# Patient Record
Sex: Female | Born: 1965 | Race: Black or African American | Hispanic: No | Marital: Single | Smoking: Former smoker
Health system: Southern US, Community
[De-identification: ages and names within clinical notes are randomized; demographics above are authoritative.]

## PROBLEM LIST (undated history)

## (undated) DIAGNOSIS — Z9889 Other specified postprocedural states: Secondary | ICD-10-CM

## (undated) DIAGNOSIS — F32A Depression, unspecified: Secondary | ICD-10-CM

## (undated) DIAGNOSIS — M21949 Unspecified acquired deformity of hand, unspecified hand: Secondary | ICD-10-CM

## (undated) DIAGNOSIS — L509 Urticaria, unspecified: Secondary | ICD-10-CM

## (undated) DIAGNOSIS — R112 Nausea with vomiting, unspecified: Secondary | ICD-10-CM

## (undated) DIAGNOSIS — R600 Localized edema: Secondary | ICD-10-CM

## (undated) DIAGNOSIS — Q6689 Other  specified congenital deformities of feet: Secondary | ICD-10-CM

## (undated) DIAGNOSIS — Z889 Allergy status to unspecified drugs, medicaments and biological substances status: Secondary | ICD-10-CM

## (undated) DIAGNOSIS — S143XXA Injury of brachial plexus, initial encounter: Secondary | ICD-10-CM

## (undated) DIAGNOSIS — E785 Hyperlipidemia, unspecified: Secondary | ICD-10-CM

## (undated) DIAGNOSIS — Z972 Presence of dental prosthetic device (complete) (partial): Secondary | ICD-10-CM

## (undated) DIAGNOSIS — L309 Dermatitis, unspecified: Secondary | ICD-10-CM

## (undated) DIAGNOSIS — I2510 Atherosclerotic heart disease of native coronary artery without angina pectoris: Secondary | ICD-10-CM

## (undated) DIAGNOSIS — F419 Anxiety disorder, unspecified: Secondary | ICD-10-CM

## (undated) DIAGNOSIS — F329 Major depressive disorder, single episode, unspecified: Secondary | ICD-10-CM

## (undated) DIAGNOSIS — M5090 Cervical disc disorder, unspecified, unspecified cervical region: Secondary | ICD-10-CM

## (undated) DIAGNOSIS — I10 Essential (primary) hypertension: Secondary | ICD-10-CM

## (undated) DIAGNOSIS — G8929 Other chronic pain: Secondary | ICD-10-CM

## (undated) HISTORY — PX: MULTIPLE TOOTH EXTRACTIONS: SHX2053

## (undated) HISTORY — PX: CARDIAC CATHETERIZATION: SHX172

## (undated) HISTORY — PX: RIB RESECTION: SHX5077

## (undated) HISTORY — PX: SHOULDER SURGERY: SHX246

## (undated) HISTORY — PX: BRACHIAL PLEXUS EXPLORATION: SHX1261

## (undated) HISTORY — PX: TOOTH EXTRACTION: SUR596

## (undated) HISTORY — DX: Urticaria, unspecified: L50.9

## (undated) HISTORY — DX: Dermatitis, unspecified: L30.9

---

## 1998-04-26 ENCOUNTER — Ambulatory Visit (HOSPITAL_COMMUNITY): Admission: RE | Admit: 1998-04-26 | Discharge: 1998-04-26 | Payer: Self-pay | Admitting: Orthopedic Surgery

## 1998-06-22 ENCOUNTER — Inpatient Hospital Stay (HOSPITAL_COMMUNITY): Admission: RE | Admit: 1998-06-22 | Discharge: 1998-06-23 | Payer: Self-pay | Admitting: Neurosurgery

## 1998-06-22 ENCOUNTER — Encounter: Payer: Self-pay | Admitting: Neurosurgery

## 1998-09-11 HISTORY — PX: OVARIAN CYST REMOVAL: SHX89

## 2000-05-17 ENCOUNTER — Emergency Department (HOSPITAL_COMMUNITY): Admission: EM | Admit: 2000-05-17 | Discharge: 2000-05-17 | Payer: Self-pay | Admitting: Emergency Medicine

## 2000-06-17 ENCOUNTER — Emergency Department (HOSPITAL_COMMUNITY): Admission: EM | Admit: 2000-06-17 | Discharge: 2000-06-17 | Payer: Self-pay | Admitting: Emergency Medicine

## 2000-06-24 ENCOUNTER — Emergency Department (HOSPITAL_COMMUNITY): Admission: EM | Admit: 2000-06-24 | Discharge: 2000-06-24 | Payer: Self-pay | Admitting: Emergency Medicine

## 2000-06-24 ENCOUNTER — Encounter: Payer: Self-pay | Admitting: Emergency Medicine

## 2000-08-14 ENCOUNTER — Emergency Department (HOSPITAL_COMMUNITY): Admission: EM | Admit: 2000-08-14 | Discharge: 2000-08-14 | Payer: Self-pay | Admitting: Emergency Medicine

## 2001-06-04 ENCOUNTER — Emergency Department (HOSPITAL_COMMUNITY): Admission: EM | Admit: 2001-06-04 | Discharge: 2001-06-04 | Payer: Self-pay | Admitting: Emergency Medicine

## 2002-01-04 ENCOUNTER — Emergency Department (HOSPITAL_COMMUNITY): Admission: EM | Admit: 2002-01-04 | Discharge: 2002-01-04 | Payer: Self-pay | Admitting: Emergency Medicine

## 2002-01-04 ENCOUNTER — Encounter: Payer: Self-pay | Admitting: Emergency Medicine

## 2002-01-23 ENCOUNTER — Emergency Department (HOSPITAL_COMMUNITY): Admission: EM | Admit: 2002-01-23 | Discharge: 2002-01-23 | Payer: Self-pay

## 2002-05-15 ENCOUNTER — Encounter: Admission: RE | Admit: 2002-05-15 | Discharge: 2002-05-15 | Payer: Self-pay | Admitting: Obstetrics and Gynecology

## 2002-05-19 ENCOUNTER — Ambulatory Visit (HOSPITAL_COMMUNITY): Admission: RE | Admit: 2002-05-19 | Discharge: 2002-05-19 | Payer: Self-pay

## 2002-06-05 ENCOUNTER — Encounter: Admission: RE | Admit: 2002-06-05 | Discharge: 2002-06-05 | Payer: Self-pay | Admitting: Obstetrics and Gynecology

## 2002-07-10 ENCOUNTER — Encounter: Admission: RE | Admit: 2002-07-10 | Discharge: 2002-07-10 | Payer: Self-pay | Admitting: Obstetrics and Gynecology

## 2002-07-23 ENCOUNTER — Ambulatory Visit (HOSPITAL_COMMUNITY): Admission: RE | Admit: 2002-07-23 | Discharge: 2002-07-23 | Payer: Self-pay | Admitting: Obstetrics and Gynecology

## 2002-10-20 ENCOUNTER — Emergency Department (HOSPITAL_COMMUNITY): Admission: EM | Admit: 2002-10-20 | Discharge: 2002-10-20 | Payer: Self-pay | Admitting: Emergency Medicine

## 2002-11-03 ENCOUNTER — Encounter: Payer: Self-pay | Admitting: Emergency Medicine

## 2002-11-03 ENCOUNTER — Emergency Department (HOSPITAL_COMMUNITY): Admission: EM | Admit: 2002-11-03 | Discharge: 2002-11-03 | Payer: Self-pay | Admitting: Emergency Medicine

## 2002-12-10 ENCOUNTER — Emergency Department (HOSPITAL_COMMUNITY): Admission: EM | Admit: 2002-12-10 | Discharge: 2002-12-10 | Payer: Self-pay | Admitting: Emergency Medicine

## 2002-12-30 ENCOUNTER — Encounter: Payer: Self-pay | Admitting: Family Medicine

## 2002-12-30 ENCOUNTER — Ambulatory Visit (HOSPITAL_COMMUNITY): Admission: RE | Admit: 2002-12-30 | Discharge: 2002-12-30 | Payer: Self-pay | Admitting: Family Medicine

## 2003-02-23 ENCOUNTER — Encounter: Payer: Self-pay | Admitting: Family Medicine

## 2003-02-23 ENCOUNTER — Ambulatory Visit (HOSPITAL_COMMUNITY): Admission: RE | Admit: 2003-02-23 | Discharge: 2003-02-23 | Payer: Self-pay | Admitting: Family Medicine

## 2003-06-27 ENCOUNTER — Emergency Department (HOSPITAL_COMMUNITY): Admission: AD | Admit: 2003-06-27 | Discharge: 2003-06-27 | Payer: Self-pay | Admitting: Emergency Medicine

## 2003-07-25 ENCOUNTER — Emergency Department (HOSPITAL_COMMUNITY): Admission: EM | Admit: 2003-07-25 | Discharge: 2003-07-25 | Payer: Self-pay | Admitting: Emergency Medicine

## 2004-04-01 ENCOUNTER — Emergency Department (HOSPITAL_COMMUNITY): Admission: EM | Admit: 2004-04-01 | Discharge: 2004-04-01 | Payer: Self-pay | Admitting: *Deleted

## 2004-04-12 ENCOUNTER — Emergency Department (HOSPITAL_COMMUNITY): Admission: EM | Admit: 2004-04-12 | Discharge: 2004-04-13 | Payer: Self-pay | Admitting: Emergency Medicine

## 2004-05-23 ENCOUNTER — Ambulatory Visit (HOSPITAL_COMMUNITY): Admission: RE | Admit: 2004-05-23 | Discharge: 2004-05-23 | Payer: Self-pay | Admitting: Neurosurgery

## 2004-05-26 ENCOUNTER — Emergency Department (HOSPITAL_COMMUNITY): Admission: EM | Admit: 2004-05-26 | Discharge: 2004-05-26 | Payer: Self-pay | Admitting: Emergency Medicine

## 2004-06-10 ENCOUNTER — Encounter: Admission: RE | Admit: 2004-06-10 | Discharge: 2004-09-08 | Payer: Self-pay | Admitting: Neurosurgery

## 2004-06-21 ENCOUNTER — Ambulatory Visit: Payer: Self-pay | Admitting: Obstetrics and Gynecology

## 2005-01-02 ENCOUNTER — Ambulatory Visit: Payer: Self-pay | Admitting: Internal Medicine

## 2005-01-09 ENCOUNTER — Ambulatory Visit: Payer: Self-pay | Admitting: Family Medicine

## 2005-01-10 ENCOUNTER — Ambulatory Visit (HOSPITAL_COMMUNITY): Admission: RE | Admit: 2005-01-10 | Discharge: 2005-01-10 | Payer: Self-pay | Admitting: Family Medicine

## 2005-01-10 ENCOUNTER — Ambulatory Visit: Payer: Self-pay | Admitting: *Deleted

## 2005-02-14 ENCOUNTER — Ambulatory Visit: Payer: Self-pay | Admitting: Family Medicine

## 2005-02-21 ENCOUNTER — Ambulatory Visit (HOSPITAL_COMMUNITY): Admission: RE | Admit: 2005-02-21 | Discharge: 2005-02-21 | Payer: Self-pay | Admitting: Family Medicine

## 2005-02-25 ENCOUNTER — Emergency Department (HOSPITAL_COMMUNITY): Admission: EM | Admit: 2005-02-25 | Discharge: 2005-02-25 | Payer: Self-pay | Admitting: Family Medicine

## 2005-03-28 ENCOUNTER — Ambulatory Visit: Payer: Self-pay | Admitting: Family Medicine

## 2005-04-26 ENCOUNTER — Emergency Department (HOSPITAL_COMMUNITY): Admission: EM | Admit: 2005-04-26 | Discharge: 2005-04-26 | Payer: Self-pay | Admitting: Emergency Medicine

## 2005-04-27 ENCOUNTER — Ambulatory Visit: Payer: Self-pay | Admitting: Family Medicine

## 2005-04-28 ENCOUNTER — Ambulatory Visit: Payer: Self-pay | Admitting: Family Medicine

## 2005-04-28 ENCOUNTER — Ambulatory Visit (HOSPITAL_COMMUNITY): Admission: RE | Admit: 2005-04-28 | Discharge: 2005-04-28 | Payer: Self-pay | Admitting: Family Medicine

## 2005-04-29 ENCOUNTER — Emergency Department (HOSPITAL_COMMUNITY): Admission: EM | Admit: 2005-04-29 | Discharge: 2005-04-29 | Payer: Self-pay | Admitting: Emergency Medicine

## 2005-05-03 ENCOUNTER — Ambulatory Visit: Payer: Self-pay | Admitting: Family Medicine

## 2005-05-05 ENCOUNTER — Ambulatory Visit: Payer: Self-pay | Admitting: Family Medicine

## 2005-05-18 ENCOUNTER — Ambulatory Visit (HOSPITAL_COMMUNITY): Admission: RE | Admit: 2005-05-18 | Discharge: 2005-05-18 | Payer: Self-pay | Admitting: Family Medicine

## 2005-05-19 ENCOUNTER — Ambulatory Visit: Payer: Self-pay | Admitting: Family Medicine

## 2005-05-24 ENCOUNTER — Ambulatory Visit: Payer: Self-pay | Admitting: Family Medicine

## 2005-09-13 ENCOUNTER — Ambulatory Visit: Payer: Self-pay | Admitting: Family Medicine

## 2005-10-22 ENCOUNTER — Ambulatory Visit (HOSPITAL_COMMUNITY): Admission: RE | Admit: 2005-10-22 | Discharge: 2005-10-22 | Payer: Self-pay | Admitting: Neurosurgery

## 2005-11-10 ENCOUNTER — Ambulatory Visit: Payer: Self-pay | Admitting: Family Medicine

## 2005-11-14 ENCOUNTER — Ambulatory Visit (HOSPITAL_COMMUNITY): Admission: RE | Admit: 2005-11-14 | Discharge: 2005-11-14 | Payer: Self-pay | Admitting: Family Medicine

## 2005-12-20 ENCOUNTER — Emergency Department (HOSPITAL_COMMUNITY): Admission: EM | Admit: 2005-12-20 | Discharge: 2005-12-21 | Payer: Self-pay | Admitting: Emergency Medicine

## 2005-12-27 ENCOUNTER — Ambulatory Visit: Payer: Self-pay | Admitting: Family Medicine

## 2006-01-09 ENCOUNTER — Encounter: Admission: RE | Admit: 2006-01-09 | Discharge: 2006-02-01 | Payer: Self-pay | Admitting: Family Medicine

## 2006-02-01 ENCOUNTER — Ambulatory Visit: Payer: Self-pay | Admitting: Family Medicine

## 2006-03-02 ENCOUNTER — Ambulatory Visit: Payer: Self-pay | Admitting: Family Medicine

## 2006-05-29 ENCOUNTER — Encounter: Admission: RE | Admit: 2006-05-29 | Discharge: 2006-05-29 | Payer: Self-pay | Admitting: Family Medicine

## 2006-06-26 ENCOUNTER — Encounter
Admission: RE | Admit: 2006-06-26 | Discharge: 2006-09-24 | Payer: Self-pay | Admitting: Physical Medicine & Rehabilitation

## 2006-06-26 ENCOUNTER — Ambulatory Visit: Payer: Self-pay | Admitting: Physical Medicine & Rehabilitation

## 2006-07-24 ENCOUNTER — Ambulatory Visit: Payer: Self-pay | Admitting: Physical Medicine & Rehabilitation

## 2006-08-21 ENCOUNTER — Ambulatory Visit: Payer: Self-pay | Admitting: Family Medicine

## 2006-09-19 ENCOUNTER — Ambulatory Visit: Payer: Self-pay | Admitting: Physical Medicine & Rehabilitation

## 2006-10-03 ENCOUNTER — Emergency Department (HOSPITAL_COMMUNITY): Admission: EM | Admit: 2006-10-03 | Discharge: 2006-10-04 | Payer: Self-pay | Admitting: Emergency Medicine

## 2006-10-09 ENCOUNTER — Ambulatory Visit: Payer: Self-pay | Admitting: Family Medicine

## 2006-11-14 ENCOUNTER — Encounter
Admission: RE | Admit: 2006-11-14 | Discharge: 2007-02-12 | Payer: Self-pay | Admitting: Physical Medicine & Rehabilitation

## 2006-11-14 ENCOUNTER — Ambulatory Visit: Payer: Self-pay | Admitting: Physical Medicine & Rehabilitation

## 2007-02-08 ENCOUNTER — Encounter
Admission: RE | Admit: 2007-02-08 | Discharge: 2007-05-09 | Payer: Self-pay | Admitting: Physical Medicine & Rehabilitation

## 2007-02-08 ENCOUNTER — Ambulatory Visit: Payer: Self-pay | Admitting: Physical Medicine & Rehabilitation

## 2007-03-26 DIAGNOSIS — D259 Leiomyoma of uterus, unspecified: Secondary | ICD-10-CM

## 2007-03-26 DIAGNOSIS — G43009 Migraine without aura, not intractable, without status migrainosus: Secondary | ICD-10-CM | POA: Insufficient documentation

## 2007-03-26 DIAGNOSIS — N83209 Unspecified ovarian cyst, unspecified side: Secondary | ICD-10-CM

## 2007-03-26 DIAGNOSIS — Q765 Cervical rib: Secondary | ICD-10-CM | POA: Insufficient documentation

## 2007-03-26 DIAGNOSIS — M5030 Other cervical disc degeneration, unspecified cervical region: Secondary | ICD-10-CM

## 2007-03-26 DIAGNOSIS — F17200 Nicotine dependence, unspecified, uncomplicated: Secondary | ICD-10-CM | POA: Insufficient documentation

## 2007-03-26 DIAGNOSIS — M5137 Other intervertebral disc degeneration, lumbosacral region: Secondary | ICD-10-CM

## 2007-03-28 ENCOUNTER — Emergency Department (HOSPITAL_COMMUNITY): Admission: EM | Admit: 2007-03-28 | Discharge: 2007-03-28 | Payer: Self-pay | Admitting: Emergency Medicine

## 2007-04-15 ENCOUNTER — Ambulatory Visit: Payer: Self-pay | Admitting: Family Medicine

## 2007-04-18 ENCOUNTER — Ambulatory Visit (HOSPITAL_COMMUNITY): Admission: RE | Admit: 2007-04-18 | Discharge: 2007-04-18 | Payer: Self-pay | Admitting: Family Medicine

## 2007-04-23 ENCOUNTER — Emergency Department (HOSPITAL_COMMUNITY): Admission: EM | Admit: 2007-04-23 | Discharge: 2007-04-23 | Payer: Self-pay | Admitting: Emergency Medicine

## 2007-05-09 ENCOUNTER — Ambulatory Visit: Payer: Self-pay | Admitting: Physical Medicine & Rehabilitation

## 2007-05-09 ENCOUNTER — Encounter
Admission: RE | Admit: 2007-05-09 | Discharge: 2007-05-10 | Payer: Self-pay | Admitting: Physical Medicine & Rehabilitation

## 2007-05-16 ENCOUNTER — Ambulatory Visit (HOSPITAL_COMMUNITY)
Admission: RE | Admit: 2007-05-16 | Discharge: 2007-05-16 | Payer: Self-pay | Admitting: Physical Medicine & Rehabilitation

## 2007-05-19 ENCOUNTER — Emergency Department (HOSPITAL_COMMUNITY): Admission: EM | Admit: 2007-05-19 | Discharge: 2007-05-19 | Payer: Self-pay | Admitting: Emergency Medicine

## 2007-05-31 ENCOUNTER — Encounter: Admission: RE | Admit: 2007-05-31 | Discharge: 2007-05-31 | Payer: Self-pay | Admitting: Family Medicine

## 2007-06-17 ENCOUNTER — Ambulatory Visit (HOSPITAL_COMMUNITY): Admission: RE | Admit: 2007-06-17 | Discharge: 2007-06-17 | Payer: Self-pay | Admitting: Obstetrics & Gynecology

## 2007-07-04 ENCOUNTER — Ambulatory Visit: Payer: Self-pay | Admitting: Physical Medicine & Rehabilitation

## 2007-07-04 ENCOUNTER — Encounter
Admission: RE | Admit: 2007-07-04 | Discharge: 2007-07-16 | Payer: Self-pay | Admitting: Physical Medicine & Rehabilitation

## 2007-07-09 ENCOUNTER — Ambulatory Visit: Payer: Self-pay | Admitting: Gastroenterology

## 2007-07-09 LAB — CONVERTED CEMR LAB
AST: 16 units/L (ref 0–37)
Albumin: 3.8 g/dL (ref 3.5–5.2)
Alkaline Phosphatase: 44 units/L (ref 39–117)
BUN: 5 mg/dL — ABNORMAL LOW (ref 6–23)
Basophils Absolute: 0.1 10*3/uL (ref 0.0–0.1)
Chloride: 110 meq/L (ref 96–112)
Creatinine, Ser: 0.7 mg/dL (ref 0.4–1.2)
Eosinophils Absolute: 0.1 10*3/uL (ref 0.0–0.6)
GFR calc non Af Amer: 98 mL/min
MCHC: 34.2 g/dL (ref 30.0–36.0)
MCV: 87.9 fL (ref 78.0–100.0)
Monocytes Relative: 7.7 % (ref 3.0–11.0)
Potassium: 3.6 meq/L (ref 3.5–5.1)
RBC: 4.27 M/uL (ref 3.87–5.11)
RDW: 11.8 % (ref 11.5–14.6)
Sed Rate: 11 mm/hr (ref 0–25)
Sodium: 142 meq/L (ref 135–145)
TSH: 0.94 microintl units/mL (ref 0.35–5.50)
Total Bilirubin: 0.5 mg/dL (ref 0.3–1.2)

## 2007-07-15 ENCOUNTER — Ambulatory Visit: Payer: Self-pay | Admitting: Cardiology

## 2007-07-16 ENCOUNTER — Encounter: Payer: Self-pay | Admitting: Obstetrics & Gynecology

## 2007-07-19 ENCOUNTER — Ambulatory Visit: Payer: Self-pay | Admitting: Gastroenterology

## 2007-08-26 ENCOUNTER — Ambulatory Visit: Payer: Self-pay | Admitting: Family Medicine

## 2007-09-13 ENCOUNTER — Ambulatory Visit: Payer: Self-pay | Admitting: Gastroenterology

## 2007-09-20 ENCOUNTER — Encounter
Admission: RE | Admit: 2007-09-20 | Discharge: 2007-09-30 | Payer: Self-pay | Admitting: Physical Medicine & Rehabilitation

## 2007-09-27 ENCOUNTER — Ambulatory Visit: Payer: Self-pay | Admitting: Physical Medicine & Rehabilitation

## 2007-10-10 ENCOUNTER — Encounter
Admission: RE | Admit: 2007-10-10 | Discharge: 2007-11-07 | Payer: Self-pay | Admitting: Physical Medicine & Rehabilitation

## 2007-10-17 ENCOUNTER — Ambulatory Visit: Payer: Self-pay | Admitting: Family Medicine

## 2007-10-17 LAB — CONVERTED CEMR LAB
HCV Ab: NEGATIVE
Hep A Total Ab: POSITIVE — AB
Hep B S Ab: POSITIVE — AB

## 2007-10-24 ENCOUNTER — Emergency Department (HOSPITAL_COMMUNITY): Admission: EM | Admit: 2007-10-24 | Discharge: 2007-10-24 | Payer: Self-pay | Admitting: Emergency Medicine

## 2007-10-25 ENCOUNTER — Ambulatory Visit: Payer: Self-pay | Admitting: Family Medicine

## 2007-11-05 DIAGNOSIS — F411 Generalized anxiety disorder: Secondary | ICD-10-CM | POA: Insufficient documentation

## 2007-11-05 DIAGNOSIS — R110 Nausea: Secondary | ICD-10-CM

## 2007-11-05 DIAGNOSIS — F329 Major depressive disorder, single episode, unspecified: Secondary | ICD-10-CM

## 2007-12-17 ENCOUNTER — Encounter
Admission: RE | Admit: 2007-12-17 | Discharge: 2007-12-17 | Payer: Self-pay | Admitting: Physical Medicine & Rehabilitation

## 2007-12-19 ENCOUNTER — Ambulatory Visit: Payer: Self-pay | Admitting: Physical Medicine & Rehabilitation

## 2007-12-25 ENCOUNTER — Ambulatory Visit: Payer: Self-pay | Admitting: Family Medicine

## 2007-12-25 LAB — CONVERTED CEMR LAB
CO2: 23 meq/L (ref 19–32)
Calcium: 8.8 mg/dL (ref 8.4–10.5)
Creatinine, Ser: 0.73 mg/dL (ref 0.40–1.20)
Glucose, Bld: 82 mg/dL (ref 70–99)
Sodium: 142 meq/L (ref 135–145)

## 2008-01-10 ENCOUNTER — Ambulatory Visit: Payer: Self-pay | Admitting: Internal Medicine

## 2008-02-28 ENCOUNTER — Emergency Department (HOSPITAL_COMMUNITY): Admission: EM | Admit: 2008-02-28 | Discharge: 2008-02-28 | Payer: Self-pay | Admitting: Family Medicine

## 2008-03-10 ENCOUNTER — Emergency Department (HOSPITAL_COMMUNITY): Admission: EM | Admit: 2008-03-10 | Discharge: 2008-03-10 | Payer: Self-pay | Admitting: Emergency Medicine

## 2008-03-26 ENCOUNTER — Ambulatory Visit: Payer: Self-pay | Admitting: Family Medicine

## 2008-03-26 LAB — CONVERTED CEMR LAB: Uric Acid, Serum: 3.4 mg/dL (ref 2.4–7.0)

## 2008-03-27 ENCOUNTER — Ambulatory Visit (HOSPITAL_COMMUNITY): Admission: RE | Admit: 2008-03-27 | Discharge: 2008-03-27 | Payer: Self-pay | Admitting: Family Medicine

## 2008-04-07 ENCOUNTER — Encounter
Admission: RE | Admit: 2008-04-07 | Discharge: 2008-04-08 | Payer: Self-pay | Admitting: Physical Medicine & Rehabilitation

## 2008-04-08 ENCOUNTER — Ambulatory Visit: Payer: Self-pay | Admitting: Physical Medicine & Rehabilitation

## 2008-04-13 ENCOUNTER — Emergency Department (HOSPITAL_COMMUNITY): Admission: EM | Admit: 2008-04-13 | Discharge: 2008-04-13 | Payer: Self-pay | Admitting: *Deleted

## 2008-04-27 ENCOUNTER — Ambulatory Visit: Payer: Self-pay | Admitting: Family Medicine

## 2008-04-27 LAB — CONVERTED CEMR LAB
ALT: 12 units/L (ref 0–35)
BUN: 9 mg/dL (ref 6–23)
CO2: 20 meq/L (ref 19–32)
Calcium: 9.2 mg/dL (ref 8.4–10.5)
Chloride: 109 meq/L (ref 96–112)
Creatinine, Ser: 0.86 mg/dL (ref 0.40–1.20)
Glucose, Bld: 91 mg/dL (ref 70–99)
Total Bilirubin: 0.5 mg/dL (ref 0.3–1.2)

## 2008-06-01 ENCOUNTER — Encounter: Admission: RE | Admit: 2008-06-01 | Discharge: 2008-06-01 | Payer: Self-pay | Admitting: Family Medicine

## 2008-06-26 ENCOUNTER — Ambulatory Visit: Payer: Self-pay | Admitting: Internal Medicine

## 2008-07-08 ENCOUNTER — Encounter
Admission: RE | Admit: 2008-07-08 | Discharge: 2008-07-10 | Payer: Self-pay | Admitting: Physical Medicine & Rehabilitation

## 2008-07-10 ENCOUNTER — Ambulatory Visit: Payer: Self-pay | Admitting: Physical Medicine & Rehabilitation

## 2008-08-10 ENCOUNTER — Encounter (INDEPENDENT_AMBULATORY_CARE_PROVIDER_SITE_OTHER): Payer: Self-pay | Admitting: Family Medicine

## 2008-08-10 ENCOUNTER — Ambulatory Visit: Payer: Self-pay | Admitting: Internal Medicine

## 2008-08-10 LAB — CONVERTED CEMR LAB
CO2: 22 meq/L (ref 19–32)
Calcium: 8.9 mg/dL (ref 8.4–10.5)
Chloride: 106 meq/L (ref 96–112)
Glucose, Bld: 82 mg/dL (ref 70–99)
Potassium: 3.9 meq/L (ref 3.5–5.3)
Sodium: 139 meq/L (ref 135–145)

## 2008-10-02 ENCOUNTER — Encounter: Payer: Self-pay | Admitting: Obstetrics & Gynecology

## 2008-10-02 ENCOUNTER — Encounter
Admission: RE | Admit: 2008-10-02 | Discharge: 2008-10-28 | Payer: Self-pay | Admitting: Physical Medicine & Rehabilitation

## 2008-10-02 ENCOUNTER — Ambulatory Visit (HOSPITAL_COMMUNITY): Admission: RE | Admit: 2008-10-02 | Discharge: 2008-10-02 | Payer: Self-pay | Admitting: Obstetrics & Gynecology

## 2008-10-02 HISTORY — PX: DIAGNOSTIC LAPAROSCOPY: SUR761

## 2008-10-05 ENCOUNTER — Ambulatory Visit: Payer: Self-pay | Admitting: Physical Medicine & Rehabilitation

## 2008-10-13 ENCOUNTER — Ambulatory Visit: Payer: Self-pay | Admitting: Internal Medicine

## 2008-10-22 ENCOUNTER — Ambulatory Visit: Payer: Self-pay | Admitting: Family Medicine

## 2008-10-28 ENCOUNTER — Encounter
Admission: RE | Admit: 2008-10-28 | Discharge: 2008-11-04 | Payer: Self-pay | Admitting: Physical Medicine & Rehabilitation

## 2008-11-04 ENCOUNTER — Ambulatory Visit: Payer: Self-pay | Admitting: Physical Medicine & Rehabilitation

## 2008-11-26 ENCOUNTER — Ambulatory Visit: Payer: Self-pay | Admitting: *Deleted

## 2008-11-26 ENCOUNTER — Inpatient Hospital Stay (HOSPITAL_COMMUNITY): Admission: RE | Admit: 2008-11-26 | Discharge: 2008-11-30 | Payer: Self-pay | Admitting: *Deleted

## 2009-01-05 ENCOUNTER — Ambulatory Visit: Payer: Self-pay | Admitting: Family Medicine

## 2009-02-24 ENCOUNTER — Emergency Department (HOSPITAL_COMMUNITY): Admission: EM | Admit: 2009-02-24 | Discharge: 2009-02-24 | Payer: Self-pay | Admitting: Family Medicine

## 2009-02-27 ENCOUNTER — Emergency Department (HOSPITAL_COMMUNITY): Admission: EM | Admit: 2009-02-27 | Discharge: 2009-02-27 | Payer: Self-pay | Admitting: Emergency Medicine

## 2009-03-07 ENCOUNTER — Emergency Department (HOSPITAL_COMMUNITY): Admission: EM | Admit: 2009-03-07 | Discharge: 2009-03-07 | Payer: Self-pay | Admitting: Emergency Medicine

## 2009-03-12 ENCOUNTER — Ambulatory Visit: Payer: Self-pay | Admitting: Family Medicine

## 2009-03-12 LAB — CONVERTED CEMR LAB
Albumin: 4.2 g/dL (ref 3.5–5.2)
Alkaline Phosphatase: 48 units/L (ref 39–117)
Calcium: 8.8 mg/dL (ref 8.4–10.5)
Chloride: 107 meq/L (ref 96–112)
Glucose, Bld: 88 mg/dL (ref 70–99)
Potassium: 4 meq/L (ref 3.5–5.3)
Sodium: 139 meq/L (ref 135–145)
Total Protein: 6.9 g/dL (ref 6.0–8.3)

## 2009-04-05 ENCOUNTER — Ambulatory Visit: Payer: Self-pay | Admitting: Internal Medicine

## 2009-04-29 ENCOUNTER — Ambulatory Visit: Payer: Self-pay | Admitting: Family Medicine

## 2009-04-29 LAB — CONVERTED CEMR LAB: Sed Rate: 2 mm/hr (ref 0–22)

## 2009-05-19 ENCOUNTER — Encounter (INDEPENDENT_AMBULATORY_CARE_PROVIDER_SITE_OTHER): Payer: Self-pay | Admitting: Family Medicine

## 2009-05-19 ENCOUNTER — Ambulatory Visit (HOSPITAL_COMMUNITY): Admission: RE | Admit: 2009-05-19 | Discharge: 2009-05-19 | Payer: Self-pay | Admitting: Family Medicine

## 2009-05-19 ENCOUNTER — Ambulatory Visit: Payer: Self-pay | Admitting: Vascular Surgery

## 2009-06-03 ENCOUNTER — Encounter: Admission: RE | Admit: 2009-06-03 | Discharge: 2009-06-03 | Payer: Self-pay | Admitting: Family Medicine

## 2009-12-11 ENCOUNTER — Emergency Department (HOSPITAL_COMMUNITY): Admission: EM | Admit: 2009-12-11 | Discharge: 2009-12-11 | Payer: Self-pay | Admitting: Family Medicine

## 2009-12-14 ENCOUNTER — Ambulatory Visit: Payer: Self-pay | Admitting: Family Medicine

## 2009-12-16 ENCOUNTER — Ambulatory Visit (HOSPITAL_COMMUNITY): Admission: RE | Admit: 2009-12-16 | Discharge: 2009-12-16 | Payer: Self-pay | Admitting: Family Medicine

## 2010-01-06 ENCOUNTER — Ambulatory Visit: Payer: Self-pay | Admitting: Family Medicine

## 2010-01-06 LAB — CONVERTED CEMR LAB
ALT: 10 units/L (ref 0–35)
AST: 14 units/L (ref 0–37)
Albumin: 4 g/dL (ref 3.5–5.2)
Alkaline Phosphatase: 50 units/L (ref 39–117)
Bilirubin, Direct: 0.1 mg/dL (ref 0.0–0.3)
Total Bilirubin: 0.4 mg/dL (ref 0.3–1.2)

## 2010-01-14 ENCOUNTER — Ambulatory Visit (HOSPITAL_COMMUNITY): Admission: RE | Admit: 2010-01-14 | Discharge: 2010-01-14 | Payer: Self-pay | Admitting: Family Medicine

## 2010-02-03 ENCOUNTER — Ambulatory Visit: Payer: Self-pay | Admitting: Family Medicine

## 2010-02-12 ENCOUNTER — Emergency Department (HOSPITAL_COMMUNITY): Admission: EM | Admit: 2010-02-12 | Discharge: 2010-02-12 | Payer: Self-pay | Admitting: Podiatry

## 2010-02-24 ENCOUNTER — Emergency Department (HOSPITAL_COMMUNITY): Admission: EM | Admit: 2010-02-24 | Discharge: 2010-02-24 | Payer: Self-pay | Admitting: Emergency Medicine

## 2010-03-15 ENCOUNTER — Ambulatory Visit: Payer: Self-pay | Admitting: Family Medicine

## 2010-03-16 ENCOUNTER — Encounter (INDEPENDENT_AMBULATORY_CARE_PROVIDER_SITE_OTHER): Payer: Self-pay | Admitting: Family Medicine

## 2010-03-30 ENCOUNTER — Encounter: Admission: RE | Admit: 2010-03-30 | Discharge: 2010-05-09 | Payer: Self-pay | Admitting: Neurosurgery

## 2010-05-09 ENCOUNTER — Emergency Department (HOSPITAL_COMMUNITY): Admission: EM | Admit: 2010-05-09 | Discharge: 2010-05-09 | Payer: Self-pay | Admitting: Emergency Medicine

## 2010-05-13 ENCOUNTER — Ambulatory Visit: Payer: Self-pay | Admitting: Family Medicine

## 2010-05-13 LAB — CONVERTED CEMR LAB
BUN: 9 mg/dL
Basophils Absolute: 0 K/uL
Basophils Relative: 0 %
CO2: 26 meq/L
Calcium: 8.9 mg/dL
Chloride: 105 meq/L
Creatinine, Ser: 0.87 mg/dL
Eosinophils Absolute: 0.1 K/uL
Eosinophils Relative: 2 %
Glucose, Bld: 83 mg/dL
HCT: 37.4 %
Hemoglobin: 12.2 g/dL
Lymphocytes Relative: 44 %
Lymphs Abs: 2.4 K/uL
MCHC: 32.6 g/dL
MCV: 86.4 fL
Monocytes Absolute: 0.5 K/uL
Monocytes Relative: 9 %
Neutro Abs: 2.4 K/uL
Neutrophils Relative %: 45 %
Platelets: 262 K/uL
Potassium: 4 meq/L
RBC: 4.33 M/uL
RDW: 12.9 %
Sodium: 139 meq/L
Uric Acid, Serum: 4.4 mg/dL
Vit D, 25-Hydroxy: 24 ng/mL — ABNORMAL LOW
WBC: 5.5 10*3/microliter

## 2010-06-06 ENCOUNTER — Encounter: Admission: RE | Admit: 2010-06-06 | Discharge: 2010-06-06 | Payer: Self-pay | Admitting: Family Medicine

## 2010-07-30 ENCOUNTER — Emergency Department (HOSPITAL_COMMUNITY): Admission: EM | Admit: 2010-07-30 | Discharge: 2010-07-30 | Payer: Self-pay | Admitting: Emergency Medicine

## 2010-09-14 ENCOUNTER — Encounter
Admission: RE | Admit: 2010-09-14 | Discharge: 2010-10-11 | Payer: Self-pay | Source: Home / Self Care | Attending: Family Medicine | Admitting: Family Medicine

## 2010-09-30 ENCOUNTER — Encounter (INDEPENDENT_AMBULATORY_CARE_PROVIDER_SITE_OTHER): Payer: Self-pay | Admitting: Family Medicine

## 2010-09-30 LAB — CONVERTED CEMR LAB
Benzodiazepines.: NEGATIVE
Creatinine,U: 200.1 mg/dL
Marijuana Metabolite: NEGATIVE
Methadone: NEGATIVE
Propoxyphene: NEGATIVE

## 2010-10-02 ENCOUNTER — Encounter: Payer: Self-pay | Admitting: Family Medicine

## 2010-10-02 ENCOUNTER — Encounter: Payer: Self-pay | Admitting: Physical Medicine & Rehabilitation

## 2010-10-18 ENCOUNTER — Ambulatory Visit: Payer: Medicaid Other | Attending: Family Medicine | Admitting: Physical Therapy

## 2010-10-18 DIAGNOSIS — M25519 Pain in unspecified shoulder: Secondary | ICD-10-CM | POA: Insufficient documentation

## 2010-10-18 DIAGNOSIS — IMO0001 Reserved for inherently not codable concepts without codable children: Secondary | ICD-10-CM | POA: Insufficient documentation

## 2010-10-18 DIAGNOSIS — M542 Cervicalgia: Secondary | ICD-10-CM | POA: Insufficient documentation

## 2010-11-19 ENCOUNTER — Inpatient Hospital Stay (INDEPENDENT_AMBULATORY_CARE_PROVIDER_SITE_OTHER)
Admission: RE | Admit: 2010-11-19 | Discharge: 2010-11-19 | Disposition: A | Discharge status: Home / Self Care | Payer: Medicaid Other | Source: Ambulatory Visit | Attending: Family Medicine | Admitting: Family Medicine

## 2010-11-19 DIAGNOSIS — M949 Disorder of cartilage, unspecified: Secondary | ICD-10-CM

## 2010-11-19 DIAGNOSIS — L03039 Cellulitis of unspecified toe: Secondary | ICD-10-CM

## 2010-11-26 ENCOUNTER — Encounter (INDEPENDENT_AMBULATORY_CARE_PROVIDER_SITE_OTHER): Payer: Self-pay | Admitting: *Deleted

## 2010-11-26 LAB — CONVERTED CEMR LAB
Alkaline Phosphatase: 74 units/L (ref 39–117)
Glucose, Bld: 84 mg/dL (ref 70–99)
Marijuana Metabolite: NEGATIVE
Opiate Screen, Urine: NEGATIVE
Phencyclidine (PCP): NEGATIVE
Propoxyphene: NEGATIVE
Sodium: 141 meq/L (ref 135–145)
Total Bilirubin: 0.4 mg/dL (ref 0.3–1.2)
Total Protein: 7.1 g/dL (ref 6.0–8.3)

## 2010-11-28 LAB — RAPID STREP SCREEN (MED CTR MEBANE ONLY): Streptococcus, Group A Screen (Direct): NEGATIVE

## 2010-12-22 LAB — COMPREHENSIVE METABOLIC PANEL
ALT: 18 U/L (ref 0–35)
AST: 17 U/L (ref 0–37)
Albumin: 3.4 g/dL — ABNORMAL LOW (ref 3.5–5.2)
CO2: 26 mEq/L (ref 19–32)
Calcium: 8.7 mg/dL (ref 8.4–10.5)
Chloride: 100 mEq/L (ref 96–112)
GFR calc Af Amer: >60 mL/min (ref >60)
GFR calc non Af Amer: >60 mL/min (ref >60)
Sodium: 132 mEq/L — ABNORMAL LOW (ref 135–145)

## 2010-12-22 LAB — URINALYSIS, ROUTINE W REFLEX MICROSCOPIC
Glucose, UA: NEGATIVE mg/dL
Hgb urine dipstick: NEGATIVE
Ketones, ur: NEGATIVE mg/dL
Protein, ur: NEGATIVE mg/dL

## 2010-12-22 LAB — DRUGS OF ABUSE SCREEN W/O ALC, ROUTINE URINE
Benzodiazepines.: NEGATIVE
Cocaine Metabolites: NEGATIVE
Methadone: NEGATIVE
Opiate Screen, Urine: NEGATIVE
Phencyclidine (PCP): NEGATIVE

## 2010-12-22 LAB — CBC
HCT: 34.8 % — ABNORMAL LOW (ref 36.0–46.0)
Hemoglobin: 11.5 g/dL — ABNORMAL LOW (ref 12.0–15.0)
MCHC: 33 g/dL (ref 30.0–36.0)
RDW: 16.7 % — ABNORMAL HIGH (ref 11.5–15.5)

## 2010-12-22 LAB — URINE MICROSCOPIC-ADD ON

## 2010-12-22 LAB — DIFFERENTIAL
Basophils Absolute: 0 10*3/uL (ref 0.0–0.1)
Eosinophils Absolute: 0.3 10*3/uL (ref 0.0–0.7)
Lymphocytes Relative: 33 % (ref 12–46)
Lymphs Abs: 3.2 10*3/uL (ref 0.7–4.0)
Monocytes Absolute: 0.6 10*3/uL (ref 0.1–1.0)
Neutro Abs: 5.5 10*3/uL (ref 1.7–7.7)

## 2010-12-26 LAB — BASIC METABOLIC PANEL
BUN: 9 mg/dL (ref 6–23)
Chloride: 102 mEq/L (ref 96–112)
Creatinine, Ser: 0.67 mg/dL (ref 0.4–1.2)
Glucose, Bld: 81 mg/dL (ref 70–99)

## 2010-12-26 LAB — CBC
HCT: 37.6 % (ref 36.0–46.0)
Hemoglobin: 12.3 g/dL (ref 12.0–15.0)
MCHC: 32.7 g/dL (ref 30.0–36.0)
MCV: 90.9 fL (ref 78.0–100.0)
RBC: 4.13 MIL/uL (ref 3.87–5.11)
WBC: 6.2 10*3/uL (ref 4.0–10.5)

## 2011-01-24 NOTE — Assessment & Plan Note (Signed)
HISTORY OF PRESENT ILLNESS:  Wendy [name] returns to the clinic today for  follow-up evaluation.  She reports that overall she is doing fairly  well.  She has been recently told that she has sinus allergies and is  staring injections very soon.  She also has been placed on Imitrex for  migraine headaches.  She reports that she gets a fair amount of relief  from the Percocet along with the Ultram, Flexeril, and Neurontin for her  left brachial plexopathy.  She continues to wear a brace on her left  wrist.   MEDICATIONS:  Percocet 5/325 one tablet q.i.d. p.r.n. (2-4 per day);  Ultram 50 mg 1 tablet t.i.d. p.r.n.; Flexeril 10 mg b.i.d. p.r.n.;  Neurontin 400 mg q.a.m. and 800 mg at bedtime; Imitrex p.r.n.Marland Kitchen   REVIEW OF SYSTEMS:  Noncontributory.   PHYSICAL EXAMINATION:  GENERAL:  A well-appearing, slightly overweight  adult female in mild acute discomfort.  VITAL SIGNS: Vitals were not obtained in the office today.  EXTREMITIES:  She has 4+/5 strength of the bilateral upper and lower  extremities.  She wears a splint on her left wrist.  She ambulates  without any assistive device.   IMPRESSION:  1. 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  2. Lumbar degenerative disk disease per plain films, March 2007.  3. Probable cervical degenerative disk disease.   PLAN:  In the office today, we did refill the patient's Ultram along  with her Percocet each as of February 11, 2007. We will plan on seeing her in  followup in approximately 3 months' time with refills prior to that  appointment as necessary.           ______________________________  Ellwood Dense, M.D.     DC/MedQ  D:  02/11/2007 10:08:31  T:  02/11/2007 10:39:13  Job #:  [id number]

## 2011-01-24 NOTE — Assessment & Plan Note (Signed)
[facility]                         GASTROENTEROLOGY OFFICE NOTE   Wendy [name], Wendy [name]                     MRN:          [id number]  DATE:07/09/2007                            DOB:          10-Nov-1965    PRIMARY CARE PHYSICIAN:  Dr. Dow Adolph.   REASON FOR REFERRAL:  Dr. Tamela Oddi asked me to evaluate Wendy [name]  in consultation regarding nausea and abdominal pain.   HISTORY OF PRESENT ILLNESS:  Wendy [name] is a very pleasant 45 year old  woman who has had chronic nausea for 7-8 months, she says she will vomit  at least once a week.  Certain smells cause this to happen.  She does  not have any frank dysphagia.  About 3 weeks ago she had acute right  lower quadrant pain that was severe enough that she dialed 911 and went  to the emergency room.  She had a CT scan done that describes a tubular  structure extending from the cecum, felt probably to represent the  appendix but it was difficult to determine whether this was related to  the right ovary.  She had a surgical consultation and she tells me that  they said that she did not have appendicitis.  She was recommended to  have gynecologic followup which she did 3 or 4 weeks ago.  They set her  up with a transvaginal ultrasound which describes cysts in her left  ovary, right ovary was fairly normal though.  She was going to follow up  with Dr. Christell Constant today and last week but she has changed those  appointments and she will be seeing her tomorrow.   REVIEW OF SYSTEMS:  Notable for essential stable weight, is otherwise  essentially normal and is available on her nursing intake sheet.   PAST MEDICAL HISTORY:  1. Obesity.  2. Anxiety.  3. Arthritis.  4. Depression.  5. Chronic migraine headaches.   CURRENT MEDICINES:  1. Ultram t.i.d.  2. Flexeril.  3. Neurontin.  4. Cymbalta.  5. Hydroxyzine.  6. Sulindac.  7. Trazodone.  8. Percocet.  9. Imitrex.   ALLERGIES:  NO KNOWN DRUG  ALLERGIES.   SOCIAL HISTORY:  Single, lives with her father, smokes cigarettes daily,  nondrinker.   FAMILY HISTORY:  Brother had liver disease.  No colon cancer or colon  polyps.   PHYSICAL EXAMINATION:  Four foot eleven inches, 176 pounds, blood  pressure 100/60, pulse 80.  CONSTITUTIONAL:  Obese, otherwise well-appearing.  NEUROLOGIC:  Alert and oriented x3.  EYES:  Extraocular movements intact.  MOUTH:  Oropharynx moist, no lesions.  NECK:  Supple, no lymphadenopathy.  CARDIOVASCULAR:  Heart regular rate and rhythm.  LUNGS:  Clear to auscultation bilaterally.  ABDOMEN:  Soft, mildly tender in the right lower quadrant.  EXTREMITIES:  No lower extremity edema.  SKIN:  No rash or lesions on visible extremities.   ASSESSMENT:  A 45 year old woman with chronic nausea, vomiting, new  right lower quadrant pain.   First, her nausea and vomiting may be related to some of her  medications.  Cymbalta has the #1 side effect of nausea, sulindac is  a  well-known cause of nausea and dyspepsia.  This may be gastroesophageal  reflux disease related symptoms and so I have given her samples of  Nexium that she will take on a daily basis 20-30 minutes prior to her  breakfast meal.  I also arranged for her to have an  esophagogastroduodenoscopy performed at her soonest convenience.  Right  lower quadrant is probably not related to this nausea and vomiting as it  definitely sounded acute and new.  The CT scan that she had done almost  2 weeks ago did describe an abnormal structure in her right lower  quadrant and she still does have mild tenderness there.  She does not  have cysts on her right ovary as of ultrasound done 3 weeks ago.  I will  therefore arrange for her to have repeat CT scan abdomen and pelvis to  re-evaluate her for possibly appendicitis.  Will also get a basic set of  labs including a complete metabolic profile, thyroid testing and a CBC  and a sed rate.  I did not mention  above, but some of her discomforts in  her lower abdomen do seem to be improved when she moves her bowels, but  she does not have diarrhea, constipation and she does not have rectal  bleeding.   PLAN:  As outlined above.     Rachael Fee, MD  Electronically Signed    DPJ/MedQ  DD: 07/09/2007  DT: 07/09/2007  Job #: [id number]   cc:   Roseanna Rainbow, M.D.  Maurice March, M.D.

## 2011-01-24 NOTE — Assessment & Plan Note (Signed)
Wendy [name] returns to clinic today for followup evaluation.  She  reports that overall she is doing fairly well.  For some reason her  primary care physician switched her from Ultram 50 mg three times a day  to Ultram 50 mg/325 Tylenol q.i.d.  That was despite knowing that the  patient is already on Percocet 5/325 used four times a day.  I am a  little bit concerned about the excessive Tylenol and therefore I would  like to switch her back to straight Ultram in addition to her Percocet.  She does need a refill on her Neurontin and Flexeril in addition to a  new prescription for straight Ultram.   The patient does request to see an occupational and physical therapist  at [facility].  She has seen the therapist there at that office in  the past.  She would like to get a splint to provide full extension of  her fingers.  She does wear a wrist splint on the left upper extremity  secondary to brachial plexopathy.   MEDICATIONS:  1. Percocet 5/325 one tablet q.i.d. p.r.n.  2. Ultram 37.5/325 one tablet t.i.d. p.r.n.  3. Flexeril 10 mg b.i.d.  4. Neurontin 400 mg 1 tablet q.a.m. and 2 tablets nightly.  5. Imitrex p.r.n.  6. Lexapro 10 mg q. day.   REVIEW OF SYSTEMS:  Noncontributory.   PHYSICAL EXAMINATION:  Well appearing, slightly overweight, middle aged  adult female in mild acute discomfort.  Blood pressure is 123/63 with a  pulse of 77.  Respiratory rate 18.  O2 saturation 100% on room air.  The  patient has 4+/5 strength throughout the bilateral upper and lower  extremities.  She does wear a splint on her left wrist.   IMPRESSION:  1. Chronic left upper extremity pain with probable brachio plexopathy      resulting in weakness and atrophy along with pain mixed lumbar      degenerative disk disease for plane films.  2. Lumbar degenerative disk disease at L4-5 and L5-S1 without      herniation.  3. Cervical degenerative disk disease C3-C6 with stable foraminal       stenosis.   In the office today, we did refill the patient's Flexeril along with her  Neurontin.  We switched her back to Ultram 50 mg to be used one tablet  q.i.d. in place of her Ultram 37.5/325.  This will avoid excessive  Tylenol intake.  She will remain on the Percocet that has had a recent  refill on that medication.  We will plan on seeing her in followup in  approximately three months' time.  She will be referred to outpatient  physical and occupational therapy, and hopefully they can evaluate her  for a splint for her left upper extremity to provide finger extension.           ______________________________  Ellwood Dense, M.D.     DC/MedQ  D:  09/30/2007 13:52:20  T:  09/30/2007 14:53:47  Job #:  [id number]

## 2011-01-24 NOTE — Op Note (Signed)
Wendy [name], Wendy [name]              ACCOUNT NO.:  [id number]   MEDICAL RECORD NO.:  [id number]          PATIENT TYPE:  REC   LOCATION:  [facility]                          FACILITY:  [facility]   PHYSICIAN:  Roseanna Rainbow, M.D.DATE OF BIRTH:  Feb 06, 1966   DATE OF PROCEDURE:  10/02/2008  DATE OF DISCHARGE:                               OPERATIVE REPORT   PREOPERATIVE DIAGNOSIS:  Chronic pelvic pain.   POSTOPERATIVE DIAGNOSES:  Chronic pelvic pain, endometriosis, adhesions.   PROCEDURES:  Diagnostic laparoscopy, peritoneal biopsies.   SURGEON:  Roseanna Rainbow, MD   ANESTHESIA:  General endotracheal.   FINDINGS:  Multiple endometriotic implants, adhesions.   PATHOLOGY:  Peritoneal biopsies.   ESTIMATED BLOOD LOSS:  Minimal.   COMPLICATIONS:  None.   DESCRIPTION OF PROCEDURE:  The patient was taken to the operating room  with an IV running.  She was given general anesthesia and placed in the  dorsal lithotomy position and prepped and draped in usual sterile  fashion.  After a time-out had been completed, the bladder was  catheterized for 200 mL of urine.  A sterile speculum was placed in the  patient's vagina.  The anterior lip of the cervix was grasped with a  single-tooth tenaculum.  The Hulka manipulator was then advanced into  the uterus and secured to the anterior lip of the cervix.  The single-  tooth tenaculum and speculum were then removed.  An infraumbilical skin  incision was then made with the scalpel.  Using the OptiView, a trocar  and sleeve were then advanced into the abdomen.  The abdomen was then  insufflated with CO2 gas.  A survey was then made of the pelvic and  abdominal anatomy.  There was a wall of omental adhesions along the  midline from the previous laparotomy extending from near the umbilicus  and down and curving medially in the pelvis draping over the uterus.  There were filmy adhesions involving the transverse colon to the  anterior abdominal  wall.  The appendix appeared normal.  However, there  were some adhesions involving the cecum to the parietal peritoneum of  the right pelvic sidewall.  The uterus appeared slightly enlarged.  There were bilateral hydrosalpinges; however, the ovaries and distal  portions of the fallopian tubes were not well visualized secondary to  adhesions.  There were adhesions involving the sigmoid colon to the  parietal peritoneum of the posterior cul-de-sac.  There were multiple  windows involving the peritoneum of the posterior cul-de-sac.  The  uterosacral ligaments appeared thickened.  There was a fairly large  scarred plaque on the right pelvic sidewall.  In this plaque were  hemorrhagic-appearing lesions as well.  Biopsies were taken of this  plaque as well as multiple biopsies taken from the posterior cul-de-sac  adhesions.  The anterior cul-de-sac was not well visualized.  The  laparoscope was then removed.  The trocar and sleeve were then removed  as well.  The fascial incision was reapproximated with figure-  of-eight suture of 0 Vicryl.  The skin was closed with Dermabond.  The  Hulka manipulator was removed,  with minimal bleeding noted from the  cervix.  At the close of the procedure, the instrument and pack counts  were said to be correct x2.  The patient was taken to the PACU awake and  in stable condition.      Roseanna Rainbow, M.D.  Electronically Signed     LAJ/MEDQ  D:  10/02/2008  T:  10/03/2008  Job:  [id number]

## 2011-01-24 NOTE — Consult Note (Signed)
NAMEMarland [name]  Wendy, [name] NO.:  [id number]   MEDICAL RECORD NO.:  [id number]          PATIENT TYPE:  EMS   LOCATION:  ED                           FACILITY:  [facility]   PHYSICIAN:  [name], MD      DATE OF BIRTH:  [birth year]   DATE OF CONSULTATION:  05/14/2007  DATE OF DISCHARGE:  05/19/2007                                 CONSULTATION   REFERRING PHYSICIAN:  Dr. Valma Cava,  [name].   REASON FOR CONSULTATION:  Abdominal pain.   HISTORY OF PRESENT ILLNESS:  The patient is a 45 year old black female  who presents to the emergency department with a 6-hour history of sharp  right lower quadrant pain.  This had sudden onset.  It is been  persistent.  It was associated with 2 episodes of emesis at home.  The  patient denies fevers or chills.  She did have a normal bowel movement  this morning.  This is the third episode of pain she has had.  Each  episode has occurred approximately 3 weeks apart.  During her stay in  the emergency department, she was treated with Zofran for nausea.  Her  pain has now largely resolved.  Laboratory studies were performed, which  showed a normal white count, normal differential.  CT scan of abdomen  and pelvis was obtained, which demonstrated a short tubular fluid-filled  structure inferomedial to the cecum measuring approximately 12 mm in  diameter.  This continues to the region of the right adnexa and is  difficult to separate from the right ovary.  There were no inflammatory  changes.  There was a small amount of fluid in the posterior cul-de-sac.  There is no other acute intra-abdominal process was identified on CT  scan.  General Surgery is now called for evaluation and recommendations  for management.   PAST MEDICAL HISTORY:  1. Status post left cervical rib resection by Dr. Danae Orleans.  [name] for      a brachial plexus entrapment.  2. History of ovarian cystectomy by Dr. [name] in 2000.  3.  History of depression.  4. History of anxiety.  5. History of degenerative disk disease involving the cervical spine      and lumbar spine.  6. History of migraine headache.   MEDICATIONS:  Cymbalta, Flexeril, Imitrex, Neurontin, Percocet,  Sulindac, Tramadol and trazodone.   ALLERGIES:  None known.  Questionable reaction in the past to MORPHINE.   SOCIAL HISTORY:  The patient is single.  She has no children.  She does  not work.  She does smoke cigarettes.  She drinks alcohol on occasion.   FAMILY HISTORY:  Noncontributory.   REVIEW OF SYSTEMS:  Fifteen system review without other significant  finding except as noted above.   EXAM:  GENERAL:  A 45 year old moderately obese black female on a  stretcher in the emergency department, in no acute distress.  The  patient is using the telephone and moving about the room easily.  VITAL SIGNS:  Temperature 97.1, pulse 77, respirations 18 and blood  pressure  of 127/80.  O2 saturation is 99%.  HEENT:  Shows her be normocephalic, atraumatic.  Sclerae clear.  Conjunctivae clear.  Dentition fair.  Mucous membranes moist.  Voice  normal.  NECK:  Palpation of the neck shows no lymphadenopathy, no mass, no  tenderness.  Surgical wounds along the left lower neck are well-healed.  LUNGS:  Clear to auscultation bilaterally without rales, rhonchi or  wheeze.  CARDIAC:  Exam shows regular rate and rhythm without murmur.  ABDOMEN:  Soft, obese.  There are bowel sounds on auscultation.  There  is minimal tenderness to palpation of the abdominal wall; this  tenderness is mainly in the lower quadrant, slightly greater on the left  than on the right.  There is no voluntary guarding.  There is no rebound  tenderness.  There is no palpable mass.  EXTREMITIES:  Nontender without edema.  NEUROLOGICAL:  The patient is alert and oriented.  There is no sign of  tremor.   LABORATORY STUDIES:  White count 6.2, hemoglobin 11.8, hematocrit 35%,  platelet count  263,000; differential shows 75% neutrophils, 18%  lymphocytes, 6% monocytes.  Urinalysis is completely benign.  Electrolytes were normal with the exception of a potassium of 3.2.  Creatinine is normal at 0.78.   CT scan results noted above in history present illness.   IMPRESSION:  Right lower quadrant abdominal pain, doubt acute  appendicitis.  Likely right ovarian cyst, recurrent.   PLAN:  The patient will be discharge from the emergency department.  She  has Percocet and tramadol at home to take as needed for pain.  The  patient will be contacted by my office at [facility] at her home  phone number, [phone], or her cell phone number, [phone], in the  morning of Monday, September 8.  If the patient's clinical condition  continues to improve, she will be followed up in our office at [facility] on Tuesday, September 9.  If her condition deteriorates  in any way, she will return to the emergency department for assessment  again and possible laparoscopy with appendectomy.      [name], MD  Electronically Signed     TMG/MEDQ  D:  05/19/2007  T:  05/20/2007  Job:  [id number]   cc:   [facility] Valma Cava MD   Maurice March, M.D.  Fax: [phone]   [name], M.D.  Fax: [phone]   Danae Orleans. [name], M.D.  Fax: [phone]

## 2011-01-24 NOTE — Assessment & Plan Note (Signed)
[facility]                         GASTROENTEROLOGY OFFICE NOTE   Wendy [name], Wendy [name]                     MRN:          [id number]  DATE:09/13/2007                            DOB:          [birth year]    REFERRING PHYSICIAN:  Maurice March, M.D.   GI PROBLEM LIST:  1. Chronic nausea.  Symptoms began around the time she started      Cymbalta and narcotic pain medicine.  CT scan shows a corpus luteal      cyst in her right ovary.  Otherwise essentially normal.  This is an      IV and oral contrast abdominal pelvic CT scan, November 2008.  Labs      in October, 2008 showed normal CBC, normal complete metabolic      profile.  EGD, November, 2008 was normal.   INTERVAL HISTORY:  I last saw Wendy [name] two months ago at the time of her  upper endoscopy.  She changed from Cymbalta to Lexapro and the Sulindac  was stopped and she began taking Tramadol for some of her pain.  She  still does take Percocets on a daily basis.  She is still is just as  nauseous as she was before.   CURRENT MEDICATIONS:  Flexeril, Neurontin, Hydroxyzine, Tramadol.  Lexapro, prednisone , Zyrtec.   PHYSICAL EXAMINATION:  Weight:  174 pounds.  She is down 2 pounds since  her last visit.  Blood pressure:  108/68.  Pulse:  76.  CONSTITUTION:  Generally  well appearing.  ABDOMEN:  Abdomen is soft, nontender and nondistended.  Normal bowel  sounds.   IMPRESSION:  45 year old woman with chronic nausea.  I suspect  her nausea is in part due to her Percocet usage and  her antidepressant  (Lexapro's number one side effect is nausea).  Unfortunately she seems  to need both of these medicines for her other issues and so I will start  her empirically on Compazine 5 mg three times a day to see if that helps  her nausea.  She will return to see me in six to eight weeks and sooner  if needed.   RECOMMENDATION:     Wendy Fee, MD  Electronically Signed    DPJ/MedQ   DD: 09/13/2007  DT: 09/13/2007  Job #: [id number]   cc:   Maurice March, M.D.  Roseanna Rainbow, M.D.

## 2011-01-24 NOTE — Assessment & Plan Note (Signed)
Ms. Kydd returns to the clinic today for followup evaluation.  We  last saw her May 10, 2007.  We requested an MRI scan of her cervical  spine.  She did not inform us that she actually just had an MRI scan of  her cervical spine, so the repeat was not done.  The findings from the  April 18, 2007 study were stable disk protrusion at C3-4, C4-5, and C5-6  with stable mass effect of the anterior thecal sac, and foraminal  stenosis.  There was normal magnetic resonance appearance of the  cervical spinal cord.  There were prominent lymphoid tissues,  specifically adenoids and tonsils.   The patient also underwent an MRI scan of the lumbar spine May 16, 2007.  This showed no neuro-compressive pathology event.  There was mild  disk degeneration at L4-5 with bulge, but no herniation or stenosis.  There was mild facet degeneration at L4-5 and L5-S1 without slippage,  marked edematous change, or any encroachment upon the neural spaces.   The patient reports that she still has pain, especially when she gets up  in the morning, but it is somewhat eased with the medications and the  activity throughout the day.  She is under a fair amount of stress right  now as her mother is hospitalized after a myocardial infarction and  apparently not doing very well.   MEDICATIONS:  1. Percocet 5/325 one tablet q.i.d. p.r.n.  2. Ultram 50 mg 1 tablet t.i.d.  3. Flexeril 10 mg b.i.d.  4. Neurontin 400 mg 1 tablet q. a.m. and 2 tablets nightly.  5. Imitrex p.r.n.  6. Cymbalta 2 tablets nightly.   REVIEW OF SYSTEMS:  Noncontributory.   PHYSICAL EXAM:  Well-appearing slightly overweight middle-aged adult  female in mild to moderate acute discomfort.  Blood pressure 126/80 with a pulse of 104, respiratory rate 18, and O2  saturation 98% on room air.  She has 4+/5 strength throughout the bilateral upper and lower  extremities.  She does wear a splint on her left wrist.   IMPRESSION:  1. Chronic  left upper extremity pain with probable brachial plexopathy      resulting in weakness and atrophy along with pain.  2. Lumbar degenerative disk disease per plain film.  3. Lumbar degenerative disk disease at L4-5 and L5-S1 without      herniation.  4. Cervical degenerative disease C3 through C6 with stable foraminal      stenosis.   In the office today, we did refill the patient's Percocet along with her  Neurontin, Ultram, and Flexeril medications.  We will plan on seeing her  in followup in approximately 3 months' time.  She has refills on all but  the Percocet medication.  She will call in for refills on all meds as  necessary.           ______________________________  Ellwood Dense, M.D.     DC/MedQ  D:  07/05/2007 12:38:24  T:  07/06/2007 12:17:17  Job #:  [id number]

## 2011-01-24 NOTE — Assessment & Plan Note (Signed)
Wendy [name] returns to clinic today for followup evaluation.  She reports  that overall she is doing fairly well.  She has undergone an MRI scan of  her cervical spine last week.  She is still awaiting results and plans  to see her primary care physician this coming Friday.  We did call up  the results and the MRI scan was compared to July 2008.  Impression was  stable findings with moderate degenerative disk disease at multiple  levels without significant change.  Those results were given to the  patient in the office today.   In terms of her pain medicines, she continues to use the Percocet 4  times a day along with the Ultram 3 times a day, Flexeril twice a day,  and Neurontin twice a day.  She has a sufficient supply of Ultram,  Flexeril, and Neurontin at this point.  She does need a refill on her  Percocet over the next several days.  She does report that all the  medications together give her some relief.  She uses a variety of  different splints on her left upper extremity either through the day or  through the night.   MEDICATIONS:  1. Percocet 5/325 one tablet q.i.d. p.r.n. (approximately 4 per day).  2. Ultram 50 mg one tablet t.i.d. p.r.n.  3. Flexeril 10 mg b.i.d. p.r.n.  4. Neurontin 400 mg one tablet q.a.m. and 2 tablets q.p.m.  5. Imitrex p.r.n.  6. Lexapro 10 mg daily.   REVIEW OF SYSTEMS:  Positive for nausea and vomiting along with limb  swelling.   PHYSICAL EXAMINATION:  GENERAL:  Well-appearing slightly overweight  adult female in mild acute discomfort.  VITAL SIGNS:  Blood pressure is 119/58 with a pulse of 75, respiratory  rate 22, and O2 saturation 99% on room air.  She has 4+/5 strength  throughout the bilateral upper extremities and lower extremities.  She  does wear a splint on her left wrist.   IMPRESSION:  1. 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  2. Lumbar degenerative disk disease  at L4-L5 and L5-S1 without      herniation.  3. Cervical degenerative disk disease at multiple levels, unchanged      from August 2008.   In the office today, we did refill the patient's Percocet.  No refill on  the Ultram, Flexeril, and Neurontin is necessary at this time.  We will  plan on seeing the patient in followup in approximately 3 months' time  with refills prior to that appointment if necessary.  She continues to  get good analgesic effect without significant side effects or signs of  diversion.           ______________________________  Ellwood Dense, M.D.     DC/MedQ  D:  04/08/2008 10:50:48  T:  04/09/2008 02:22:56  Job #:  [id number]

## 2011-01-24 NOTE — Assessment & Plan Note (Signed)
Wendy [name] returns to the clinic today for followup evaluation. She  reports that she is getting some relief from the combination of  Percocet, Ultram, Flexeril, and Neurontin. She reports that she still  has a diagnosis of psoriasis along with urticaria. She reports that she  also has headaches which she describes as migraines, and they are worse  with cold weather. She plans to see a headache specialist in the near  future through her primary care physician. The patient does need  a  refill on her pain medicines in the office today. She complains of welts  of her right upper extremity after the blood pressure cuff was applied.   MEDICATIONS:  1. Percocet 5/325 one tablet q.i.d. p.r.n.  2. Ultram 50 mg 1 tablet t.i.d. p.r.n.  3. Flexeril 10 mg b.i.d. p.r.n.  4. Neurontin 400 mg 1 tablet q.a.m. and 2 tablets every night.  5. Imitrex p.r.n.  6. Lexapro 10 mg daily.   REVIEW OF SYSTEMS:  Positive for nausea and vomiting along with  abdominal pain.   PHYSICAL EXAMINATION:  Well-appearing, slightly overweight, middle-aged  female in mild to no acute discomfort. Blood pressure 115/72 with a  pulse of 81, respiratory rate 18, and O2 saturation 100% on room air.  She has 4+/5 strength throughout the bilateral upper and lower  extremities. She does wear a splint on her left wrist.   IMPRESSION:  1. 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  2. Lumbar degenerative disk disease at L4-5 and L5-S1 without      herniation.  3. Cervical degenerative disk disease C3-C6 with stable foraminal      stenosis.   In the office today, we did refill the patient's Neurontin along with  her Flexeril, Ultram, and Percocet. Each of those were written for  today, December 19, 2007. There were refills on the Ultram, Flexeril, and  Neurontin medications. We will plan on seeing the patient in follow up  in this office in approximately 3-4 months'  time with refills prior to  that appointment as necessary. She will be contacting her primary care  physician about following up with headache specialist.           ______________________________  Ellwood Dense, M.D.     DC/MedQ  D:  12/19/2007 09:39:21  T:  12/19/2007 09:59:58  Job #:  [id number]

## 2011-01-24 NOTE — Assessment & Plan Note (Signed)
Ms. Selke returns to clinic today for followup evaluation.  She  reports that she underwent laparoscopic surgery for endometriosis with  Dr. Tamela Oddi on October 01, 2008.  She still has abdominal pain and  is using pain medicines for that pain along with her back pain.  She  does need a refill on several medicines in the office today.  She is  getting reasonable relief from her Percocet used up to 3-4 times per day  along with the Ultram.  She has sufficient supply of Ultram at this  point.   MEDICATIONS:  1. Percocet 5/325 one tablet q.i.d. p.r.n. (approximately 3-4 per      day).  2. Ultram 50 mg t.i.d. p.r.n.  3. Flexeril 10 mg b.i.d. p.r.n.  4. Neurontin 400 mg 1 tablet q.a.m. and 2 tablets q.p.m.  5. Imitrex p.r.n.  6. Lexapro 10 mg daily.   The patient reports her pain is interfering with lifting, personal care,  walking, sitting, homemaking, traveling, social life, sleeping, and  standing.   PHYSICAL EXAMINATION:  GENERAL:  Well-appearing slightly overweight  adult female, in mild acute discomfort mostly involving her abdomen.  VITAL SIGNS:  Blood pressure was 109/70 with pulse of 88, respiratory  rate 18, and O2 saturation 98% on room air.  EXTREMITIES:  She wears a splint on her left wrist and has 4+/5 strength  throughout the bilateral upper and lower extremities.   IMPRESSION:  1. 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  2. Lumbar degenerative disk disease at L4-L5 and L5-S1 without      herniation.  3. Cervical degenerative disk disease at multiple levels, unchanged      from August 2008.   In the office today, we did refill the patient's Percocet along with her  Flexeril and Neurontin.  No refill on the Ultram is necessary at this  point.  We will plan on seeing her in followup in this office in 1  months' time either with myself or with the nursing staff.  She  continues to get good  analgesic effect without signs of diversion or  significant side effects.           ______________________________  Ellwood Dense, M.D.     DC/MedQ  D:  10/05/2008 09:59:19  T:  10/05/2008 23:24:40  Job #:  [id number]

## 2011-01-24 NOTE — Assessment & Plan Note (Signed)
FOLLOW UP OFFICE NOTE   The patient returns to the clinic today for followup evaluation.  She  has been using a combination of her medicine including them Percocet,  Neurontin, Ultram and Flexeril.  She still reports significant arm and  neck pain even on the right side.  She still has the ongoing  left arm  pain but now reports that she has had right arm pain to such a degree  that she had to be taken by ambulance to the hospital for evaluation.  The apparently gave her morphine and she was ruled out for any cardiac  source of her pain.  She reports that she has had diffuse swelling of  her feet and was started on several day's course of Lasix at 40 mg.  She  reports that she has had a MRI scan of her neck, although I do not have  the report of that.  She did have plain films of her bilateral knees,  lumbar region and thoracic region while at [facility].  I have plain  film results but I do not have any reports of any MRI scans having been  done.  She feels that she has had a MRI scan of her cervical region in  the past and she feels she needs another one to evaluate the increased  pain that she has had even on the right side.   MEDICATIONS:  Include: Percocet 5/325, 1 tablet q.i.d. p.r.n.(2 to 4 per  day); Ultram 50 mg, 1 tablet t.i.d. p.r.n.; Flexeril 10 mg, 1 tablet  b.i.d. p.r.n.; Neurontin 400 mg q. a.m. and 800 mg q. p.m.; Imitrex  p.r.n.; Cymbalta 2 tablets q. at bedtime.   REVIEW OF SYSTEMS:  Positive for limb swelling along with nausea.   PHYSICAL EXAMINATION:  Reasonably well appearing slightly overweight  adult female in mild acute discomfort.  Vitals were no obtained in the  office today.  She has 4+/5 strength throughout the bilateral upper and  lower extremities.  She wears a splint on her left wrist.  She ambulates  without any assistive device.  Upper extremity range of motion was full  and pain free.  Lumbar range of motion was good in the seating position  with  slightly decreased extension and bending laterally.   IMPRESSION:  1. 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  2. Lumbar degenerative disc disease per plain films March 2007.  3. Probable cervical degenerative disc disease.   In the office today we did set the patient up for a cervical MRI scan to  evaluate her increased pain, especially that she is having on the right  side.  She feels that is new for her although she has had the left arm  pain in the past.  We have refilled her Neurontin along with her  Flexeril, Ultram and Percocet in the office today.  She was issued  prescriptions except the Percocet has refills.  Will plan on seeing her  in followup in approximately 2 months time after the MRI scan of her  cervical spine is completed without contrast.           ______________________________  Ellwood Dense, M.D.     DC/MedQ  D:  05/10/2007 12:23:20  T:  05/11/2007 12:30:17  Job #:  [id number]

## 2011-01-24 NOTE — Discharge Summary (Signed)
Wendy [name], Wendy [name] NO.:  [id number]   MEDICAL RECORD NO.:  [id number]          PATIENT TYPE:  IPS   LOCATION:  0300                          FACILITY:  [facility]   PHYSICIAN:  Jasmine Pang, M.D. DATE OF BIRTH:  24-Jul-1966   DATE OF ADMISSION:  11/26/2008  DATE OF DISCHARGE:  11/30/2008                               DISCHARGE SUMMARY   IDENTIFICATION:  This is a 45 year old single African American female  from [place].   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  She had allowed her boyfriend to  live with them for a while.  After he got his unemployment check, he  left without helping with the bills as he had promised.  The patient is  now having homicidal ideation towards the boyfriend and suicidal  ideation to shoot self or drive car off a bridge.   PAST PSYCHIATRIC HISTORY:  This is the first [facility] admission for the  patient.  She is seen at the [facility] for  therapy.  She also has a Technical sales engineer that sees her 3 to 4  times a week.   FAMILY HISTORY:  None.   ALCOHOL AND DRUG HISTORY:  Denies.   MEDICAL PROBLEMS:  Brachial plexus injury with resultant pain in right  hand.   MEDICATIONS:  1. Zyrtec.  2. Claritin.  3. Tramadol 50 mg t.i.d. p.r.n.  4. Lexapro 10 mg p.o. daily.  5. Seroquel 100 mg q.h.s.  6. Flexeril 10 mg b.i.d.  7. Vistaril 50 mg t.i.d.  8. Lasix 20 mg daily.   DRUG ALLERGIES:  ECZEMA CREAM is indicated.   PHYSICAL FINDINGS:  There were no acute physical or medical problems  noted.   ADMISSION LABORATORY:  Sodium was 132.  UDS was negative.  TSH was  1.339.  Urinalysis was negative.  Hemoglobin was 11.5, hematocrit was  34.8, RDW was 16.7.   HOSPITAL COURSE:  On admission, the patient was started on her home  medications of Zyrtec 1 pill at bedtime, Percocet 5/325 mg 1 pill p.o.  q.i.d. p.r.n., Lexapro 10 mg at bedtime,  Seroquel 50 mg at bedtime,  Claritin 10 mg every a.m., Flexeril 10 mg t.i.d., Vistaril 25 mg p.o.  t.i.d., and Ambien 10 mg p.o. q.h.s. p.r.n. insomnia.  She also was  started on Neurontin 400 mg in the morning and at bedtime and Lexapro 20  mg p.o. q.h.s. plus vitamin D 1 pill daily, which is a home medication.  In individual sessions with me, the patient discussed her anger about  her boyfriend leaving her.  She also had a pain management doctor who  recently left the practice and the new pain management doctor discharged  her because she did not have any opiates in her system.  She has severe  pain in her back and neck, and brachial plexus.  She also has  endometriosis.  She states she used to go to [facility], but does  not go anymore.  She had fair eye contact with psychomotor retardation.  Speech was somewhat pressured.  Mood was depressed and anxious.  Affect  depressed.  There is no evidence of psychosis or thought disorder.  On  November 28, 2008, the patient stated she was feeling slightly better  though she admitted to still feeling overwhelmed.  She slept well with  the Ambien.  Her appetite was still decreased.  On November 29, 2008, the  patient was still having passive suicidal ideation with no plan.  Her  Neurontin was increased to 800 mg p.o. q.h.s.  On November 30, 2008, mental  status had improved markedly from admission status.  Sleep was good  appetite was fair to good.  Mood was less depressed, less anxious.  Affect was consistent with mood.  There was no suicidal or homicidal  ideation.  No thoughts of self-injurious behavior.  No auditory or  visual hallucinations.  No paranoia or delusions.  Thoughts were logical  and goal-directed, thought content.  No predominant theme.  Cognitive  grossly intact.  Insight good, judgment good, impulse control good.  The  patient wanted to go home today and was felt to be safe for discharge.   DISCHARGE DIAGNOSES:  Axis I:  Mood  disorder, not otherwise specified.  Anxiety disorder NOS.  Axis II:  None.  Axis III:  Brachial plexus syndrome.  Axis IV:  Severe (problems with primary support group, problems related  to social environment, housing problem, economic problem, burden of  psychiatric illness, burden of medical problems).  Axis V:  Global assessment of functioning was 50 upon discharge.  GAF  was 35-40 upon admission.  GAF highest past year was 60-65.   DISCHARGE PLANS:  There were no specific activity level or dietary  restrictions.   POSTHOSPITAL CARE PLANS:  The patient will be seen at the [facility] on December 08, 2008, at 1:45 p.m.  She will also be seen at  [facility] on January 05, 2009, at 12 noon Dr. Audria Nine.   DISCHARGE MEDICATIONS:  1. Seroquel 50 mg at bedtime.  2. Lexapro 20 mg daily.  3. Neurontin 400 mg in the morning and 800 mg in the evening.  4. Flexeril 10 mg 1 twice a day as needed.  5. Vitamin D daily.  6. Zyrtec for allergy.  7. Claritin as indicated.  8. Tramadol as directed per family doctor.  9. Percocet as directed by her doctor.  10.Lasix as directed by her doctor.      Jasmine Pang, M.D.  Electronically Signed     [facility]/MEDQ  D:  11/30/2008  T:  12/01/2008  Job:  [id number]

## 2011-01-24 NOTE — Assessment & Plan Note (Signed)
Wendy [name] returns to the clinic today for followup evaluation.  She  reports that she did have a lumbar injection with Dr. [name] several days  ago.  She reports that gave her good relief for only approximately 2  days' duration.  During these 2 days, she was able to use slightly less  pain medicines, but now has returned to prior levels, which include  Percocet approximately 3-4 per day and Ultram approximately 3 per day.  She does have a sufficient supply of Flexeril and Neurontin, but does  need refills on her Percocet and Ultram at this time.   MEDICATIONS:  1. Percocet 5/325 one tablet q.i.d. p.r.n. (approximately 3-4 per      day).  2. Ultram 50 mg t.i.d. p.r.n.  3. Flexeril 10 mg b.i.d. p.r.n.  4. Neurontin 400 mg 1 tablet q.a.m. and 2 tablets q.p.m.  5. Imitrex p.r.n.  6. Lexapro 10 mg daily.   REVIEW OF SYSTEMS:  Positive for nausea and vomiting and abdominal pain  with limb swelling.   PHYSICAL EXAMINATION:  GENERAL:  Well-appearing, slightly overweight,  adult female in mild acute discomfort.  VITAL SIGNS:  Blood pressure 111/57 with pulse 70, respiratory rate 18,  and O2 saturation 100% on room air.  She has 4+/5 strength throughout  the bilateral upper extremities.  She wears a splint on her left wrist.   IMPRESSION:  1. 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  2. Lumbar degenerative disease at L4-L5 and L5-S1 without herniation.  3. Cervical degenerative disk disease at multiple levels, unchanged      from August 2008.   In the office today, we did refill the patient's Percocet and her  Ultram, each as of July 17, 2008.  She has refills in the Pharmacy  awaiting her for the Flexeril and the Neurontin.  We will plan on seeing  her in followup in approximately 3 months' time with refills prior to  that appointment as necessary.           ______________________________  Ellwood Dense,  M.D.     DC/MedQ  D:  07/10/2008 10:54:54  T:  07/11/2008 00:35:58  Job #:  [id number]

## 2011-01-27 NOTE — Assessment & Plan Note (Signed)
[name] returns to the clinic today for followup evaluation. She  reports that she gets reasonable relief from her arm and neck pain with  the Flexeril, Percocet, tramadol, and Neurontin medications as noted  below. She also uses almost daily Imitrex nasal spray for migraine  headaches per Dr. Audria Nine. The patient does need a refill on the  tramadol and Neurontin in the office today.   MEDICATIONS:  1. Percocet 5/325 one tablet q.i.d. p.r.n.  2. Ultram 50 mg 1 tablet t.i.d. p.r.n.  3. Flexeril 10 mg b.i.d.  4. Neurontin 400 mg q. a.m. and 800 mg q.h.s.   REVIEW OF SYSTEMS:  Noncontributory.   PHYSICAL EXAMINATION:  Well-appearing, slightly overweight adult female  in mild acute discomfort complaining of migraine headache in the office  today. Blood pressure is 103/64, with pulse of 88, respiratory rate 18,  and O2 saturation 98% on room air. Patient has 4-/5 strength in the left  upper extremity and 5-/5 strength in the right upper extremity. She  wears a wrist splint on her left wrist.   IMPRESSION:  1. 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  2. Lumbar degenerative disc disease per plain films March 2007.  3. Probable cervical degenerative disc disease.   In the office today no refill on the Flexeril or Percocet is necessary.  We did refill the tramadol and Neurontin medications each with 3  refills. We will plan on seeing the patient in follow up in  approximately 3 months time with refills prior to that appointment as  necessary.           ______________________________  Ellwood Dense, M.D.     DC/MedQ  D:  11/14/2006 09:31:09  T:  11/14/2006 10:20:40  Job #:  [id number]

## 2011-01-27 NOTE — Group Therapy Note (Signed)
NAME:  Wendy [name], Wendy [name] NO.:  [id number]   MEDICAL RECORD NO.:  [id number]          PATIENT TYPE:  WOC   LOCATION:  [facility]                   FACILITY:  [facility]   PHYSICIAN:  Argentina Donovan, MD        DATE OF BIRTH:  12/26/1965   DATE OF SERVICE:  06/21/2004                                    CLINIC NOTE   The patient is a 45 year old nulligravida black female who comes in with  irritation in the groin area.  On examination she has a typical intertrigo  that extends back towards the perineum.  We have ordered Aristocort cream  with Nystatin mixture to treat this and suggested that the patient use an  aluminum based antiperspirant to reduce that.      PR/MEDQ  D:  06/21/2004  T:  06/22/2004  Job:  [id number]

## 2011-01-27 NOTE — Assessment & Plan Note (Signed)
OBJECTIVE:  Ms. Yeager returns to clinic today for followup evaluation. I  first and last saw her in this office June 28, 2006, for evaluation and  treatment of chronic left arm and neck pain. The patient had a prior history  of neuropathy of the left upper extremity previously treated by Dr. [name].  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 She  was seen June 28, 2006, and was taking Neurontin along with Flexeril,  sulindac, Percocet and Ultram on a daily basis. This was giving her  reasonable relief, and we continued all those medicines at that time. No  refill on any of the medicines was necessary as she had a sufficient supply  from prior physicians.   The patient reports that the medicines gave her better relief, sometimes  more than other times. She reports that she does need a refill on her  Percocet in the office today. She apparently has a sufficient supply of  Sulindac, Ultram, Flexeril, and Neurontin. She tries to do a little work  around the home in terms of chores, and tries to get out walking on a  regular basis.   MEDICATIONS:  Percocet 5/325 mg 0-2 tablets q. day and Sulindac 200 mg  b.i.d., Ultram 50 mg 1-2 tablets q. day, Flexeril 20 mg b.i.d., Neurontin  400 mg 1 tablet q. a.m. and 2 tablets q. p.m.   REVIEW OF SYSTEMS:  Noncontributory.   PHYSICAL EXAMINATION:  Well-appearing slightly overweight adult female in  mild acute discomfort. Blood pressure is 133/64 with a pulse of 108,  respirations 16, and O2 saturation of 98% on room air. She is 4+ to 5-/5  strength throughout the bilateral upper and lower extremities. Appearance:  Bulk and tone were normal. She has a well-healed scar on her left neck  region. Cervical range of motion was decreased in all planes.   IMPRESSION:  1. 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  2. Lumbar degenerative disk  disease.  Plain films March 2007.  3. Probable cervical degenerative disk disease.   In the office today, we did refill the patient's Percocet at 5/325 mg 1-2  tablets p.o. q. day p.r.n., a total of 60. The other medicines have a  sufficient supply at the present time. We will plan on seeing the patient in  followup in this office in approximately 2-3 month's time with refills prior  to that appointment if necessary.           ______________________________  Ellwood Dense, M.D.     DC/MedQ  D:  07/25/2006 15:05:01  T:  07/25/2006 16:00:08  Job #:  [id number]

## 2011-01-27 NOTE — Assessment & Plan Note (Signed)
FOLLOW UP VISIT   SUBJECTIVE:  Wendy [name] returns to clinic today for follow up  evaluation.  I last saw her in this office July 25, 2006.  She has a  history of chronic left upper extremity pain along with neck pain.  When  we last saw her we had prescribed Percocet 5/325 1-2 tablets p.o. daily.  She reports that she has been using that medication but still has a fair  amount of pain.  She tries to use the tramadol approximately twice a day  as needed.  She also uses the Flexeril and seems to feel that is causing  her some excessive sleepiness.  She would like to have an adjustment in  her medicines.  She also continues to take the Neurontin.  Overall she  gets some relief from the medication but not complete.  She still  complaints of a fair amount of pain in that left upper extremity and  left cervical region.  She did have a cortisone injection recently for a  rash and that seemed to help the rash but did not help her pain.   MEDICATIONS:  1. Percocet 5/325 2 tablets daily p.r.n.  2. Ultram 50 mg 1-2 tablets p.o. daily.  3. Flexeril 20 mg b.i.d.  4. Neurontin 400 mg q. am. and 800 mg q.h.s.   REVIEW OF SYSTEMS:  Positive for limb swelling and skin rash and  breakdown.   PHYSICAL EXAMINATION:  GENERAL:  Well appearing, slightly overweight  adult female in mild-to-moderate acute discomfort involving her left  upper extremity.  VITAL SIGNS:  Blood pressure is 122/60, pulse 85, respiratory rate 16,  O2 saturation 99% on room air.  EXTREMITIES:  Left upper extremity strength was 4-/5.  Right upper  extremity strength was 5-/5.   IMPRESSION:  1. 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  2. Lumbar degenerative disc disease per plain films March 2007.  3. Probable cervical degenerative disc disease.   PLAN:  In the office today we did ask the patient to decrease her  Flexeril down to 10 mg  twice a day.  We increased her Percocet up to 4  tablets per day to see how that does in terms of pain relief.  We will  plan on seeing the patient in follow up in approximately 2 months' time  with refills prior to that appointment as necessary.           ______________________________  Ellwood Dense, M.D.     DC/MedQ  D:  09/19/2006 11:07:25  T:  09/19/2006 11:36:24  Job #:  [id number]

## 2011-01-27 NOTE — Group Therapy Note (Signed)
PURPOSE AND EVALUATION:  Evaluate and treat chronic left arm/neck pain.   REFERRAL:  Dr. Dow Ad[facility]).   HISTORY OF PRESENT ILLNESS:  Wendy [name] is a 45 year old adult female  referred to this office by Dr. Audria Nine at [facility] for evaluation of  chronic left upper extremity pain with other diffuse pain sporadically to  her body.  Medical records are minimal and accompany the patient prior to  this office visit.  These were reviewed prior to the office visit and then  again with the patient in the office today.   On November 14, 2005 the patient underwent lumbar spine films that showed mild  degenerative disc disease at L5-5 with progression since May 2006.  There  was also mild facet degenerative changes at L5-S1.  Thoracic spine films  done on the same day were negative.  Left and right bilateral knee films  also showed no significant abnormality done on November 14, 2005.   On March 02, 2006 the patient followed up with [facility].  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 [name].  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  She was prescribed Neurontin along  with Flexeril, Sulindac and Percocet on an as needed basis.   The patient had an evaluation at [facility] and was referred to physical  therapy with traction with minimal improvement noted.   Presently, the patient reports that she has pain mostly focused in her left  upper extremity involving her left shoulder, left neck and entire left upper  extremity.  She reports occasional tingling and shock-type pain especially  in her left hand.  She does wear wrist splint on her left hand.  She has  some atrophy that is evident in her left upper extremity especially  distally.   The patient reports that she also has some  occasional pain in her right  forearm and wrist and also pain occasionally in her left knee.  She also  complains of some occasional drawing up of her left foot.   The patient denies any bowel or bladder dysfunction.   In terms of medication, she reports that she gets reasonable relief from her  pain medicines noted below.  She tends to use Ultram as her first choice  pain medicine and generally uses 2-4 tablets a day.  She uses Flexeril  usually at night and Neurontin usually is scheduled once in the morning and  2 tablets in the evening.  She uses Percocet anywhere from 0-2 tablets per  day and seems to have reasonable control on use of narcotic medication.   PAST MEDICAL HISTORY:  1. History of depression.  2. History of hives and rashes treated with prednisone.  3. Seasonal allergies.  4. Prior cervical rib removal in 1999 by Dr. [name] with subsequent      neuropathy of the left upper extremity.  5. Ovarian cyst removed in 2002.   ALLERGIES:  NO KNOWN DRUG ALLERGIES.   FAMILY HISTORY:  The patient's family history is positive for hypertension,  diabetes mellitus, heart disease and there is a brother, who reportedly has  cirrhosis.   SOCIAL HISTORY:  The patient is single and lives with her mother and father.  She denies any street drugs.  She smokes 1 pack of cigarettes per day.  She  reports rare/occasional alcohol usage.  She has no children.  She has  applied for disability and was turned down and is waiting to go before a  judge.  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   MEDICATIONS:  1. Neurontin 400 mg 1 tablet in the morning and 2 tablets in the evening.  2. Hydroxyzine 25 mg t.i.d. p.r.n.  3. Tramadol 50 mg 1-2 tablets p.o. t.i.d. p.r.n.  4. Sulindac 200 mg b.i.d.  5. Percocet 5/325, 1 tablet q.4-6h, p.r.n. (0-2 per day).  6. Flexeril 10 mg, 2 tablets q.h.s.  7. Cymbalta 60 mg daily.  8. Allegra 180 mg daily.  9. Zyrtec 10 mg daily.   10.Prednisone 20 mg daily.   REVIEW OF SYSTEMS:  Noncontributory.   PHYSICAL EXAMINATION:  GENERAL:  Reveals a well-appearing slightly  overweight small adult middle-age female in mild acute discomfort.  VITAL SIGNS:  Blood pressure 118/62.  Pulse 98.  Respiratory rate 16.  O2  saturation 99% on room air.  Height was 4 feet 11 inches.  Weight 182  pounds.  EXTREMITIES:  The patient ambulates without any assistive device.  She can  toe walk and heel walk with minimal difficulty.  Upper extremity range of  motion was full with some complaints of pain especially with shoulder  abduction and flexion.  The patient wears a splint on her distal left upper  extremity.  Range of motion was otherwise normal throughout the bilateral  upper extremities.  Strength was 5/5 in the right upper extremity and 4-/5  in the left upper extremity.  There was definite atrophy noted in the left  thenar and hypothenar eminences of the left hand.  The fingers were also  small compared to the right side.  Sensation was intact to light touch  throughout the bilateral upper extremities but then she reported that she  had some occasional numbness in the left hand.  That does not appear to be  present today.  Lower extremity exam showed 5-/5 strength throughout the  bilateral lower extremities and hip flexion, knee extension and ankle  dorsiflexion.   In the supine position, hip range of motion was normal and bilateral  straight leg raise was negative to 30 degrees.   IMPRESSION:  1. 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  2. Lumbar degenerative disc disease per plain films March 2007.  3. Probable cervical degenerative disc disease although no films present.   At the present time, the patient is getting reasonable relief from the combination of her Percocet, Sulindac,Ultram, Flexeril and Neurontin.  She  does not need refills  on any of those in the office today.  We have asked  her to continue each of those as she is taking them at present.  She  understands that all pain medicines need to come through this office but her  other medicines will be prescribed through [facility].  Will plan on  seeing the patient in followup in this office in approximately 1 month's  time.  Unfortunately, she has had atrophy develop after her prior cervical  rib was removed in 1999 and the likelihood of having functional normal  return in the left upper extremity is poor 7 years later.   Will plan to see her for follow up as noted above with adjustment in her  pain medicines as necessary at that  time.           ______________________________  Ellwood Dense, M.D.     DC/MedQ  D:  06/28/2006 09:52:17  T:  06/29/2006 11:42:20  Job #:  [id number]   cc:   Maurice March, M.D.  Fax: [phone]

## 2011-05-05 ENCOUNTER — Other Ambulatory Visit: Payer: Self-pay | Admitting: Obstetrics & Gynecology

## 2011-05-05 DIAGNOSIS — Z79818 Long term (current) use of other agents affecting estrogen receptors and estrogen levels: Secondary | ICD-10-CM

## 2011-05-11 ENCOUNTER — Ambulatory Visit (HOSPITAL_COMMUNITY)
Admission: RE | Admit: 2011-05-11 | Discharge: 2011-05-11 | Disposition: A | Discharge status: Home / Self Care | Payer: Medicaid Other | Source: Ambulatory Visit | Attending: Obstetrics & Gynecology | Admitting: Obstetrics & Gynecology

## 2011-05-11 DIAGNOSIS — Z79818 Long term (current) use of other agents affecting estrogen receptors and estrogen levels: Secondary | ICD-10-CM

## 2011-05-11 DIAGNOSIS — Z13820 Encounter for screening for osteoporosis: Secondary | ICD-10-CM | POA: Insufficient documentation

## 2011-05-16 ENCOUNTER — Other Ambulatory Visit (HOSPITAL_COMMUNITY): Payer: Self-pay | Admitting: Family Medicine

## 2011-05-16 DIAGNOSIS — Z1231 Encounter for screening mammogram for malignant neoplasm of breast: Secondary | ICD-10-CM

## 2011-05-18 ENCOUNTER — Ambulatory Visit: Payer: Medicaid Other | Admitting: Physical Therapy

## 2011-05-31 ENCOUNTER — Ambulatory Visit: Payer: Medicaid Other | Admitting: Physical Therapy

## 2011-06-02 LAB — POCT URINALYSIS DIP (DEVICE)
Bilirubin Urine: NEGATIVE
Glucose, UA: NEGATIVE
Ketones, ur: NEGATIVE
Nitrite: NEGATIVE
pH: 7

## 2011-06-02 LAB — POCT PREGNANCY, URINE: Preg Test, Ur: NEGATIVE

## 2011-06-06 ENCOUNTER — Ambulatory Visit: Payer: Medicaid Other | Attending: Family Medicine | Admitting: Rehabilitative and Restorative Service Providers"

## 2011-06-08 ENCOUNTER — Ambulatory Visit (HOSPITAL_COMMUNITY)
Admission: RE | Admit: 2011-06-08 | Discharge: 2011-06-08 | Disposition: A | Discharge status: Home / Self Care | Payer: Medicaid Other | Source: Ambulatory Visit | Attending: Family Medicine | Admitting: Family Medicine

## 2011-06-08 DIAGNOSIS — Z1231 Encounter for screening mammogram for malignant neoplasm of breast: Secondary | ICD-10-CM | POA: Insufficient documentation

## 2011-06-09 LAB — DIFFERENTIAL
Basophils Relative: 0
Eosinophils Relative: 2
Lymphocytes Relative: 41
Monocytes Absolute: 0.5
Monocytes Relative: 9
Neutro Abs: 3

## 2011-06-09 LAB — CBC
HCT: 39.1
Hemoglobin: 13
MCHC: 33.2
RBC: 4.4
RDW: 12.7

## 2011-06-09 LAB — URINALYSIS, ROUTINE W REFLEX MICROSCOPIC
Bilirubin Urine: NEGATIVE
Hgb urine dipstick: NEGATIVE
Ketones, ur: NEGATIVE
Specific Gravity, Urine: 1.037 — ABNORMAL HIGH
pH: 5.5

## 2011-06-09 LAB — POCT I-STAT, CHEM 8
Calcium, Ion: 0.92 — ABNORMAL LOW
Chloride: 111
Creatinine, Ser: 1.1
Glucose, Bld: 103 — ABNORMAL HIGH
Potassium: 3.5

## 2011-06-23 LAB — DIFFERENTIAL
Basophils Relative: 1
Monocytes Absolute: 0.4
Monocytes Relative: 6
Neutro Abs: 4.6

## 2011-06-23 LAB — BASIC METABOLIC PANEL
CO2: 25
Calcium: 9.1
Chloride: 108
GFR calc Af Amer: >60
Potassium: 3.2 — ABNORMAL LOW
Sodium: 141

## 2011-06-23 LAB — URINALYSIS, ROUTINE W REFLEX MICROSCOPIC
Glucose, UA: NEGATIVE
Hgb urine dipstick: NEGATIVE
Specific Gravity, Urine: 1.01
pH: 7.5

## 2011-06-23 LAB — CBC
HCT: 35 — ABNORMAL LOW
Hemoglobin: 11.8 — ABNORMAL LOW
MCHC: 33.7
RBC: 4

## 2011-06-26 LAB — BASIC METABOLIC PANEL
BUN: 4 — ABNORMAL LOW
Chloride: 109
GFR calc non Af Amer: >60
Glucose, Bld: 98
Potassium: 3.7

## 2011-06-26 LAB — POCT CARDIAC MARKERS: Troponin i, poc: <0.05

## 2011-07-13 HISTORY — PX: SHOULDER ARTHROSCOPY W/ ROTATOR CUFF REPAIR: SHX2400

## 2011-08-24 ENCOUNTER — Encounter: Payer: Self-pay | Admitting: *Deleted

## 2011-08-24 ENCOUNTER — Emergency Department (INDEPENDENT_AMBULATORY_CARE_PROVIDER_SITE_OTHER)
Admission: EM | Admit: 2011-08-24 | Discharge: 2011-08-24 | Disposition: A | Discharge status: Home / Self Care | Payer: Self-pay | Source: Home / Self Care | Attending: Family Medicine | Admitting: Family Medicine

## 2011-08-24 DIAGNOSIS — M25519 Pain in unspecified shoulder: Secondary | ICD-10-CM

## 2011-08-24 HISTORY — DX: Injury of brachial plexus, initial encounter: S14.3XXA

## 2011-08-24 HISTORY — DX: Cervical disc disorder, unspecified, unspecified cervical region: M50.90
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
## 2011-12-20 ENCOUNTER — Ambulatory Visit: Payer: Medicaid Other | Attending: Orthopedic Surgery | Admitting: Physical Therapy

## 2011-12-20 DIAGNOSIS — M25519 Pain in unspecified shoulder: Secondary | ICD-10-CM | POA: Insufficient documentation

## 2011-12-20 DIAGNOSIS — M25619 Stiffness of unspecified shoulder, not elsewhere classified: Secondary | ICD-10-CM | POA: Insufficient documentation

## 2011-12-20 DIAGNOSIS — M6281 Muscle weakness (generalized): Secondary | ICD-10-CM | POA: Insufficient documentation

## 2011-12-20 DIAGNOSIS — R293 Abnormal posture: Secondary | ICD-10-CM | POA: Insufficient documentation

## 2011-12-20 DIAGNOSIS — IMO0001 Reserved for inherently not codable concepts without codable children: Secondary | ICD-10-CM | POA: Insufficient documentation

## 2011-12-27 ENCOUNTER — Encounter: Payer: Medicaid Other | Admitting: Physical Therapy

## 2012-01-03 ENCOUNTER — Ambulatory Visit: Payer: Medicaid Other | Admitting: Physical Therapy

## 2012-01-10 DIAGNOSIS — Q6689 Other  specified congenital deformities of feet: Secondary | ICD-10-CM

## 2012-01-10 HISTORY — DX: Other specified congenital deformities of feet: Q66.89

## 2012-01-29 ENCOUNTER — Encounter (HOSPITAL_BASED_OUTPATIENT_CLINIC_OR_DEPARTMENT_OTHER): Payer: Self-pay | Admitting: *Deleted

## 2012-02-01 ENCOUNTER — Encounter (HOSPITAL_BASED_OUTPATIENT_CLINIC_OR_DEPARTMENT_OTHER): Payer: Self-pay | Admitting: Anesthesiology

## 2012-02-01 ENCOUNTER — Encounter (HOSPITAL_BASED_OUTPATIENT_CLINIC_OR_DEPARTMENT_OTHER): Payer: Self-pay | Admitting: Certified Registered Nurse Anesthetist

## 2012-02-01 ENCOUNTER — Encounter (HOSPITAL_BASED_OUTPATIENT_CLINIC_OR_DEPARTMENT_OTHER): Payer: Self-pay

## 2012-02-01 ENCOUNTER — Encounter (HOSPITAL_BASED_OUTPATIENT_CLINIC_OR_DEPARTMENT_OTHER)
Admission: RE | Disposition: A | Discharge status: Home / Self Care | Payer: Self-pay | Source: Ambulatory Visit | Attending: Orthopedic Surgery

## 2012-02-01 ENCOUNTER — Ambulatory Visit (HOSPITAL_BASED_OUTPATIENT_CLINIC_OR_DEPARTMENT_OTHER)
Admission: RE | Admit: 2012-02-01 | Discharge: 2012-02-01 | Disposition: A | Discharge status: Home / Self Care | Payer: Medicaid Other | Source: Ambulatory Visit | Attending: Orthopedic Surgery | Admitting: Orthopedic Surgery

## 2012-02-01 ENCOUNTER — Ambulatory Visit (HOSPITAL_BASED_OUTPATIENT_CLINIC_OR_DEPARTMENT_OTHER): Payer: Medicaid Other | Admitting: Anesthesiology

## 2012-02-01 DIAGNOSIS — F3289 Other specified depressive episodes: Secondary | ICD-10-CM | POA: Insufficient documentation

## 2012-02-01 DIAGNOSIS — G8929 Other chronic pain: Secondary | ICD-10-CM | POA: Insufficient documentation

## 2012-02-01 DIAGNOSIS — F411 Generalized anxiety disorder: Secondary | ICD-10-CM | POA: Insufficient documentation

## 2012-02-01 DIAGNOSIS — F329 Major depressive disorder, single episode, unspecified: Secondary | ICD-10-CM | POA: Insufficient documentation

## 2012-02-01 DIAGNOSIS — IMO0002 Reserved for concepts with insufficient information to code with codable children: Secondary | ICD-10-CM | POA: Insufficient documentation

## 2012-02-01 DIAGNOSIS — M24676 Ankylosis, unspecified foot: Secondary | ICD-10-CM | POA: Insufficient documentation

## 2012-02-01 DIAGNOSIS — J45909 Unspecified asthma, uncomplicated: Secondary | ICD-10-CM | POA: Insufficient documentation

## 2012-02-01 DIAGNOSIS — Q6689 Other  specified congenital deformities of feet: Secondary | ICD-10-CM

## 2012-02-01 HISTORY — DX: Localized edema: R60.0

## 2012-02-01 HISTORY — DX: Presence of dental prosthetic device (complete) (partial): Z97.2

## 2012-02-01 HISTORY — DX: Anxiety disorder, unspecified: F41.9

## 2012-02-01 HISTORY — DX: Allergy status to unspecified drugs, medicaments and biological substances: Z88.9

## 2012-02-01 HISTORY — PX: ANKLE RECONSTRUCTION: SHX1151

## 2012-02-01 HISTORY — DX: Other chronic pain: G89.29

## 2012-02-01 HISTORY — DX: Other specified postprocedural states: R11.2

## 2012-02-01 HISTORY — DX: Other specified postprocedural states: Z98.890

## 2012-02-01 HISTORY — DX: Other specified congenital deformities of feet: Q66.89

## 2012-02-01 HISTORY — DX: Unspecified acquired deformity of hand, unspecified hand: M21.949

## 2012-02-01 HISTORY — DX: Depression, unspecified: F32.A

## 2012-02-01 HISTORY — DX: Major depressive disorder, single episode, unspecified: F32.9

## 2012-02-01 SURGERY — RECONSTRUCTION, ANKLE
Anesthesia: General | Site: Ankle | Laterality: Right | Wound class: Clean

## 2012-02-01 MED ORDER — FENTANYL CITRATE 0.05 MG/ML IJ SOLN
INTRAMUSCULAR | Status: DC | PRN
  Administered 2012-02-01: 25 ug via INTRAVENOUS

## 2012-02-01 MED ORDER — LIDOCAINE HCL 1 % IJ SOLN
INTRAMUSCULAR | Status: DC | PRN
  Administered 2012-02-01: 2 mL via INTRADERMAL

## 2012-02-01 MED ORDER — ONDANSETRON HCL 4 MG/2ML IJ SOLN
INTRAMUSCULAR | Status: DC | PRN
  Administered 2012-02-01: 4 mg via INTRAVENOUS

## 2012-02-01 MED ORDER — ROPIVACAINE HCL 5 MG/ML IJ SOLN
INTRAMUSCULAR | Status: DC | PRN
  Administered 2012-02-01: 25 mL via EPIDURAL

## 2012-02-01 MED ORDER — CHLORHEXIDINE GLUCONATE 4 % EX LIQD: 60.0000 mL | Freq: Once | CUTANEOUS | Status: DC

## 2012-02-01 MED ORDER — PROPOFOL 10 MG/ML IV EMUL
INTRAVENOUS | Status: DC | PRN
  Administered 2012-02-01: 200 mg via INTRAVENOUS

## 2012-02-01 MED ORDER — METOCLOPRAMIDE HCL 5 MG/ML IJ SOLN
INTRAMUSCULAR | Status: DC | PRN
  Administered 2012-02-01: 10 mg via INTRAVENOUS

## 2012-02-01 MED ORDER — DEXAMETHASONE SODIUM PHOSPHATE 10 MG/ML IJ SOLN
INTRAMUSCULAR | Status: DC | PRN
  Administered 2012-02-01: 4 mg via INTRAVENOUS

## 2012-02-01 MED ORDER — LIDOCAINE-EPINEPHRINE 1.5-1:200000 % IJ SOLN
INTRAMUSCULAR | Status: DC | PRN
  Administered 2012-02-01: 25 mL via INTRADERMAL

## 2012-02-01 MED ORDER — MIDAZOLAM HCL 5 MG/5ML IJ SOLN
INTRAMUSCULAR | Status: DC | PRN
  Administered 2012-02-01: 1 mg via INTRAVENOUS

## 2012-02-01 MED ORDER — BACITRACIN ZINC 500 UNIT/GM EX OINT
TOPICAL_OINTMENT | CUTANEOUS | Status: DC | PRN
  Administered 2012-02-01: 1 via TOPICAL

## 2012-02-01 MED ORDER — OXYCODONE HCL 5 MG PO TABS: 5.0000 mg | ORAL_TABLET | ORAL | Status: AC | PRN

## 2012-02-01 MED ORDER — LACTATED RINGERS IV SOLN
INTRAVENOUS | Status: DC
  Administered 2012-02-01 (×2): via INTRAVENOUS

## 2012-02-01 MED ORDER — DROPERIDOL 2.5 MG/ML IJ SOLN
INTRAMUSCULAR | Status: DC | PRN
  Administered 2012-02-01: 0.625 mg via INTRAVENOUS

## 2012-02-01 MED ORDER — OXYCODONE HCL 5 MG PO TABS
5.0000 mg | ORAL_TABLET | Freq: Once | ORAL | Status: AC
  Administered 2012-02-01: 5 mg via ORAL

## 2012-02-01 MED ORDER — MIDAZOLAM HCL 2 MG/2ML IJ SOLN
0.5000 mg | INTRAMUSCULAR | Status: DC | PRN
  Administered 2012-02-01: 2 mg via INTRAVENOUS

## 2012-02-01 MED ORDER — CEFAZOLIN SODIUM-DEXTROSE 2-3 GM-% IV SOLR
2.0000 g | INTRAVENOUS | Status: AC
  Administered 2012-02-01: 2 g via INTRAVENOUS

## 2012-02-01 MED ORDER — SODIUM CHLORIDE 0.9 % IV SOLN: INTRAVENOUS | Status: DC

## 2012-02-01 MED ORDER — LIDOCAINE HCL (CARDIAC) 20 MG/ML IV SOLN
INTRAVENOUS | Status: DC | PRN
  Administered 2012-02-01: 50 mg via INTRAVENOUS

## 2012-02-01 MED ORDER — METOCLOPRAMIDE HCL 5 MG/ML IJ SOLN: 10.0000 mg | Freq: Once | INTRAMUSCULAR | Status: DC | PRN

## 2012-02-01 MED ORDER — FENTANYL CITRATE 0.05 MG/ML IJ SOLN
50.0000 ug | INTRAMUSCULAR | Status: DC | PRN
  Administered 2012-02-01: 100 ug via INTRAVENOUS

## 2012-02-01 MED ORDER — HYDROMORPHONE HCL PF 1 MG/ML IJ SOLN: 0.2500 mg | INTRAMUSCULAR | Status: DC | PRN

## 2012-02-01 SURGICAL SUPPLY — 67 items
BAG DECANTER FOR FLEXI CONT (MISCELLANEOUS) IMPLANT
BANDAGE ESMARK 6X9 LF (GAUZE/BANDAGES/DRESSINGS) ×1 IMPLANT
BLADE AVERAGE 25X9 (BLADE) IMPLANT
BLADE SURG 15 STRL LF DISP TIS (BLADE) ×2 IMPLANT
BLADE SURG 15 STRL SS (BLADE) ×4
BNDG CMPR 9X4 STRL LF SNTH (GAUZE/BANDAGES/DRESSINGS)
BNDG CMPR 9X6 STRL LF SNTH (GAUZE/BANDAGES/DRESSINGS) ×1
BNDG COHESIVE 4X5 TAN STRL (GAUZE/BANDAGES/DRESSINGS) ×2 IMPLANT
BNDG COHESIVE 6X5 TAN STRL LF (GAUZE/BANDAGES/DRESSINGS) IMPLANT
BNDG ESMARK 4X9 LF (GAUZE/BANDAGES/DRESSINGS) IMPLANT
BNDG ESMARK 6X9 LF (GAUZE/BANDAGES/DRESSINGS) ×2
CHLORAPREP W/TINT 26ML (MISCELLANEOUS) ×2 IMPLANT
COVER TABLE BACK 60X90 (DRAPES) ×2 IMPLANT
CUFF TOURNIQUET SINGLE 18IN (TOURNIQUET CUFF) IMPLANT
CUFF TOURNIQUET SINGLE 34IN LL (TOURNIQUET CUFF) ×1 IMPLANT
DECANTER SPIKE VIAL GLASS SM (MISCELLANEOUS) IMPLANT
DRAPE EXTREMITY T 121X128X90 (DRAPE) ×2 IMPLANT
DRAPE INCISE IOBAN 66X45 STRL (DRAPES) IMPLANT
DRAPE OEC MINIVIEW 54X84 (DRAPES) ×1 IMPLANT
DRAPE U-SHAPE 47X51 STRL (DRAPES) ×1 IMPLANT
DRSG EMULSION OIL 3X3 NADH (GAUZE/BANDAGES/DRESSINGS) ×2 IMPLANT
DRSG PAD ABDOMINAL 8X10 ST (GAUZE/BANDAGES/DRESSINGS) ×4 IMPLANT
DURA STEPPER MED (CAST SUPPLIES) IMPLANT
DURA STEPPER SML (CAST SUPPLIES) ×1 IMPLANT
ELECT REM PT RETURN 9FT ADLT (ELECTROSURGICAL) ×2
ELECTRODE REM PT RTRN 9FT ADLT (ELECTROSURGICAL) ×1 IMPLANT
GLOVE BIO SURGEON STRL SZ8 (GLOVE) ×4 IMPLANT
GLOVE BIOGEL PI IND STRL 8 (GLOVE) ×1 IMPLANT
GLOVE BIOGEL PI INDICATOR 8 (GLOVE) ×1
GLOVE ECLIPSE 6.5 STRL STRAW (GLOVE) ×1 IMPLANT
GOWN PREVENTION PLUS XLARGE (GOWN DISPOSABLE) ×1 IMPLANT
GOWN PREVENTION PLUS XXLARGE (GOWN DISPOSABLE) ×1 IMPLANT
KWIRE 4.0 X .062IN (WIRE) ×1 IMPLANT
NEEDLE HYPO 22GX1.5 SAFETY (NEEDLE) IMPLANT
PACK BASIN DAY SURGERY FS (CUSTOM PROCEDURE TRAY) ×2 IMPLANT
PAD CAST 4YDX4 CTTN HI CHSV (CAST SUPPLIES) ×1 IMPLANT
PADDING CAST ABS 4INX4YD NS (CAST SUPPLIES)
PADDING CAST ABS COTTON 4X4 ST (CAST SUPPLIES) IMPLANT
PADDING CAST COTTON 4X4 STRL (CAST SUPPLIES) ×2
PADDING CAST COTTON 6X4 STRL (CAST SUPPLIES) ×2 IMPLANT
PASSER SUT SWANSON 36MM LOOP (INSTRUMENTS) IMPLANT
PENCIL BUTTON HOLSTER BLD 10FT (ELECTRODE) ×2 IMPLANT
SHEET MEDIUM DRAPE 40X70 STRL (DRAPES) ×2 IMPLANT
SPLINT FAST PLASTER 5X30 (CAST SUPPLIES)
SPLINT PLASTER CAST FAST 5X30 (CAST SUPPLIES) ×20 IMPLANT
SPONGE GAUZE 4X4 12PLY (GAUZE/BANDAGES/DRESSINGS) ×2 IMPLANT
SPONGE LAP 18X18 X RAY DECT (DISPOSABLE) ×2 IMPLANT
STOCKINETTE 6  STRL (DRAPES) ×1
STOCKINETTE 6 STRL (DRAPES) ×1 IMPLANT
SUCTION FRAZIER TIP 10 FR DISP (SUCTIONS) IMPLANT
SUT ETHIBOND 0 MO6 C/R (SUTURE) IMPLANT
SUT ETHIBOND 2 OS 4 DA (SUTURE) IMPLANT
SUT FIBERWIRE 2-0 18 17.9 3/8 (SUTURE)
SUT MERSILENE 2.0 SH NDLE (SUTURE) IMPLANT
SUT MNCRL AB 3-0 PS2 18 (SUTURE) ×2 IMPLANT
SUT MNCRL AB 4-0 PS2 18 (SUTURE) IMPLANT
SUT PROLENE 3 0 PS 2 (SUTURE) ×2 IMPLANT
SUT VIC AB 0 SH 27 (SUTURE) ×2 IMPLANT
SUT VIC AB 2-0 SH 18 (SUTURE) IMPLANT
SUT VIC AB 2-0 SH 27 (SUTURE)
SUT VIC AB 2-0 SH 27XBRD (SUTURE) IMPLANT
SUT VICRYL 4-0 PS2 18IN ABS (SUTURE) IMPLANT
SUTURE FIBERWR 2-0 18 17.9 3/8 (SUTURE) IMPLANT
SYR BULB 3OZ (MISCELLANEOUS) ×2 IMPLANT
TUBE CONNECTING 20X1/4 (TUBING) IMPLANT
UNDERPAD 30X30 INCONTINENT (UNDERPADS AND DIAPERS) ×2 IMPLANT
WATER STERILE IRR 1000ML POUR (IV SOLUTION) ×2 IMPLANT

## 2012-02-01 NOTE — Anesthesia Procedure Notes (Addendum)
Anesthesia Regional Block:  Popliteal block  Pre-Anesthetic Checklist: ,, timeout performed, Correct Patient, Correct Site, Correct Laterality, Correct Procedure, Correct Position, site marked, Risks and benefits discussed,  Surgical consent,  Pre-op evaluation,  At surgeon's request and post-op pain management  Laterality: Right  Prep: chloraprep       Needles:   Needle Type: Other   (Arrow Echogenic)   Needle Length: 9cm  Needle Gauge: 21    Additional Needles:  Procedures: ultrasound guided Popliteal block Narrative:  Start time: 02/01/2012 7:04 AM End time: 02/01/2012 7:12 AM Injection made incrementally with aspirations every 5 mL.  Performed by: Personally  Anesthesiologist: Aldona Lento, MD  Additional Notes: Ultrasound guidance used to: id relevant anatomy, confirm needle position, local anesthetic spread, avoidance of vascular puncture. Picture saved. No complications. Block performed personally by Janetta Hora. Gelene Mink, MD  .    Popliteal block Procedure Name: LMA Insertion Date/Time: 02/01/2012 7:35 AM Performed by: Laityn Bensen D Pre-anesthesia Checklist: Patient identified, Emergency Drugs available, Suction available and Patient being monitored Patient Re-evaluated:Patient Re-evaluated prior to inductionOxygen Delivery Method: Circle System Utilized Preoxygenation: Pre-oxygenation with 100% oxygen Intubation Type: IV induction Ventilation: Mask ventilation without difficulty LMA: LMA inserted LMA Size: 4.0 Number of attempts: 1 Placement Confirmation: positive ETCO2 Tube secured with: Tape Dental Injury: Teeth and Oropharynx as per pre-operative assessment

## 2012-02-01 NOTE — Anesthesia Preprocedure Evaluation (Signed)
Anesthesia Evaluation  Patient identified by MRN, date of birth, ID band Patient awake    Reviewed: Allergy & Precautions, H&P , NPO status , Patient's Chart, lab work & pertinent test results, reviewed documented beta blocker date and time   History of Anesthesia Complications (+) PONV  Airway Mallampati: II TM Distance: >3 FB Neck ROM: full    Dental   Pulmonary asthma ,          Cardiovascular negative cardio ROS      Neuro/Psych  Headaches, PSYCHIATRIC DISORDERS  Neuromuscular disease    GI/Hepatic negative GI ROS, Neg liver ROS,   Endo/Other  negative endocrine ROSMorbid obesity  Renal/GU negative Renal ROS  negative genitourinary   Musculoskeletal   Abdominal   Peds  Hematology negative hematology ROS (+)   Anesthesia Other Findings See surgeon's H&P   Reproductive/Obstetrics negative OB ROS                           Anesthesia Physical Anesthesia Plan  ASA: III  Anesthesia Plan: General   Post-op Pain Management:    Induction: Intravenous  Airway Management Planned: LMA  Additional Equipment:   Intra-op Plan:   Post-operative Plan: Extubation in OR  Informed Consent: I have reviewed the patients History and Physical, chart, labs and discussed the procedure including the risks, benefits and alternatives for the proposed anesthesia with the patient or authorized representative who has indicated his/her understanding and acceptance.   Dental Advisory Given  Plan Discussed with: CRNA and Surgeon  Anesthesia Plan Comments:         Anesthesia Quick Evaluation

## 2012-02-01 NOTE — Op Note (Signed)
NAMEJANAISA, BIRKLAND NO.:  [id number]  MEDICAL RECORD NO.:  [id number]  LOCATION:                                 FACILITY:  PHYSICIAN:  Toni Arthurs, MD        DATE OF BIRTH:  20-Mar-1966  DATE OF PROCEDURE:  02/01/2012 DATE OF DISCHARGE:                              OPERATIVE REPORT   PREOPERATIVE DIAGNOSIS:  Right foot calcaneonavicular coalition.  POSTOPERATIVE DIAGNOSIS:  Right foot calcaneonavicular coalition.  PROCEDURE: 1. Excision of right calcaneonavicular coalition. 2. Autograft fat interposition into coalition site. 3. Intraoperative interpretation of fluoroscopic imaging.  SURGEON:  Toni Arthurs, MD  ANESTHESIA:  General, regional.  ESTIMATED BLOOD LOSS:  Minimal.  TOURNIQUET TIME:  42 minutes at 225 mmHg.  COMPLICATIONS:  None apparent.  DISPOSITION:  Extubated, awake, and stable to recovery.  INDICATIONS FOR PROCEDURE:  The patient is a 46 year old female with a long history of right dorsal lateral foot pain.  She has failed treatment with orthotics, activity modification, and oral pain medicines.  She presents now for excision of this coalition.  She understands the risks and benefits, the alternative treatment options, and elects for surgical treatment.  She specifically understands risks of bleeding, infection, nerve damage, blood clots, need for additional surgery, continued pain, amputation, and death.  PROCEDURE IN DETAIL:  After preoperative consent was obtained and the correct operative site was identified, the patient was brought to the operating room and placed supine on the operating table.  General anesthesia was induced.  Preoperative antibiotics were administered. Surgical time-out was taken.  Right lower extremity was prepped and draped in standard sterile fashion with a tourniquet around the thigh. The extremity was exsanguinated, and the tourniquet was inflated to 225 mmHg.  A longitudinal incision was made from the  base of the fourth metatarsal to the sinus tarsi.  Sharp dissection was carried down through the skin.  The extensor digitorum brevis muscle was then elevated from the lateral aspect of the cuboid and the anterior process of the calcaneus.  The collision site was identified.  Two K-wires were inserted on either side of what appeared to be the coalition site and lateral oblique radiographs were obtained confirming that this was the appropriate location.  An osteotome was then used to resect the area of coalition at the anterior process of the calcaneus.  The lateral aspect of the navicular was also excised creating a generous space between the 2 bones.  A curette and rongeur were then used to remove all the bony fragments and the remaining bone at the plantar aspect of the coalition site.  A #2 curette could easily be passed through this space with no evidence of impingement.  An oblique radiograph was then obtained showing a wide-open space between the calcaneus and navicular in this previous coalition site.  Wound was irrigated copiously.  Autograft fat was harvested from the sinus tarsi.  This was packed into the collision site.  The extensor digitorum brevis was then repaired to the periosteum securing the graft in place.  The superficial fascia was repaired with simple sutures of 0 Vicryl.  The subcutaneous tissue was closed with inverted simple sutures of  3-0 Monocryl, and a running 3-0 Prolene was used to close the skin incision.  Sterile dressings were applied followed by compression wrap.  The tourniquet was released at 42 minutes after application of the dressings.  The patient was awakened from anesthesia and transported to the recovery room in stable condition.  FOLLOWUP PLAN:  The patient will be weightbearing as tolerated on the right lower extremity.  She will follow up with me in 2 weeks for suture removal.     Toni Arthurs, MD     JH/MEDQ  D:  02/01/2012  T:   02/01/2012  Job:  [id number]

## 2012-02-01 NOTE — Discharge Instructions (Addendum)
Toni Arthurs, MD [facility]  Please read the following information regarding your care after surgery.  Medications  You only need a prescription for the narcotic pain medicine (ex. oxycodone, Percocet, Norco).  All of the other medicines listed below are available over the counter. X acetominophen (Tylenol) 650 mg every 4-6 hours as you need for minor pain X oxycodone as prescribed for moderate to severe pain ?   Narcotic pain medicine (ex. oxycodone, Percocet, Vicodin) will cause constipation.  To prevent this problem, take the following medicines while you are taking any pain medicine. X docusate sodium (Colace) 100 mg twice a day X senna (Senokot) 2 tablets twice a day  ? To help prevent blood clots, take an aspirin (325 mg) once a day for a month after surgery.  You should also get up every hour while you are awake to move around.    Weight Bearing X Bear weight when you are able on your operated leg or foot in the cam boot. ? Bear weight only on the heel of your operated foot in the post-op shoe. ? Do not bear any weight on the operated leg or foot.  Cast / Splint / Dressing X Keep your splint or cast clean and dry.  Don't put anything (coat hanger, pencil, etc) down inside of it.  If it gets damp, use a hair dryer on the cool setting to dry it.  If it gets soaked, call the office to schedule an appointment for a cast change. ? Remove your dressing 3 days after surgery and cover the incisions with dry dressings.    After your dressing, cast or splint is removed; you may shower, but do not soak or scrub the wound.  Allow the water to run over it, and then gently pat it dry.  Swelling It is normal for you to have swelling where you had surgery.  To reduce swelling and pain, keep your toes above your nose for at least 3 days after surgery.  It may be necessary to keep your foot or leg elevated for several weeks.  If it hurts, it should be elevated.  Follow Up Call my  office at [phone] when you are discharged from the hospital or surgery center to schedule an appointment to be seen two weeks after surgery.  Call my office at [phone] if you develop a fever >101.5 F, nausea, vomiting, bleeding from the surgical site or severe pain.     Post Anesthesia Home Care Instructions  Activity: Get plenty of rest for the remainder of the day. A responsible adult should stay with you for 24 hours following the procedure.  For the next 24 hours, DO NOT: -Drive a car -Advertising copywriter -Drink alcoholic beverages -Take any medication unless instructed by your physician -Make any legal decisions or sign important papers.  Meals: Start with liquid foods such as gelatin or soup. Progress to regular foods as tolerated. Avoid greasy, spicy, heavy foods. If nausea and/or vomiting occur, drink only clear liquids until the nausea and/or vomiting subsides. Call your physician if vomiting continues.  Special Instructions/Symptoms: Your throat may feel dry or sore from the anesthesia or the breathing tube placed in your throat during surgery. If this causes discomfort, gargle with warm salt water. The discomfort should disappear within 24 hours.  Regional Anesthesia Blocks  1. Numbness or the inability to move the "blocked" extremity may last from 3-48 hours after placement. The length of time depends on the medication injected and your individual  response to the medication. If the numbness is not going away after 48 hours, call your surgeon.  2. The extremity that is blocked will need to be protected until the numbness is gone and the  Strength has returned. Because you cannot feel it, you will need to take extra care to avoid injury. Because it may be weak, you may have difficulty moving it or using it. You may not know what position it is in without looking at it while the block is in effect.  3. For blocks in the legs and feet, returning to weight bearing and  walking needs to be done carefully. You will need to wait until the numbness is entirely gone and the strength has returned. You should be able to move your leg and foot normally before you try and bear weight or walk. You will need someone to be with you when you first try to ensure you do not fall and possibly risk injury.  4. Bruising and tenderness at the needle site are common side effects and will resolve in a few days.  5. Persistent numbness or new problems with movement should be communicated to the surgeon or the [facility] [phone] [facility] [phone]).

## 2012-02-01 NOTE — Anesthesia Postprocedure Evaluation (Signed)
Anesthesia Post Note  Patient: Wendy [name]  Procedure(s) Performed: Procedure(s) (LRB): RECONSTRUCTION ANKLE (Right)  Anesthesia type: General  Patient location: PACU  Post pain: Pain level controlled  Post assessment: Patient's Cardiovascular Status Stable  Last Vitals:  Filed Vitals:   02/01/12 0935  BP: 121/71  Pulse: 80  Temp: 36.7 C  Resp: 16    Post vital signs: Reviewed and stable  Level of consciousness: alert  Complications: No apparent anesthesia complications

## 2012-02-01 NOTE — H&P (Signed)
Wendy [name] is an 46 y.o. female.   Chief Complaint: right foot pain HPI: 46 y/o female with pain at dorsolateral right foot.  Advanced imaging shows a calcaneonavicular coalition.  She presents now for excision having failed non-operative treatment.  Past Medical History  Diagnosis Date  . Cervical disc disease     decreased range of motion  . Endometriosis     Lupron injection Q 3 mos.  Marland Kitchen PONV (postoperative nausea and vomiting)   . Fluid retention in legs   . Asthma     daily and prn inhalers  . Injury, brachial plexus     left  . Depression   . Anxiety   . Multiple allergies     takes allergy shots  . Wears partial dentures     upper partial  . 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 Deformity, hand     left  . Chronic pain     shoulders, neck, lower back    Past Surgical History  Procedure Date  . Rib resection     left - cervical rib removal  . Shoulder arthroscopy w/ rotator cuff repair 07/2011    right  . Ovarian cyst removal 2000  . Shoulder surgery     left  . Multiple tooth extractions     upper teeth and wisdom teeth  . Tooth extraction     x 1  . Diagnostic laparoscopy 10/02/2008    peritoneal bx.    History reviewed. No pertinent family history. Social History:  reports that she has quit smoking. She has never used smokeless tobacco. She reports that she does not drink alcohol or use illicit drugs.  Allergies:  Allergies  Allergen Reactions  . Fish-Derived Products Nausea And Vomiting  . Peanut-Containing Drug Products Hives  . Soap Rash    Medications Prior to Admission  Medication Sig Dispense Refill  . albuterol (PROVENTIL HFA;VENTOLIN HFA) 108 (90 BASE) MCG/ACT inhaler Inhale 2 puffs into the lungs as needed.      . beclomethasone (QVAR) 80 MCG/ACT inhaler Inhale 1 puff into the lungs as needed.      . cetirizine (ZYRTEC) 10 MG tablet Take 10 mg by mouth daily.      . cholecalciferol (VITAMIN D) 1000 UNITS  tablet Take 1,000 Units by mouth daily.      . clonazePAM (KLONOPIN) 0.5 MG tablet Take 0.5 mg by mouth 2 (two) times daily.      . cyclobenzaprine (FLEXERIL) 10 MG tablet Take 10 mg by mouth 2 (two) times daily.       Marland Kitchen EPINEPHrine (EPIPEN JR) 0.15 MG/0.3ML injection Inject 0.15 mg into the muscle as needed.      Marland Kitchen escitalopram (LEXAPRO) 20 MG tablet Take 20 mg by mouth daily. 1 - 1.5 TAB DAILY      . furosemide (LASIX) 20 MG tablet Take 10 mg by mouth as needed.       . gabapentin (NEURONTIN) 400 MG capsule Take 400 mg by mouth 3 (three) times daily.       . hydrOXYzine (ATARAX/VISTARIL) 50 MG tablet Take 50 mg by mouth 3 (three) times daily as needed.      . methocarbamol (ROBAXIN) 500 MG tablet Take 500 mg by mouth 4 (four) times daily.      . potassium chloride (KLOR-CON) 20 MEQ packet Take 20 mEq by mouth 2 (two) times daily.      . QUEtiapine (SEROQUEL) 100 MG tablet Take  300 mg by mouth at bedtime.       . triamcinolone cream (KENALOG) 0.1 % Apply topically 2 (two) times daily.      . meloxicam (MOBIC) 15 MG tablet Take 15 mg by mouth daily.          ROS  No recent f/c/n/v/wt loss.  Blood pressure 114/78, pulse 64, temperature 98.7 F (37.1 C), temperature source Oral, resp. rate 20, height 4\' 11"  (1.499 m), weight 81.647 kg (180 lb), SpO2 100.00%. Physical Exam wn wd woman in nad.  A and O x 4.  Mood and afect normal. EOMI.  Resp unlabored.  R foot with decreased ROM in inversion and eversion of hindfoot.  TTP at anterior process of calc.  Skin healthy and intact.  Pulses palpable.  Sens to LT intact.  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  The risks and benefits of the alternative treatment options have been discussed in detail.  The patient wishes to proceed with surgery and specifically understands risks of bleeding, infection, nerve damage, blood clots, need for additional surgery, amputation and death.   Toni Arthurs February 26, 2012, 7:17 AM

## 2012-02-01 NOTE — Transfer of Care (Signed)
Immediate Anesthesia Transfer of Care Note  Patient: Wendy [name]  Procedure(s) Performed: Procedure(s) (LRB): RECONSTRUCTION ANKLE (Right)  Patient Location: PACU  Anesthesia Type: GA combined with regional for post-op pain  Level of Consciousness: awake, alert , oriented and patient cooperative  Airway & Oxygen Therapy: Patient Spontanous Breathing and Patient connected to face mask oxygen  Post-op Assessment: Report given to PACU RN and Post -op Vital signs reviewed and stable  Post vital signs: Reviewed and stable  Complications: No apparent anesthesia complications

## 2012-02-01 NOTE — Brief Op Note (Signed)
02/01/2012  8:42 AM  PATIENT:  Wendy [name]  46 y.o. female  PRE-OPERATIVE DIAGNOSIS:  Right calcaneonavicular coalition  POST-OPERATIVE DIAGNOSIS:  Right calcaneonavicular coalition  Procedure(s): Excision of right calcaneonavicular coalition 2.  Autograft fat interposition into coalition site 3.  fluoro  SURGEON:  Toni Arthurs, MD  ASSISTANT: n/a  ANESTHESIA:   General, regional  EBL:  minimal   TOURNIQUET:   Total Tourniquet Time Documented: Thigh (Right) - 42 minutes  COMPLICATIONS:  None apparent  DISPOSITION:  Extubated, awake and stable to recovery.  DICTATION ID:  [id number]

## 2012-02-01 NOTE — Progress Notes (Signed)
Assisted Dr. Frederick with right, ultrasound guided, popliteal/saphenous block. Side rails up, monitors on throughout procedure. See vital signs in flow sheet. Tolerated Procedure well. 

## 2012-02-06 ENCOUNTER — Encounter (HOSPITAL_BASED_OUTPATIENT_CLINIC_OR_DEPARTMENT_OTHER): Payer: Self-pay | Admitting: Orthopedic Surgery

## 2012-02-06 NOTE — Addendum Note (Signed)
Addendum  created 02/06/12 1032 by Jewel Baize Akin Yi, CRNA   Modules edited:Anesthesia Responsible Staff

## 2012-02-06 NOTE — Addendum Note (Signed)
Addendum  created 02/06/12 0840 by Lance Coon, CRNA   Modules edited:Anesthesia Responsible Staff

## 2012-02-06 NOTE — Addendum Note (Signed)
Addendum  created 02/06/12 0840 by Frankston Morgin Halls, CRNA   Modules edited:Anesthesia Responsible Staff    

## 2012-02-06 NOTE — Addendum Note (Signed)
Addendum  created 02/06/12 1032 by Asianae Minkler D Cullan Launer, CRNA   Modules edited:Anesthesia Responsible Staff    

## 2012-02-07 ENCOUNTER — Encounter (HOSPITAL_BASED_OUTPATIENT_CLINIC_OR_DEPARTMENT_OTHER): Payer: Self-pay

## 2012-07-10 ENCOUNTER — Other Ambulatory Visit (HOSPITAL_COMMUNITY): Payer: Self-pay | Admitting: Family Medicine

## 2012-07-10 DIAGNOSIS — Z1231 Encounter for screening mammogram for malignant neoplasm of breast: Secondary | ICD-10-CM

## 2012-07-31 ENCOUNTER — Ambulatory Visit (HOSPITAL_COMMUNITY)
Admission: RE | Admit: 2012-07-31 | Discharge: 2012-07-31 | Disposition: A | Discharge status: Home / Self Care | Payer: PRIVATE HEALTH INSURANCE | Source: Ambulatory Visit | Attending: Family Medicine | Admitting: Family Medicine

## 2012-07-31 DIAGNOSIS — Z1231 Encounter for screening mammogram for malignant neoplasm of breast: Secondary | ICD-10-CM

## 2012-08-06 ENCOUNTER — Other Ambulatory Visit (HOSPITAL_COMMUNITY): Payer: Self-pay | Admitting: Family Medicine

## 2012-08-06 DIAGNOSIS — R109 Unspecified abdominal pain: Secondary | ICD-10-CM

## 2012-08-09 ENCOUNTER — Ambulatory Visit (HOSPITAL_COMMUNITY)
Admission: RE | Admit: 2012-08-09 | Discharge: 2012-08-09 | Disposition: A | Discharge status: Home / Self Care | Payer: PRIVATE HEALTH INSURANCE | Source: Ambulatory Visit | Attending: Family Medicine | Admitting: Family Medicine

## 2012-08-09 DIAGNOSIS — K3189 Other diseases of stomach and duodenum: Secondary | ICD-10-CM | POA: Insufficient documentation

## 2012-08-09 DIAGNOSIS — R1310 Dysphagia, unspecified: Secondary | ICD-10-CM | POA: Insufficient documentation

## 2012-08-09 DIAGNOSIS — K449 Diaphragmatic hernia without obstruction or gangrene: Secondary | ICD-10-CM | POA: Insufficient documentation

## 2012-08-09 DIAGNOSIS — R109 Unspecified abdominal pain: Secondary | ICD-10-CM

## 2012-08-09 DIAGNOSIS — R1013 Epigastric pain: Secondary | ICD-10-CM | POA: Insufficient documentation

## 2012-09-26 ENCOUNTER — Other Ambulatory Visit: Payer: Self-pay | Admitting: Obstetrics

## 2012-09-26 DIAGNOSIS — Z79899 Other long term (current) drug therapy: Secondary | ICD-10-CM

## 2012-10-03 ENCOUNTER — Ambulatory Visit (HOSPITAL_COMMUNITY): Admission: RE | Admit: 2012-10-03 | Payer: PRIVATE HEALTH INSURANCE | Source: Ambulatory Visit

## 2012-10-20 ENCOUNTER — Emergency Department (HOSPITAL_COMMUNITY)
Admission: EM | Admit: 2012-10-20 | Discharge: 2012-10-21 | Disposition: A | Discharge status: Home / Self Care | Payer: Medicare Other | Attending: Emergency Medicine | Admitting: Emergency Medicine

## 2012-10-20 ENCOUNTER — Encounter (HOSPITAL_COMMUNITY): Payer: Self-pay | Admitting: *Deleted

## 2012-10-20 DIAGNOSIS — F3289 Other specified depressive episodes: Secondary | ICD-10-CM | POA: Insufficient documentation

## 2012-10-20 DIAGNOSIS — J301 Allergic rhinitis due to pollen: Secondary | ICD-10-CM | POA: Insufficient documentation

## 2012-10-20 DIAGNOSIS — Z8742 Personal history of other diseases of the female genital tract: Secondary | ICD-10-CM | POA: Insufficient documentation

## 2012-10-20 DIAGNOSIS — M545 Low back pain, unspecified: Secondary | ICD-10-CM | POA: Insufficient documentation

## 2012-10-20 DIAGNOSIS — T4995XA Adverse effect of unspecified topical agent, initial encounter: Secondary | ICD-10-CM | POA: Insufficient documentation

## 2012-10-20 DIAGNOSIS — R220 Localized swelling, mass and lump, head: Secondary | ICD-10-CM | POA: Insufficient documentation

## 2012-10-20 DIAGNOSIS — Z87798 Personal history of other (corrected) congenital malformations: Secondary | ICD-10-CM | POA: Insufficient documentation

## 2012-10-20 DIAGNOSIS — Z87828 Personal history of other (healed) physical injury and trauma: Secondary | ICD-10-CM | POA: Insufficient documentation

## 2012-10-20 DIAGNOSIS — Z791 Long term (current) use of non-steroidal anti-inflammatories (NSAID): Secondary | ICD-10-CM | POA: Insufficient documentation

## 2012-10-20 DIAGNOSIS — L299 Pruritus, unspecified: Secondary | ICD-10-CM | POA: Insufficient documentation

## 2012-10-20 DIAGNOSIS — Z8739 Personal history of other diseases of the musculoskeletal system and connective tissue: Secondary | ICD-10-CM | POA: Insufficient documentation

## 2012-10-20 DIAGNOSIS — Z87891 Personal history of nicotine dependence: Secondary | ICD-10-CM | POA: Insufficient documentation

## 2012-10-20 DIAGNOSIS — G8929 Other chronic pain: Secondary | ICD-10-CM | POA: Insufficient documentation

## 2012-10-20 DIAGNOSIS — R221 Localized swelling, mass and lump, neck: Secondary | ICD-10-CM | POA: Insufficient documentation

## 2012-10-20 DIAGNOSIS — L509 Urticaria, unspecified: Secondary | ICD-10-CM

## 2012-10-20 DIAGNOSIS — Z8639 Personal history of other endocrine, nutritional and metabolic disease: Secondary | ICD-10-CM | POA: Insufficient documentation

## 2012-10-20 DIAGNOSIS — F329 Major depressive disorder, single episode, unspecified: Secondary | ICD-10-CM | POA: Insufficient documentation

## 2012-10-20 DIAGNOSIS — M25519 Pain in unspecified shoulder: Secondary | ICD-10-CM | POA: Insufficient documentation

## 2012-10-20 DIAGNOSIS — J45909 Unspecified asthma, uncomplicated: Secondary | ICD-10-CM | POA: Insufficient documentation

## 2012-10-20 DIAGNOSIS — Z862 Personal history of diseases of the blood and blood-forming organs and certain disorders involving the immune mechanism: Secondary | ICD-10-CM | POA: Insufficient documentation

## 2012-10-20 DIAGNOSIS — Z79899 Other long term (current) drug therapy: Secondary | ICD-10-CM | POA: Insufficient documentation

## 2012-10-20 DIAGNOSIS — Z7982 Long term (current) use of aspirin: Secondary | ICD-10-CM | POA: Insufficient documentation

## 2012-10-20 DIAGNOSIS — M542 Cervicalgia: Secondary | ICD-10-CM | POA: Insufficient documentation

## 2012-10-20 DIAGNOSIS — F411 Generalized anxiety disorder: Secondary | ICD-10-CM | POA: Insufficient documentation

## 2012-10-20 DIAGNOSIS — L272 Dermatitis due to ingested food: Secondary | ICD-10-CM | POA: Insufficient documentation

## 2012-10-20 MED ORDER — PREDNISONE 20 MG PO TABS
60.0000 mg | ORAL_TABLET | Freq: Once | ORAL | Status: AC
  Administered 2012-10-21: 60 mg via ORAL
  Filled 2012-10-20: qty 3

## 2012-10-20 MED ORDER — PREDNISONE 20 MG PO TABS: ORAL_TABLET | ORAL | Status: DC

## 2012-10-20 NOTE — ED Notes (Signed)
Per Epic chart History. Pt is allergic to peanuts, and shell fish, and some soap.

## 2012-10-20 NOTE — ED Notes (Signed)
Pt states that she had an allergic reaction about a week ago.  She states that she cannot identify any triggers.  She states that when she woke up this evening she was swollen on her forehead, L hand, and R elbow.  Pt denies any difficulty with breathing.

## 2012-10-20 NOTE — ED Notes (Addendum)
Pt states that she woke up with swelling to the forehead, hands left arm and elbows. Denies Hx of allergic reactions to Food or medication. Pt states that the swelling has gone down. Denies Swelling of throat, tongue, lips, or difficulty breathing. Pt able to speak full sentences without difficulty. Recently had her medication changed to Keppra (last week).

## 2012-10-20 NOTE — ED Provider Notes (Signed)
History     CSN: [id number]  Arrival date & time 10/20/12  2153   First MD Initiated Contact with Patient 10/20/12 2335      Chief Complaint  Patient presents with  . Allergic Reaction    (Consider location/radiation/quality/duration/timing/severity/associated sxs/prior treatment) HPI Comments: Patient with Hx multiple "allergic reactions" including hives for the past several years   Is currently receiving allergy shots and takes PO medication on a regular basis.  For the past 2 weeks has been having an increase in episodes of hives for which she had taken PO  Atarax with almost total resolution but is concerned that she is having so many episodes back to back.  Today had hives on her forehead that were slightly painful and have not totally resolved.  Denies SOB, difficulty swallowing.  Has been on Prednisone in the past but not for a while  Hives started before change in medications   Patient is a 47 y.o. female presenting with allergic reaction. The history is provided by the patient.  Allergic Reaction The primary symptoms are  urticaria. The primary symptoms do not include wheezing, shortness of breath, cough, nausea, vomiting, diarrhea, dizziness, palpitations, rash or angioedema. The current episode started more than 2 days ago. The problem has been gradually improving. This is a new problem.  The urticaria began 13 to 24 hours ago. The urticaria has been gradually improving since its onset.  Significant symptoms also include itching. Significant symptoms that are not present include flushing or rhinorrhea.    Past Medical History  Diagnosis Date  . Cervical disc disease     decreased range of motion  . Endometriosis     Lupron injection Q 3 mos.  Marland Kitchen PONV (postoperative nausea and vomiting)   . Fluid retention in legs   . Asthma     daily and prn inhalers  . Injury, brachial plexus     left  . Depression   . Anxiety   . Multiple allergies     takes allergy shots  . Wears  partial dentures     upper partial  . 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 Deformity, hand     left  . Chronic pain     shoulders, neck, lower back    Past Surgical History  Procedure Laterality Date  . Rib resection      left - cervical rib removal  . Shoulder arthroscopy w/ rotator cuff repair  07/2011    right  . Ovarian cyst removal  2000  . Shoulder surgery      left  . Multiple tooth extractions      upper teeth and wisdom teeth  . Tooth extraction      x 1  . Diagnostic laparoscopy  10/02/2008    peritoneal bx.  . Ankle reconstruction  02/01/2012    Procedure: RECONSTRUCTION ANKLE;  Surgeon: Toni Arthurs, MD;  Location:  SURGERY CENTER;  Service: Orthopedics;  Laterality: Right;  Excision of right calcaneonavicular coalition with autologus fat graft interposition    No family history on file.  History  Substance Use Topics  . Smoking status: Former Games developer  . Smokeless tobacco: Never Used     Comment: quit smoking 12/2011  . Alcohol Use: No    OB History   Grav Para Term Preterm Abortions TAB SAB Ect Mult Living                  Review of Systems  Constitutional: Negative for fever and chills.  HENT: Positive for facial swelling and neck stiffness. Negative for congestion, sore throat, rhinorrhea, mouth sores, trouble swallowing and neck pain.   Eyes: Negative.   Respiratory: Negative for cough, shortness of breath and wheezing.   Cardiovascular: Negative for palpitations.  Gastrointestinal: Negative for nausea, vomiting and diarrhea.  Genitourinary: Negative for dysuria.  Musculoskeletal: Negative for myalgias.  Skin: Positive for itching. Negative for flushing and rash.  Allergic/Immunologic: Positive for environmental allergies and food allergies.  Neurological: Negative for dizziness, weakness, numbness and headaches.  All other systems reviewed and are negative.    Allergies  Fish-derived products;  Peanut-containing drug products; and Soap  Home Medications   Current Outpatient Rx  Name  Route  Sig  Dispense  Refill  . albuterol (PROVENTIL HFA;VENTOLIN HFA) 108 (90 BASE) MCG/ACT inhaler   Inhalation   Inhale 2 puffs into the lungs as needed.         . ASPIRIN PO   Oral   Take 2 tablets by mouth once.         . beclomethasone (QVAR) 80 MCG/ACT inhaler   Inhalation   Inhale 1 puff into the lungs 2 (two) times daily.          . cetirizine (ZYRTEC) 10 MG tablet   Oral   Take 10 mg by mouth daily.         . clonazePAM (KLONOPIN) 0.5 MG tablet   Oral   Take 0.5 mg by mouth 2 (two) times daily.         . cyclobenzaprine (FLEXERIL) 10 MG tablet   Oral   Take 10 mg by mouth 2 (two) times daily as needed. For pain         . ergocalciferol (VITAMIN D2) 50000 UNITS capsule   Oral   Take 50,000 Units by mouth once a week. Sundays         . escitalopram (LEXAPRO) 20 MG tablet   Oral   Take 20 mg by mouth daily.          Marland Kitchen estrogens, conjugated, (PREMARIN) 0.625 MG tablet   Oral   Take 0.625 mg by mouth daily. Take daily for 21 days then do not take for 7 days.         Marland Kitchen gabapentin (NEURONTIN) 400 MG capsule   Oral   Take 400 mg by mouth 3 (three) times daily.          Marland Kitchen HYDROcodone-acetaminophen (NORCO/VICODIN) 5-325 MG per tablet   Oral   Take 1 tablet by mouth every 6 (six) hours as needed for pain.         Marland Kitchen levETIRAcetam (KEPPRA) 250 MG tablet   Oral   Take 250 mg by mouth 2 (two) times daily.         . meloxicam (MOBIC) 15 MG tablet   Oral   Take 15 mg by mouth daily.         . QUEtiapine (SEROQUEL) 100 MG tablet   Oral   Take 100 mg by mouth at bedtime.          . rosuvastatin (CRESTOR) 10 MG tablet   Oral   Take 10 mg by mouth daily.         Marland Kitchen EPINEPHrine (EPIPEN JR) 0.15 MG/0.3ML injection   Intramuscular   Inject 0.15 mg into the muscle as needed.         . furosemide (LASIX) 20 MG tablet   Oral  Take 10 mg by  mouth as needed. For swelling         . hydrOXYzine (ATARAX/VISTARIL) 50 MG tablet   Oral   Take 50 mg by mouth 3 (three) times daily as needed. For itching         . methocarbamol (ROBAXIN) 500 MG tablet   Oral   Take 500 mg by mouth 4 (four) times daily as needed. For pain         . potassium chloride (KLOR-CON) 20 MEQ packet   Oral   Take 20 mEq by mouth 2 (two) times daily as needed. When taking furosemide         . triamcinolone cream (KENALOG) 0.1 %   Topical   Apply 1 application topically 2 (two) times daily as needed. For rash           BP 168/84  Pulse 78  Temp(Src) 98 F (36.7 C) (Oral)  Resp 18  SpO2 100%  Physical Exam  Nursing note and vitals reviewed. Constitutional: She is oriented to person, place, and time. She appears well-developed and well-nourished.  HENT:  Head: Normocephalic.    Right Ear: External ear normal.  Left Ear: External ear normal.  Eyes: Pupils are equal, round, and reactive to light.  Neck: Normal range of motion.  Cardiovascular: Normal rate.   Pulmonary/Chest: Effort normal.  Musculoskeletal: Normal range of motion. She exhibits no edema.  Lymphadenopathy:    She has no cervical adenopathy.  Neurological: She is alert and oriented to person, place, and time.  Skin: Skin is warm.    ED Course  Procedures (including critical care time)  Labs Reviewed - No data to display No results found.   No diagnosis found.    MDM   Will start Prednisone taper due to increased number of episodes and have patient follow up with her allergist         Arman Filter, NP 10/21/12 0000

## 2012-10-21 NOTE — ED Provider Notes (Signed)
Medical screening examination/treatment/procedure(s) were performed by non-physician practitioner and as supervising physician I was immediately available for consultation/collaboration.  Sunnie Nielsen, MD 10/21/12 [phone]

## 2012-10-25 ENCOUNTER — Ambulatory Visit (HOSPITAL_COMMUNITY): Payer: PRIVATE HEALTH INSURANCE

## 2012-10-29 ENCOUNTER — Ambulatory Visit (HOSPITAL_COMMUNITY)
Admission: RE | Admit: 2012-10-29 | Discharge: 2012-10-29 | Disposition: A | Discharge status: Home / Self Care | Payer: Medicare Other | Source: Ambulatory Visit | Attending: Obstetrics | Admitting: Obstetrics

## 2012-10-29 DIAGNOSIS — Z79899 Other long term (current) drug therapy: Secondary | ICD-10-CM

## 2012-12-09 ENCOUNTER — Telehealth: Payer: Self-pay | Admitting: *Deleted

## 2012-12-09 DIAGNOSIS — R102 Pelvic and perineal pain: Secondary | ICD-10-CM

## 2012-12-09 NOTE — Telephone Encounter (Signed)
Pt states she is overdue for her Lupron. Pt states she needs her prescription for the injection sent to [facility] [employer] [facility].

## 2012-12-11 MED ORDER — LEUPROLIDE ACETATE (3 MONTH) 11.25 MG IM KIT: 11.2500 mg | PACK | INTRAMUSCULAR | Status: DC

## 2012-12-17 ENCOUNTER — Ambulatory Visit: Payer: Self-pay

## 2012-12-18 ENCOUNTER — Ambulatory Visit: Payer: Self-pay

## 2012-12-19 ENCOUNTER — Ambulatory Visit (INDEPENDENT_AMBULATORY_CARE_PROVIDER_SITE_OTHER): Payer: Medicare Other | Admitting: *Deleted

## 2012-12-19 VITALS — BP 115/70 | HR 82 | Temp 98.3°F | Ht 59.0 in | Wt 186.0 lb

## 2012-12-19 DIAGNOSIS — Z79818 Long term (current) use of other agents affecting estrogen receptors and estrogen levels: Secondary | ICD-10-CM

## 2012-12-19 DIAGNOSIS — Z3202 Encounter for pregnancy test, result negative: Secondary | ICD-10-CM

## 2012-12-19 NOTE — Progress Notes (Signed)
Pt in office today for a Lupron injection. Pt had her last injection 09-02-12. Pt was due to have her next injection 11-24-12. Pregnancy test performed in office today-results negative. Pt instructed to abstain from intercourse and to return in two weeks for a second pregnancy test, upon negative results Lupron injection will be given.

## 2013-01-10 ENCOUNTER — Ambulatory Visit: Payer: Medicare Other

## 2013-01-17 ENCOUNTER — Other Ambulatory Visit (INDEPENDENT_AMBULATORY_CARE_PROVIDER_SITE_OTHER): Payer: Medicare Other | Admitting: *Deleted

## 2013-01-17 ENCOUNTER — Encounter: Payer: Medicare Other | Admitting: *Deleted

## 2013-01-17 VITALS — BP 127/82 | HR 91 | Temp 98.5°F | Ht 59.0 in | Wt 183.0 lb

## 2013-01-17 DIAGNOSIS — R102 Pelvic and perineal pain: Secondary | ICD-10-CM

## 2013-01-17 DIAGNOSIS — N809 Endometriosis, unspecified: Secondary | ICD-10-CM

## 2013-01-17 MED ORDER — LEUPROLIDE ACETATE (3 MONTH) 11.25 MG IM KIT
11.2500 mg | PACK | INTRAMUSCULAR | Status: AC
  Administered 2013-01-17: 11.25 mg via INTRAMUSCULAR

## 2013-01-17 MED ORDER — LEUPROLIDE ACETATE 3.75 MG IM KIT: 11.2500 mg | PACK | INTRAMUSCULAR | Status: DC

## 2013-01-17 MED ORDER — LEUPROLIDE ACETATE (3 MONTH) 11.25 MG IM KIT: 11.2500 mg | PACK | INTRAMUSCULAR | Status: DC

## 2013-01-17 NOTE — Progress Notes (Signed)
Patient is in office for Lupron injection. Pt is late for her shot, but has been cleared by Dr Tamela Oddi to get her injection today.

## 2013-01-23 NOTE — Progress Notes (Signed)
This encounter was created in error - please disregard.

## 2013-02-04 ENCOUNTER — Encounter: Payer: Medicare Other | Admitting: Obstetrics

## 2013-03-05 ENCOUNTER — Ambulatory Visit: Payer: Medicare Other | Admitting: Obstetrics

## 2013-03-06 ENCOUNTER — Encounter: Payer: Self-pay | Admitting: Obstetrics

## 2013-03-06 ENCOUNTER — Ambulatory Visit (INDEPENDENT_AMBULATORY_CARE_PROVIDER_SITE_OTHER): Payer: Medicare Other | Admitting: Obstetrics

## 2013-03-06 VITALS — BP 122/81 | HR 77 | Temp 98.6°F | Ht 59.0 in | Wt 186.2 lb

## 2013-03-06 DIAGNOSIS — B9689 Other specified bacterial agents as the cause of diseases classified elsewhere: Secondary | ICD-10-CM

## 2013-03-06 DIAGNOSIS — N760 Acute vaginitis: Secondary | ICD-10-CM

## 2013-03-06 DIAGNOSIS — IMO0002 Reserved for concepts with insufficient information to code with codable children: Secondary | ICD-10-CM

## 2013-03-06 DIAGNOSIS — L98499 Non-pressure chronic ulcer of skin of other sites with unspecified severity: Secondary | ICD-10-CM

## 2013-03-06 DIAGNOSIS — A499 Bacterial infection, unspecified: Secondary | ICD-10-CM

## 2013-03-06 MED ORDER — METRONIDAZOLE 500 MG PO TABS: 500.0000 mg | ORAL_TABLET | Freq: Two times a day (BID) | ORAL | Status: DC

## 2013-03-06 NOTE — Addendum Note (Signed)
Addended by: Julaine Hua on: 03/06/2013 06:10 PM   Modules accepted: Orders

## 2013-03-06 NOTE — Progress Notes (Addendum)
.   Subjective:     Wendy [name] is a 47 y.o. female here for a problem exam.  Current complaints: Vaginal odor and two painful lesions around the outside of her vagina for about 5 days..  Personal health questionnaire reviewed: yes.   Gynecologic History No LMP recorded. Patient has had an injection. Contraception: none Last Pap: 09/2012. Results were: normal Last mammogram:2013. Results were: normal  Obstetric History OB History   Grav Para Term Preterm Abortions TAB SAB Ect Mult Living                   The following portions of the patient's history were reviewed and updated as appropriate: allergies, current medications, past family history, past medical history, past social history, past surgical history and problem list.  Review of Systems Pertinent items are noted in HPI.    Objective:    General appearance: alert and no distress Abdomen: normal findings: soft, non-tender Pelvic: cervix normal in appearance, external genitalia normal, no adnexal masses or tenderness, no cervical motion tenderness, uterus normal size, shape, and consistency and vagina normal without discharge  Ulcerated area upper inner thigh near perineum-left side.  Assessment:    BV  R/O Herpes.  Herpes culture done.   Plan:    Education reviewed: safe sex/STD prevention.   Diagnosis and management of Herpes.  Flagyl Rx.  F/U prn.

## 2013-03-07 LAB — WET PREP BY MOLECULAR PROBE: Gardnerella vaginalis: POSITIVE — AB

## 2013-03-11 ENCOUNTER — Other Ambulatory Visit: Payer: Self-pay | Admitting: *Deleted

## 2013-03-11 NOTE — Progress Notes (Signed)
Flagyl 500mg po bid x 7days

## 2013-03-12 LAB — HERPES SIMPLEX VIRUS CULTURE: Organism ID, Bacteria: NOT DETECTED

## 2013-03-21 ENCOUNTER — Encounter: Payer: Self-pay | Admitting: Obstetrics

## 2013-03-31 ENCOUNTER — Emergency Department (HOSPITAL_COMMUNITY)
Admission: EM | Admit: 2013-03-31 | Discharge: 2013-03-31 | Disposition: A | Discharge status: Home / Self Care | Payer: Medicare Other | Attending: Emergency Medicine | Admitting: Emergency Medicine

## 2013-03-31 ENCOUNTER — Emergency Department (HOSPITAL_COMMUNITY): Payer: Medicare Other

## 2013-03-31 ENCOUNTER — Encounter (HOSPITAL_COMMUNITY): Payer: Self-pay | Admitting: Emergency Medicine

## 2013-03-31 DIAGNOSIS — F3289 Other specified depressive episodes: Secondary | ICD-10-CM | POA: Insufficient documentation

## 2013-03-31 DIAGNOSIS — M79671 Pain in right foot: Secondary | ICD-10-CM

## 2013-03-31 DIAGNOSIS — G8929 Other chronic pain: Secondary | ICD-10-CM | POA: Insufficient documentation

## 2013-03-31 DIAGNOSIS — J45909 Unspecified asthma, uncomplicated: Secondary | ICD-10-CM | POA: Insufficient documentation

## 2013-03-31 DIAGNOSIS — Z79899 Other long term (current) drug therapy: Secondary | ICD-10-CM | POA: Insufficient documentation

## 2013-03-31 DIAGNOSIS — M79609 Pain in unspecified limb: Secondary | ICD-10-CM | POA: Insufficient documentation

## 2013-03-31 DIAGNOSIS — Z8739 Personal history of other diseases of the musculoskeletal system and connective tissue: Secondary | ICD-10-CM | POA: Insufficient documentation

## 2013-03-31 DIAGNOSIS — N809 Endometriosis, unspecified: Secondary | ICD-10-CM | POA: Insufficient documentation

## 2013-03-31 DIAGNOSIS — F17200 Nicotine dependence, unspecified, uncomplicated: Secondary | ICD-10-CM | POA: Insufficient documentation

## 2013-03-31 DIAGNOSIS — F329 Major depressive disorder, single episode, unspecified: Secondary | ICD-10-CM | POA: Insufficient documentation

## 2013-03-31 DIAGNOSIS — F411 Generalized anxiety disorder: Secondary | ICD-10-CM | POA: Insufficient documentation

## 2013-03-31 DIAGNOSIS — M5090 Cervical disc disorder, unspecified, unspecified cervical region: Secondary | ICD-10-CM | POA: Insufficient documentation

## 2013-03-31 DIAGNOSIS — Z8679 Personal history of other diseases of the circulatory system: Secondary | ICD-10-CM | POA: Insufficient documentation

## 2013-03-31 MED ORDER — IBUPROFEN 400 MG PO TABS
800.0000 mg | ORAL_TABLET | Freq: Once | ORAL | Status: AC
  Administered 2013-03-31: 800 mg via ORAL
  Filled 2013-03-31: qty 2

## 2013-03-31 MED ORDER — HYDROCODONE-ACETAMINOPHEN 5-325 MG PO TABS: 1.0000 | ORAL_TABLET | Freq: Four times a day (QID) | ORAL | Status: DC | PRN

## 2013-03-31 NOTE — ED Notes (Signed)
Patient states she started having foot pain x 2 wks ago.   Patient describes as shooting/sharp pain that makes it difficult for her to put weight on the foot.  Patient states she has been taking hydrocodone at home for pain.

## 2013-03-31 NOTE — ED Provider Notes (Signed)
History    This chart was scribed for non-physician practitioner Arnoldo Hooker, PA-C, working with Carleene Cooper III, MD by Donne Anon, ED Scribe. This patient was seen in room TR09C/TR09C and the patient's care was started at 1511.  CSN: [id number] Arrival date & time 03/31/13  1449  First MD Initiated Contact with Patient 03/31/13 1511     Chief Complaint  Patient presents with  . Foot Pain    The history is provided by the patient. No language interpreter was used.   HPI Comments: Wendy [name] is a 47 y.o. female who presents to the Emergency Department complaining of 2 weeks of gradual onset, gradually worsening, waxing and waning right foot pain and swelling described as shooting and sharp and on the dorsal surface of her foot. She states she had right foot surgery and ankle reconstruction 1 year ago by Dr. Victorino Dike. She denies any recent injury or trauma, but states that this pain is similar to the pain she experienced before the surgery. She states the pain is worse at night and when she walks on her foot. She is prescribed hydrocodone by a neurosurgeon for her chronic shoulder and neck pain. She has not taken the hydrocodone for 2 weeks because she ran out.   Past Medical History  Diagnosis Date  . Cervical disc disease     decreased range of motion  . Endometriosis     Lupron injection Q 3 mos.  Marland Kitchen PONV (postoperative nausea and vomiting)   . Fluid retention in legs   . Asthma     daily and prn inhalers  . Injury, brachial plexus     left  . Depression   . Anxiety   . Multiple allergies     takes allergy shots  . Wears partial dentures     upper partial  . 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 Deformity, hand     left  . Chronic pain     shoulders, neck, lower back   Past Surgical History  Procedure Laterality Date  . Rib resection      left - cervical rib removal  . Shoulder arthroscopy w/ rotator cuff repair  07/2011    right   . Ovarian cyst removal  2000  . Shoulder surgery      left  . Multiple tooth extractions      upper teeth and wisdom teeth  . Tooth extraction      x 1  . Diagnostic laparoscopy  10/02/2008    peritoneal bx.  . Ankle reconstruction  02/01/2012    Procedure: RECONSTRUCTION ANKLE;  Surgeon: Toni Arthurs, MD;  Location: [facility];  Service: Orthopedics;  Laterality: Right;  Excision of right calcaneonavicular coalition with autologus fat graft interposition   No family history on file. History  Substance Use Topics  . Smoking status: Current Every Day Smoker -- 0.50 packs/day for 20 years    Types: Cigarettes  . Smokeless tobacco: Never Used  . Alcohol Use: No   OB History   Grav Para Term Preterm Abortions TAB SAB Ect Mult Living                 Review of Systems  Musculoskeletal: Positive for joint swelling and arthralgias.  All other systems reviewed and are negative.    Allergies  Fish-derived products; Peanut-containing drug products; and Soap  Home Medications   Current Outpatient Rx  Name  Route  Sig  Dispense  Refill  . albuterol (PROVENTIL HFA;VENTOLIN HFA) 108 (90 BASE) MCG/ACT inhaler   Inhalation   Inhale 2 puffs into the lungs as needed.         . beclomethasone (QVAR) 80 MCG/ACT inhaler   Inhalation   Inhale 1 puff into the lungs 2 (two) times daily.          . cetirizine (ZYRTEC) 10 MG tablet   Oral   Take 10 mg by mouth daily.         . clonazePAM (KLONOPIN) 0.5 MG tablet   Oral   Take 0.5 mg by mouth 2 (two) times daily.         . cyclobenzaprine (FLEXERIL) 10 MG tablet   Oral   Take 10 mg by mouth 2 (two) times daily as needed. For pain         . EPINEPHrine (EPIPEN JR) 0.15 MG/0.3ML injection   Intramuscular   Inject 0.15 mg into the muscle as needed.         . ergocalciferol (VITAMIN D2) 50000 UNITS capsule   Oral   Take 50,000 Units by mouth once a week. Sundays         . escitalopram (LEXAPRO) 20 MG  tablet   Oral   Take 20 mg by mouth daily.          Marland Kitchen estrogens, conjugated, (PREMARIN) 0.625 MG tablet   Oral   Take 0.625 mg by mouth daily. Take daily for 21 days then do not take for 7 days.         . furosemide (LASIX) 20 MG tablet   Oral   Take 10 mg by mouth as needed. For swelling         . gabapentin (NEURONTIN) 400 MG capsule   Oral   Take 400 mg by mouth 3 (three) times daily.          Marland Kitchen HYDROcodone-acetaminophen (NORCO/VICODIN) 5-325 MG per tablet   Oral   Take 1 tablet by mouth every 6 (six) hours as needed for pain.         . hydrOXYzine (ATARAX/VISTARIL) 50 MG tablet   Oral   Take 50 mg by mouth 3 (three) times daily as needed. For itching         . leuprolide (LUPRON DEPOT) 11.25 MG injection   Intramuscular   Inject 11.25 mg into the muscle every 3 (three) months.   1 each   2   . levETIRAcetam (KEPPRA) 250 MG tablet   Oral   Take 250 mg by mouth 2 (two) times daily.         . potassium chloride (KLOR-CON) 20 MEQ packet   Oral   Take 20 mEq by mouth 2 (two) times daily as needed. When taking furosemide         . QUEtiapine (SEROQUEL) 100 MG tablet   Oral   Take 100 mg by mouth at bedtime.          . rosuvastatin (CRESTOR) 10 MG tablet   Oral   Take 10 mg by mouth daily.         Marland Kitchen triamcinolone cream (KENALOG) 0.1 %   Topical   Apply 1 application topically 2 (two) times daily as needed. For rash          BP 127/79  Pulse 86  Temp(Src) 98 F (36.7 C) (Oral)  Resp 18  SpO2 96%  Physical Exam  Nursing note and vitals reviewed. Constitutional: She  appears well-developed and well-nourished. No distress.  HENT:  Head: Normocephalic and atraumatic.  Eyes: Conjunctivae are normal.  Neck: Neck supple. No tracheal deviation present.  Cardiovascular: Normal rate.   Pulmonary/Chest: Effort normal. No respiratory distress.  Musculoskeletal: Normal range of motion.  Neurological: She is alert.  Skin: Skin is warm and dry.   Well healed cervical scar laterally right foot dorsally. No obvious swelling. No discoloration or bony deformity. Tender dorsally in general distribution.   Psychiatric: She has a normal mood and affect. Her behavior is normal.    ED Course  Procedures (including critical care time) DIAGNOSTIC STUDIES: Oxygen Saturation is 96% on RA, adequate by my interpretation.   Dg Foot Complete Right  03/31/2013   *RADIOLOGY REPORT*  Clinical Data: Right foot pain for 2 weeks, no known injury  RIGHT FOOT COMPLETE - 3+ VIEW  Comparison: 07/30/2010  Findings: Bone mineralization normal. Joint spaces preserved. No fracture, dislocation, or bone destruction.  IMPRESSION: No acute osseous abnormalities.   Original Report Authenticated By: Ulyses Southward, M.D.   COORDINATION OF CARE: 3:14 PM Discussed treatment plan which includes right foot xray with pt at bedside and pt agreed to plan.    Labs Reviewed - No data to display No results found. No diagnosis found. 1. Right foot pian MDM  Uncomplicated pain in foot without injury. She reports being out of her Norco for 2 weeks, which may lend to unmasking pain that has been potentially chronic. She also now reports having been seen by Dr. Victorino Dike 3 weeks ago and had an injection to the foot, which may have caused onset of discomfort. Encouraged to follow up outpatient.  I personally performed the services described in this documentation, which was scribed in my presence. The recorded information has been reviewed and is accurate.      Arnoldo Hooker, PA-C 03/31/13 1605

## 2013-04-01 NOTE — ED Provider Notes (Signed)
Medical screening examination/treatment/procedure(s) were performed by non-physician practitioner and as supervising physician I was immediately available for consultation/collaboration.   Carleene Cooper III, MD 04/01/13 1259

## 2013-04-10 ENCOUNTER — Other Ambulatory Visit: Payer: Medicare Other

## 2013-04-14 ENCOUNTER — Ambulatory Visit (INDEPENDENT_AMBULATORY_CARE_PROVIDER_SITE_OTHER): Payer: Medicaid Other | Admitting: *Deleted

## 2013-04-14 VITALS — BP 129/79 | HR 70 | Temp 98.1°F | Wt 186.2 lb

## 2013-04-14 DIAGNOSIS — N809 Endometriosis, unspecified: Secondary | ICD-10-CM

## 2013-04-14 NOTE — Patient Instructions (Signed)
Please return to office as schedule and as needed. Please call pharmacy for refills and pick-up prescription before next appointment.  

## 2013-06-04 ENCOUNTER — Emergency Department (HOSPITAL_COMMUNITY)
Admission: EM | Admit: 2013-06-04 | Discharge: 2013-06-04 | Disposition: A | Discharge status: Home / Self Care | Payer: Medicare Other | Attending: Emergency Medicine | Admitting: Emergency Medicine

## 2013-06-04 ENCOUNTER — Encounter (HOSPITAL_COMMUNITY): Payer: Self-pay | Admitting: Emergency Medicine

## 2013-06-04 ENCOUNTER — Emergency Department (HOSPITAL_COMMUNITY): Payer: Medicare Other

## 2013-06-04 DIAGNOSIS — R109 Unspecified abdominal pain: Secondary | ICD-10-CM | POA: Insufficient documentation

## 2013-06-04 DIAGNOSIS — Z87440 Personal history of urinary (tract) infections: Secondary | ICD-10-CM | POA: Insufficient documentation

## 2013-06-04 DIAGNOSIS — J45909 Unspecified asthma, uncomplicated: Secondary | ICD-10-CM | POA: Insufficient documentation

## 2013-06-04 DIAGNOSIS — F329 Major depressive disorder, single episode, unspecified: Secondary | ICD-10-CM | POA: Insufficient documentation

## 2013-06-04 DIAGNOSIS — M546 Pain in thoracic spine: Secondary | ICD-10-CM | POA: Insufficient documentation

## 2013-06-04 DIAGNOSIS — G8929 Other chronic pain: Secondary | ICD-10-CM | POA: Insufficient documentation

## 2013-06-04 DIAGNOSIS — Z9109 Other allergy status, other than to drugs and biological substances: Secondary | ICD-10-CM | POA: Insufficient documentation

## 2013-06-04 DIAGNOSIS — F3289 Other specified depressive episodes: Secondary | ICD-10-CM | POA: Insufficient documentation

## 2013-06-04 DIAGNOSIS — Z79899 Other long term (current) drug therapy: Secondary | ICD-10-CM | POA: Insufficient documentation

## 2013-06-04 DIAGNOSIS — Z98811 Dental restoration status: Secondary | ICD-10-CM | POA: Insufficient documentation

## 2013-06-04 DIAGNOSIS — R319 Hematuria, unspecified: Secondary | ICD-10-CM | POA: Insufficient documentation

## 2013-06-04 DIAGNOSIS — R3915 Urgency of urination: Secondary | ICD-10-CM | POA: Insufficient documentation

## 2013-06-04 DIAGNOSIS — Q742 Other congenital malformations of lower limb(s), including pelvic girdle: Secondary | ICD-10-CM | POA: Insufficient documentation

## 2013-06-04 DIAGNOSIS — F411 Generalized anxiety disorder: Secondary | ICD-10-CM | POA: Insufficient documentation

## 2013-06-04 DIAGNOSIS — F17200 Nicotine dependence, unspecified, uncomplicated: Secondary | ICD-10-CM | POA: Insufficient documentation

## 2013-06-04 DIAGNOSIS — M5090 Cervical disc disorder, unspecified, unspecified cervical region: Secondary | ICD-10-CM | POA: Insufficient documentation

## 2013-06-04 DIAGNOSIS — R110 Nausea: Secondary | ICD-10-CM | POA: Insufficient documentation

## 2013-06-04 LAB — BASIC METABOLIC PANEL
BUN: 7 mg/dL (ref 6–23)
CO2: 24 mEq/L (ref 19–32)
Calcium: 9.2 mg/dL (ref 8.4–10.5)
Chloride: 105 mEq/L (ref 96–112)
Creatinine, Ser: 0.71 mg/dL (ref 0.50–1.10)
GFR calc Af Amer: >90 mL/min (ref >90)
GFR calc non Af Amer: >90 mL/min (ref >90)
Glucose, Bld: 99 mg/dL (ref 70–99)
Potassium: 3.5 mEq/L (ref 3.5–5.1)
Sodium: 140 mEq/L (ref 135–145)

## 2013-06-04 LAB — CBC
HCT: 39.6 % (ref 36.0–46.0)
Hemoglobin: 12.7 g/dL (ref 12.0–15.0)
MCH: 28 pg (ref 26.0–34.0)
MCHC: 32.1 g/dL (ref 30.0–36.0)
MCV: 87.2 fL (ref 78.0–100.0)
Platelets: 245 10*3/uL (ref 150–400)
RBC: 4.54 MIL/uL (ref 3.87–5.11)
RDW: 13.1 % (ref 11.5–15.5)
WBC: 6.2 10*3/uL (ref 4.0–10.5)

## 2013-06-04 LAB — URINALYSIS, ROUTINE W REFLEX MICROSCOPIC
Bilirubin Urine: NEGATIVE
Glucose, UA: NEGATIVE mg/dL
Hgb urine dipstick: NEGATIVE
Ketones, ur: NEGATIVE mg/dL
Nitrite: NEGATIVE
Protein, ur: NEGATIVE mg/dL
Specific Gravity, Urine: 1.018 (ref 1.005–1.030)
Urobilinogen, UA: 1 mg/dL (ref 0.0–1.0)
pH: 7.5 (ref 5.0–8.0)

## 2013-06-04 LAB — URINE MICROSCOPIC-ADD ON

## 2013-06-04 MED ORDER — TIZANIDINE HCL 4 MG PO TABS: 4.0000 mg | ORAL_TABLET | Freq: Four times a day (QID) | ORAL | Status: DC | PRN

## 2013-06-04 MED ORDER — CYCLOBENZAPRINE HCL 10 MG PO TABS
10.0000 mg | ORAL_TABLET | Freq: Once | ORAL | Status: AC
  Administered 2013-06-04: 10 mg via ORAL
  Filled 2013-06-04: qty 1

## 2013-06-04 MED ORDER — OXYCODONE-ACETAMINOPHEN 5-325 MG PO TABS
1.0000 | ORAL_TABLET | Freq: Once | ORAL | Status: AC
  Administered 2013-06-04: 1 via ORAL
  Filled 2013-06-04: qty 1

## 2013-06-04 MED ORDER — NYSTATIN 100000 UNIT/GM EX CREA: TOPICAL_CREAM | CUTANEOUS | Status: DC

## 2013-06-04 MED ORDER — KETOROLAC TROMETHAMINE 60 MG/2ML IM SOLN
60.0000 mg | Freq: Once | INTRAMUSCULAR | Status: AC
  Administered 2013-06-04: 60 mg via INTRAMUSCULAR
  Filled 2013-06-04: qty 2

## 2013-06-04 MED ORDER — OXYCODONE-ACETAMINOPHEN 5-325 MG PO TABS: 2.0000 | ORAL_TABLET | ORAL | Status: DC | PRN

## 2013-06-04 MED ORDER — DIAZEPAM 5 MG PO TABS
5.0000 mg | ORAL_TABLET | Freq: Once | ORAL | Status: AC
  Administered 2013-06-04: 5 mg via ORAL
  Filled 2013-06-04: qty 1

## 2013-06-04 MED ORDER — OXYCODONE-ACETAMINOPHEN 5-325 MG PO TABS
2.0000 | ORAL_TABLET | Freq: Once | ORAL | Status: AC
  Administered 2013-06-04: 2 via ORAL
  Filled 2013-06-04: qty 2

## 2013-06-04 NOTE — ED Provider Notes (Signed)
CSN: [id number]     Arrival date & time 06/04/13  1478 History   This chart was scribed for non-physician practitioner Junius Finner, PA-C, working with Loren Racer, MD by Dorothey Baseman, ED Scribe. This patient was seen in room TR06C/TR06C and the patient's care was started at 10:02 AM.    Chief Complaint  Patient presents with  . Back Pain   The history is provided by the patient. No language interpreter was used.   HPI Comments: Wendy [name] is a 47 y.o. female with a history of chronic back pain associated with degenerative disc disease who presents to the Emergency Department complaining of a constant, sharp, right-sided, lower back pain onset 3-4 days ago that has been progressively worsening. Patient reports that this pain does not feel like her chronic pain symptoms. She reports associated RLQ abdominal pain, urinary frequency that she received medication for, mild hematuria, and nausea. She states that she took laxatives at home without relief. She denies fever and emesis. She denies any potential injury to the area or recent heavy lifting. She denies a history of kidney stones or infections. She reports a history of bladder infections. She denies any allergies to medications. Patient takes Flexeril to treat her chronic back pain.   Past Medical History  Diagnosis Date  . Cervical disc disease     decreased range of motion  . Endometriosis     Lupron injection Q 3 mos.  Marland Kitchen PONV (postoperative nausea and vomiting)   . Fluid retention in legs   . Asthma     daily and prn inhalers  . Injury, brachial plexus     left  . Depression   . Anxiety   . Multiple allergies     takes allergy shots  . Wears partial dentures     upper partial  . 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 Deformity, hand     left  . Chronic pain     shoulders, neck, lower back   Past Surgical History  Procedure Laterality Date  . Rib resection      left - cervical rib removal   . Shoulder arthroscopy w/ rotator cuff repair  07/2011    right  . Ovarian cyst removal  2000  . Shoulder surgery      left  . Multiple tooth extractions      upper teeth and wisdom teeth  . Tooth extraction      x 1  . Diagnostic laparoscopy  10/02/2008    peritoneal bx.  . Ankle reconstruction  02/01/2012    Procedure: RECONSTRUCTION ANKLE;  Surgeon: Toni Arthurs, MD;  Location: [facility];  Service: Orthopedics;  Laterality: Right;  Excision of right calcaneonavicular coalition with autologus fat graft interposition   No family history on file. History  Substance Use Topics  . Smoking status: Current Every Day Smoker -- 0.50 packs/day for 20 years    Types: Cigarettes  . Smokeless tobacco: Never Used  . Alcohol Use: No   OB History   Grav Para Term Preterm Abortions TAB SAB Ect Mult Living                 Review of Systems  Constitutional: Negative for fever.  Gastrointestinal: Positive for nausea and abdominal pain. Negative for vomiting.  Genitourinary: Positive for frequency and hematuria.  All other systems reviewed and are negative.    Allergies  Fish-derived products; Peanut-containing drug products; and Soap  Home  Medications   Current Outpatient Rx  Name  Route  Sig  Dispense  Refill  . albuterol (PROVENTIL HFA;VENTOLIN HFA) 108 (90 BASE) MCG/ACT inhaler   Inhalation   Inhale 2 puffs into the lungs as needed.         . beclomethasone (QVAR) 80 MCG/ACT inhaler   Inhalation   Inhale 1 puff into the lungs 2 (two) times daily.          . cetirizine (ZYRTEC) 10 MG tablet   Oral   Take 10 mg by mouth daily.         . clonazePAM (KLONOPIN) 0.5 MG tablet   Oral   Take 0.5 mg by mouth 2 (two) times daily.         . cyclobenzaprine (FLEXERIL) 10 MG tablet   Oral   Take 10 mg by mouth 2 (two) times daily as needed. For pain         . docusate sodium (COLACE) 100 MG capsule   Oral   Take 100 mg by mouth 2 (two) times daily.          Marland Kitchen EPINEPHrine (EPIPEN JR) 0.15 MG/0.3ML injection   Intramuscular   Inject 0.15 mg into the muscle as needed.         . ergocalciferol (VITAMIN D2) 50000 UNITS capsule   Oral   Take 50,000 Units by mouth once a week. Sundays         . escitalopram (LEXAPRO) 20 MG tablet   Oral   Take 20 mg by mouth daily.          Marland Kitchen estradiol (ESTRACE) 1 MG tablet   Oral   Take 1 mg by mouth daily.         Marland Kitchen estrogens, conjugated, (PREMARIN) 0.625 MG tablet   Oral   Take 0.625 mg by mouth daily. Take daily for 21 days then do not take for 7 days.         . furosemide (LASIX) 20 MG tablet   Oral   Take 10 mg by mouth as needed. For swelling         . gabapentin (NEURONTIN) 400 MG capsule   Oral   Take 400 mg by mouth 3 (three) times daily.          Marland Kitchen HYDROcodone-acetaminophen (NORCO/VICODIN) 5-325 MG per tablet   Oral   Take 1 tablet by mouth every 6 (six) hours as needed for pain.   10 tablet   0   . leuprolide (LUPRON DEPOT) 11.25 MG injection   Intramuscular   Inject 11.25 mg into the muscle every 3 (three) months.   1 each   2   . levETIRAcetam (KEPPRA) 250 MG tablet   Oral   Take 250 mg by mouth 2 (two) times daily.         . potassium chloride SA (K-DUR,KLOR-CON) 20 MEQ tablet   Oral   Take 20 mEq by mouth 2 (two) times daily as needed.         Marland Kitchen QUEtiapine (SEROQUEL) 100 MG tablet   Oral   Take 100 mg by mouth at bedtime.          . rosuvastatin (CRESTOR) 10 MG tablet   Oral   Take 10 mg by mouth daily.         Marland Kitchen triamcinolone cream (KENALOG) 0.1 %   Topical   Apply 1 application topically 2 (two) times daily as needed. For rash         .  nystatin cream (MYCOSTATIN)      Apply to affected area 2 times daily   30 g   1   . oxyCODONE-acetaminophen (PERCOCET/ROXICET) 5-325 MG per tablet   Oral   Take 2 tablets by mouth every 4 (four) hours as needed for pain.   12 tablet   0   . tiZANidine (ZANAFLEX) 4 MG tablet   Oral   Take 1  tablet (4 mg total) by mouth every 6 (six) hours as needed.   30 tablet   0    Triage Vitals: BP 127/90  Pulse 87  Temp(Src) 97 F (36.1 C) (Oral)  Resp 24  Ht 4\' 11"  (1.499 m)  Wt 190 lb (86.183 kg)  BMI 38.35 kg/m2  SpO2 98%  Physical Exam  Nursing note and vitals reviewed. Constitutional: She appears well-developed and well-nourished. No distress.  Pacing back and forth, leaning over the counter. Appears to be in moderate discomfort.   HENT:  Head: Normocephalic and atraumatic.  Eyes: Conjunctivae are normal. No scleral icterus.  Neck: Normal range of motion.  Cardiovascular: Normal rate, regular rhythm and normal heart sounds.   Pulmonary/Chest: Effort normal and breath sounds normal. No respiratory distress. She has no wheezes. She has no rales. She exhibits no tenderness.  Abdominal: Soft. Bowel sounds are normal. She exhibits no distension and no mass. There is no tenderness. There is no rebound and no guarding.  Musculoskeletal: Normal range of motion.  Tenderness to palpation over the upper trapezius (chronic) and right flank.   Neurological: She is alert.  Skin: Skin is warm and dry. She is not diaphoretic.    ED Course  Procedures (including critical care time)  Medications  oxyCODONE-acetaminophen (PERCOCET/ROXICET) 5-325 MG per tablet 1 tablet (1 tablet Oral Given 06/04/13 1016)  diazepam (VALIUM) tablet 5 mg (5 mg Oral Given 06/04/13 1016)  ketorolac (TORADOL) injection 60 mg (60 mg Intramuscular Given 06/04/13 1249)  oxyCODONE-acetaminophen (PERCOCET/ROXICET) 5-325 MG per tablet 2 tablet (2 tablets Oral Given 06/04/13 1530)  cyclobenzaprine (FLEXERIL) tablet 10 mg (10 mg Oral Given 06/04/13 1530)    DIAGNOSTIC STUDIES: Oxygen Saturation is 98% on room air, normal by my interpretation.    COORDINATION OF CARE: 10:07AM- Will order Valium and Percocet to manage pain symptoms. Discussed treatment plan with patient at bedside and patient verbalized agreement.    10:51AM- Patient will be moved to Pod A under the care of [facility], PA-C, as the patient's pain has not improved at all after the administration of pain medication. Discussed treatment plan with patient at bedside and patient verbalized agreement.   Labs Review Labs Reviewed  URINALYSIS, ROUTINE W REFLEX MICROSCOPIC - Abnormal; Notable for the following:    Color, Urine AMBER (*)    APPearance CLOUDY (*)    Leukocytes, UA SMALL (*)    All other components within normal limits  URINE MICROSCOPIC-ADD ON - Abnormal; Notable for the following:    Bacteria, UA FEW (*)    Crystals TRIPLE PHOSPHATE CRYSTALS (*)    All other components within normal limits  CBC  BASIC METABOLIC PANEL   Imaging Review No results found.  MDM   1. Flank pain    Pt with hx of chronic back pain c/o severe right flank pain.  Pt denied any recent injuries, heavy lifting or falls. Stated it felt different than previous back pain.  Denies fever, n/v/d. No hx of renal stones. Denies urinary symptoms.  Attempted to tx pain in fast track, however pt  still in severe pain after given percocet and valium.  Pt moved to acute care area for better pain control and further workup.  Basic blood work order. Discussed pt with Emilia Beck PA-C who agreed to take over pt care and determine pt dx and dispo.   I personally performed the services described in this documentation, which was scribed in my presence. The recorded information has been reviewed and is accurate.    Junius Finner, PA-C 06/10/13 1630

## 2013-06-04 NOTE — ED Notes (Signed)
Patient states that she has chronic back pain with degenerative disc.   Patient states "this is different.  This pain is more to the R side."   Patient complains of R flank pain.    Patient states that the pain is radiating into R shoulder.

## 2013-06-04 NOTE — ED Provider Notes (Signed)
11:34 AM Patient signed out to me by Junius Finner, PA-C for R flank pain. This pain is unlike patient's chronic back pain. Urinalysis unremarkable. Patient given Percocet and Valium for pain. Labs pending. Vitals stable and patient afebrile.   12:20 PM Labs pending. Patient requesting pain medication. Patient will have IM toradol. I will order CT abdomen pelvis without contrast to rule out kidney stone despite no blood in urine.   3:13 PM CT scan shows no uretal stone. Patient likely has muscle pain due due change in position exacerbating the pain. Patient will have Percocet and Tizanadine for pain. Patient instructed to follow up with her PCP for further evaluation or return to the ED with worsening or concerning symptoms.   Wendy Beck, PA-C 06/04/13 1514

## 2013-06-10 NOTE — ED Provider Notes (Signed)
Medical screening examination/treatment/procedure(s) were performed by non-physician practitioner and as supervising physician I was immediately available for consultation/collaboration.   Yoshino Broccoli J Alazne Quant, MD 06/10/13 2258 

## 2013-06-13 NOTE — ED Provider Notes (Signed)
Medical screening examination/treatment/procedure(s) were performed by non-physician practitioner and as supervising physician I was immediately available for consultation/collaboration.   Loren Racer, MD 06/13/13 [phone]

## 2013-07-15 ENCOUNTER — Ambulatory Visit: Payer: Medicare Other | Admitting: *Deleted

## 2014-01-23 ENCOUNTER — Other Ambulatory Visit: Payer: Medicare Other | Admitting: *Deleted

## 2014-03-24 ENCOUNTER — Encounter (HOSPITAL_COMMUNITY): Payer: Self-pay | Admitting: Emergency Medicine

## 2014-03-24 ENCOUNTER — Emergency Department (HOSPITAL_COMMUNITY): Payer: Medicare Other

## 2014-03-24 ENCOUNTER — Emergency Department (HOSPITAL_COMMUNITY)
Admission: EM | Admit: 2014-03-24 | Discharge: 2014-03-25 | Disposition: A | Discharge status: Home / Self Care | Payer: Medicare Other | Attending: Emergency Medicine | Admitting: Emergency Medicine

## 2014-03-24 DIAGNOSIS — M5090 Cervical disc disorder, unspecified, unspecified cervical region: Secondary | ICD-10-CM | POA: Insufficient documentation

## 2014-03-24 DIAGNOSIS — R0600 Dyspnea, unspecified: Secondary | ICD-10-CM

## 2014-03-24 DIAGNOSIS — Z91040 Latex allergy status: Secondary | ICD-10-CM | POA: Insufficient documentation

## 2014-03-24 DIAGNOSIS — R229 Localized swelling, mass and lump, unspecified: Secondary | ICD-10-CM | POA: Insufficient documentation

## 2014-03-24 DIAGNOSIS — Z79899 Other long term (current) drug therapy: Secondary | ICD-10-CM | POA: Insufficient documentation

## 2014-03-24 DIAGNOSIS — R062 Wheezing: Secondary | ICD-10-CM | POA: Insufficient documentation

## 2014-03-24 DIAGNOSIS — L209 Atopic dermatitis, unspecified: Secondary | ICD-10-CM

## 2014-03-24 DIAGNOSIS — R21 Rash and other nonspecific skin eruption: Secondary | ICD-10-CM | POA: Insufficient documentation

## 2014-03-24 DIAGNOSIS — R059 Cough, unspecified: Secondary | ICD-10-CM | POA: Insufficient documentation

## 2014-03-24 DIAGNOSIS — F329 Major depressive disorder, single episode, unspecified: Secondary | ICD-10-CM | POA: Insufficient documentation

## 2014-03-24 DIAGNOSIS — Q742 Other congenital malformations of lower limb(s), including pelvic girdle: Secondary | ICD-10-CM | POA: Insufficient documentation

## 2014-03-24 DIAGNOSIS — M21939 Unspecified acquired deformity of unspecified forearm: Secondary | ICD-10-CM | POA: Insufficient documentation

## 2014-03-24 DIAGNOSIS — R0609 Other forms of dyspnea: Secondary | ICD-10-CM | POA: Insufficient documentation

## 2014-03-24 DIAGNOSIS — G8929 Other chronic pain: Secondary | ICD-10-CM | POA: Insufficient documentation

## 2014-03-24 DIAGNOSIS — R0989 Other specified symptoms and signs involving the circulatory and respiratory systems: Secondary | ICD-10-CM | POA: Insufficient documentation

## 2014-03-24 DIAGNOSIS — Z98811 Dental restoration status: Secondary | ICD-10-CM | POA: Insufficient documentation

## 2014-03-24 DIAGNOSIS — Z9109 Other allergy status, other than to drugs and biological substances: Secondary | ICD-10-CM | POA: Insufficient documentation

## 2014-03-24 DIAGNOSIS — L2089 Other atopic dermatitis: Secondary | ICD-10-CM | POA: Insufficient documentation

## 2014-03-24 DIAGNOSIS — F3289 Other specified depressive episodes: Secondary | ICD-10-CM | POA: Insufficient documentation

## 2014-03-24 DIAGNOSIS — N8000 Endometriosis of the uterus, unspecified: Secondary | ICD-10-CM | POA: Insufficient documentation

## 2014-03-24 DIAGNOSIS — F411 Generalized anxiety disorder: Secondary | ICD-10-CM | POA: Insufficient documentation

## 2014-03-24 DIAGNOSIS — R609 Edema, unspecified: Secondary | ICD-10-CM | POA: Insufficient documentation

## 2014-03-24 DIAGNOSIS — J45909 Unspecified asthma, uncomplicated: Secondary | ICD-10-CM | POA: Insufficient documentation

## 2014-03-24 DIAGNOSIS — R05 Cough: Secondary | ICD-10-CM | POA: Insufficient documentation

## 2014-03-24 DIAGNOSIS — F17200 Nicotine dependence, unspecified, uncomplicated: Secondary | ICD-10-CM | POA: Insufficient documentation

## 2014-03-24 LAB — I-STAT TROPONIN, ED: TROPONIN I, POC: 0 ng/mL (ref 0.00–0.08)

## 2014-03-24 LAB — CBC
HCT: 37.5 % (ref 36.0–46.0)
HEMOGLOBIN: 12.1 g/dL (ref 12.0–15.0)
MCH: 28.2 pg (ref 26.0–34.0)
MCHC: 32.3 g/dL (ref 30.0–36.0)
MCV: 87.4 fL (ref 78.0–100.0)
Platelets: 248 10*3/uL (ref 150–400)
RBC: 4.29 MIL/uL (ref 3.87–5.11)
RDW: 13.7 % (ref 11.5–15.5)
WBC: 11.3 10*3/uL — ABNORMAL HIGH (ref 4.0–10.5)

## 2014-03-24 LAB — BASIC METABOLIC PANEL
Anion gap: 14 (ref 5–15)
BUN: 11 mg/dL (ref 6–23)
CALCIUM: 8.9 mg/dL (ref 8.4–10.5)
CO2: 27 mEq/L (ref 19–32)
Chloride: 101 mEq/L (ref 96–112)
Creatinine, Ser: 0.83 mg/dL (ref 0.50–1.10)
GFR calc Af Amer: >90 mL/min (ref >90)
GFR, EST NON AFRICAN AMERICAN: 82 mL/min — AB (ref >90)
Glucose, Bld: 82 mg/dL (ref 70–99)
Potassium: 3.3 mEq/L — ABNORMAL LOW (ref 3.7–5.3)
SODIUM: 142 meq/L (ref 137–147)

## 2014-03-24 LAB — PRO B NATRIURETIC PEPTIDE: Pro B Natriuretic peptide (BNP): 71.6 pg/mL (ref 0–125)

## 2014-03-24 MED ORDER — HYDROCORTISONE 2.5 % EX LOTN: TOPICAL_LOTION | Freq: Two times a day (BID) | CUTANEOUS | Status: DC

## 2014-03-24 NOTE — ED Notes (Signed)
MD at the bedside  

## 2014-03-24 NOTE — Discharge Instructions (Signed)
Continue taking prednisone as prescribed by your primary MD. Dennis Bast may take Benadryl as needed for swelling and itching. Return to the emergency department for worsening shortness of breath, rash, fever or for any concerns.  Eczema Eczema, also called atopic dermatitis, is a skin disorder that causes inflammation of the skin. It causes a red rash and dry, scaly skin. The skin becomes very itchy. Eczema is generally worse during the cooler winter months and often improves with the warmth of summer. Eczema usually starts showing signs in infancy. Some children outgrow eczema, but it may last through adulthood.  CAUSES  The exact cause of eczema is not known, but it appears to run in families. People with eczema often have a family history of eczema, allergies, asthma, or hay fever. Eczema is not contagious. Flare-ups of the condition may be caused by:   Contact with something you are sensitive or allergic to.   Stress. SIGNS AND SYMPTOMS  Dry, scaly skin.   Red, itchy rash.   Itchiness. This may occur before the skin rash and may be very intense.  DIAGNOSIS  The diagnosis of eczema is usually made based on symptoms and medical history. TREATMENT  Eczema cannot be cured, but symptoms usually can be controlled with treatment and other strategies. A treatment plan might include:  Controlling the itching and scratching.   Use over-the-counter antihistamines as directed for itching. This is especially useful at night when the itching tends to be worse.   Use over-the-counter steroid creams as directed for itching.   Avoid scratching. Scratching makes the rash and itching worse. It may also result in a skin infection (impetigo) due to a break in the skin caused by scratching.   Keeping the skin well moisturized with creams every day. This will seal in moisture and help prevent dryness. Lotions that contain alcohol and water should be avoided because they can dry the skin.   Limiting  exposure to things that you are sensitive or allergic to (allergens).   Recognizing situations that cause stress.   Developing a plan to manage stress.  HOME CARE INSTRUCTIONS   Only take over-the-counter or prescription medicines as directed by your health care provider.   Do not use anything on the skin without checking with your health care provider.   Keep baths or showers short (5 minutes) in warm (not hot) water. Use mild cleansers for bathing. These should be unscented. You may add nonperfumed bath oil to the bath water. It is best to avoid soap and bubble bath.   Immediately after a bath or shower, when the skin is still damp, apply a moisturizing ointment to the entire body. This ointment should be a petroleum ointment. This will seal in moisture and help prevent dryness. The thicker the ointment, the better. These should be unscented.   Keep fingernails cut short. Children with eczema may need to wear soft gloves or mittens at night after applying an ointment.   Dress in clothes made of cotton or cotton blends. Dress lightly, because heat increases itching.   A child with eczema should stay away from anyone with fever blisters or cold sores. The virus that causes fever blisters (herpes simplex) can cause a serious skin infection in children with eczema. SEEK MEDICAL CARE IF:   Your itching interferes with sleep.   Your rash gets worse or is not better within 1 week after starting treatment.   You see pus or soft yellow scabs in the rash area.   You  have a fever.   You have a rash flare-up after contact with someone who has fever blisters.  Document Released: 08/25/2000 Document Revised: 06/18/2013 Document Reviewed: 03/31/2013 [facility] Patient Information 2015 [place], [place]. This information is not intended to replace advice given to you by your health care provider. Make sure you discuss any questions you have with your health care  provider. Allergies Allergies may happen from anything your body is sensitive to. This may be food, medicines, pollens, chemicals, and nearly anything around you in everyday life that produces allergens. An allergen is anything that causes an allergy producing substance. Heredity is often a factor in causing these problems. This means you may have some of the same allergies as your parents. Food allergies happen in all age groups. Food allergies are some of the most severe and life threatening. Some common food allergies are cow's milk, seafood, eggs, nuts, wheat, and soybeans. SYMPTOMS   Swelling around the mouth.  An itchy red rash or hives.  Vomiting or diarrhea.  Difficulty breathing. SEVERE ALLERGIC REACTIONS ARE LIFE-THREATENING. This reaction is called anaphylaxis. It can cause the mouth and throat to swell and cause difficulty with breathing and swallowing. In severe reactions only a trace amount of food (for example, peanut oil in a salad) may cause death within seconds. Seasonal allergies occur in all age groups. These are seasonal because they usually occur during the same season every year. They may be a reaction to molds, grass pollens, or tree pollens. Other causes of problems are house dust mite allergens, pet dander, and mold spores. The symptoms often consist of nasal congestion, a runny itchy nose associated with sneezing, and tearing itchy eyes. There is often an associated itching of the mouth and ears. The problems happen when you come in contact with pollens and other allergens. Allergens are the particles in the air that the body reacts to with an allergic reaction. This causes you to release allergic antibodies. Through a chain of events, these eventually cause you to release histamine into the blood stream. Although it is meant to be protective to the body, it is this release that causes your discomfort. This is why you were given anti-histamines to feel better. If you are  unable to pinpoint the offending allergen, it may be determined by skin or blood testing. Allergies cannot be cured but can be controlled with medicine. Hay fever is a collection of all or some of the seasonal allergy problems. It may often be treated with simple over-the-counter medicine such as diphenhydramine. Take medicine as directed. Do not drink alcohol or drive while taking this medicine. Check with your caregiver or package insert for child dosages. If these medicines are not effective, there are many new medicines your caregiver can prescribe. Stronger medicine such as nasal spray, eye drops, and corticosteroids may be used if the first things you try do not work well. Other treatments such as immunotherapy or desensitizing injections can be used if all else fails. Follow up with your caregiver if problems continue. These seasonal allergies are usually not life threatening. They are generally more of a nuisance that can often be handled using medicine. HOME CARE INSTRUCTIONS   If unsure what causes a reaction, keep a diary of foods eaten and symptoms that follow. Avoid foods that cause reactions.  If hives or rash are present:  Take medicine as directed.  You may use an over-the-counter antihistamine (diphenhydramine) for hives and itching as needed.  Apply cold compresses (cloths) to the  skin or take baths in cool water. Avoid hot baths or showers. Heat will make a rash and itching worse.  If you are severely allergic:  Following a treatment for a severe reaction, hospitalization is often required for closer follow-up.  Wear a medic-alert bracelet or necklace stating the allergy.  You and your family must learn how to give adrenaline or use an anaphylaxis kit.  If you have had a severe reaction, always carry your anaphylaxis kit or EpiPen with you. Use this medicine as directed by your caregiver if a severe reaction is occurring. Failure to do so could have a fatal outcome. SEEK  MEDICAL CARE IF:  You suspect a food allergy. Symptoms generally happen within 30 minutes of eating a food.  Your symptoms have not gone away within 2 days or are getting worse.  You develop new symptoms.  You want to retest yourself or your child with a food or drink you think causes an allergic reaction. Never do this if an anaphylactic reaction to that food or drink has happened before. Only do this under the care of a caregiver. SEEK IMMEDIATE MEDICAL CARE IF:   You have difficulty breathing, are wheezing, or have a tight feeling in your chest or throat.  You have a swollen mouth, or you have hives, swelling, or itching all over your body.  You have had a severe reaction that has responded to your anaphylaxis kit or an EpiPen. These reactions may return when the medicine has worn off. These reactions should be considered life threatening. MAKE SURE YOU:   Understand these instructions.  Will watch your condition.  Will get help right away if you are not doing well or get worse. Document Released: 11/21/2002 Document Revised: 12/23/2012 Document Reviewed: 04/27/2008 [facility] Patient Information 2015 [place], [place]. This information is not intended to replace advice given to you by your health care provider. Make sure you discuss any questions you have with your health care provider.

## 2014-03-24 NOTE — ED Provider Notes (Signed)
CSN: [id number]     Arrival date & time 03/24/14  2020 History   First MD Initiated Contact with Patient 03/24/14 2307     Chief Complaint  Patient presents with  . Shortness of Breath     (Consider location/radiation/quality/duration/timing/severity/associated sxs/prior Treatment) HPI Patient states she has a history of multiple allergies and environmental sensitivities presents with 9 days of episodic shortness of breath, swelling and rash. The rash started on her face and then now has progressed to her bilateral upper extremities. She describes the rash is itching. She currently denies any shortness of breath. She has had intermittent wheezing. He's had a dry cough especially at night. She denies any fevers or chills. She's had no chest pain. Patient states she stopped using her albuterol inhaler. This is roughly the same time the symptoms started. She continues to take Zyrtec daily. She was also started on a course of prednisone by her primary doctor which she stopped yesterday. Patient has had no fever or chills. She denies any new known allergen exposure. She does state that she has 3 dogs which "she is not supposed to have" due to her allergies. Past Medical History  Diagnosis Date  . Cervical disc disease     decreased range of motion  . Endometriosis     Lupron injection Q 3 mos.  Marland Kitchen PONV (postoperative nausea and vomiting)   . Fluid retention in legs   . Asthma     daily and prn inhalers  . Injury, brachial plexus     left  . Depression   . Anxiety   . Multiple allergies     takes allergy shots  . Wears partial dentures     upper partial  . 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 Deformity, hand     left  . Chronic pain     shoulders, neck, lower back   Past Surgical History  Procedure Laterality Date  . Rib resection      left - cervical rib removal  . Shoulder arthroscopy w/ rotator cuff repair  07/2011    right  . Ovarian cyst removal   2000  . Shoulder surgery      left  . Multiple tooth extractions      upper teeth and wisdom teeth  . Tooth extraction      x 1  . Diagnostic laparoscopy  10/02/2008    peritoneal bx.  . Ankle reconstruction  02/01/2012    Procedure: RECONSTRUCTION ANKLE;  Surgeon: Wylene Simmer, MD;  Location: [facility];  Service: Orthopedics;  Laterality: Right;  Excision of right calcaneonavicular coalition with autologus fat graft interposition   History reviewed. No pertinent family history. History  Substance Use Topics  . Smoking status: Current Every Day Smoker -- 0.50 packs/day for 20 years    Types: Cigarettes  . Smokeless tobacco: Never Used  . Alcohol Use: No   OB History   Grav Para Term Preterm Abortions TAB SAB Ect Mult Living                 Review of Systems  Constitutional: Negative for fever and chills.  HENT: Negative for congestion.   Respiratory: Positive for cough, shortness of breath and wheezing.   Cardiovascular: Positive for leg swelling. Negative for chest pain and palpitations.  Gastrointestinal: Negative for nausea, vomiting, abdominal pain and diarrhea.  Skin: Positive for rash. Negative for wound.  Neurological: Negative for dizziness, weakness, light-headedness, numbness and  headaches.  All other systems reviewed and are negative.     Allergies  Chocolate; Latex; Fish-derived products; Peanut-containing drug products; and Soap  Home Medications   Prior to Admission medications   Medication Sig Start Date End Date Taking? Authorizing Provider  albuterol (PROVENTIL HFA;VENTOLIN HFA) 108 (90 BASE) MCG/ACT inhaler Inhale 2 puffs into the lungs every 6 (six) hours as needed for wheezing or shortness of breath.    Yes Historical Provider, MD  beclomethasone (QVAR) 80 MCG/ACT inhaler Inhale 1 puff into the lungs 2 (two) times daily.    Yes Historical Provider, MD  cetirizine (ZYRTEC) 10 MG tablet Take 10 mg by mouth daily.   Yes Historical Provider,  MD  clonazePAM (KLONOPIN) 0.5 MG tablet Take 0.5 mg by mouth 2 (two) times daily.   Yes Historical Provider, MD  cyclobenzaprine (FLEXERIL) 10 MG tablet Take 10 mg by mouth 2 (two) times daily as needed. For pain   Yes Historical Provider, MD  docusate sodium (COLACE) 100 MG capsule Take 100 mg by mouth 2 (two) times daily.   Yes Historical Provider, MD  EPINEPHrine (EPIPEN JR) 0.15 MG/0.3ML injection Inject 0.15 mg into the muscle daily as needed for anaphylaxis.    Yes Historical Provider, MD  ergocalciferol (VITAMIN D2) 50000 UNITS capsule Take 50,000 Units by mouth once a week. Sundays   Yes Historical Provider, MD  escitalopram (LEXAPRO) 20 MG tablet Take 20 mg by mouth daily.    Yes Historical Provider, MD  estradiol (ESTRACE) 1 MG tablet Take 1 mg by mouth daily. 05/05/13  Yes Historical Provider, MD  estrogens, conjugated, (PREMARIN) 0.625 MG tablet Take 0.625 mg by mouth daily. Take daily for 21 days then do not take for 7 days.   Yes Historical Provider, MD  furosemide (LASIX) 20 MG tablet Take 10 mg by mouth as needed. For swelling   Yes Historical Provider, MD  gabapentin (NEURONTIN) 400 MG capsule Take 400 mg by mouth 3 (three) times daily.    Yes Historical Provider, MD  HYDROcodone-acetaminophen (NORCO/VICODIN) 5-325 MG per tablet Take 1 tablet by mouth every 6 (six) hours as needed for pain. 03/31/13  Yes Shari A Upstill, PA-C  nystatin cream (MYCOSTATIN) Apply 1 application topically 2 (two) times daily as needed for dry skin.   Yes Historical Provider, MD  potassium chloride SA (K-DUR,KLOR-CON) 20 MEQ tablet Take 20 mEq by mouth 2 (two) times daily as needed (when taking lasix).    Yes Historical Provider, MD  QUEtiapine (SEROQUEL) 100 MG tablet Take 100 mg by mouth at bedtime.    Yes Historical Provider, MD  rosuvastatin (CRESTOR) 10 MG tablet Take 10 mg by mouth daily.   Yes Historical Provider, MD  tiZANidine (ZANAFLEX) 4 MG tablet Take 4 mg by mouth every 6 (six) hours as needed  for muscle spasms.   Yes Historical Provider, MD  triamcinolone cream (KENALOG) 0.1 % Apply 1 application topically 2 (two) times daily as needed. For rash   Yes Historical Provider, MD   BP 137/54  Pulse 84  Temp(Src) 99 F (37.2 C) (Oral)  Resp 20  Ht 4\' 11"  (1.499 m)  Wt 195 lb 8 oz (88.678 kg)  BMI 39.47 kg/m2  SpO2 100% Physical Exam  Nursing note and vitals reviewed. Constitutional: She is oriented to person, place, and time. She appears well-developed and well-nourished. No distress.  HENT:  Head: Normocephalic and atraumatic.  Mouth/Throat: Oropharynx is clear and moist. No oropharyngeal exudate.  Eyes: EOM are normal. Pupils  are equal, round, and reactive to light.  Neck: Normal range of motion. Neck supple.  Cardiovascular: Normal rate and regular rhythm.  Exam reveals no gallop and no friction rub.   No murmur heard. Pulmonary/Chest: Effort normal and breath sounds normal. No respiratory distress. She has no wheezes. She has no rales. She exhibits no tenderness.  Abdominal: Soft. Bowel sounds are normal. She exhibits no distension and no mass. There is no tenderness. There is no rebound and no guarding.  Musculoskeletal: Normal range of motion. She exhibits no edema and no tenderness.  Very mild nonpitting edema bilateral lower extremities. She has no calf tenderness or swelling.  Neurological: She is alert and oriented to person, place, and time.  Moves all extremities without deficit. Sensation is grossly intact.  Skin: Skin is warm and dry. Rash (Erythematous papular ventral surface of her left forearm. No rash noted interdigitally.) noted. No erythema.  Psychiatric: She has a normal mood and affect. Her behavior is normal.    ED Course  Procedures (including critical care time) Labs Review Labs Reviewed  CBC - Abnormal; Notable for the following:    WBC 11.3 (*)    All other components within normal limits  BASIC METABOLIC PANEL - Abnormal; Notable for the  following:    Potassium 3.3 (*)    GFR calc non Af Amer 82 (*)    All other components within normal limits  PRO B NATRIURETIC PEPTIDE  I-STAT TROPOININ, ED    Imaging Review Dg Chest 2 View  03/24/2014   CLINICAL DATA:  Shortness of breath  EXAM: CHEST  2 VIEW  COMPARISON:  None.  FINDINGS: The heart size and mediastinal contours are within normal limits. Both lungs are clear. The visualized skeletal structures are unremarkable.  IMPRESSION: No active cardiopulmonary disease.   Electronically Signed   By: Inez Catalina M.D.   On: 03/24/2014 21:56     EKG Interpretation   Date/Time:  Tuesday March 24 2014 20:36:25 EDT Ventricular Rate:  87 PR Interval:  150 QRS Duration: 76 QT Interval:  390 QTC Calculation: 469 R Axis:   0 Text Interpretation:  Normal sinus rhythm Normal ECG Confirmed by  Lita Mains  MD, Chace Klippel ([id number]) on 03/24/2014 11:12:31 PM      MDM   Final diagnoses:  None    Patient symptoms likely allergic in nature. She's encouraged to finish her prednisone. Also encouraged her to use her inhaler for any shortness of breath. Will prescribe hydrocortisone cream for presumed atopic dermatitis. Do not suspect infection as cause of patient's symptoms. She has followup with her primary Dr. Return precautions given.    Julianne Rice, MD 03/24/14 [phone]

## 2014-03-24 NOTE — ED Notes (Signed)
Presents with SOB, rashes and swelling "all over my body. I feel very sick. I think there is an infection going on somewhere. I can't hold anything down. Nothing tastes right. MY face and arms and legs are broken out and breaking out, ever since she started on that prednisone" Endorses SOB with lying flat and exertion.  Reports non productive cough. . Mild edema to legs.

## 2014-03-25 NOTE — ED Notes (Signed)
Pt discharged home with all belongings, pt alert, oriented and ambulatory upon discharge, 1 new rx given, pt verbalizes understanding of all discharge instructions., pt drove self home, no narcotics given in ED. Pt refused wheel chair

## 2014-04-27 ENCOUNTER — Ambulatory Visit: Payer: Medicare Other | Admitting: Obstetrics

## 2014-05-21 ENCOUNTER — Ambulatory Visit: Payer: Medicare Other | Admitting: Obstetrics

## 2014-06-01 ENCOUNTER — Ambulatory Visit: Payer: Medicare Other | Admitting: Obstetrics & Gynecology

## 2014-06-15 ENCOUNTER — Encounter: Payer: Self-pay | Admitting: Obstetrics & Gynecology

## 2014-06-15 ENCOUNTER — Other Ambulatory Visit: Payer: Self-pay | Admitting: Obstetrics & Gynecology

## 2014-06-15 ENCOUNTER — Ambulatory Visit (INDEPENDENT_AMBULATORY_CARE_PROVIDER_SITE_OTHER): Payer: Medicare Other | Admitting: Obstetrics & Gynecology

## 2014-06-15 VITALS — BP 158/91 | HR 88 | Temp 97.3°F | Ht 59.0 in | Wt 192.2 lb

## 2014-06-15 DIAGNOSIS — N760 Acute vaginitis: Secondary | ICD-10-CM

## 2014-06-15 DIAGNOSIS — A499 Bacterial infection, unspecified: Secondary | ICD-10-CM

## 2014-06-15 DIAGNOSIS — Z01419 Encounter for gynecological examination (general) (routine) without abnormal findings: Secondary | ICD-10-CM

## 2014-06-15 DIAGNOSIS — Z23 Encounter for immunization: Secondary | ICD-10-CM

## 2014-06-15 DIAGNOSIS — Z3202 Encounter for pregnancy test, result negative: Secondary | ICD-10-CM

## 2014-06-15 LAB — POCT URINE PREGNANCY: Preg Test, Ur: NEGATIVE

## 2014-06-15 MED ORDER — METRONIDAZOLE 500 MG PO TABS: 500.0000 mg | ORAL_TABLET | Freq: Two times a day (BID) | ORAL | Status: DC

## 2014-06-15 NOTE — Progress Notes (Signed)
Subjective:     Wendy [name] is a 48 y.o. female here for a routine exam.  Current complaints: annual exam and consult on Lupron injection.     Personal health questionnaire:  Is patient Wendy [name], have a family history of breast and/or ovarian cancer: yes, Mother had breast cancer (30 years old)  and expired in 06/2007, Grandmother expired from ovarian cancer 21 years ago in October 4.   Is there a family history of uterine cancer diagnosed at age < 64, gastrointestinal cancer, urinary tract cancer, family member who is a Field seismologist syndrome-associated carrier: no Is the patient overweight and hypertensive, family history of diabetes, personal history of gestational diabetes or PCOS: no Is patient over 19, have PCOS,  family history of premature CHD under age 24, diabetes, smoke, have hypertension or peripheral artery disease:  no At any time, has a partner hit, kicked or otherwise hurt or frightened you?: no Over the past 2 weeks, have you felt down, depressed or hopeless?: yes Over the past 2 weeks, have you felt little interest or pleasure in doing things?:sometimes " Worry about a lot of things and taking care of my father.  My dad fell the other day."    Gynecologic History Patient's last menstrual period was 03/26/2014. Contraception: none Last Pap: results were: normal 09/25/2012 Last mammogram: 2013. Results were: normal  Obstetric History OB History  Gravida Para Term Preterm AB SAB TAB Ectopic Multiple Living  0 0 0 0 0 0 0 0 0 0         Past Medical History  Diagnosis Date  . Cervical disc disease     decreased range of motion  . Endometriosis     Lupron injection Q 3 mos.  Marland Kitchen PONV (postoperative nausea and vomiting)   . Fluid retention in legs   . Asthma     daily and prn inhalers  . Injury, brachial plexus     left  . Depression   . Anxiety   . Multiple allergies     takes allergy shots  . Wears partial dentures     upper partial  . 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 Deformity, hand     left  . Chronic pain     shoulders, neck, lower back    Past Surgical History  Procedure Laterality Date  . Rib resection      left - cervical rib removal  . Shoulder arthroscopy w/ rotator cuff repair  07/2011    right  . Ovarian cyst removal  2000  . Shoulder surgery      left  . Multiple tooth extractions      upper teeth and wisdom teeth  . Tooth extraction      x 1  . Diagnostic laparoscopy  10/02/2008    peritoneal bx.  . Ankle reconstruction  02/01/2012    Procedure: RECONSTRUCTION ANKLE;  Surgeon: Wylene Simmer, MD;  Location: [place];  Service: Orthopedics;  Laterality: Right;  Excision of right calcaneonavicular coalition with autologus fat graft interposition    Current outpatient prescriptions:albuterol (PROVENTIL HFA;VENTOLIN HFA) 108 (90 BASE) MCG/ACT inhaler, Inhale 2 puffs into the lungs every 6 (six) hours as needed for wheezing or shortness of breath. , Disp: , Rfl: ;  beclomethasone (QVAR) 80 MCG/ACT inhaler, Inhale 1 puff into the lungs 2 (two) times daily. , Disp: , Rfl: ;  cetirizine (ZYRTEC) 10 MG tablet, Take 10 mg by mouth daily., Disp: ,  Rfl:  clonazePAM (KLONOPIN) 0.5 MG tablet, Take 0.5 mg by mouth 2 (two) times daily., Disp: , Rfl: ;  cyclobenzaprine (FLEXERIL) 10 MG tablet, Take 10 mg by mouth 2 (two) times daily as needed. For pain, Disp: , Rfl: ;  docusate sodium (COLACE) 100 MG capsule, Take 100 mg by mouth 2 (two) times daily., Disp: , Rfl: ;  EPINEPHrine (EPIPEN JR) 0.15 MG/0.3ML injection, Inject 0.15 mg into the muscle daily as needed for anaphylaxis. , Disp: , Rfl:  ergocalciferol (VITAMIN D2) 50000 UNITS capsule, Take 50,000 Units by mouth once a week. Sundays, Disp: , Rfl: ;  escitalopram (LEXAPRO) 20 MG tablet, Take 20 mg by mouth daily. , Disp: , Rfl: ;  estradiol (ESTRACE) 1 MG tablet, Take 1 mg by mouth daily., Disp: , Rfl: ;  furosemide (LASIX) 20 MG tablet, Take 10  mg by mouth as needed. For swelling, Disp: , Rfl:  gabapentin (NEURONTIN) 400 MG capsule, Take 400 mg by mouth 3 (three) times daily. , Disp: , Rfl: ;  HYDROcodone-acetaminophen (NORCO/VICODIN) 5-325 MG per tablet, Take 1 tablet by mouth every 6 (six) hours as needed for pain., Disp: 10 tablet, Rfl: 0;  hydrocortisone 2.5 % lotion, Apply topically 2 (two) times daily., Disp: 59 mL, Rfl: 0 nystatin cream (MYCOSTATIN), Apply 1 application topically 2 (two) times daily as needed for dry skin., Disp: , Rfl: ;  potassium chloride SA (K-DUR,KLOR-CON) 20 MEQ tablet, Take 20 mEq by mouth 2 (two) times daily as needed (when taking lasix). , Disp: , Rfl: ;  QUEtiapine (SEROQUEL) 100 MG tablet, Take 100 mg by mouth at bedtime. , Disp: , Rfl: ;  rosuvastatin (CRESTOR) 10 MG tablet, Take 10 mg by mouth daily., Disp: , Rfl:  tiZANidine (ZANAFLEX) 4 MG tablet, Take 4 mg by mouth every 6 (six) hours as needed for muscle spasms., Disp: , Rfl: ;  triamcinolone cream (KENALOG) 0.1 %, Apply 1 application topically 2 (two) times daily as needed. For rash, Disp: , Rfl: ;  estrogens, conjugated, (PREMARIN) 0.625 MG tablet, Take 0.625 mg by mouth daily. Take daily for 21 days then do not take for 7 days., Disp: , Rfl:  metroNIDAZOLE (FLAGYL) 500 MG tablet, Take 1 tablet (500 mg total) by mouth 2 (two) times daily. For 7 days, Disp: 14 tablet, Rfl: 0 Allergies  Allergen Reactions  . Chocolate Hives  . Latex Hives, Itching and Swelling  . Fish-Derived Products Nausea And Vomiting  . Peanut-Containing Drug Products Hives  . Soap Rash    History  Substance Use Topics  . Smoking status: Current Every Day Smoker -- 0.50 packs/day for 20 years    Types: Cigarettes  . Smokeless tobacco: Never Used  . Alcohol Use: No    History reviewed. No pertinent family history.    Review of Systems  Constitutional: negative for fatigue and weight loss Respiratory: negative for cough and wheezing Cardiovascular: negative for chest  pain, fatigue and palpitations Gastrointestinal: negative for abdominal pain and change in bowel habits Musculoskeletal:negative for myalgias Neurological: negative for gait problems and tremors Behavioral/Psych: negative for abusive relationship, depression Endocrine: negative for temperature intolerance   Genitourinary: positive for vaginal discharge, dyspareunia Integument/breast: negative for breast lump, breast tenderness, nipple discharge and skin lesion(s)    Objective:       BP 158/91  Pulse 88  Temp(Src) 97.3 F (36.3 C)  Ht 4\' 11"  (1.499 m)  Wt 87.181 kg (192 lb 3.2 oz)  BMI 38.80 kg/m2  LMP 03/26/2014 General:  Alert, oriented x 4, well-developed, well-nourished AA female with no distress.   Skin:   no rash or abnormalities  Lungs:   clear to auscultation bilaterally  Heart:   regular rate and rhythm, S1, S2 normal, no murmur, click, rub or gallop  Breasts:   normal without suspicious masses, skin or nipple changes or axillary nodes  Abdomen:  normal findings: no organomegaly, soft, non-tender and no hernia  Pelvis:  External genitalia: normal general appearance Urinary system: urethral meatus normal and bladder without fullness, nontender Vaginal: thin white vaginal discharge Cervix: normal appearance Adnexa: normal bimanual exam Uterus: anteverted and non-tender, normal size   Lab Review Urine pregnancy test Labs reviewed no Radiologic studies reviewed no    Assessment:    Healthy female exam.   H/O endometriosis--no treatment at this time Likely BV Plan:  * Possible Mirena placement on next visit.  * Flu vaccine * STD testing, Wet prep,  * Pap smear * HIV/RPR panel  * Family h/o breast, ovarian cancer; offered Genetic counseling;To be [facility] to [facility].   * Offered smoking cessaton to patient.    Meds ordered this encounter  Medications  . metroNIDAZOLE (FLAGYL) 500 MG tablet    Sig: Take 1 tablet (500 mg total) by mouth 2  (two) times daily. For 7 days    Dispense:  14 tablet    Refill:  0   Orders Placed This Encounter  Procedures  . GC/Chlamydia Probe Amp  . Flu Vaccine QUAD 36+ mos IM (Fluarix)  . HIV antibody  . RPR  . POCT urine pregnancy  . POCT Wet Prep [facility])    Follow up in a few weeks

## 2014-06-15 NOTE — Patient Instructions (Signed)
Endometriosis Endometriosis is a condition in which the tissue that lines the uterus (endometrium) grows outside of its normal location. The tissue may grow in many locations close to the uterus, but it commonly grows on the ovaries, fallopian tubes, vagina, or bowel. Because the uterus expels, or sheds, its lining every menstrual cycle, there is bleeding wherever the endometrial tissue is located. This can cause pain because blood is irritating to tissues not normally exposed to it.  CAUSES  The cause of endometriosis is not known.  SIGNS AND SYMPTOMS  Often, there are no symptoms. When symptoms are present, they can vary with the location of the displaced tissue. Various symptoms can occur at different times. Although symptoms occur mainly during a woman's menstrual period, they can also occur midcycle and usually stop with menopause. Some people may go months with no symptoms at all. Symptoms may include:   Back or abdominal pain.   Heavier bleeding during periods.   Pain during intercourse.   Painful bowel movements.   Infertility. DIAGNOSIS  Your health care provider will do a physical exam and ask about your symptoms. Various tests may be done, such as:   Blood tests and urine tests. These are done to help rule out other problems.   Ultrasound. This test is done to look for abnormal tissue.   An X-ray of the lower bowel (barium enema).  Laparoscopy. In this procedure, a thin, lighted tube with a tiny camera on the end (laparoscope) is inserted into your abdomen. This helps your health care provider look for abnormal tissue to confirm the diagnosis. The health care provider may also remove a small piece of tissue (biopsy) from any abnormal tissue found. This tissue sample can then be sent to a lab so it can be looked at under a microscope. TREATMENT  Treatment will vary and may include:   Medicines to relieve pain. Nonsteroidal anti-inflammatory drugs (NSAIDs) are a type of  pain medicine that can help to relieve the pain caused by endometriosis.  Hormonal therapy. When using hormonal therapy, periods are eliminated. This eliminates the monthly exposure to blood by the displaced endometrial tissue.   Surgery. Surgery may sometimes be done to remove the abnormal endometrial tissue. In severe cases, surgery may be done to remove the fallopian tubes, uterus, and ovaries (hysterectomy). HOME CARE INSTRUCTIONS   Take all medicines as directed by your health care provider. Do not take aspirin because it may increase bleeding when you are not on hormonal therapy.   Avoid activities that produce pain, including sexual activity. SEEK MEDICAL CARE IF:  You have pelvic pain before, after, or during your periods.  You have pelvic pain between periods that gets worse during your period.  You have pelvic pain during or after sex.  You have pelvic pain with bowel movements or urination, especially during your period.  You have problems getting pregnant.  You have a fever. SEEK IMMEDIATE MEDICAL CARE IF:   Your pain is severe and is not responding to pain medicine.   You have severe nausea and vomiting, or you cannot keep foods down.   You have pain that is limited to the right lower part of your abdomen.   You have swelling or increasing pain in your abdomen.   You see blood in your stool.  MAKE SURE YOU:   Understand these instructions.  Will watch your condition.  Will get help right away if you are not doing well or get worse. Document Released: 08/25/2000 Document  Revised: 01/12/2014 Document Reviewed: 04/25/2013 Exit[employer] Patient Information 2015 [employer]. This information is not intended to replace advice given to you by your health care provider. Make sure you discuss any questions you have with your health care provider.  

## 2014-06-16 LAB — GC/CHLAMYDIA PROBE AMP
CT Probe RNA: NEGATIVE
GC Probe RNA: NEGATIVE

## 2014-06-16 LAB — POCT WET PREP (WET MOUNT)
Clue Cells Wet Prep Whiff POC: POSITIVE
KOH WET PREP POC: NEGATIVE

## 2014-06-16 LAB — PAP IG AND HPV HIGH-RISK: HPV DNA High Risk: NOT DETECTED

## 2014-06-16 LAB — RPR

## 2014-06-16 LAB — HIV ANTIBODY (ROUTINE TESTING W REFLEX): HIV 1&2 Ab, 4th Generation: NONREACTIVE

## 2014-06-22 ENCOUNTER — Telehealth: Payer: Self-pay | Admitting: *Deleted

## 2014-06-22 NOTE — Telephone Encounter (Signed)
Patient called regarding her test results- notified normal.

## 2014-06-30 ENCOUNTER — Telehealth: Payer: Self-pay | Admitting: *Deleted

## 2014-06-30 NOTE — Telephone Encounter (Addendum)
Attempted to schedule patient for a Mirena IUD insertion. Before scheduling the procedure, Wendy [name] would like to know why [name] has suggested the Mirena IUD.

## 2014-07-05 NOTE — Telephone Encounter (Signed)
She doesn't have to have it placed if she has chosen not to proceed

## 2014-07-10 NOTE — Telephone Encounter (Signed)
Left message on voicemail for patient to call back. 

## 2014-08-07 NOTE — Telephone Encounter (Signed)
Patient sates she is not sure she wants the IUD. Patient advised to contact office when she is ready to schedule.

## 2014-09-07 ENCOUNTER — Encounter: Payer: Self-pay | Admitting: *Deleted

## 2014-09-08 ENCOUNTER — Encounter: Payer: Self-pay | Admitting: Obstetrics & Gynecology

## 2014-09-30 ENCOUNTER — Encounter: Payer: Self-pay | Admitting: Gastroenterology

## 2014-11-12 DIAGNOSIS — M542 Cervicalgia: Secondary | ICD-10-CM | POA: Diagnosis not present

## 2014-11-12 DIAGNOSIS — M5412 Radiculopathy, cervical region: Secondary | ICD-10-CM | POA: Diagnosis not present

## 2014-11-12 DIAGNOSIS — Z6838 Body mass index (BMI) 38.0-38.9, adult: Secondary | ICD-10-CM | POA: Diagnosis not present

## 2014-11-12 DIAGNOSIS — M545 Low back pain: Secondary | ICD-10-CM | POA: Diagnosis not present

## 2014-11-17 ENCOUNTER — Ambulatory Visit: Payer: Medicaid Other | Admitting: Certified Nurse Midwife

## 2014-11-27 ENCOUNTER — Encounter: Payer: Self-pay | Admitting: Certified Nurse Midwife

## 2014-11-27 ENCOUNTER — Telehealth: Payer: Self-pay

## 2014-11-27 ENCOUNTER — Ambulatory Visit (INDEPENDENT_AMBULATORY_CARE_PROVIDER_SITE_OTHER): Payer: Medicare Other | Admitting: Certified Nurse Midwife

## 2014-11-27 VITALS — BP 134/80 | HR 73 | Wt 189.0 lb

## 2014-11-27 DIAGNOSIS — A499 Bacterial infection, unspecified: Secondary | ICD-10-CM

## 2014-11-27 DIAGNOSIS — B9689 Other specified bacterial agents as the cause of diseases classified elsewhere: Secondary | ICD-10-CM

## 2014-11-27 DIAGNOSIS — N760 Acute vaginitis: Secondary | ICD-10-CM

## 2014-11-27 MED ORDER — METRONIDAZOLE 500 MG PO TABS: 500.0000 mg | ORAL_TABLET | Freq: Two times a day (BID) | ORAL | Status: DC

## 2014-11-27 NOTE — Progress Notes (Signed)
Patient ID: Wendy [name], female   DOB: 04-25-1966, 49 y.o.   MRN: [id number]   Chief Complaint  Patient presents with  . Pelvic Pain    HPI Wendy [name] is a 49 y.o. female.  Here for a problem visit related to hx of endometriosis, states pain is getting worse with each menses.  Every month cycles that are light to moderate in flow, lasts around five days with severe dysmenorrhea.  Also, has for several months a strong fishy odor to her discharge and would like STD testing.  Has a stable partner for the last 4-5 years, pain is getting worse with sexual intercourse.     HPI  Past Medical History  Diagnosis Date  . Cervical disc disease     decreased range of motion  . Endometriosis     Lupron injection Q 3 mos.  Marland Kitchen PONV (postoperative nausea and vomiting)   . Fluid retention in legs   . Asthma     daily and prn inhalers  . Injury, brachial plexus     left  . Depression   . Anxiety   . Multiple allergies     takes allergy shots  . Wears partial dentures     upper partial  . 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 Deformity, hand     left  . Chronic pain     shoulders, neck, lower back    Past Surgical History  Procedure Laterality Date  . Rib resection      left - cervical rib removal  . Shoulder arthroscopy w/ rotator cuff repair  07/2011    right  . Ovarian cyst removal  2000  . Shoulder surgery      left  . Multiple tooth extractions      upper teeth and wisdom teeth  . Tooth extraction      x 1  . Diagnostic laparoscopy  10/02/2008    peritoneal bx.  . Ankle reconstruction  02/01/2012    Procedure: RECONSTRUCTION ANKLE;  Surgeon: Wylene Simmer, MD;  Location: [place];  Service: Orthopedics;  Laterality: Right;  Excision of right calcaneonavicular coalition with autologus fat graft interposition    No family history on file.  Social History History  Substance Use Topics  . Smoking status: Current Every Day  Smoker -- 0.50 packs/day for 20 years    Types: Cigarettes  . Smokeless tobacco: Never Used  . Alcohol Use: No    Allergies  Allergen Reactions  . Chocolate Hives  . Latex Hives, Itching and Swelling  . Fish-Derived Products Nausea And Vomiting  . Peanut-Containing Drug Products Hives  . Soap Rash    Current Outpatient Prescriptions  Medication Sig Dispense Refill  . albuterol (PROVENTIL HFA;VENTOLIN HFA) 108 (90 BASE) MCG/ACT inhaler Inhale 2 puffs into the lungs every 6 (six) hours as needed for wheezing or shortness of breath.     . beclomethasone (QVAR) 80 MCG/ACT inhaler Inhale 1 puff into the lungs 2 (two) times daily.     . cetirizine (ZYRTEC) 10 MG tablet Take 10 mg by mouth daily.    . clonazePAM (KLONOPIN) 0.5 MG tablet Take 0.5 mg by mouth 2 (two) times daily.    . cyclobenzaprine (FLEXERIL) 10 MG tablet Take 10 mg by mouth 2 (two) times daily as needed. For pain    . EPINEPHrine (EPIPEN JR) 0.15 MG/0.3ML injection Inject 0.15 mg into the muscle daily as needed for anaphylaxis.     Marland Kitchen  ergocalciferol (VITAMIN D2) 50000 UNITS capsule Take 50,000 Units by mouth once a week. Sundays    . escitalopram (LEXAPRO) 20 MG tablet Take 20 mg by mouth daily.     Marland Kitchen esomeprazole (NEXIUM) 20 MG capsule Take 20 mg by mouth daily at 12 noon.    Marland Kitchen estradiol (ESTRACE) 1 MG tablet Take 1 mg by mouth daily.    . furosemide (LASIX) 20 MG tablet Take 10 mg by mouth as needed. For swelling    . gabapentin (NEURONTIN) 400 MG capsule Take 400 mg by mouth 3 (three) times daily.     Marland Kitchen HYDROcodone-acetaminophen (NORCO/VICODIN) 5-325 MG per tablet Take 1 tablet by mouth every 6 (six) hours as needed for pain. 10 tablet 0  . hydrocortisone 2.5 % lotion Apply topically 2 (two) times daily. 59 mL 0  . nortriptyline (PAMELOR) 50 MG capsule Take 50 mg by mouth at bedtime.    Marland Kitchen QUEtiapine (SEROQUEL) 100 MG tablet Take 100 mg by mouth at bedtime.     . rosuvastatin (CRESTOR) 10 MG tablet Take 10 mg by mouth  daily.    Marland Kitchen tiZANidine (ZANAFLEX) 4 MG tablet Take 4 mg by mouth every 6 (six) hours as needed for muscle spasms.    Marland Kitchen triamcinolone cream (KENALOG) 0.1 % Apply 1 application topically 2 (two) times daily as needed. For rash    . docusate sodium (COLACE) 100 MG capsule Take 100 mg by mouth 2 (two) times daily.    Marland Kitchen estrogens, conjugated, (PREMARIN) 0.625 MG tablet Take 0.625 mg by mouth daily. Take daily for 21 days then do not take for 7 days.    . metroNIDAZOLE (FLAGYL) 500 MG tablet Take 1 tablet (500 mg total) by mouth 2 (two) times daily. For 7 days 14 tablet 2  . nystatin cream (MYCOSTATIN) Apply 1 application topically 2 (two) times daily as needed for dry skin.    . potassium chloride SA (K-DUR,KLOR-CON) 20 MEQ tablet Take 20 mEq by mouth 2 (two) times daily as needed (when taking lasix).      No current facility-administered medications for this visit.    Review of Systems Review of Systems Constitutional: negative for fatigue and weight loss Respiratory: negative for cough and wheezing Cardiovascular: negative for chest pain, fatigue and palpitations Gastrointestinal: + for epigastric abdominal pain, denies GERD symptoms and denies any change in bowel habits Genitourinary:negative Integument/breast: negative for nipple discharge Musculoskeletal: + for myalgias r/t hx of arthritis Neurological: negative for gait problems and tremors Behavioral/Psych: negative for abusive relationship, is treated for depression Endocrine: negative for temperature intolerance     Blood pressure 134/80, pulse 73, weight 85.73 kg (189 lb), last menstrual period 11/22/2014.  Physical Exam Physical Exam General:   alert  Skin:   no rash or abnormalities  Lungs:   clear to auscultation bilaterally  Heart:   regular rate and rhythm, S1, S2 normal, no murmur, click, rub or gallop  Breasts:   normal without suspicious masses, skin or nipple changes or axillary nodes  Abdomen:  normal findings: no  organomegaly, soft, non-tender and no hernia obese  Pelvis:  External genitalia: normal general appearance Urinary system: urethral meatus normal and bladder without fullness, nontender Vaginal: no induration or masses, tender to palpation Cervix: deferred Adnexa: normal bimanual exam Uterus: anteverted, normal size but difficult to assess d/t body habitus    75% of 15 min visit spent on counseling and coordination of care.   Data Reviewed Previous medical hx, labs, medications  Assessment     Endometriosis Dysmenorrhea  Dysperunia    Plan    Orders Placed This Encounter  Procedures  . SureSwab, Vaginosis/Vaginitis Plus   Meds ordered this encounter  Medications  . nortriptyline (PAMELOR) 50 MG capsule    Sig: Take 50 mg by mouth at bedtime.  Marland Kitchen esomeprazole (NEXIUM) 20 MG capsule    Sig: Take 20 mg by mouth daily at 12 noon.  . metroNIDAZOLE (FLAGYL) 500 MG tablet    Sig: Take 1 tablet (500 mg total) by mouth 2 (two) times daily. For 7 days    Dispense:  14 tablet    Refill:  2   Discussed various management options including Lupron Depo Injections & Mirena IUD insertion.  Patient agrees with trying the Mirena IUD for regulation of cycles & amenorrhea.

## 2014-11-27 NOTE — Telephone Encounter (Signed)
Sch patient with [facility] 12/15/14 at 11am

## 2014-12-01 LAB — SURESWAB, VAGINOSIS/VAGINITIS PLUS
Atopobium vaginae: 6.9 Log (cells/mL)
C. PARAPSILOSIS, DNA: NOT DETECTED
C. TRACHOMATIS RNA, TMA: NOT DETECTED
C. TROPICALIS, DNA: NOT DETECTED
C. albicans, DNA: NOT DETECTED
C. glabrata, DNA: NOT DETECTED
Gardnerella vaginalis: >8 Log (cells/mL)
LACTOBACILLUS SPECIES: NOT DETECTED Log (cells/mL)
MEGASPHAERA SPECIES: 4.7 Log (cells/mL)
N. gonorrhoeae RNA, TMA: NOT DETECTED
T. vaginalis RNA, QL TMA: NOT DETECTED

## 2014-12-08 ENCOUNTER — Telehealth: Payer: Self-pay

## 2014-12-08 ENCOUNTER — Other Ambulatory Visit: Payer: Self-pay | Admitting: Certified Nurse Midwife

## 2014-12-08 DIAGNOSIS — N809 Endometriosis, unspecified: Secondary | ICD-10-CM

## 2014-12-08 NOTE — Telephone Encounter (Signed)
patient has appt with Dr. Maryland Pink on 01/06/15 at 11am - they will send new patient packet - patient will go to [facility] for appt - she is aware of appt date and time

## 2014-12-17 DIAGNOSIS — N3281 Overactive bladder: Secondary | ICD-10-CM | POA: Diagnosis not present

## 2014-12-17 DIAGNOSIS — R7309 Other abnormal glucose: Secondary | ICD-10-CM | POA: Diagnosis not present

## 2014-12-17 DIAGNOSIS — E785 Hyperlipidemia, unspecified: Secondary | ICD-10-CM | POA: Diagnosis not present

## 2014-12-17 DIAGNOSIS — I10 Essential (primary) hypertension: Secondary | ICD-10-CM | POA: Diagnosis not present

## 2014-12-17 DIAGNOSIS — E780 Pure hypercholesterolemia: Secondary | ICD-10-CM | POA: Diagnosis not present

## 2015-01-20 DIAGNOSIS — E785 Hyperlipidemia, unspecified: Secondary | ICD-10-CM | POA: Diagnosis not present

## 2015-01-20 DIAGNOSIS — I10 Essential (primary) hypertension: Secondary | ICD-10-CM | POA: Diagnosis not present

## 2015-01-20 DIAGNOSIS — Z0000 Encounter for general adult medical examination without abnormal findings: Secondary | ICD-10-CM | POA: Diagnosis not present

## 2015-02-25 DIAGNOSIS — M545 Low back pain: Secondary | ICD-10-CM | POA: Diagnosis not present

## 2015-02-25 DIAGNOSIS — M542 Cervicalgia: Secondary | ICD-10-CM | POA: Diagnosis not present

## 2015-02-25 DIAGNOSIS — M5412 Radiculopathy, cervical region: Secondary | ICD-10-CM | POA: Diagnosis not present

## 2015-03-01 DIAGNOSIS — M5412 Radiculopathy, cervical region: Secondary | ICD-10-CM | POA: Diagnosis not present

## 2015-03-01 DIAGNOSIS — M542 Cervicalgia: Secondary | ICD-10-CM | POA: Diagnosis not present

## 2015-03-29 DIAGNOSIS — M545 Low back pain: Secondary | ICD-10-CM | POA: Diagnosis not present

## 2015-03-29 DIAGNOSIS — M5412 Radiculopathy, cervical region: Secondary | ICD-10-CM | POA: Diagnosis not present

## 2015-03-29 DIAGNOSIS — M542 Cervicalgia: Secondary | ICD-10-CM | POA: Diagnosis not present

## 2015-03-29 DIAGNOSIS — Z6838 Body mass index (BMI) 38.0-38.9, adult: Secondary | ICD-10-CM | POA: Diagnosis not present

## 2015-05-18 DIAGNOSIS — M545 Low back pain: Secondary | ICD-10-CM | POA: Diagnosis not present

## 2015-05-19 ENCOUNTER — Other Ambulatory Visit (HOSPITAL_COMMUNITY): Payer: Self-pay | Admitting: Family Medicine

## 2015-05-19 DIAGNOSIS — Z1231 Encounter for screening mammogram for malignant neoplasm of breast: Secondary | ICD-10-CM

## 2015-05-19 DIAGNOSIS — N809 Endometriosis, unspecified: Secondary | ICD-10-CM | POA: Diagnosis not present

## 2015-05-19 DIAGNOSIS — B9689 Other specified bacterial agents as the cause of diseases classified elsewhere: Secondary | ICD-10-CM | POA: Diagnosis not present

## 2015-05-19 DIAGNOSIS — N760 Acute vaginitis: Secondary | ICD-10-CM | POA: Diagnosis not present

## 2015-05-19 DIAGNOSIS — N3281 Overactive bladder: Secondary | ICD-10-CM | POA: Diagnosis not present

## 2015-05-19 DIAGNOSIS — N946 Dysmenorrhea, unspecified: Secondary | ICD-10-CM | POA: Diagnosis not present

## 2015-05-21 ENCOUNTER — Ambulatory Visit (HOSPITAL_COMMUNITY)
Admission: RE | Admit: 2015-05-21 | Discharge: 2015-05-21 | Disposition: A | Discharge status: Home / Self Care | Payer: Medicare Other | Source: Ambulatory Visit | Attending: Family Medicine | Admitting: Family Medicine

## 2015-05-21 DIAGNOSIS — Z1231 Encounter for screening mammogram for malignant neoplasm of breast: Secondary | ICD-10-CM

## 2015-05-25 DIAGNOSIS — M62830 Muscle spasm of back: Secondary | ICD-10-CM | POA: Diagnosis not present

## 2015-05-25 DIAGNOSIS — I10 Essential (primary) hypertension: Secondary | ICD-10-CM | POA: Diagnosis not present

## 2015-05-25 DIAGNOSIS — E785 Hyperlipidemia, unspecified: Secondary | ICD-10-CM | POA: Diagnosis not present

## 2015-05-25 DIAGNOSIS — R7309 Other abnormal glucose: Secondary | ICD-10-CM | POA: Diagnosis not present

## 2015-05-25 DIAGNOSIS — N3281 Overactive bladder: Secondary | ICD-10-CM | POA: Diagnosis not present

## 2015-06-15 DIAGNOSIS — M545 Low back pain: Secondary | ICD-10-CM | POA: Diagnosis not present

## 2015-06-15 DIAGNOSIS — M5412 Radiculopathy, cervical region: Secondary | ICD-10-CM | POA: Diagnosis not present

## 2015-06-15 DIAGNOSIS — M542 Cervicalgia: Secondary | ICD-10-CM | POA: Diagnosis not present

## 2015-06-30 DIAGNOSIS — N946 Dysmenorrhea, unspecified: Secondary | ICD-10-CM | POA: Diagnosis not present

## 2015-06-30 DIAGNOSIS — Z09 Encounter for follow-up examination after completed treatment for conditions other than malignant neoplasm: Secondary | ICD-10-CM | POA: Diagnosis not present

## 2015-06-30 DIAGNOSIS — N3281 Overactive bladder: Secondary | ICD-10-CM | POA: Diagnosis not present

## 2015-07-16 ENCOUNTER — Ambulatory Visit (INDEPENDENT_AMBULATORY_CARE_PROVIDER_SITE_OTHER): Payer: Medicare Other | Admitting: Obstetrics

## 2015-07-16 VITALS — BP 106/71 | HR 67 | Temp 98.2°F | Wt 191.0 lb

## 2015-07-16 DIAGNOSIS — N939 Abnormal uterine and vaginal bleeding, unspecified: Secondary | ICD-10-CM | POA: Diagnosis not present

## 2015-07-16 DIAGNOSIS — N809 Endometriosis, unspecified: Secondary | ICD-10-CM

## 2015-07-16 DIAGNOSIS — Z01818 Encounter for other preprocedural examination: Secondary | ICD-10-CM | POA: Diagnosis not present

## 2015-07-16 DIAGNOSIS — Z113 Encounter for screening for infections with a predominantly sexual mode of transmission: Secondary | ICD-10-CM | POA: Diagnosis not present

## 2015-07-16 DIAGNOSIS — N766 Ulceration of vulva: Secondary | ICD-10-CM | POA: Diagnosis not present

## 2015-07-16 DIAGNOSIS — N946 Dysmenorrhea, unspecified: Secondary | ICD-10-CM | POA: Diagnosis not present

## 2015-07-16 DIAGNOSIS — N760 Acute vaginitis: Secondary | ICD-10-CM | POA: Diagnosis not present

## 2015-07-16 NOTE — Progress Notes (Signed)
Patient ID: IZA PRESTON, female   DOB: February 22, 1966, 49 y.o.   MRN: [id number]  Chief Complaint  Patient presents with  . Contraception    Mirena Insertion-on cycle now, pt last intercourse 2-3 days ago, pt noticed labial bumps    HPI Wendy [name] is a 49 y.o. female.  Presents for Mirena IUD insertion.  She is nulligravid with history of endometriosis diagnosed by Laparoscopy. HPI  Past Medical History  Diagnosis Date  . Cervical disc disease     decreased range of motion  . Endometriosis     Lupron injection Q 3 mos.  Marland Kitchen PONV (postoperative nausea and vomiting)   . Fluid retention in legs   . Asthma     daily and prn inhalers  . Injury, brachial plexus     left  . Depression   . Anxiety   . Multiple allergies     takes allergy shots  . Wears partial dentures     upper partial  . 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 Deformity, hand     left  . Chronic pain     shoulders, neck, lower back    Past Surgical History  Procedure Laterality Date  . Rib resection      left - cervical rib removal  . Shoulder arthroscopy w/ rotator cuff repair  07/2011    right  . Ovarian cyst removal  2000  . Shoulder surgery      left  . Multiple tooth extractions      upper teeth and wisdom teeth  . Tooth extraction      x 1  . Diagnostic laparoscopy  10/02/2008    peritoneal bx.  . Ankle reconstruction  02/01/2012    Procedure: RECONSTRUCTION ANKLE;  Surgeon: Wylene Simmer, MD;  Location: [facility];  Service: Orthopedics;  Laterality: Right;  Excision of right calcaneonavicular coalition with autologus fat graft interposition    No family history on file.  Social History Social History  Substance Use Topics  . Smoking status: Current Every Day Smoker -- 0.50 packs/day for 20 years    Types: Cigarettes  . Smokeless tobacco: Never Used  . Alcohol Use: No    Allergies  Allergen Reactions  . Chocolate Hives  . Latex Hives,  Itching and Swelling  . Shellfish Allergy   . Fish-Derived Products Nausea And Vomiting  . Peanut-Containing Drug Products Hives  . Soap Rash    Current Outpatient Prescriptions  Medication Sig Dispense Refill  . albuterol (PROVENTIL HFA;VENTOLIN HFA) 108 (90 BASE) MCG/ACT inhaler Inhale 2 puffs into the lungs every 6 (six) hours as needed for wheezing or shortness of breath.     . beclomethasone (QVAR) 80 MCG/ACT inhaler Inhale 1 puff into the lungs 2 (two) times daily.     . cetirizine (ZYRTEC) 10 MG tablet Take 10 mg by mouth daily.    . clonazePAM (KLONOPIN) 0.5 MG tablet Take 0.5 mg by mouth 2 (two) times daily.    . cyclobenzaprine (FLEXERIL) 10 MG tablet Take 10 mg by mouth 2 (two) times daily as needed. For pain    . EPINEPHrine (EPIPEN JR) 0.15 MG/0.3ML injection Inject 0.15 mg into the muscle daily as needed for anaphylaxis.     Marland Kitchen ergocalciferol (VITAMIN D2) 50000 UNITS capsule Take 50,000 Units by mouth once a week. Sundays    . esomeprazole (NEXIUM) 20 MG capsule Take 20 mg by mouth daily at 12 noon.    Marland Kitchen  estradiol (ESTRACE) 1 MG tablet Take 1 mg by mouth daily.    Marland Kitchen estrogens, conjugated, (PREMARIN) 0.625 MG tablet Take 0.625 mg by mouth daily. Take daily for 21 days then do not take for 7 days.    . furosemide (LASIX) 20 MG tablet Take 10 mg by mouth as needed. For swelling    . gabapentin (NEURONTIN) 400 MG capsule Take 400 mg by mouth 3 (three) times daily.     Marland Kitchen HYDROcodone-acetaminophen (NORCO/VICODIN) 5-325 MG per tablet Take 1 tablet by mouth every 6 (six) hours as needed for pain. 10 tablet 0  . lurasidone (LATUDA) 40 MG TABS tablet Take 40 mg by mouth daily with breakfast.    . nortriptyline (PAMELOR) 50 MG capsule Take 50 mg by mouth at bedtime.    . potassium chloride SA (K-DUR,KLOR-CON) 20 MEQ tablet Take 20 mEq by mouth 2 (two) times daily as needed (when taking lasix).     . QUEtiapine (SEROQUEL) 100 MG tablet Take 100 mg by mouth at bedtime.     . rosuvastatin  (CRESTOR) 10 MG tablet Take 10 mg by mouth daily.    Marland Kitchen tiZANidine (ZANAFLEX) 4 MG tablet Take 4 mg by mouth every 6 (six) hours as needed for muscle spasms.    Marland Kitchen docusate sodium (COLACE) 100 MG capsule Take 100 mg by mouth 2 (two) times daily.    Marland Kitchen escitalopram (LEXAPRO) 20 MG tablet Take 20 mg by mouth daily.     . hydrocortisone 2.5 % lotion Apply topically 2 (two) times daily. 59 mL 0  . nystatin cream (MYCOSTATIN) Apply 1 application topically 2 (two) times daily as needed for dry skin.    Marland Kitchen triamcinolone cream (KENALOG) 0.1 % Apply 1 application topically 2 (two) times daily as needed. For rash     No current facility-administered medications for this visit.    Review of Systems Review of Systems Constitutional: negative for fatigue and weight loss Respiratory: negative for cough and wheezing Cardiovascular: negative for chest pain, fatigue and palpitations Gastrointestinal: negative for abdominal pain and change in bowel habits Genitourinary: positive for tender vulva ulcer.  Painful, heavy periods Integument/breast: negative for nipple discharge Musculoskeletal:negative for myalgias Neurological: negative for gait problems and tremors Behavioral/Psych: negative for abusive relationship, depression Endocrine: negative for temperature intolerance     Blood pressure 106/71, pulse 67, temperature 98.2 F (36.8 C), weight 191 lb (86.637 kg), last menstrual period 07/16/2015.  Physical Exam Physical Exam           General: Alert and no distress Abdomen:  normal findings: no organomegaly, soft, non-tender and no hernia  Pelvis:  External genitalia: normal general appearance Urinary system: urethral meatus normal and bladder without fullness, nontender Vaginal: normal without tenderness, induration or masses Cervix: stenosed.  Difficult to pass IUD and uterine fundus did not accommodate IUD.  Adnexa: normal bimanual exam Uterus: anteverted and non-tender, normal size.  Sounded to 6  cm.      Data Reviewed Previous notes Previous ultrasound  Assessment     Endometriosis Failed attempt to insert IUD.  R/O Genital Herpes    Plan    Patient requested Lupron for Endometriosis. Herpes culture sent from vulva ulceration. F/U in 2 weeks  Orders Placed This Encounter  Procedures  . SureSwab, Vaginosis/Vaginitis Plus  . Herpes simplex virus culture    Ulceration at left vulva-perineal junction  . POCT urine pregnancy   Meds ordered this encounter  Medications  . lurasidone (LATUDA) 40 MG TABS tablet  Sig: Take 40 mg by mouth daily with breakfast.

## 2015-07-19 LAB — HERPES SIMPLEX VIRUS CULTURE: Organism ID, Bacteria: DETECTED

## 2015-07-21 DIAGNOSIS — L509 Urticaria, unspecified: Secondary | ICD-10-CM | POA: Diagnosis not present

## 2015-07-21 DIAGNOSIS — J4530 Mild persistent asthma, uncomplicated: Secondary | ICD-10-CM | POA: Diagnosis not present

## 2015-07-21 DIAGNOSIS — J3089 Other allergic rhinitis: Secondary | ICD-10-CM | POA: Diagnosis not present

## 2015-07-21 DIAGNOSIS — J301 Allergic rhinitis due to pollen: Secondary | ICD-10-CM | POA: Diagnosis not present

## 2015-07-22 LAB — SURESWAB, VAGINOSIS/VAGINITIS PLUS
Atopobium vaginae: 7 Log (cells/mL)
C. TROPICALIS, DNA: NOT DETECTED
C. albicans, DNA: NOT DETECTED
C. glabrata, DNA: NOT DETECTED
C. parapsilosis, DNA: NOT DETECTED
C. trachomatis RNA, TMA: NOT DETECTED
LACTOBACILLUS SPECIES: NOT DETECTED Log (cells/mL)
MEGASPHAERA SPECIES: 7.4 Log (cells/mL)
N. gonorrhoeae RNA, TMA: NOT DETECTED
T. vaginalis RNA, QL TMA: NOT DETECTED

## 2015-08-26 ENCOUNTER — Ambulatory Visit (INDEPENDENT_AMBULATORY_CARE_PROVIDER_SITE_OTHER): Payer: Medicare Other | Admitting: Obstetrics

## 2015-08-26 ENCOUNTER — Encounter: Payer: Self-pay | Admitting: Obstetrics

## 2015-08-26 VITALS — BP 140/79 | HR 70 | Ht 59.0 in | Wt 188.0 lb

## 2015-08-26 DIAGNOSIS — Z01419 Encounter for gynecological examination (general) (routine) without abnormal findings: Secondary | ICD-10-CM | POA: Diagnosis not present

## 2015-08-26 DIAGNOSIS — D259 Leiomyoma of uterus, unspecified: Secondary | ICD-10-CM | POA: Diagnosis not present

## 2015-08-26 DIAGNOSIS — A6000 Herpesviral infection of urogenital system, unspecified: Secondary | ICD-10-CM

## 2015-08-26 DIAGNOSIS — N760 Acute vaginitis: Secondary | ICD-10-CM | POA: Diagnosis not present

## 2015-08-26 DIAGNOSIS — Z124 Encounter for screening for malignant neoplasm of cervix: Secondary | ICD-10-CM | POA: Diagnosis not present

## 2015-08-26 DIAGNOSIS — Z113 Encounter for screening for infections with a predominantly sexual mode of transmission: Secondary | ICD-10-CM | POA: Diagnosis not present

## 2015-08-26 DIAGNOSIS — B9689 Other specified bacterial agents as the cause of diseases classified elsewhere: Secondary | ICD-10-CM

## 2015-08-26 MED ORDER — VALACYCLOVIR HCL 1 G PO TABS: ORAL_TABLET | ORAL | Status: DC

## 2015-08-26 MED ORDER — METRONIDAZOLE 500 MG PO TABS: 500.0000 mg | ORAL_TABLET | Freq: Two times a day (BID) | ORAL | Status: DC

## 2015-08-26 NOTE — Progress Notes (Signed)
Subjective:        Wendy [name] is a 49 y.o. female here for a routine exam.  Current complaints: None.    Personal health questionnaire:  Is [name], have a family history of breast and/or ovarian cancer: no Is there a family history of uterine cancer diagnosed at age < 55, gastrointestinal cancer, urinary tract cancer, family member who is a Field seismologist syndrome-associated carrier: no Is the patient overweight and hypertensive, family history of diabetes, personal history of gestational diabetes, preeclampsia or PCOS: no Is patient over 62, have PCOS,  family history of premature CHD under age 68, diabetes, smoke, have hypertension or peripheral artery disease:  no At any time, has a partner hit, kicked or otherwise hurt or frightened you?: no Over the past 2 weeks, have you felt down, depressed or hopeless?: no Over the past 2 weeks, have you felt little interest or pleasure in doing things?:no   Gynecologic History Patient's last menstrual period was 07/26/2015. Contraception: none Last Pap: 2015. Results were: normal Last mammogram: 2016.  Results were: normal  Obstetric History OB History  Gravida Para Term Preterm AB SAB TAB Ectopic Multiple Living  0 0 0 0 0 0 0 0 0 0         Past Medical History  Diagnosis Date  . Cervical disc disease     decreased range of motion  . Endometriosis     Lupron injection Q 3 mos.  Marland Kitchen PONV (postoperative nausea and vomiting)   . Fluid retention in legs   . Asthma     daily and prn inhalers  . Injury, brachial plexus     left  . Depression   . Anxiety   . Multiple allergies     takes allergy shots  . Wears partial dentures     upper partial  . 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 Deformity, hand     left  . Chronic pain     shoulders, neck, lower back    Past Surgical History  Procedure Laterality Date  . Rib resection      left - cervical rib removal  . Shoulder arthroscopy  w/ rotator cuff repair  07/2011    right  . Ovarian cyst removal  2000  . Shoulder surgery      left  . Multiple tooth extractions      upper teeth and wisdom teeth  . Tooth extraction      x 1  . Diagnostic laparoscopy  10/02/2008    peritoneal bx.  . Ankle reconstruction  02/01/2012    Procedure: RECONSTRUCTION ANKLE;  Surgeon: Wylene Simmer, MD;  Location: [facility];  Service: Orthopedics;  Laterality: Right;  Excision of right calcaneonavicular coalition with autologus fat graft interposition     Current outpatient prescriptions:  .  albuterol (PROVENTIL HFA;VENTOLIN HFA) 108 (90 BASE) MCG/ACT inhaler, Inhale 2 puffs into the lungs every 6 (six) hours as needed for wheezing or shortness of breath. , Disp: , Rfl:  .  beclomethasone (QVAR) 80 MCG/ACT inhaler, Inhale 1 puff into the lungs 2 (two) times daily. , Disp: , Rfl:  .  cetirizine (ZYRTEC) 10 MG tablet, Take 10 mg by mouth daily., Disp: , Rfl:  .  clonazePAM (KLONOPIN) 0.5 MG tablet, Take 0.5 mg by mouth 2 (two) times daily., Disp: , Rfl:  .  cyclobenzaprine (FLEXERIL) 10 MG tablet, Take 10 mg by mouth 2 (two) times daily as  needed. For pain, Disp: , Rfl:  .  docusate sodium (COLACE) 100 MG capsule, Take 100 mg by mouth 2 (two) times daily., Disp: , Rfl:  .  EPINEPHrine (EPIPEN JR) 0.15 MG/0.3ML injection, Inject 0.15 mg into the muscle daily as needed for anaphylaxis. , Disp: , Rfl:  .  ergocalciferol (VITAMIN D2) 50000 UNITS capsule, Take 50,000 Units by mouth once a week. Sundays, Disp: , Rfl:  .  escitalopram (LEXAPRO) 20 MG tablet, Take 20 mg by mouth daily. , Disp: , Rfl:  .  esomeprazole (NEXIUM) 20 MG capsule, Take 20 mg by mouth daily at 12 noon., Disp: , Rfl:  .  estradiol (ESTRACE) 1 MG tablet, Take 1 mg by mouth daily., Disp: , Rfl:  .  estrogens, conjugated, (PREMARIN) 0.625 MG tablet, Take 0.625 mg by mouth daily. Take daily for 21 days then do not take for 7 days., Disp: , Rfl:  .  furosemide (LASIX) 20  MG tablet, Take 10 mg by mouth as needed. For swelling, Disp: , Rfl:  .  gabapentin (NEURONTIN) 400 MG capsule, Take 400 mg by mouth 3 (three) times daily. , Disp: , Rfl:  .  HYDROcodone-acetaminophen (NORCO/VICODIN) 5-325 MG per tablet, Take 1 tablet by mouth every 6 (six) hours as needed for pain., Disp: 10 tablet, Rfl: 0 .  hydrocortisone 2.5 % lotion, Apply topically 2 (two) times daily., Disp: 59 mL, Rfl: 0 .  lurasidone (LATUDA) 40 MG TABS tablet, Take 40 mg by mouth daily with breakfast., Disp: , Rfl:  .  nortriptyline (PAMELOR) 50 MG capsule, Take 50 mg by mouth at bedtime., Disp: , Rfl:  .  nystatin cream (MYCOSTATIN), Apply 1 application topically 2 (two) times daily as needed for dry skin., Disp: , Rfl:  .  potassium chloride SA (K-DUR,KLOR-CON) 20 MEQ tablet, Take 20 mEq by mouth 2 (two) times daily as needed (when taking lasix). , Disp: , Rfl:  .  QUEtiapine (SEROQUEL) 100 MG tablet, Take 100 mg by mouth at bedtime. , Disp: , Rfl:  .  rosuvastatin (CRESTOR) 10 MG tablet, Take 10 mg by mouth daily., Disp: , Rfl:  .  tiZANidine (ZANAFLEX) 4 MG tablet, Take 4 mg by mouth every 6 (six) hours as needed for muscle spasms., Disp: , Rfl:  .  triamcinolone cream (KENALOG) 0.1 %, Apply 1 application topically 2 (two) times daily as needed. For rash, Disp: , Rfl:  Allergies  Allergen Reactions  . Chocolate Hives  . Latex Hives, Itching and Swelling  . Shellfish Allergy   . Fish-Derived Products Nausea And Vomiting  . Peanut-Containing Drug Products Hives  . Soap Rash    Social History  Substance Use Topics  . Smoking status: Current Every Day Smoker -- 0.50 packs/day for 20 years    Types: Cigarettes  . Smokeless tobacco: Never Used  . Alcohol Use: No    History reviewed. No pertinent family history.    Review of Systems  Constitutional: negative for fatigue and weight loss Respiratory: negative for cough and wheezing Cardiovascular: negative for chest pain, fatigue and  palpitations Gastrointestinal: negative for abdominal pain and change in bowel habits Musculoskeletal:negative for myalgias Neurological: negative for gait problems and tremors Behavioral/Psych: negative for abusive relationship, depression Endocrine: negative for temperature intolerance   Genitourinary:negative for abnormal menstrual periods, genital lesions, hot flashes, sexual problems and vaginal discharge Integument/breast: negative for breast lump, breast tenderness, nipple discharge and skin lesion(s)    Objective:       BP 140/79  mmHg  Pulse 70  Ht 4\' 11"  (1.499 m)  Wt 188 lb (85.276 kg)  BMI 37.95 kg/m2  LMP 07/26/2015 General:   alert  Skin:   no rash or abnormalities  Lungs:   clear to auscultation bilaterally  Heart:   regular rate and rhythm, S1, S2 normal, no murmur, click, rub or gallop  Breasts:   normal without suspicious masses, skin or nipple changes or axillary nodes  Abdomen:  normal findings: no organomegaly, soft, non-tender and no hernia  Pelvis:  External genitalia: normal general appearance Urinary system: urethral meatus normal and bladder without fullness, nontender Vaginal: normal without tenderness, induration or masses Cervix: normal appearance Adnexa: normal bimanual exam Uterus: anteverted and non-tender, normal size   Lab Review Urine pregnancy test Labs reviewed yes Radiologic studies reviewed no    Assessment:    Healthy female exam.    BV  H/O Genital Herpes   Plan:   Flagyl Rx Valtrex Rx   Education reviewed: calcium supplements, low fat, low cholesterol diet, safe sex/STD prevention, self breast exams and weight bearing exercise. Contraception: none. Follow up in: 1 year.   No orders of the defined types were placed in this encounter.   Orders Placed This Encounter  Procedures  . SureSwab, Vaginosis/Vaginitis Plus

## 2015-08-30 LAB — PAP, TP IMAGING W/ HPV RNA, RFLX HPV TYPE 16,18/45: HPV mRNA, High Risk: NOT DETECTED

## 2015-08-30 LAB — SURESWAB, VAGINOSIS/VAGINITIS PLUS
ATOPOBIUM VAGINAE: 7.5 Log (cells/mL)
C. ALBICANS, DNA: NOT DETECTED
C. TRACHOMATIS RNA, TMA: NOT DETECTED
C. glabrata, DNA: NOT DETECTED
C. parapsilosis, DNA: NOT DETECTED
C. tropicalis, DNA: NOT DETECTED
Gardnerella vaginalis: >8 Log (cells/mL)
LACTOBACILLUS SPECIES: NOT DETECTED Log (cells/mL)
N. GONORRHOEAE RNA, TMA: NOT DETECTED
T. vaginalis RNA, QL TMA: NOT DETECTED

## 2015-08-31 ENCOUNTER — Other Ambulatory Visit: Payer: Self-pay | Admitting: Obstetrics

## 2015-09-08 ENCOUNTER — Telehealth: Payer: Self-pay | Admitting: *Deleted

## 2015-09-08 NOTE — Telephone Encounter (Signed)
Patient's medication needs prior authorization- [phone] Call for authorization- [id number]

## 2015-09-08 NOTE — Telephone Encounter (Signed)
LM on VM- Rx prior authorization on file.

## 2015-09-15 DIAGNOSIS — M545 Low back pain: Secondary | ICD-10-CM | POA: Diagnosis not present

## 2015-09-15 DIAGNOSIS — M5412 Radiculopathy, cervical region: Secondary | ICD-10-CM | POA: Diagnosis not present

## 2015-09-15 DIAGNOSIS — M542 Cervicalgia: Secondary | ICD-10-CM | POA: Diagnosis not present

## 2015-09-22 DIAGNOSIS — A084 Viral intestinal infection, unspecified: Secondary | ICD-10-CM | POA: Diagnosis not present

## 2015-09-22 DIAGNOSIS — R1110 Vomiting, unspecified: Secondary | ICD-10-CM | POA: Diagnosis not present

## 2015-09-22 DIAGNOSIS — R110 Nausea: Secondary | ICD-10-CM | POA: Diagnosis not present

## 2015-09-22 DIAGNOSIS — E785 Hyperlipidemia, unspecified: Secondary | ICD-10-CM | POA: Diagnosis not present

## 2015-09-27 DIAGNOSIS — A084 Viral intestinal infection, unspecified: Secondary | ICD-10-CM | POA: Diagnosis not present

## 2015-09-27 DIAGNOSIS — K759 Inflammatory liver disease, unspecified: Secondary | ICD-10-CM | POA: Diagnosis not present

## 2015-10-13 DIAGNOSIS — I10 Essential (primary) hypertension: Secondary | ICD-10-CM | POA: Diagnosis not present

## 2015-10-13 DIAGNOSIS — E785 Hyperlipidemia, unspecified: Secondary | ICD-10-CM | POA: Diagnosis not present

## 2015-10-13 DIAGNOSIS — A084 Viral intestinal infection, unspecified: Secondary | ICD-10-CM | POA: Diagnosis not present

## 2015-10-13 DIAGNOSIS — K759 Inflammatory liver disease, unspecified: Secondary | ICD-10-CM | POA: Diagnosis not present

## 2015-11-12 DIAGNOSIS — E785 Hyperlipidemia, unspecified: Secondary | ICD-10-CM | POA: Diagnosis not present

## 2015-11-12 DIAGNOSIS — G540 Brachial plexus disorders: Secondary | ICD-10-CM | POA: Diagnosis not present

## 2015-11-12 DIAGNOSIS — I10 Essential (primary) hypertension: Secondary | ICD-10-CM | POA: Diagnosis not present

## 2015-11-12 DIAGNOSIS — M75102 Unspecified rotator cuff tear or rupture of left shoulder, not specified as traumatic: Secondary | ICD-10-CM | POA: Diagnosis not present

## 2015-11-19 DIAGNOSIS — M67912 Unspecified disorder of synovium and tendon, left shoulder: Secondary | ICD-10-CM | POA: Diagnosis not present

## 2015-12-06 DIAGNOSIS — M5412 Radiculopathy, cervical region: Secondary | ICD-10-CM | POA: Diagnosis not present

## 2015-12-06 DIAGNOSIS — M545 Low back pain: Secondary | ICD-10-CM | POA: Diagnosis not present

## 2015-12-06 DIAGNOSIS — M542 Cervicalgia: Secondary | ICD-10-CM | POA: Diagnosis not present

## 2015-12-28 DIAGNOSIS — I10 Essential (primary) hypertension: Secondary | ICD-10-CM | POA: Diagnosis not present

## 2015-12-28 DIAGNOSIS — G540 Brachial plexus disorders: Secondary | ICD-10-CM | POA: Diagnosis not present

## 2016-02-28 DIAGNOSIS — E785 Hyperlipidemia, unspecified: Secondary | ICD-10-CM | POA: Diagnosis not present

## 2016-02-28 DIAGNOSIS — K759 Inflammatory liver disease, unspecified: Secondary | ICD-10-CM | POA: Diagnosis not present

## 2016-02-28 DIAGNOSIS — N3281 Overactive bladder: Secondary | ICD-10-CM | POA: Diagnosis not present

## 2016-02-28 DIAGNOSIS — I10 Essential (primary) hypertension: Secondary | ICD-10-CM | POA: Diagnosis not present

## 2016-03-02 DIAGNOSIS — M5412 Radiculopathy, cervical region: Secondary | ICD-10-CM | POA: Diagnosis not present

## 2016-03-02 DIAGNOSIS — M545 Low back pain: Secondary | ICD-10-CM | POA: Diagnosis not present

## 2016-03-02 DIAGNOSIS — M542 Cervicalgia: Secondary | ICD-10-CM | POA: Diagnosis not present

## 2016-05-24 DIAGNOSIS — M5412 Radiculopathy, cervical region: Secondary | ICD-10-CM | POA: Diagnosis not present

## 2016-05-24 DIAGNOSIS — R030 Elevated blood-pressure reading, without diagnosis of hypertension: Secondary | ICD-10-CM | POA: Diagnosis not present

## 2016-05-24 DIAGNOSIS — M545 Low back pain: Secondary | ICD-10-CM | POA: Diagnosis not present

## 2016-05-24 DIAGNOSIS — M542 Cervicalgia: Secondary | ICD-10-CM | POA: Diagnosis not present

## 2016-06-14 DIAGNOSIS — M79645 Pain in left finger(s): Secondary | ICD-10-CM | POA: Diagnosis not present

## 2016-06-14 DIAGNOSIS — M180 Bilateral primary osteoarthritis of first carpometacarpal joints: Secondary | ICD-10-CM | POA: Diagnosis not present

## 2016-06-14 DIAGNOSIS — M25532 Pain in left wrist: Secondary | ICD-10-CM | POA: Diagnosis not present

## 2016-07-03 DIAGNOSIS — R252 Cramp and spasm: Secondary | ICD-10-CM | POA: Diagnosis not present

## 2016-07-03 DIAGNOSIS — M940 Chondrocostal junction syndrome [Tietze]: Secondary | ICD-10-CM | POA: Diagnosis not present

## 2016-07-03 DIAGNOSIS — I10 Essential (primary) hypertension: Secondary | ICD-10-CM | POA: Diagnosis not present

## 2016-08-23 DIAGNOSIS — M545 Low back pain: Secondary | ICD-10-CM | POA: Diagnosis not present

## 2016-08-23 DIAGNOSIS — M542 Cervicalgia: Secondary | ICD-10-CM | POA: Diagnosis not present

## 2016-08-23 DIAGNOSIS — R030 Elevated blood-pressure reading, without diagnosis of hypertension: Secondary | ICD-10-CM | POA: Diagnosis not present

## 2016-08-23 DIAGNOSIS — M5412 Radiculopathy, cervical region: Secondary | ICD-10-CM | POA: Diagnosis not present

## 2016-09-20 DIAGNOSIS — Z1211 Encounter for screening for malignant neoplasm of colon: Secondary | ICD-10-CM | POA: Diagnosis not present

## 2016-09-20 DIAGNOSIS — M542 Cervicalgia: Secondary | ICD-10-CM | POA: Diagnosis not present

## 2016-09-20 DIAGNOSIS — R1310 Dysphagia, unspecified: Secondary | ICD-10-CM | POA: Diagnosis not present

## 2016-11-20 ENCOUNTER — Emergency Department (HOSPITAL_COMMUNITY): Payer: Medicare Other

## 2016-11-20 ENCOUNTER — Encounter (HOSPITAL_COMMUNITY): Payer: Self-pay | Admitting: Emergency Medicine

## 2016-11-20 ENCOUNTER — Emergency Department (HOSPITAL_COMMUNITY)
Admission: EM | Admit: 2016-11-20 | Discharge: 2016-11-20 | Disposition: A | Discharge status: Home / Self Care | Payer: Medicare Other | Attending: Emergency Medicine | Admitting: Emergency Medicine

## 2016-11-20 DIAGNOSIS — J45909 Unspecified asthma, uncomplicated: Secondary | ICD-10-CM | POA: Diagnosis not present

## 2016-11-20 DIAGNOSIS — R1011 Right upper quadrant pain: Secondary | ICD-10-CM | POA: Diagnosis not present

## 2016-11-20 DIAGNOSIS — Z91010 Allergy to peanuts: Secondary | ICD-10-CM | POA: Insufficient documentation

## 2016-11-20 DIAGNOSIS — R112 Nausea with vomiting, unspecified: Secondary | ICD-10-CM | POA: Insufficient documentation

## 2016-11-20 DIAGNOSIS — R059 Cough, unspecified: Secondary | ICD-10-CM

## 2016-11-20 DIAGNOSIS — J069 Acute upper respiratory infection, unspecified: Secondary | ICD-10-CM | POA: Diagnosis not present

## 2016-11-20 DIAGNOSIS — Z79899 Other long term (current) drug therapy: Secondary | ICD-10-CM | POA: Insufficient documentation

## 2016-11-20 DIAGNOSIS — E876 Hypokalemia: Secondary | ICD-10-CM

## 2016-11-20 DIAGNOSIS — F17210 Nicotine dependence, cigarettes, uncomplicated: Secondary | ICD-10-CM | POA: Insufficient documentation

## 2016-11-20 DIAGNOSIS — R1084 Generalized abdominal pain: Secondary | ICD-10-CM | POA: Insufficient documentation

## 2016-11-20 DIAGNOSIS — Z91040 Latex allergy status: Secondary | ICD-10-CM | POA: Diagnosis not present

## 2016-11-20 DIAGNOSIS — R05 Cough: Secondary | ICD-10-CM | POA: Diagnosis not present

## 2016-11-20 DIAGNOSIS — E860 Dehydration: Secondary | ICD-10-CM | POA: Diagnosis not present

## 2016-11-20 DIAGNOSIS — J180 Bronchopneumonia, unspecified organism: Secondary | ICD-10-CM | POA: Diagnosis not present

## 2016-11-20 DIAGNOSIS — Z720 Tobacco use: Secondary | ICD-10-CM

## 2016-11-20 DIAGNOSIS — R197 Diarrhea, unspecified: Secondary | ICD-10-CM

## 2016-11-20 LAB — URINALYSIS, ROUTINE W REFLEX MICROSCOPIC
Bilirubin Urine: NEGATIVE
Glucose, UA: NEGATIVE mg/dL
Ketones, ur: NEGATIVE mg/dL
LEUKOCYTES UA: NEGATIVE
Nitrite: NEGATIVE
PH: 6 (ref 5.0–8.0)
Protein, ur: 100 mg/dL — AB
SPECIFIC GRAVITY, URINE: 1.018 (ref 1.005–1.030)

## 2016-11-20 LAB — COMPREHENSIVE METABOLIC PANEL
ALT: 18 U/L (ref 14–54)
AST: 32 U/L (ref 15–41)
Albumin: 3.9 g/dL (ref 3.5–5.0)
Alkaline Phosphatase: 47 U/L (ref 38–126)
Anion gap: 13 (ref 5–15)
BILIRUBIN TOTAL: 1 mg/dL (ref 0.3–1.2)
BUN: 8 mg/dL (ref 6–20)
CHLORIDE: 102 mmol/L (ref 101–111)
CO2: 25 mmol/L (ref 22–32)
CREATININE: 0.88 mg/dL (ref 0.44–1.00)
Calcium: 9.2 mg/dL (ref 8.9–10.3)
GFR calc Af Amer: >60 mL/min (ref >60)
Glucose, Bld: 100 mg/dL — ABNORMAL HIGH (ref 65–99)
Potassium: 3.1 mmol/L — ABNORMAL LOW (ref 3.5–5.1)
Sodium: 140 mmol/L (ref 135–145)
Total Protein: 8 g/dL (ref 6.5–8.1)

## 2016-11-20 LAB — CBC
HCT: 37.9 % (ref 36.0–46.0)
Hemoglobin: 12.8 g/dL (ref 12.0–15.0)
MCH: 28.5 pg (ref 26.0–34.0)
MCHC: 33.8 g/dL (ref 30.0–36.0)
MCV: 84.4 fL (ref 78.0–100.0)
PLATELETS: 192 10*3/uL (ref 150–400)
RBC: 4.49 MIL/uL (ref 3.87–5.11)
RDW: 12.7 % (ref 11.5–15.5)
WBC: 4.6 10*3/uL (ref 4.0–10.5)

## 2016-11-20 LAB — LIPASE, BLOOD: LIPASE: 23 U/L (ref 11–51)

## 2016-11-20 MED ORDER — ONDANSETRON HCL 4 MG/2ML IJ SOLN
4.0000 mg | Freq: Once | INTRAMUSCULAR | Status: AC
Start: 2016-11-20 — End: 2016-11-20
  Administered 2016-11-20: 4 mg via INTRAVENOUS
  Filled 2016-11-20: qty 2

## 2016-11-20 MED ORDER — ALBUTEROL SULFATE (2.5 MG/3ML) 0.083% IN NEBU
5.0000 mg | INHALATION_SOLUTION | Freq: Once | RESPIRATORY_TRACT | Status: AC
  Administered 2016-11-20: 5 mg via RESPIRATORY_TRACT
  Filled 2016-11-20: qty 6

## 2016-11-20 MED ORDER — SODIUM CHLORIDE 0.9 % IV BOLUS (SEPSIS)
1000.0000 mL | Freq: Once | INTRAVENOUS | Status: AC
  Administered 2016-11-20: 1000 mL via INTRAVENOUS

## 2016-11-20 MED ORDER — FAMOTIDINE IN NACL 20-0.9 MG/50ML-% IV SOLN
20.0000 mg | Freq: Once | INTRAVENOUS | Status: AC
  Administered 2016-11-20: 20 mg via INTRAVENOUS
  Filled 2016-11-20: qty 50

## 2016-11-20 MED ORDER — POTASSIUM CHLORIDE CRYS ER 20 MEQ PO TBCR
60.0000 meq | EXTENDED_RELEASE_TABLET | Freq: Once | ORAL | Status: AC
  Administered 2016-11-20: 60 meq via ORAL
  Filled 2016-11-20: qty 3

## 2016-11-20 MED ORDER — ONDANSETRON 4 MG PO TBDP
4.0000 mg | ORAL_TABLET | Freq: Once | ORAL | Status: DC | PRN
Start: 2016-11-20 — End: 2016-11-20

## 2016-11-20 MED ORDER — ALBUTEROL SULFATE HFA 108 (90 BASE) MCG/ACT IN AERS: 2.0000 | INHALATION_SPRAY | RESPIRATORY_TRACT | 0 refills | Status: AC | PRN

## 2016-11-20 MED ORDER — RANITIDINE HCL 150 MG PO TABS: 150.0000 mg | ORAL_TABLET | Freq: Two times a day (BID) | ORAL | 0 refills | Status: DC

## 2016-11-20 MED ORDER — IPRATROPIUM BROMIDE 0.02 % IN SOLN
0.5000 mg | Freq: Once | RESPIRATORY_TRACT | Status: AC
  Administered 2016-11-20: 0.5 mg via RESPIRATORY_TRACT
  Filled 2016-11-20: qty 2.5

## 2016-11-20 MED ORDER — KETOROLAC TROMETHAMINE 30 MG/ML IJ SOLN
15.0000 mg | Freq: Once | INTRAMUSCULAR | Status: AC
  Administered 2016-11-20: 15 mg via INTRAVENOUS
  Filled 2016-11-20: qty 1

## 2016-11-20 MED ORDER — ONDANSETRON 4 MG PO TBDP: 4.0000 mg | ORAL_TABLET | Freq: Three times a day (TID) | ORAL | 0 refills | Status: DC | PRN

## 2016-11-20 NOTE — Discharge Instructions (Signed)
Use zofran as prescribed, as needed for nausea. Stay well hydrated with small sips of fluids throughout the day. For your diarrhea, follow a BRAT (banana-rice-applesauce-toast) diet as described below for the next 24-48 hours. The 'BRAT' diet is suggested, then progress to diet as tolerated as symptoms abate. Call your regular doctor if bloody stools, persistent diarrhea, vomiting, fever or abdominal pain.  Your abdominal pain is likely from gastritis or an ulcer, or a viral illness, or due to intolerance of the medications you were started on. You will need to take zantac as directed, and avoid spicy/fatty/acidic foods, avoid soda/coffee/tea/alcohol. Avoid laying down flat within 30 minutes of eating. Avoid NSAIDs like ibuprofen/aleve/motrin/etc on an empty stomach. May consider using over the counter tums/maalox/pepto bismol as needed for additional relief. Use tylenol as needed for pain. Your ultrasound showed that your gallbladder was slightly abnormal however based on your lab work and exam being reassuring, it's unlikely the cause of your symptoms; however, if you abdominal pain becomes more severe and localized to the right upper abdomen, or there is vomiting with greenish-yellowish appearance, or any other changes/worsening symptoms, then return to the ER for further evaluation of possible gallbladder issues.   For your cough/cold symptoms: stop taking the medications you were prescribed last week, since this seems to have caused an upset stomach and your other symptoms. Use Mucinex for cough suppression/expectoration of mucus. Use netipot and flonase to help with nasal congestion. May consider over-the-counter Benadryl or other antihistamine to decrease secretions and for help with your symptoms. Use inhaler as directed, as needed for cough/chest congestion/wheezing/shortness of breath. STOP SMOKING!  Follow up with your regular doctor in one week for recheck of symptoms. Return to the ER for changes or  worsening symptoms.

## 2016-11-20 NOTE — ED Provider Notes (Signed)
[facility] DEPT Provider Note   CSN: [id number] Arrival date & time: 11/20/16  1320     History   Chief Complaint Chief Complaint  Patient presents with  . Emesis    HPI Wendy [name] is a 51 y.o. female with a PMHx of asthma, chronic pain, uterine fibroids, ovarian cysts s/p resection, endometriosis, peripheral edema, and other medical conditions listed below, who presents to the ED with complaints of generalized abdominal pain, nausea, vomiting, diarrhea, decreased appetite, decreased urine output, and darker urine that began 5 days ago after starting medications for a cold. Patient states that she was seen by her PCP Dr. Criss Rosales on 11/15/16 due to having a fever and URI symptoms including cough with white sputum production, rhinorrhea, wheezing. She was given an antibiotic that she believes is azithromycin as well as another medication that she thinks may be Tamiflu although she's not sure, states that the rx was for 10-12 pills and she takes it twice a day but can't remember the name or what it was for aside from being for her cold. She also started Allegra and Delsym. Shortly after starting the prescription medications, she developed generalized abdominal pain that she describes as 7/10 constant nonradiating generalized abdominal achy and churning-like pain that she describes as feeling "like an upset stomach like something's moving around in there", worse with eating/drinking, and with no treatments tried prior to arrival. She also reports that she's had no appetite, and has noticed that she has decreased UOP, and that her urine is darker than normal. She also reports she's had 4-5 episodes daily of nonbloody nonbilious emesis, and 1 episode daily of nonbloody softer than normal stools but denies any watery diarrhea. She continues to report having a cough with white sputum production and mild wheezing and rhinorrhea, however she states that her fevers have resolved. She acknowledges that she  is been taking NSAIDs recently for her URI symptoms however she does not take them very frequently. She admits to being a cigarette smoker, although she states that she quit last week due to her URI illness. She is postmenopausal and no longer gets menstrual cycles, denies any possibility of pregnancy. She denies any recent travel, sick contacts, suspicious food intake, or alcohol use. She also denies any ear pain or drainage, sore throat, hemoptysis, CP, SOB, hematemesis, melena, hematochezia, constipation, obstipation, dysuria, hematuria, malodorous urine, urinary frequency or urgency, vaginal bleeding or discharge, myalgias, arthralgias, numbness, tingling, focal weakness, or any other complaints at this time.   The history is provided by the patient and medical records. No language interpreter was used.  Emesis   Associated symptoms include abdominal pain, cough and diarrhea. Pertinent negatives include no arthralgias, no chills, no fever (none ongoing) and no myalgias.  Abdominal Pain   This is a new problem. The current episode started more than 2 days ago. The problem occurs constantly. The problem has not changed since onset.Associated with: starting medication. The pain is located in the generalized abdominal region. The quality of the pain is aching (and churning). The pain is at a severity of 7/10. The pain is moderate. Associated symptoms include diarrhea, nausea and vomiting. Pertinent negatives include fever (none ongoing), flatus, hematochezia, melena, constipation, dysuria, frequency, hematuria, arthralgias and myalgias. The symptoms are aggravated by eating. Nothing relieves the symptoms.    Past Medical History:  Diagnosis Date  . Anxiety   . Asthma    daily and prn inhalers  . Cervical disc disease    decreased range  of motion  . Chronic pain    shoulders, neck, lower back  . Deformity, hand    left  . Depression   . Endometriosis    Lupron injection Q 3 mos.  . Fluid  retention in legs   . Injury, brachial plexus    left  . Multiple allergies    takes allergy shots  . PONV (postoperative nausea and vomiting)   . 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 Wears partial dentures    upper partial    Patient Active Problem List   Diagnosis Date Noted  . BV (bacterial vaginosis) 03/06/2013  . ANXIETY 11/05/2007  . DEPRESSION 11/05/2007  . NAUSEA, CHRONIC 11/05/2007  . FIBROIDS, UTERUS 03/26/2007  . TOBACCO ABUSE 03/26/2007  . MIGRAINE, COMMON 03/26/2007  . OVARIAN CYST 03/26/2007  . 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 [place] DISEASE, LUMBOSACRAL SPINE 03/26/2007  . 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    Past Surgical History:  Procedure Laterality Date  . ANKLE RECONSTRUCTION  02/01/2012   Procedure: RECONSTRUCTION ANKLE;  Surgeon: Wylene Simmer, MD;  Location: [facility];  Service: Orthopedics;  Laterality: Right;  Excision of right calcaneonavicular coalition with autologus fat graft interposition  . DIAGNOSTIC LAPAROSCOPY  10/02/2008   peritoneal bx.  . MULTIPLE TOOTH EXTRACTIONS     upper teeth and wisdom teeth  . OVARIAN CYST REMOVAL  2000  . RIB RESECTION     left - cervical rib removal  . SHOULDER ARTHROSCOPY W/ ROTATOR CUFF REPAIR  07/2011   right  . SHOULDER SURGERY     left  . TOOTH EXTRACTION     x 1    OB History    Gravida Para Term Preterm AB Living   0 0 0 0 0 0   SAB TAB Ectopic Multiple Live Births   0 0 0 0         Home Medications    Prior to Admission medications   Medication Sig Start Date End Date Taking? Authorizing Provider  albuterol (PROVENTIL HFA;VENTOLIN HFA) 108 (90 BASE) MCG/ACT inhaler Inhale 2 puffs into the lungs every 6 (six) hours as needed for wheezing or shortness of breath.     Historical Provider, MD  beclomethasone (QVAR) 80 MCG/ACT inhaler Inhale 1 puff into the lungs 2 (two) times daily.     Historical Provider, MD  cetirizine  (ZYRTEC) 10 MG tablet Take 10 mg by mouth daily.    Historical Provider, MD  clonazePAM (KLONOPIN) 0.5 MG tablet Take 0.5 mg by mouth 2 (two) times daily.    Historical Provider, MD  cyclobenzaprine (FLEXERIL) 10 MG tablet Take 10 mg by mouth 2 (two) times daily as needed. For pain    Historical Provider, MD  docusate sodium (COLACE) 100 MG capsule Take 100 mg by mouth 2 (two) times daily.    Historical Provider, MD  EPINEPHrine (EPIPEN JR) 0.15 MG/0.3ML injection Inject 0.15 mg into the muscle daily as needed for anaphylaxis.     Historical Provider, MD  ergocalciferol (VITAMIN D2) 50000 UNITS capsule Take 50,000 Units by mouth once a week. Sundays    Historical Provider, MD  escitalopram (LEXAPRO) 20 MG tablet Take 20 mg by mouth daily.     Historical Provider, MD  esomeprazole (NEXIUM) 20 MG capsule Take 20 mg by mouth daily at 12 noon.    Historical Provider, MD  estradiol (ESTRACE) 1 MG tablet Take 1 mg by mouth daily. 05/05/13   Historical  Provider, MD  estrogens, conjugated, (PREMARIN) 0.625 MG tablet Take 0.625 mg by mouth daily. Take daily for 21 days then do not take for 7 days.    Historical Provider, MD  furosemide (LASIX) 20 MG tablet Take 10 mg by mouth as needed. For swelling    Historical Provider, MD  gabapentin (NEURONTIN) 400 MG capsule Take 400 mg by mouth 3 (three) times daily.     Historical Provider, MD  HYDROcodone-acetaminophen (NORCO/VICODIN) 5-325 MG per tablet Take 1 tablet by mouth every 6 (six) hours as needed for pain. 03/31/13   Charlann Lange, PA-C  hydrocortisone 2.5 % lotion Apply topically 2 (two) times daily. 03/24/14   Julianne Rice, MD  lurasidone (LATUDA) 40 MG TABS tablet Take 40 mg by mouth daily with breakfast.    Historical Provider, MD  metroNIDAZOLE (FLAGYL) 500 MG tablet Take 1 tablet (500 mg total) by mouth 2 (two) times daily. 08/26/15   Shelly Bombard, MD  nortriptyline (PAMELOR) 50 MG capsule Take 50 mg by mouth at bedtime.    Historical Provider, MD   nystatin cream (MYCOSTATIN) Apply 1 application topically 2 (two) times daily as needed for dry skin.    Historical Provider, MD  potassium chloride SA (K-DUR,KLOR-CON) 20 MEQ tablet Take 20 mEq by mouth 2 (two) times daily as needed (when taking lasix).     Historical Provider, MD  QUEtiapine (SEROQUEL) 100 MG tablet Take 100 mg by mouth at bedtime.     Historical Provider, MD  rosuvastatin (CRESTOR) 10 MG tablet Take 10 mg by mouth daily.    Historical Provider, MD  tiZANidine (ZANAFLEX) 4 MG tablet Take 4 mg by mouth every 6 (six) hours as needed for muscle spasms.    Historical Provider, MD  triamcinolone cream (KENALOG) 0.1 % Apply 1 application topically 2 (two) times daily as needed. For rash    Historical Provider, MD  valACYclovir (VALTREX) 1000 MG tablet Take 1 tablet po daily x 3 days prn for each outbreak. 08/26/15   Shelly Bombard, MD    Family History No family history on file.  Social History Social History  Substance Use Topics  . Smoking status: Current Every Day Smoker    Packs/day: 0.50    Years: 20.00    Types: Cigarettes  . Smokeless tobacco: Never Used  . Alcohol use No     Allergies   Chocolate; Latex; Shellfish allergy; Fish-derived products; Peanut-containing drug products; and Soap   Review of Systems Review of Systems  Constitutional: Positive for appetite change. Negative for chills and fever (none ongoing).  HENT: Positive for rhinorrhea. Negative for ear discharge, ear pain and sore throat.   Respiratory: Positive for cough and wheezing. Negative for shortness of breath.   Cardiovascular: Negative for chest pain.  Gastrointestinal: Positive for abdominal pain, diarrhea, nausea and vomiting. Negative for blood in stool, constipation, flatus, hematochezia and melena.  Genitourinary: Positive for decreased urine volume (and darker than normal but not malodorous). Negative for dysuria, frequency, hematuria, vaginal bleeding and vaginal discharge.    Musculoskeletal: Negative for arthralgias and myalgias.  Skin: Negative for color change.  Allergic/Immunologic: Negative for immunocompromised state.  Neurological: Negative for weakness and numbness.  Psychiatric/Behavioral: Negative for confusion.   10 Systems reviewed and are negative for acute change except as noted in the HPI.   Physical Exam Updated Vital Signs BP 118/58   Pulse 86   Temp 98 F (36.7 C) (Oral)   Resp 18   LMP 07/26/2015  SpO2 95%   Physical Exam  Constitutional: She is oriented to person, place, and time. Vital signs are normal. She appears well-developed and well-nourished.  Non-toxic appearance. No distress.  Afebrile, nontoxic, NAD  HENT:  Head: Normocephalic and atraumatic.  Mouth/Throat: Oropharynx is clear and moist. Mucous membranes are dry.  Mildly dry lips  Eyes: Conjunctivae and EOM are normal. Right eye exhibits no discharge. Left eye exhibits no discharge.  Neck: Normal range of motion. Neck supple.  Cardiovascular: Normal rate, regular rhythm, normal heart sounds and intact distal pulses.  Exam reveals no gallop and no friction rub.   No murmur heard. Pulmonary/Chest: Effort normal. No respiratory distress. She has no decreased breath sounds. She has wheezes. She has rhonchi. She has no rales.  Scattered rhonchi diffusely in all lung fields, mild faint expiratory wheezing in all lung fields, no rales, no hypoxia or increased WOB, speaking in full sentences, SpO2 95% on RA   Abdominal: Soft. Normal appearance and bowel sounds are normal. She exhibits no distension. There is generalized tenderness and tenderness in the right upper quadrant. There is positive Murphy's sign. There is no rigidity, no rebound, no guarding, no CVA tenderness and no tenderness at McBurney's point.    Soft, obese but not obviously distended, +BS throughout, with mild diffuse generalized abd TTP throughout but mostly focalized to the upper abdomen and particularly in  the epigastrium/RUQ area, no r/g/r, +murphy's, neg mcburney's, no CVA TTP   Musculoskeletal: Normal range of motion.  Neurological: She is alert and oriented to person, place, and time. She has normal strength. No sensory deficit.  Skin: Skin is warm, dry and intact. No rash noted.  Psychiatric: She has a normal mood and affect.  Nursing note and vitals reviewed.    ED Treatments / Results  Labs (all labs ordered are listed, but only abnormal results are displayed) Labs Reviewed  COMPREHENSIVE METABOLIC PANEL - Abnormal; Notable for the following:       Result Value   Potassium 3.1 (*)    Glucose, Bld 100 (*)    All other components within normal limits  URINALYSIS, ROUTINE W REFLEX MICROSCOPIC - Abnormal; Notable for the following:    Color, Urine AMBER (*)    APPearance HAZY (*)    Hgb urine dipstick SMALL (*)    Protein, ur 100 (*)    Bacteria, UA RARE (*)    Squamous Epithelial / LPF 6-30 (*)    All other components within normal limits  LIPASE, BLOOD  CBC    EKG  EKG Interpretation None       Radiology Dg Chest 2 View  Result Date: 11/20/2016 CLINICAL DATA:  Cough and fever EXAM: CHEST  2 VIEW COMPARISON:  Chest radiograph 03/24/2014 FINDINGS: Cardiomediastinal contours are normal. No pleural effusion or pneumothorax. No focal airspace consolidation or pulmonary edema. IMPRESSION: No active cardiopulmonary disease. Electronically Signed   By: Ulyses Jarred M.D.   On: 11/20/2016 16:28   US Abdomen Complete  Result Date: 11/20/2016 CLINICAL DATA:  Right upper quadrant pain with Percell Miller sign. EXAM: ABDOMEN ULTRASOUND COMPLETE COMPARISON:  06/04/2013 CT scan FINDINGS: Gallbladder: Borderline gallbladder wall thickening at 3 mm. Sonographic Murphy's sign was absent. No gallstones seen. The gallbladder is somewhat contracted during imaging. Common bile duct: Diameter: 3 mm Liver: No focal lesion identified. Within normal limits in parenchymal echogenicity. IVC: No  abnormality visualized. Pancreas: Visualized portion unremarkable. Spleen: Size and appearance within normal limits. Right Kidney: Length: 10.9 cm. Echogenicity within normal  limits. No mass or hydronephrosis visualized. Left Kidney: Length: 10.4 cm. Echogenicity within normal limits. No mass or hydronephrosis visualized. Abdominal aorta: Portions are obscured by gas in the bowel. No aneurysm visualized. Other findings: None. IMPRESSION: 1. Borderline gallbladder wall thickening at 3 mm, although the gallbladder does appear somewhat small in caliber suggesting contraction, and sonographic Murphy's sign is absent. No gallstones seen. No pericholecystic fluid. Correlate clinically in assessing for cholecystitis. Electronically Signed   By: Van Clines M.D.   On: 11/20/2016 18:32    Procedures Procedures (including critical care time)  Medications Ordered in ED Medications  sodium chloride 0.9 % bolus 1,000 mL (0 mLs Intravenous Stopped 11/20/16 1625)  potassium chloride SA (K-DUR,KLOR-CON) CR tablet 60 mEq (60 mEq Oral Given 11/20/16 1528)  ondansetron (ZOFRAN) injection 4 mg (4 mg Intravenous Given 11/20/16 1528)  ketorolac (TORADOL) 30 MG/ML injection 15 mg (15 mg Intravenous Given 11/20/16 1528)  famotidine (PEPCID) IVPB 20 mg premix (0 mg Intravenous Stopped 11/20/16 1626)  albuterol (PROVENTIL) (2.5 MG/3ML) 0.083% nebulizer solution 5 mg (5 mg Nebulization Given 11/20/16 1528)  ipratropium (ATROVENT) nebulizer solution 0.5 mg (0.5 mg Nebulization Given 11/20/16 1528)     Initial Impression / Assessment and Plan / ED Course  I have reviewed the triage vital signs and the nursing notes.  Pertinent labs & imaging results that were available during my care of the patient were reviewed by me and considered in my medical decision making (see chart for details).     51 y.o. female here with abd pain/n/v/d and diminished appetite and UOP and darker urine since starting 2 meds for a cold that  started last week and she was seen by her PCP and was prescribed an antibiotic and some other medication; she thinks the meds were azithromycin and tamiflu. Continues to have cough with white sputum production, mild rhinorrhea, and wheezing, however fever resolved last week. On exam, mild diffuse abd TTP mostly in the upper abdomen especially RUQ, with +murphy's, nonperitoneal; lung sounds with scattered rhonchi and faint expiratory wheezing throughout all lung fields; overall nontoxic and well appearing, lips mildly dry. Labs: lipase WNL, CBC WNL, CMP with mildly low K 3.1 will replete here. Awaiting U/A sample. Will obtain Abd U/S, CXR, and give duoneb, pepcid, toradol, zofran, Kdur, and fluids, then reassess shortly.  6:41 PM U/A with 6-30 squamous cells so fairly contaminated, no evidence of UTI. Abd U/S with very small amount of gallbladder wall thickening to 73mm however neg sonographic murphy's and no gallstones seen, no pericholecystic fluid, and gallbladder small caliber anyway; no CBD dilation or other concerning findings; given normal LFTs, and reassuring U/S findings otherwise, doubt that her current symptoms are definitely due to cholecystitis, however strict return precautions advised regarding biliary etiologies. CXR clear. Lung sounds greatly improved after duoneb, no ongoing rhonchi or wheezing. Pt feeling much better after meds, tolerating PO well, feels well. Overall symptoms likely related to gastritis/viral gastroenteritis or possibly just medication intolerance; also likely viral URI. Will send home with rx for zofran and zantac, advised diet modifications for GERD/gastritis/gastroenteritis, tylenol/tums/maalox/pepto for pain/symptom relief; also will send home with inhaler rx, discussed OTC meds for URI/cough symptom control, advised discontinuation of the meds that she was rx'd by her PCP as this is likely a viral illness and she is intolerant of the meds anyway; smoking cessation advised.  F/up with PCP in 1wk for recheck of symptoms. I explained the diagnosis and have given explicit precautions to return to the ER  including for any other new or worsening symptoms. The patient understands and accepts the medical plan as it's been dictated and I have answered their questions. Discharge instructions concerning home care and prescriptions have been given. The patient is STABLE and is discharged to home in good condition.   Final Clinical Impressions(s) / ED Diagnoses   Final diagnoses:  Generalized abdominal pain  Nausea vomiting and diarrhea  Cough  Upper respiratory tract infection, unspecified type  Tobacco user  Hypokalemia    New Prescriptions New Prescriptions   ALBUTEROL (PROVENTIL HFA;VENTOLIN HFA) 108 (90 BASE) MCG/ACT INHALER    Inhale 2 puffs into the lungs every 4 (four) hours as needed for wheezing or shortness of breath (cough).   ONDANSETRON (ZOFRAN ODT) 4 MG DISINTEGRATING TABLET    Take 1 tablet (4 mg total) by mouth every 8 (eight) hours as needed for nausea or vomiting.   RANITIDINE (ZANTAC) 150 MG TABLET    Take 1 tablet (150 mg total) by mouth 2 (two) times daily.     [address] Nichalos Brenton, PA-C 11/20/16 1844    Margette Fast, MD 11/20/16 2022

## 2016-11-20 NOTE — ED Notes (Signed)
Attempted to call us to inform them that the pt is ready but no answer

## 2016-11-20 NOTE — ED Notes (Signed)
Patient transported to X-ray 

## 2016-11-20 NOTE — ED Notes (Signed)
ED Provider at bedside. 

## 2016-11-20 NOTE — ED Notes (Signed)
Pt returned from xray and ambulatory to the restroom for a urine sample.

## 2016-11-20 NOTE — ED Notes (Signed)
Patient transported to US 

## 2016-11-20 NOTE — ED Notes (Signed)
Pt returned from US

## 2016-11-20 NOTE — ED Triage Notes (Signed)
Pt reports vomiting that began on 3/8 after she was taking meds her doctor prescribed her for a cold. Pt reports being able to keep down minimal fluids. Vomiting in triage.

## 2016-11-21 DIAGNOSIS — M542 Cervicalgia: Secondary | ICD-10-CM | POA: Diagnosis not present

## 2016-11-21 DIAGNOSIS — M545 Low back pain: Secondary | ICD-10-CM | POA: Diagnosis not present

## 2017-02-19 DIAGNOSIS — M545 Low back pain: Secondary | ICD-10-CM | POA: Diagnosis not present

## 2017-02-19 DIAGNOSIS — I10 Essential (primary) hypertension: Secondary | ICD-10-CM | POA: Diagnosis not present

## 2017-02-19 DIAGNOSIS — M542 Cervicalgia: Secondary | ICD-10-CM | POA: Diagnosis not present

## 2017-04-13 DIAGNOSIS — M940 Chondrocostal junction syndrome [Tietze]: Secondary | ICD-10-CM | POA: Diagnosis not present

## 2017-04-27 DIAGNOSIS — I10 Essential (primary) hypertension: Secondary | ICD-10-CM | POA: Diagnosis not present

## 2017-04-27 DIAGNOSIS — T7840XA Allergy, unspecified, initial encounter: Secondary | ICD-10-CM | POA: Diagnosis not present

## 2017-05-03 ENCOUNTER — Ambulatory Visit (INDEPENDENT_AMBULATORY_CARE_PROVIDER_SITE_OTHER): Payer: Medicare Other | Admitting: Obstetrics

## 2017-05-03 ENCOUNTER — Other Ambulatory Visit (HOSPITAL_COMMUNITY)
Admission: RE | Admit: 2017-05-03 | Discharge: 2017-05-03 | Disposition: A | Discharge status: Home / Self Care | Payer: Medicare Other | Source: Ambulatory Visit | Attending: Obstetrics | Admitting: Obstetrics

## 2017-05-03 ENCOUNTER — Encounter: Payer: Self-pay | Admitting: Obstetrics

## 2017-05-03 VITALS — BP 145/86 | HR 76 | Wt 185.2 lb

## 2017-05-03 DIAGNOSIS — R102 Pelvic and perineal pain: Secondary | ICD-10-CM | POA: Diagnosis not present

## 2017-05-03 DIAGNOSIS — Z780 Asymptomatic menopausal state: Secondary | ICD-10-CM | POA: Diagnosis not present

## 2017-05-03 DIAGNOSIS — N898 Other specified noninflammatory disorders of vagina: Secondary | ICD-10-CM

## 2017-05-03 DIAGNOSIS — I10 Essential (primary) hypertension: Secondary | ICD-10-CM | POA: Insufficient documentation

## 2017-05-03 DIAGNOSIS — F17210 Nicotine dependence, cigarettes, uncomplicated: Secondary | ICD-10-CM | POA: Insufficient documentation

## 2017-05-03 DIAGNOSIS — E663 Overweight: Secondary | ICD-10-CM | POA: Diagnosis not present

## 2017-05-03 DIAGNOSIS — F329 Major depressive disorder, single episode, unspecified: Secondary | ICD-10-CM | POA: Diagnosis not present

## 2017-05-03 DIAGNOSIS — Z113 Encounter for screening for infections with a predominantly sexual mode of transmission: Secondary | ICD-10-CM

## 2017-05-03 DIAGNOSIS — F419 Anxiety disorder, unspecified: Secondary | ICD-10-CM | POA: Diagnosis not present

## 2017-05-03 DIAGNOSIS — Z01419 Encounter for gynecological examination (general) (routine) without abnormal findings: Secondary | ICD-10-CM | POA: Insufficient documentation

## 2017-05-03 DIAGNOSIS — N951 Menopausal and female climacteric states: Secondary | ICD-10-CM

## 2017-05-03 NOTE — Progress Notes (Signed)
Patient is in the office for her annual exam- patient is having pain with intercourse. (abdominal pain- she thinks she is having resurgence of endometriosis pain) Patient is having 3 cycles a tear- she is trying to stop.

## 2017-05-04 ENCOUNTER — Other Ambulatory Visit: Payer: Medicare Other

## 2017-05-04 ENCOUNTER — Encounter: Payer: Self-pay | Admitting: Obstetrics

## 2017-05-04 DIAGNOSIS — N898 Other specified noninflammatory disorders of vagina: Secondary | ICD-10-CM

## 2017-05-04 DIAGNOSIS — Z113 Encounter for screening for infections with a predominantly sexual mode of transmission: Secondary | ICD-10-CM

## 2017-05-04 LAB — CERVICOVAGINAL ANCILLARY ONLY
Bacterial vaginitis: POSITIVE — AB
CANDIDA VAGINITIS: NEGATIVE
Chlamydia: NEGATIVE
NEISSERIA GONORRHEA: NEGATIVE
Trichomonas: NEGATIVE

## 2017-05-04 NOTE — Progress Notes (Signed)
Subjective:        Wendy [name] is a 51 y.o. female here for a routine exam.  Current complaints: Painful intercourse..    Personal health questionnaire:  Is [name], have a family history of breast and/or ovarian cancer: no Is there a family history of uterine cancer diagnosed at age < 97, gastrointestinal cancer, urinary tract cancer, family member who is a Field seismologist syndrome-associated carrier: no Is the patient overweight and hypertensive, family history of diabetes, personal history of gestational diabetes, preeclampsia or PCOS: no Is patient over 54, have PCOS,  family history of premature CHD under age 40, diabetes, smoke, have hypertension or peripheral artery disease:  no At any time, has a partner hit, kicked or otherwise hurt or frightened you?: no Over the past 2 weeks, have you felt down, depressed or hopeless?: no Over the past 2 weeks, have you felt little interest or pleasure in doing things?:no   Gynecologic History Patient's last menstrual period was 07/12/2016 (approximate). Contraception: none Last Pap: 2016. Results were: normal Last mammogram: 2016. Results were: normal  Obstetric History OB History  Gravida Para Term Preterm AB Living  0 0 0 0 0 0  SAB TAB Ectopic Multiple Live Births  0 0 0 0          Past Medical History:  Diagnosis Date  . Anxiety   . Asthma    daily and prn inhalers  . Cervical disc disease    decreased range of motion  . Chronic pain    shoulders, neck, lower back  . Deformity, hand    left  . Depression   . Endometriosis    Lupron injection Q 3 mos.  . Fluid retention in legs   . Injury, brachial plexus    left  . Multiple allergies    takes allergy shots  . PONV (postoperative nausea and vomiting)   . 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 Wears partial dentures    upper partial    Past Surgical History:  Procedure Laterality Date  . ANKLE RECONSTRUCTION  02/01/2012    Procedure: RECONSTRUCTION ANKLE;  Surgeon: Wylene Simmer, MD;  Location: [facility];  Service: Orthopedics;  Laterality: Right;  Excision of right calcaneonavicular coalition with autologus fat graft interposition  . DIAGNOSTIC LAPAROSCOPY  10/02/2008   peritoneal bx.  . MULTIPLE TOOTH EXTRACTIONS     upper teeth and wisdom teeth  . OVARIAN CYST REMOVAL  2000  . RIB RESECTION     left - cervical rib removal  . SHOULDER ARTHROSCOPY W/ ROTATOR CUFF REPAIR  07/2011   right  . SHOULDER SURGERY     left  . TOOTH EXTRACTION     x 1     Current Outpatient Prescriptions:  .  albuterol (PROVENTIL HFA;VENTOLIN HFA) 108 (90 Base) MCG/ACT inhaler, Inhale 2 puffs into the lungs every 4 (four) hours as needed for wheezing or shortness of breath (cough)., Disp: 1 Inhaler, Rfl: 0 .  beclomethasone (QVAR) 80 MCG/ACT inhaler, Inhale 1 puff into the lungs 2 (two) times daily. , Disp: , Rfl:  .  cyclobenzaprine (FLEXERIL) 10 MG tablet, Take 10 mg by mouth 2 (two) times daily as needed. For pain, Disp: , Rfl:  .  docusate sodium (COLACE) 100 MG capsule, Take 100 mg by mouth 2 (two) times daily., Disp: , Rfl:  .  EPINEPHrine (EPIPEN JR) 0.15 MG/0.3ML injection, Inject 0.15 mg into the muscle daily as needed  for anaphylaxis. , Disp: , Rfl:  .  ergocalciferol (VITAMIN D2) 50000 UNITS capsule, Take 50,000 Units by mouth once a week. Sundays, Disp: , Rfl:  .  escitalopram (LEXAPRO) 20 MG tablet, Take 20 mg by mouth daily. , Disp: , Rfl:  .  esomeprazole (NEXIUM) 20 MG capsule, Take 20 mg by mouth daily at 12 noon., Disp: , Rfl:  .  furosemide (LASIX) 20 MG tablet, Take 10 mg by mouth as needed. For swelling, Disp: , Rfl:  .  gabapentin (NEURONTIN) 400 MG capsule, Take 400 mg by mouth 3 (three) times daily. , Disp: , Rfl:  .  HYDROcodone-acetaminophen (NORCO/VICODIN) 5-325 MG per tablet, Take 1 tablet by mouth every 6 (six) hours as needed for pain., Disp: 10 tablet, Rfl: 0 .  hydrocortisone 2.5 %  lotion, Apply topically 2 (two) times daily., Disp: 59 mL, Rfl: 0 .  lurasidone (LATUDA) 40 MG TABS tablet, Take 40 mg by mouth daily with breakfast., Disp: , Rfl:  .  nortriptyline (PAMELOR) 50 MG capsule, Take 50 mg by mouth at bedtime., Disp: , Rfl:  .  ondansetron (ZOFRAN ODT) 4 MG disintegrating tablet, Take 1 tablet (4 mg total) by mouth every 8 (eight) hours as needed for nausea or vomiting., Disp: 15 tablet, Rfl: 0 .  potassium chloride SA (K-DUR,KLOR-CON) 20 MEQ tablet, Take 20 mEq by mouth 2 (two) times daily as needed (when taking lasix). , Disp: , Rfl:  .  QUEtiapine (SEROQUEL) 100 MG tablet, Take 100 mg by mouth at bedtime. , Disp: , Rfl:  .  ranitidine (ZANTAC) 150 MG tablet, Take 1 tablet (150 mg total) by mouth 2 (two) times daily., Disp: 30 tablet, Rfl: 0 .  rosuvastatin (CRESTOR) 10 MG tablet, Take 10 mg by mouth daily., Disp: , Rfl:  .  valACYclovir (VALTREX) 1000 MG tablet, Take 1 tablet po daily x 3 days prn for each outbreak., Disp: 30 tablet, Rfl: prn Allergies  Allergen Reactions  . Chocolate Hives  . Latex Hives, Itching and Swelling  . Shellfish Allergy   . Fish-Derived Products Nausea And Vomiting  . Peanut-Containing Drug Products Hives  . Soap Rash    Social History  Substance Use Topics  . Smoking status: Current Every Day Smoker    Packs/day: 1.00    Years: 20.00    Types: Cigarettes  . Smokeless tobacco: Never Used  . Alcohol use No     Comment: occasional    Family History  Problem Relation Age of Onset  . Cancer Mother   . Dementia Father   . Stroke Father       Review of Systems  Constitutional: negative for fatigue and weight loss Respiratory: negative for cough and wheezing Cardiovascular: negative for chest pain, fatigue and palpitations Gastrointestinal: negative for abdominal pain and change in bowel habits Musculoskeletal:negative for myalgias Neurological: negative for gait problems and tremors Behavioral/Psych: negative for  abusive relationship, depression Endocrine: negative for temperature intolerance    Genitourinary:negative for abnormal menstrual periods, genital lesions, hot flashes, sexual problems and vaginal discharge Integument/breast: negative for breast lump, breast tenderness, nipple discharge and skin lesion(s)    Objective:       BP (!) 145/86   Pulse 76   Wt 185 lb 3.2 oz (84 kg)   LMP 07/12/2016 (Approximate) Comment: 2-3 day cycle  BMI 37.41 kg/m  General:   alert  Skin:   no rash or abnormalities  Lungs:   clear to auscultation bilaterally  Heart:  regular rate and rhythm, S1, S2 normal, no murmur, click, rub or gallop  Breasts:   normal without suspicious masses, skin or nipple changes or axillary nodes  Abdomen:  normal findings: no organomegaly, soft, non-tender and no hernia  Pelvis:  External genitalia: normal general appearance Urinary system: urethral meatus normal and bladder without fullness, nontender Vaginal: normal without tenderness, induration or masses Cervix: normal appearance Adnexa: normal bimanual exam Uterus: anteverted and non-tender, normal size   Lab Review Urine pregnancy test Labs reviewed yes Radiologic studies reviewed yes  50% of 20 min visit spent on counseling and coordination of care.    Assessment and Plan:     1. Encounter for routine gynecological examination with Papanicolaou smear of cervix Rx: - Cervicovaginal ancillary only  2. Vaginal discharge Rx: - Cervicovaginal ancillary only  3. Pelvic pain in female Rx: - US Transvaginal Non-OB; Future - US Pelvis Complete; Future  4. Perimenopause  5. STD Screen     Plan:    Education reviewed: calcium supplements, depression evaluation, low fat, low cholesterol diet, safe sex/STD prevention, self breast exams and weight bearing exercise. Follow up in: 2 weeks.   No orders of the defined types were placed in this encounter.  Orders Placed This Encounter  Procedures  . US  Transvaginal Non-OB    Standing Status:   Future    Standing Expiration Date:   07/04/2018    Order Specific Question:   Reason for Exam (SYMPTOM  OR DIAGNOSIS REQUIRED)    Answer:   Pelvic pain    Order Specific Question:   Preferred imaging location?    Answer:   [facility]  . US Pelvis Complete    Standing Status:   Future    Standing Expiration Date:   07/04/2018    Order Specific Question:   Reason for Exam (SYMPTOM  OR DIAGNOSIS REQUIRED)    Answer:   Pelvic pain    Order Specific Question:   Preferred imaging location?    Answer:   [facility]

## 2017-05-05 ENCOUNTER — Other Ambulatory Visit: Payer: Self-pay | Admitting: Obstetrics

## 2017-05-05 DIAGNOSIS — B9689 Other specified bacterial agents as the cause of diseases classified elsewhere: Secondary | ICD-10-CM

## 2017-05-05 DIAGNOSIS — N760 Acute vaginitis: Principal | ICD-10-CM

## 2017-05-05 LAB — HIV ANTIBODY (ROUTINE TESTING W REFLEX): HIV SCREEN 4TH GENERATION: NONREACTIVE

## 2017-05-05 LAB — HEPATITIS C ANTIBODY: Hep C Virus Ab: <0.1 s/co ratio (ref 0.0–0.9)

## 2017-05-05 LAB — HEPATITIS B SURFACE ANTIGEN: Hepatitis B Surface Ag: NEGATIVE

## 2017-05-05 LAB — RPR: RPR Ser Ql: NONREACTIVE

## 2017-05-05 MED ORDER — METRONIDAZOLE 500 MG PO TABS: 500.0000 mg | ORAL_TABLET | Freq: Two times a day (BID) | ORAL | 2 refills | Status: DC

## 2017-05-08 ENCOUNTER — Telehealth: Payer: Self-pay

## 2017-05-08 NOTE — Telephone Encounter (Signed)
Advised of results and rx sent by provider, pt stated that she already picked up rx.

## 2017-05-09 ENCOUNTER — Ambulatory Visit
Admission: RE | Admit: 2017-05-09 | Discharge: 2017-05-09 | Disposition: A | Discharge status: Home / Self Care | Payer: Medicare Other | Source: Ambulatory Visit | Attending: Obstetrics | Admitting: Obstetrics

## 2017-05-09 DIAGNOSIS — R102 Pelvic and perineal pain: Secondary | ICD-10-CM

## 2017-05-17 DIAGNOSIS — M542 Cervicalgia: Secondary | ICD-10-CM | POA: Diagnosis not present

## 2017-05-17 DIAGNOSIS — R030 Elevated blood-pressure reading, without diagnosis of hypertension: Secondary | ICD-10-CM | POA: Diagnosis not present

## 2017-05-17 DIAGNOSIS — M545 Low back pain: Secondary | ICD-10-CM | POA: Diagnosis not present

## 2017-07-09 DIAGNOSIS — R070 Pain in throat: Secondary | ICD-10-CM | POA: Diagnosis not present

## 2017-07-09 DIAGNOSIS — J399 Disease of upper respiratory tract, unspecified: Secondary | ICD-10-CM | POA: Diagnosis not present

## 2017-07-09 DIAGNOSIS — I10 Essential (primary) hypertension: Secondary | ICD-10-CM | POA: Diagnosis not present

## 2017-07-20 ENCOUNTER — Other Ambulatory Visit (HOSPITAL_COMMUNITY)
Admission: RE | Admit: 2017-07-20 | Discharge: 2017-07-20 | Disposition: A | Discharge status: Home / Self Care | Payer: Medicare Other | Source: Ambulatory Visit | Attending: Family Medicine | Admitting: Family Medicine

## 2017-07-20 ENCOUNTER — Other Ambulatory Visit: Payer: Self-pay | Admitting: Family Medicine

## 2017-07-20 DIAGNOSIS — Z113 Encounter for screening for infections with a predominantly sexual mode of transmission: Secondary | ICD-10-CM | POA: Insufficient documentation

## 2017-07-20 DIAGNOSIS — N760 Acute vaginitis: Secondary | ICD-10-CM | POA: Diagnosis not present

## 2017-07-25 LAB — URINE CYTOLOGY ANCILLARY ONLY
BACTERIAL VAGINITIS: NEGATIVE
CANDIDA VAGINITIS: NEGATIVE
CHLAMYDIA, DNA PROBE: NEGATIVE
NEISSERIA GONORRHEA: NEGATIVE
TRICH (WINDOWPATH): NEGATIVE

## 2017-08-14 DIAGNOSIS — M545 Low back pain: Secondary | ICD-10-CM | POA: Diagnosis not present

## 2017-08-14 DIAGNOSIS — I10 Essential (primary) hypertension: Secondary | ICD-10-CM | POA: Diagnosis not present

## 2017-08-14 DIAGNOSIS — M542 Cervicalgia: Secondary | ICD-10-CM | POA: Diagnosis not present

## 2017-09-13 ENCOUNTER — Ambulatory Visit: Payer: Medicare Other | Admitting: Allergy

## 2017-09-14 ENCOUNTER — Ambulatory Visit: Payer: Medicare Other | Admitting: Allergy

## 2017-11-01 ENCOUNTER — Ambulatory Visit: Payer: Medicare Other | Admitting: Allergy

## 2017-11-05 DIAGNOSIS — I10 Essential (primary) hypertension: Secondary | ICD-10-CM | POA: Diagnosis not present

## 2017-11-05 DIAGNOSIS — M542 Cervicalgia: Secondary | ICD-10-CM | POA: Diagnosis not present

## 2017-11-05 DIAGNOSIS — M545 Low back pain: Secondary | ICD-10-CM | POA: Diagnosis not present

## 2017-12-04 ENCOUNTER — Encounter (HOSPITAL_COMMUNITY): Payer: Self-pay

## 2017-12-04 ENCOUNTER — Emergency Department (HOSPITAL_COMMUNITY): Payer: Medicare Other

## 2017-12-04 ENCOUNTER — Other Ambulatory Visit: Payer: Self-pay

## 2017-12-04 DIAGNOSIS — Z5321 Procedure and treatment not carried out due to patient leaving prior to being seen by health care provider: Secondary | ICD-10-CM | POA: Diagnosis not present

## 2017-12-04 DIAGNOSIS — M79601 Pain in right arm: Secondary | ICD-10-CM | POA: Insufficient documentation

## 2017-12-04 DIAGNOSIS — S51811A Laceration without foreign body of right forearm, initial encounter: Secondary | ICD-10-CM | POA: Diagnosis not present

## 2017-12-04 NOTE — ED Triage Notes (Signed)
Patient had hand drill fall onto the right forearm. Bleeding controlled with band-aid, stated tetanus in 2017.  A&Ox4 no other injuries.  Having muscle cramping in the right arm as well.

## 2017-12-05 ENCOUNTER — Emergency Department (HOSPITAL_COMMUNITY)
Admission: EM | Admit: 2017-12-05 | Discharge: 2017-12-05 | Disposition: A | Discharge status: Home / Self Care | Payer: Medicare Other | Attending: Emergency Medicine | Admitting: Emergency Medicine

## 2017-12-05 NOTE — ED Notes (Signed)
Pt has decided to leave 

## 2017-12-06 ENCOUNTER — Other Ambulatory Visit: Payer: Self-pay

## 2017-12-06 DIAGNOSIS — L0390 Cellulitis, unspecified: Secondary | ICD-10-CM | POA: Diagnosis not present

## 2017-12-06 DIAGNOSIS — M62830 Muscle spasm of back: Secondary | ICD-10-CM | POA: Diagnosis not present

## 2017-12-06 DIAGNOSIS — E118 Type 2 diabetes mellitus with unspecified complications: Secondary | ICD-10-CM | POA: Diagnosis not present

## 2017-12-06 DIAGNOSIS — E785 Hyperlipidemia, unspecified: Secondary | ICD-10-CM | POA: Diagnosis not present

## 2017-12-06 DIAGNOSIS — I10 Essential (primary) hypertension: Secondary | ICD-10-CM | POA: Diagnosis not present

## 2017-12-06 DIAGNOSIS — E039 Hypothyroidism, unspecified: Secondary | ICD-10-CM | POA: Diagnosis not present

## 2017-12-06 DIAGNOSIS — E782 Mixed hyperlipidemia: Secondary | ICD-10-CM | POA: Diagnosis not present

## 2017-12-14 DIAGNOSIS — M542 Cervicalgia: Secondary | ICD-10-CM | POA: Diagnosis not present

## 2017-12-14 DIAGNOSIS — I10 Essential (primary) hypertension: Secondary | ICD-10-CM | POA: Diagnosis not present

## 2017-12-14 DIAGNOSIS — M545 Low back pain: Secondary | ICD-10-CM | POA: Diagnosis not present

## 2017-12-17 DIAGNOSIS — Z1231 Encounter for screening mammogram for malignant neoplasm of breast: Secondary | ICD-10-CM | POA: Diagnosis not present

## 2017-12-17 DIAGNOSIS — Z803 Family history of malignant neoplasm of breast: Secondary | ICD-10-CM | POA: Diagnosis not present

## 2017-12-26 DIAGNOSIS — I10 Essential (primary) hypertension: Secondary | ICD-10-CM | POA: Diagnosis not present

## 2017-12-26 DIAGNOSIS — Z1211 Encounter for screening for malignant neoplasm of colon: Secondary | ICD-10-CM | POA: Diagnosis not present

## 2017-12-26 DIAGNOSIS — R1310 Dysphagia, unspecified: Secondary | ICD-10-CM | POA: Diagnosis not present

## 2018-01-08 DIAGNOSIS — K635 Polyp of colon: Secondary | ICD-10-CM | POA: Diagnosis not present

## 2018-01-08 DIAGNOSIS — Z1211 Encounter for screening for malignant neoplasm of colon: Secondary | ICD-10-CM | POA: Diagnosis not present

## 2018-01-08 DIAGNOSIS — D123 Benign neoplasm of transverse colon: Secondary | ICD-10-CM | POA: Diagnosis not present

## 2018-01-08 DIAGNOSIS — D179 Benign lipomatous neoplasm, unspecified: Secondary | ICD-10-CM | POA: Diagnosis not present

## 2018-01-24 DIAGNOSIS — R030 Elevated blood-pressure reading, without diagnosis of hypertension: Secondary | ICD-10-CM | POA: Diagnosis not present

## 2018-01-24 DIAGNOSIS — M25521 Pain in right elbow: Secondary | ICD-10-CM | POA: Diagnosis not present

## 2018-02-12 DIAGNOSIS — I10 Essential (primary) hypertension: Secondary | ICD-10-CM | POA: Diagnosis not present

## 2018-02-21 DIAGNOSIS — I10 Essential (primary) hypertension: Secondary | ICD-10-CM | POA: Diagnosis not present

## 2018-02-21 DIAGNOSIS — M79601 Pain in right arm: Secondary | ICD-10-CM | POA: Diagnosis not present

## 2018-02-21 DIAGNOSIS — M542 Cervicalgia: Secondary | ICD-10-CM | POA: Diagnosis not present

## 2018-02-21 DIAGNOSIS — R200 Anesthesia of skin: Secondary | ICD-10-CM | POA: Diagnosis not present

## 2018-03-12 DIAGNOSIS — M25561 Pain in right knee: Secondary | ICD-10-CM | POA: Diagnosis not present

## 2018-03-12 DIAGNOSIS — M542 Cervicalgia: Secondary | ICD-10-CM | POA: Diagnosis not present

## 2018-03-12 DIAGNOSIS — I10 Essential (primary) hypertension: Secondary | ICD-10-CM | POA: Diagnosis not present

## 2018-03-12 DIAGNOSIS — M79601 Pain in right arm: Secondary | ICD-10-CM | POA: Diagnosis not present

## 2018-03-12 DIAGNOSIS — M545 Low back pain: Secondary | ICD-10-CM | POA: Diagnosis not present

## 2018-03-22 DIAGNOSIS — M7711 Lateral epicondylitis, right elbow: Secondary | ICD-10-CM | POA: Diagnosis not present

## 2018-03-22 DIAGNOSIS — M7662 Achilles tendinitis, left leg: Secondary | ICD-10-CM | POA: Diagnosis not present

## 2018-03-22 DIAGNOSIS — M79601 Pain in right arm: Secondary | ICD-10-CM | POA: Diagnosis not present

## 2018-03-22 DIAGNOSIS — M25561 Pain in right knee: Secondary | ICD-10-CM | POA: Diagnosis not present

## 2018-03-28 DIAGNOSIS — J399 Disease of upper respiratory tract, unspecified: Secondary | ICD-10-CM | POA: Diagnosis not present

## 2018-03-28 DIAGNOSIS — J9801 Acute bronchospasm: Secondary | ICD-10-CM | POA: Diagnosis not present

## 2018-04-03 DIAGNOSIS — S86012D Strain of left Achilles tendon, subsequent encounter: Secondary | ICD-10-CM | POA: Diagnosis not present

## 2018-04-03 DIAGNOSIS — M1711 Unilateral primary osteoarthritis, right knee: Secondary | ICD-10-CM | POA: Diagnosis not present

## 2018-04-11 DIAGNOSIS — Z0000 Encounter for general adult medical examination without abnormal findings: Secondary | ICD-10-CM | POA: Diagnosis not present

## 2018-04-11 DIAGNOSIS — I10 Essential (primary) hypertension: Secondary | ICD-10-CM | POA: Diagnosis not present

## 2018-04-11 DIAGNOSIS — J9801 Acute bronchospasm: Secondary | ICD-10-CM | POA: Diagnosis not present

## 2018-04-11 DIAGNOSIS — J399 Disease of upper respiratory tract, unspecified: Secondary | ICD-10-CM | POA: Diagnosis not present

## 2018-04-12 DIAGNOSIS — M79601 Pain in right arm: Secondary | ICD-10-CM | POA: Diagnosis not present

## 2018-04-12 DIAGNOSIS — M7711 Lateral epicondylitis, right elbow: Secondary | ICD-10-CM | POA: Diagnosis not present

## 2018-04-12 DIAGNOSIS — M25511 Pain in right shoulder: Secondary | ICD-10-CM | POA: Diagnosis not present

## 2018-04-15 DIAGNOSIS — M1711 Unilateral primary osteoarthritis, right knee: Secondary | ICD-10-CM | POA: Diagnosis not present

## 2018-04-15 DIAGNOSIS — S86012D Strain of left Achilles tendon, subsequent encounter: Secondary | ICD-10-CM | POA: Diagnosis not present

## 2018-06-11 DIAGNOSIS — I10 Essential (primary) hypertension: Secondary | ICD-10-CM | POA: Diagnosis not present

## 2018-06-11 DIAGNOSIS — M545 Low back pain: Secondary | ICD-10-CM | POA: Diagnosis not present

## 2018-06-11 DIAGNOSIS — M542 Cervicalgia: Secondary | ICD-10-CM | POA: Diagnosis not present

## 2018-07-11 DIAGNOSIS — J399 Disease of upper respiratory tract, unspecified: Secondary | ICD-10-CM | POA: Diagnosis not present

## 2018-07-11 DIAGNOSIS — H6502 Acute serous otitis media, left ear: Secondary | ICD-10-CM | POA: Diagnosis not present

## 2018-07-11 DIAGNOSIS — I10 Essential (primary) hypertension: Secondary | ICD-10-CM | POA: Diagnosis not present

## 2018-07-25 ENCOUNTER — Other Ambulatory Visit (HOSPITAL_COMMUNITY)
Admission: RE | Admit: 2018-07-25 | Discharge: 2018-07-25 | Disposition: A | Discharge status: Home / Self Care | Payer: Medicare Other | Source: Ambulatory Visit | Attending: Family Medicine | Admitting: Family Medicine

## 2018-07-25 ENCOUNTER — Other Ambulatory Visit: Payer: Self-pay | Admitting: Family Medicine

## 2018-07-25 DIAGNOSIS — H6502 Acute serous otitis media, left ear: Secondary | ICD-10-CM | POA: Diagnosis not present

## 2018-07-25 DIAGNOSIS — I10 Essential (primary) hypertension: Secondary | ICD-10-CM | POA: Diagnosis not present

## 2018-07-25 DIAGNOSIS — N898 Other specified noninflammatory disorders of vagina: Secondary | ICD-10-CM | POA: Insufficient documentation

## 2018-07-25 DIAGNOSIS — J399 Disease of upper respiratory tract, unspecified: Secondary | ICD-10-CM | POA: Diagnosis not present

## 2018-07-30 LAB — URINE CYTOLOGY ANCILLARY ONLY
BACTERIAL VAGINITIS: NEGATIVE
CANDIDA VAGINITIS: POSITIVE — AB
Chlamydia: NEGATIVE
NEISSERIA GONORRHEA: NEGATIVE
TRICH (WINDOWPATH): NEGATIVE

## 2018-08-15 DIAGNOSIS — H6501 Acute serous otitis media, right ear: Secondary | ICD-10-CM | POA: Diagnosis not present

## 2018-09-19 DIAGNOSIS — M545 Low back pain: Secondary | ICD-10-CM | POA: Diagnosis not present

## 2018-09-19 DIAGNOSIS — M542 Cervicalgia: Secondary | ICD-10-CM | POA: Diagnosis not present

## 2018-09-24 ENCOUNTER — Emergency Department: Payer: Medicare Other

## 2018-09-24 ENCOUNTER — Emergency Department
Admission: EM | Admit: 2018-09-24 | Discharge: 2018-09-24 | Disposition: A | Discharge status: Home / Self Care | Payer: Medicare Other | Attending: Student in an Organized Health Care Education/Training Program | Admitting: Student in an Organized Health Care Education/Training Program

## 2018-09-24 ENCOUNTER — Encounter: Payer: Self-pay | Admitting: Emergency Medicine

## 2018-09-24 ENCOUNTER — Other Ambulatory Visit: Payer: Self-pay

## 2018-09-24 DIAGNOSIS — Z91010 Allergy to peanuts: Secondary | ICD-10-CM | POA: Diagnosis not present

## 2018-09-24 DIAGNOSIS — F17210 Nicotine dependence, cigarettes, uncomplicated: Secondary | ICD-10-CM | POA: Insufficient documentation

## 2018-09-24 DIAGNOSIS — J45909 Unspecified asthma, uncomplicated: Secondary | ICD-10-CM | POA: Insufficient documentation

## 2018-09-24 DIAGNOSIS — Z79899 Other long term (current) drug therapy: Secondary | ICD-10-CM | POA: Insufficient documentation

## 2018-09-24 DIAGNOSIS — R079 Chest pain, unspecified: Secondary | ICD-10-CM

## 2018-09-24 DIAGNOSIS — Z91040 Latex allergy status: Secondary | ICD-10-CM | POA: Diagnosis not present

## 2018-09-24 DIAGNOSIS — F419 Anxiety disorder, unspecified: Secondary | ICD-10-CM | POA: Diagnosis not present

## 2018-09-24 DIAGNOSIS — R0602 Shortness of breath: Secondary | ICD-10-CM | POA: Insufficient documentation

## 2018-09-24 LAB — CBC
HCT: 39.8 % (ref 36.0–46.0)
Hemoglobin: 12.7 g/dL (ref 12.0–15.0)
MCH: 27.8 pg (ref 26.0–34.0)
MCHC: 31.9 g/dL (ref 30.0–36.0)
MCV: 87.1 fL (ref 80.0–100.0)
Platelets: 246 10*3/uL (ref 150–400)
RBC: 4.57 MIL/uL (ref 3.87–5.11)
RDW: 12.9 % (ref 11.5–15.5)
WBC: 6.2 10*3/uL (ref 4.0–10.5)
nRBC: 0 % (ref 0.0–0.2)

## 2018-09-24 LAB — BASIC METABOLIC PANEL
Anion gap: 9 (ref 5–15)
BUN: 9 mg/dL (ref 6–20)
CO2: 22 mmol/L (ref 22–32)
Calcium: 9.2 mg/dL (ref 8.9–10.3)
Chloride: 108 mmol/L (ref 98–111)
Creatinine, Ser: 0.69 mg/dL (ref 0.44–1.00)
GFR calc Af Amer: >60 mL/min (ref >60)
GFR calc non Af Amer: >60 mL/min (ref >60)
GLUCOSE: 92 mg/dL (ref 70–99)
Potassium: 3.5 mmol/L (ref 3.5–5.1)
Sodium: 139 mmol/L (ref 135–145)

## 2018-09-24 LAB — TROPONIN I
Troponin I: <0.03 ng/mL (ref <0.03)
Troponin I: <0.03 ng/mL (ref <0.03)

## 2018-09-24 NOTE — ED Provider Notes (Signed)
[facility] Emergency Department Provider Note    First MD Initiated Contact with Patient 09/24/18 1812     (approximate)  I have reviewed the triage vital signs and the nursing notes.   HISTORY  Chief Complaint Chest Pain    HPI AMBERT VIRRUETA is a 53 y.o. female with the below listed past medical history presents to the ER for evaluation of right-sided chest wall pain associated with shortness of breath and panic feeling.  Occurred around 3:00.  Uncertain of how long it lasted.  Denied any pleuritic chest pain.  No pain radiating to her back or jaw.  Is a history of panic attacks but states this feels slightly different.  Is that she did feel diaphoretic while she was driving but is currently pain-free.  No recent fevers cough or congestion.  No nausea or vomiting.  She does smoke.    Past Medical History:  Diagnosis Date  . Anxiety   . Asthma    daily and prn inhalers  . Cervical disc disease    decreased range of motion  . Chronic pain    shoulders, neck, lower back  . Deformity, hand    left  . Depression   . Endometriosis    Lupron injection Q 3 mos.  . Fluid retention in legs   . Injury, brachial plexus    left  . Multiple allergies    takes allergy shots  . PONV (postoperative nausea and vomiting)   . 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 Wears partial dentures    upper partial   Family History  Problem Relation Age of Onset  . Cancer Mother   . Dementia Father   . Stroke Father    Past Surgical History:  Procedure Laterality Date  . ANKLE RECONSTRUCTION  02/01/2012   Procedure: RECONSTRUCTION ANKLE;  Surgeon: Wylene Simmer, MD;  Location: [facility];  Service: Orthopedics;  Laterality: Right;  Excision of right calcaneonavicular coalition with autologus fat graft interposition  . DIAGNOSTIC LAPAROSCOPY  10/02/2008   peritoneal bx.  . MULTIPLE TOOTH EXTRACTIONS     upper teeth and wisdom  teeth  . OVARIAN CYST REMOVAL  2000  . RIB RESECTION     left - cervical rib removal  . SHOULDER ARTHROSCOPY W/ ROTATOR CUFF REPAIR  07/2011   right  . SHOULDER SURGERY     left  . TOOTH EXTRACTION     x 1   Patient Active Problem List   Diagnosis Date Noted  . BV (bacterial vaginosis) 03/06/2013  . ANXIETY 11/05/2007  . DEPRESSION 11/05/2007  . NAUSEA, CHRONIC 11/05/2007  . FIBROIDS, UTERUS 03/26/2007  . TOBACCO ABUSE 03/26/2007  . MIGRAINE, COMMON 03/26/2007  . OVARIAN CYST 03/26/2007  . 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 [place] DISEASE, LUMBOSACRAL SPINE 03/26/2007  . 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      Prior to Admission medications   Medication Sig Start Date End Date Taking? Authorizing Provider  albuterol (PROVENTIL HFA;VENTOLIN HFA) 108 (90 Base) MCG/ACT inhaler Inhale 2 puffs into the lungs every 4 (four) hours as needed for wheezing or shortness of breath (cough). 11/20/16   Street, Mercedes, PA-C  beclomethasone (QVAR) 80 MCG/ACT inhaler Inhale 1 puff into the lungs 2 (two) times daily.     [provider]  cyclobenzaprine (FLEXERIL) 10 MG tablet Take 10 mg by mouth 2 (two) times daily as needed. For pain    [provider]  docusate sodium (COLACE) 100 MG capsule Take 100 mg by mouth 2 (two) times daily.    [provider]  EPINEPHrine (EPIPEN JR) 0.15 MG/0.3ML injection Inject 0.15 mg into the muscle daily as needed for anaphylaxis.     [provider]  ergocalciferol (VITAMIN D2) 50000 UNITS capsule Take 50,000 Units by mouth once a week. Sundays    [provider]  escitalopram (LEXAPRO) 20 MG tablet Take 20 mg by mouth daily.     [provider]  esomeprazole (NEXIUM) 20 MG capsule Take 20 mg by mouth daily at 12 noon.    [provider]  furosemide (LASIX) 20 MG tablet Take 10 mg by mouth as needed. For swelling    [provider]  gabapentin (NEURONTIN) 400 MG  capsule Take 400 mg by mouth 3 (three) times daily.     [provider]  HYDROcodone-acetaminophen (NORCO/VICODIN) 5-325 MG per tablet Take 1 tablet by mouth every 6 (six) hours as needed for pain. 03/31/13   Charlann Lange, PA-C  hydrocortisone 2.5 % lotion Apply topically 2 (two) times daily. 03/24/14   Julianne Rice, MD  lurasidone (LATUDA) 40 MG TABS tablet Take 40 mg by mouth daily with breakfast.    [provider]  metroNIDAZOLE (FLAGYL) 500 MG tablet Take 1 tablet (500 mg total) by mouth 2 (two) times daily. 05/05/17   Shelly Bombard, MD  nortriptyline (PAMELOR) 50 MG capsule Take 50 mg by mouth at bedtime.    [provider]  ondansetron (ZOFRAN ODT) 4 MG disintegrating tablet Take 1 tablet (4 mg total) by mouth every 8 (eight) hours as needed for nausea or vomiting. 11/20/16   Street, [place], PA-C  potassium chloride SA (K-DUR,KLOR-CON) 20 MEQ tablet Take 20 mEq by mouth 2 (two) times daily as needed (when taking lasix).     [provider]  QUEtiapine (SEROQUEL) 100 MG tablet Take 100 mg by mouth at bedtime.     [provider]  ranitidine (ZANTAC) 150 MG tablet Take 1 tablet (150 mg total) by mouth 2 (two) times daily. 11/20/16   Street, [place], PA-C  rosuvastatin (CRESTOR) 10 MG tablet Take 10 mg by mouth daily.    [provider]  valACYclovir (VALTREX) 1000 MG tablet Take 1 tablet po daily x 3 days prn for each outbreak. 08/26/15   Shelly Bombard, MD    Allergies Chocolate; Latex; Shellfish allergy; Fish-derived products; Peanut-containing drug products; and Soap    Social History Social History   Tobacco Use  . Smoking status: Current Every Day Smoker    Packs/day: 1.00    Years: 20.00    Pack years: 20.00    Types: Cigarettes  . Smokeless tobacco: Never Used  Substance Use Topics  . Alcohol use: No    Alcohol/week: 0.0 standard drinks    Comment: occasional  . Drug use: No    Review of Systems Patient  denies headaches, rhinorrhea, blurry vision, numbness, shortness of breath, chest pain, edema, cough, abdominal pain, nausea, vomiting, diarrhea, dysuria, fevers, rashes or hallucinations unless otherwise stated above in HPI. ____________________________________________   PHYSICAL EXAM:  VITAL SIGNS: Vitals:   09/24/18 1837  BP: (!) 158/76  Pulse: 73  Resp: 18  SpO2: 99%    Constitutional: Alert and oriented.  Eyes: Conjunctivae are normal.  Head: Atraumatic. Nose: No congestion/rhinnorhea. Mouth/Throat: Mucous membranes are moist.   Neck: No stridor. Painless ROM.  Cardiovascular: Normal rate, regular rhythm. Grossly normal heart sounds.  Good  peripheral circulation. Respiratory: Normal respiratory effort.  No retractions. Lungs CTAB. Gastrointestinal: Soft and nontender. No distention. No abdominal bruits. No CVA tenderness. Genitourinary:  Musculoskeletal: No lower extremity tenderness nor edema.  No joint effusions. Neurologic:  Normal speech and language. No gross focal neurologic deficits are appreciated. No facial droop Skin:  Skin is warm, dry and intact. No rash noted. Psychiatric: Mood and affect are normal. Speech and behavior are normal.  ____________________________________________   LABS (all labs ordered are listed, but only abnormal results are displayed)  Results for orders placed or performed during the hospital encounter of 09/24/18 (from the past 24 hour(s))  Basic metabolic panel     Status: None   Collection Time: 09/24/18  4:10 PM  Result Value Ref Range   Sodium 139 135 - 145 mmol/L   Potassium 3.5 3.5 - 5.1 mmol/L   Chloride 108 98 - 111 mmol/L   CO2 22 22 - 32 mmol/L   Glucose, Bld 92 70 - 99 mg/dL   BUN 9 6 - 20 mg/dL   Creatinine, Ser 0.69 0.44 - 1.00 mg/dL   Calcium 9.2 8.9 - 10.3 mg/dL   GFR calc non Af Amer >60 >60 mL/min   GFR calc Af Amer >60 >60 mL/min   Anion gap 9 5 - 15  CBC     Status: None   Collection Time: 09/24/18  4:10 PM    Result Value Ref Range   WBC 6.2 4.0 - 10.5 K/uL   RBC 4.57 3.87 - 5.11 MIL/uL   Hemoglobin 12.7 12.0 - 15.0 g/dL   HCT 39.8 36.0 - 46.0 %   MCV 87.1 80.0 - 100.0 fL   MCH 27.8 26.0 - 34.0 pg   MCHC 31.9 30.0 - 36.0 g/dL   RDW 12.9 11.5 - 15.5 %   Platelets 246 150 - 400 K/uL   nRBC 0.0 0.0 - 0.2 %  Troponin I - ONCE - STAT     Status: None   Collection Time: 09/24/18  4:10 PM  Result Value Ref Range   Troponin I <0.03 <0.03 ng/mL  Troponin I - Once-Timed     Status: None   Collection Time: 09/24/18  7:08 PM  Result Value Ref Range   Troponin I <0.03 <0.03 ng/mL   ____________________________________________  EKG My review and personal interpretation at Time: 16:05   Indication: chest pain  Rate: 90  Rhythm: sinus Axis: normal Other: normal intervals, non stemi ____________________________________________  RADIOLOGY  I personally reviewed all radiographic images ordered to evaluate for the above acute complaints and reviewed radiology reports and findings.  These findings were personally discussed with the patient.  Please see medical record for radiology report.  ____________________________________________   PROCEDURES  Procedure(s) performed:  Procedures    Critical Care performed: no ____________________________________________   INITIAL IMPRESSION / ASSESSMENT AND PLAN / ED COURSE  Pertinent labs & imaging results that were available during my care of the patient were reviewed by me and considered in my medical decision making (see chart for details).   DDX: msk strain, pleurisy, dysrhythmia, acs, angina, dissection, pe  LATRISHA COIRO is a 53 y.o. who presents to the ED with symptoms as described above.  Patient currently pain-free.  She is afebrile hemodynamically stable.  Blood work is reassuring.  EKG shows no evidence of acute ischemia initial troponin is negative.  Patient with low risk heart score.  Will order serial enzymes to further re-stratify.   Chest x-ray reassuring.  Not  clinically consistent with dissection or PE.  Clinical Course as of Sep 24 2013  Tue Sep 24, 2018  1944 Repeat troponin is negative.  Does not seem clinically consistent with cardiac etiology.  At this point I do believe she stable and appropriate for outpatient follow-up.   [PR]    Clinical Course User Index [PR] Merlyn Lot, MD     As part of my medical decision making, I reviewed the following data within the [facility] notes reviewed and incorporated, Labs reviewed, notes from prior ED visits and [place] Controlled Substance Database   ____________________________________________   FINAL CLINICAL IMPRESSION(S) / ED DIAGNOSES  Final diagnoses:  Chest pain, unspecified type      NEW MEDICATIONS STARTED DURING THIS VISIT:  New Prescriptions   No medications on file     Note:  This document was prepared using Dragon voice recognition software and may include unintentional dictation errors.    Merlyn Lot, MD 09/24/18 2015

## 2018-09-24 NOTE — ED Triage Notes (Signed)
Patient reports a tightness in the center of her chest that started this afternoon. Also complaining of sweating and shortness of breath. Denies history of similar episodes.

## 2018-10-17 ENCOUNTER — Other Ambulatory Visit (HOSPITAL_COMMUNITY)
Admission: RE | Admit: 2018-10-17 | Discharge: 2018-10-17 | Disposition: A | Discharge status: Home / Self Care | Payer: Medicare Other | Source: Ambulatory Visit | Attending: Family Medicine | Admitting: Family Medicine

## 2018-10-17 ENCOUNTER — Other Ambulatory Visit: Payer: Self-pay | Admitting: Family Medicine

## 2018-10-17 DIAGNOSIS — Z202 Contact with and (suspected) exposure to infections with a predominantly sexual mode of transmission: Secondary | ICD-10-CM | POA: Insufficient documentation

## 2018-10-17 DIAGNOSIS — M25579 Pain in unspecified ankle and joints of unspecified foot: Secondary | ICD-10-CM | POA: Diagnosis not present

## 2018-10-21 LAB — URINE CYTOLOGY ANCILLARY ONLY
Bacterial vaginitis: NEGATIVE
Candida vaginitis: NEGATIVE
Chlamydia: NEGATIVE
Neisseria Gonorrhea: NEGATIVE
Trichomonas: NEGATIVE

## 2018-11-12 ENCOUNTER — Ambulatory Visit (HOSPITAL_COMMUNITY)
Admission: EM | Admit: 2018-11-12 | Discharge: 2018-11-12 | Disposition: A | Discharge status: Home / Self Care | Payer: Medicare Other | Attending: Emergency Medicine | Admitting: Emergency Medicine

## 2018-11-12 ENCOUNTER — Encounter (HOSPITAL_COMMUNITY): Payer: Self-pay

## 2018-11-12 DIAGNOSIS — M654 Radial styloid tenosynovitis [de Quervain]: Secondary | ICD-10-CM | POA: Diagnosis not present

## 2018-11-12 DIAGNOSIS — M65831 Other synovitis and tenosynovitis, right forearm: Secondary | ICD-10-CM

## 2018-11-12 MED ORDER — METHYLPREDNISOLONE 4 MG PO TBPK: ORAL_TABLET | Freq: Every day | ORAL | 0 refills | Status: DC

## 2018-11-12 NOTE — ED Provider Notes (Signed)
HPI  SUBJECTIVE:  Wendy [name] is a right handed 53 y.o. female who presents with diffuse dull, sharp, constant right wrist pain starting today.  States it is worse along the radial side, along her thumb.  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  No trauma to the wrist, no numbness or tingling in the fingers.  No erythema, edema of the wrist.  No body aches, fevers, grip weakness.  She states that she could not move her fingers secondary to the pain.  She tried Norco which is prescribed to her for chronic pain without improvement in her symptoms.  Symptoms are worse with all movement.  She has been able to find relief in the past for this pain with cortisone shots and heating pads.  She has a past medical history of chronic pain on Neurontin and Celebrex, she is a smoker, has a history of osteoarthritis, hypertension.  No history of carpal tunnel syndrome, GI bleed, diabetes, kidney disease.  PMD: Lucianne Lei, MD  Orthopedics: Dr. Alyson Ingles.    Past Medical History:  Diagnosis Date  . Anxiety   . Asthma    daily and prn inhalers  . Cervical disc disease    decreased range of motion  . Chronic pain    shoulders, neck, lower back  . Deformity, hand    left  . Depression   . Endometriosis    Lupron injection Q 3 mos.  . Fluid retention in legs   . Injury, brachial plexus    left  . Multiple allergies    takes allergy shots  . PONV (postoperative nausea and vomiting)   . 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 Wears partial dentures    upper partial    Past Surgical History:  Procedure Laterality Date  . ANKLE RECONSTRUCTION  02/01/2012   Procedure: RECONSTRUCTION ANKLE;  Surgeon: Wylene Simmer, MD;  Location: [facility];  Service: Orthopedics;  Laterality: Right;  Excision of right calcaneonavicular coalition with autologus fat graft  interposition  . BRACHIAL PLEXUS EXPLORATION    . DIAGNOSTIC LAPAROSCOPY  10/02/2008   peritoneal bx.  . MULTIPLE TOOTH EXTRACTIONS     upper teeth and wisdom teeth  . OVARIAN CYST REMOVAL  2000  . RIB RESECTION     left - cervical rib removal  . SHOULDER ARTHROSCOPY W/ ROTATOR CUFF REPAIR  07/2011   right  . SHOULDER SURGERY     left  . TOOTH EXTRACTION     x 1    Family History  Problem Relation Age of Onset  . Cancer Mother   . Dementia Father   . Stroke Father     Social History   Tobacco Use  . Smoking status: Current Every Day Smoker    Packs/day: 1.00    Years: 20.00    Pack years: 20.00    Types: Cigarettes  . Smokeless tobacco: Never Used  Substance Use Topics  . Alcohol use: No    Alcohol/week: 0.0 standard drinks    Comment: occasional  . Drug use: No    No current facility-administered medications for this encounter.   Current Outpatient Medications:  .  albuterol (PROVENTIL HFA;VENTOLIN HFA) 108 (90 Base) MCG/ACT inhaler, Inhale 2 puffs into the lungs every 4 (four) hours as needed for wheezing or shortness of breath (cough)., Disp: 1 Inhaler, Rfl: 0 .  beclomethasone (QVAR)  80 MCG/ACT inhaler, Inhale 1 puff into the lungs 2 (two) times daily. , Disp: , Rfl:  .  cyclobenzaprine (FLEXERIL) 10 MG tablet, Take 10 mg by mouth 2 (two) times daily as needed. For pain, Disp: , Rfl:  .  docusate sodium (COLACE) 100 MG capsule, Take 100 mg by mouth 2 (two) times daily., Disp: , Rfl:  .  EPINEPHrine (EPIPEN JR) 0.15 MG/0.3ML injection, Inject 0.15 mg into the muscle daily as needed for anaphylaxis. , Disp: , Rfl:  .  ergocalciferol (VITAMIN D2) 50000 UNITS capsule, Take 50,000 Units by mouth once a week. Sundays, Disp: , Rfl:  .  escitalopram (LEXAPRO) 20 MG tablet, Take 20 mg by mouth daily. , Disp: , Rfl:  .  esomeprazole (NEXIUM) 20 MG capsule, Take 20 mg by mouth daily at 12 noon., Disp: , Rfl:  .  furosemide (LASIX) 20 MG tablet, Take 10 mg by mouth as  needed. For swelling, Disp: , Rfl:  .  gabapentin (NEURONTIN) 400 MG capsule, Take 400 mg by mouth 3 (three) times daily. , Disp: , Rfl:  .  HYDROcodone-acetaminophen (NORCO/VICODIN) 5-325 MG per tablet, Take 1 tablet by mouth every 6 (six) hours as needed for pain., Disp: 10 tablet, Rfl: 0 .  hydrocortisone 2.5 % lotion, Apply topically 2 (two) times daily., Disp: 59 mL, Rfl: 0 .  lurasidone (LATUDA) 40 MG TABS tablet, Take 40 mg by mouth daily with breakfast., Disp: , Rfl:  .  methylPREDNISolone (MEDROL DOSEPAK) 4 MG TBPK tablet, Take by mouth daily. Follow package instructions, Disp: 21 tablet, Rfl: 0 .  metroNIDAZOLE (FLAGYL) 500 MG tablet, Take 1 tablet (500 mg total) by mouth 2 (two) times daily., Disp: 14 tablet, Rfl: 2 .  nortriptyline (PAMELOR) 50 MG capsule, Take 50 mg by mouth at bedtime., Disp: , Rfl:  .  ondansetron (ZOFRAN ODT) 4 MG disintegrating tablet, Take 1 tablet (4 mg total) by mouth every 8 (eight) hours as needed for nausea or vomiting., Disp: 15 tablet, Rfl: 0 .  potassium chloride SA (K-DUR,KLOR-CON) 20 MEQ tablet, Take 20 mEq by mouth 2 (two) times daily as needed (when taking lasix). , Disp: , Rfl:  .  QUEtiapine (SEROQUEL) 100 MG tablet, Take 100 mg by mouth at bedtime. , Disp: , Rfl:  .  ranitidine (ZANTAC) 150 MG tablet, Take 1 tablet (150 mg total) by mouth 2 (two) times daily., Disp: 30 tablet, Rfl: 0 .  rosuvastatin (CRESTOR) 10 MG tablet, Take 10 mg by mouth daily., Disp: , Rfl:  .  valACYclovir (VALTREX) 1000 MG tablet, Take 1 tablet po daily x 3 days prn for each outbreak., Disp: 30 tablet, Rfl: prn  Allergies  Allergen Reactions  . Chocolate Hives  . Latex Hives, Itching and Swelling  . Shellfish Allergy   . Fish-Derived Products Nausea And Vomiting  . Peanut-Containing Drug Products Hives  . Soap Rash     ROS  As noted in HPI.   Physical Exam  BP 117/69 (BP Location: Right Arm)   Pulse 81   Temp 98.5 F (36.9 C) (Oral)   Resp 17   LMP  07/12/2016 (Approximate) Comment: 2-3 day cycle  SpO2 100%   Constitutional: Well developed, well nourished, no acute distress Eyes:  EOMI, conjunctiva normal bilaterally HENT: Normocephalic, atraumatic,mucus membranes moist Respiratory: Normal inspiratory effort Cardiovascular: Normal rate GI: nondistended skin: No rash, skin intact Musculoskeletal: R wrist appears normal.  No erythema, bruising, swelling.  RP 2+.  Positive tenderness over the EPB  and along the tendon sheath.  Distal radius NT, distal ulnar styloid NT, snuffbox NT, carpals NT , metacarpals NT, digits NT, TFCC NT.  Mild Pain with supination, mild pain  with pronation, pain  ulnar deviation. no pain with radial deviation.  Motor intact ability to flex / extend digits of affected hand, Sensation LT to hand normal. Tinel  neg. Phalen neg.  [place] Pos . Elbow and proximal forearm NT Neurologic: Alert & oriented x 3, no focal neuro deficits Psychiatric: Speech and behavior appropriate   ED Course   Medications - No data to display  Orders Placed This Encounter  Procedures  . Thumb spica    Standing Status:   Standing    Number of Occurrences:   1    Order Specific Question:   Laterality    Answer:   Right    No results found for this or any previous visit (from the past 24 hour(s)). No results found.  ED Clinical Impression  Tenosynovitis, Alfonse Ras   ED Assessment/Plan  [place] Narcotic database reviewed for this patient.  Patient has had narcotic prescriptions from a single provider for the past 2 years.  Last filled [facility] on 2/28.  Presentation consistent with de Quervain's tenosynovitis of the right wrist.  No Evidence of septic joint.  Will place in a thumb spica splint, she is to continue Celebrex, will add Tylenol 1 g 4 times a day or she can take Norco.  Discussed with patient to not take both Tylenol and Norco, not to exceed 4 g of Tylenol in 1 day.  We will send her home with a Medrol Dosepak.   Ice, heat, whichever feels better.  Follow-up with her orthopedic surgeon if not better in a week.  Patient has no history of trauma to the wrist, deferring imaging today.  Discussed  MDM, treatment plan, and plan for follow-up with patient. patient agrees with plan.   Meds ordered this encounter  Medications  . methylPREDNISolone (MEDROL DOSEPAK) 4 MG TBPK tablet    Sig: Take by mouth daily. Follow package instructions    Dispense:  21 tablet    Refill:  0    *This clinic note was created using Dragon dictation software. Therefore, there may be occasional mistakes despite careful proofreading.   ?   Melynda Ripple, MD 11/13/18 [phone]

## 2018-11-12 NOTE — Discharge Instructions (Addendum)
continue taking the Celebrex.  You can take 1 g of Tylenol up to 4 times a day for mild to moderate pain, or you can take the Norco.  Do not take the Tylenol and Norco as we discussed.  Do not take a Tylenol containing product more than 4 times a day-more than 4 g in 1 day because too much Tylenol can hurt your liver.  Wear the brace at all times.  Ice or heat, whatever feels better.  Finish the steroids unless a healthcare provider tells you to stop.  Follow-up with Dr. Alyson Ingles if not better in a week.

## 2018-11-12 NOTE — ED Triage Notes (Signed)
Pt presents with chronic right wrist pain that she states is unrelieved with prescribed pain medication.

## 2018-11-18 DIAGNOSIS — I10 Essential (primary) hypertension: Secondary | ICD-10-CM | POA: Diagnosis not present

## 2018-11-18 DIAGNOSIS — E785 Hyperlipidemia, unspecified: Secondary | ICD-10-CM | POA: Diagnosis not present

## 2018-12-09 DIAGNOSIS — I10 Essential (primary) hypertension: Secondary | ICD-10-CM | POA: Diagnosis not present

## 2018-12-09 DIAGNOSIS — E785 Hyperlipidemia, unspecified: Secondary | ICD-10-CM | POA: Diagnosis not present

## 2018-12-09 DIAGNOSIS — Z0000 Encounter for general adult medical examination without abnormal findings: Secondary | ICD-10-CM | POA: Diagnosis not present

## 2018-12-13 DIAGNOSIS — M25572 Pain in left ankle and joints of left foot: Secondary | ICD-10-CM | POA: Diagnosis not present

## 2018-12-13 DIAGNOSIS — M65871 Other synovitis and tenosynovitis, right ankle and foot: Secondary | ICD-10-CM | POA: Diagnosis not present

## 2018-12-13 DIAGNOSIS — M79671 Pain in right foot: Secondary | ICD-10-CM | POA: Diagnosis not present

## 2018-12-13 DIAGNOSIS — M25571 Pain in right ankle and joints of right foot: Secondary | ICD-10-CM | POA: Diagnosis not present

## 2018-12-13 DIAGNOSIS — M65872 Other synovitis and tenosynovitis, left ankle and foot: Secondary | ICD-10-CM | POA: Diagnosis not present

## 2018-12-19 DIAGNOSIS — M545 Low back pain: Secondary | ICD-10-CM | POA: Diagnosis not present

## 2018-12-19 DIAGNOSIS — M542 Cervicalgia: Secondary | ICD-10-CM | POA: Diagnosis not present

## 2018-12-26 DIAGNOSIS — M65872 Other synovitis and tenosynovitis, left ankle and foot: Secondary | ICD-10-CM | POA: Diagnosis not present

## 2018-12-26 DIAGNOSIS — M25571 Pain in right ankle and joints of right foot: Secondary | ICD-10-CM | POA: Diagnosis not present

## 2018-12-26 DIAGNOSIS — M65871 Other synovitis and tenosynovitis, right ankle and foot: Secondary | ICD-10-CM | POA: Diagnosis not present

## 2019-01-07 ENCOUNTER — Other Ambulatory Visit (HOSPITAL_COMMUNITY)
Admission: RE | Admit: 2019-01-07 | Discharge: 2019-01-07 | Disposition: A | Discharge status: Home / Self Care | Payer: Medicare Other | Source: Ambulatory Visit | Attending: Family Medicine | Admitting: Family Medicine

## 2019-01-07 ENCOUNTER — Other Ambulatory Visit: Payer: Self-pay | Admitting: Family Medicine

## 2019-01-07 DIAGNOSIS — N898 Other specified noninflammatory disorders of vagina: Secondary | ICD-10-CM | POA: Insufficient documentation

## 2019-01-07 DIAGNOSIS — I10 Essential (primary) hypertension: Secondary | ICD-10-CM | POA: Diagnosis not present

## 2019-01-10 LAB — URINE CYTOLOGY ANCILLARY ONLY
Bacterial vaginitis: NEGATIVE
Candida vaginitis: NEGATIVE
Chlamydia: NEGATIVE
Neisseria Gonorrhea: NEGATIVE
Trichomonas: NEGATIVE

## 2019-01-30 DIAGNOSIS — Z803 Family history of malignant neoplasm of breast: Secondary | ICD-10-CM | POA: Diagnosis not present

## 2019-01-30 DIAGNOSIS — Z1231 Encounter for screening mammogram for malignant neoplasm of breast: Secondary | ICD-10-CM | POA: Diagnosis not present

## 2019-02-18 ENCOUNTER — Emergency Department (HOSPITAL_COMMUNITY)
Admission: EM | Admit: 2019-02-18 | Discharge: 2019-02-18 | Disposition: A | Discharge status: Home / Self Care | Payer: Medicare Other | Attending: Emergency Medicine | Admitting: Emergency Medicine

## 2019-02-18 ENCOUNTER — Emergency Department (HOSPITAL_COMMUNITY): Payer: Medicare Other

## 2019-02-18 ENCOUNTER — Other Ambulatory Visit: Payer: Self-pay

## 2019-02-18 ENCOUNTER — Ambulatory Visit (INDEPENDENT_AMBULATORY_CARE_PROVIDER_SITE_OTHER)
Admission: EM | Admit: 2019-02-18 | Discharge: 2019-02-18 | Disposition: A | Discharge status: Other Acute Inpatient Hospital | Payer: Medicare Other | Source: Home / Self Care | Attending: Internal Medicine | Admitting: Internal Medicine

## 2019-02-18 ENCOUNTER — Encounter (HOSPITAL_COMMUNITY): Payer: Self-pay

## 2019-02-18 ENCOUNTER — Encounter (HOSPITAL_COMMUNITY): Payer: Self-pay | Admitting: Emergency Medicine

## 2019-02-18 DIAGNOSIS — J45909 Unspecified asthma, uncomplicated: Secondary | ICD-10-CM | POA: Diagnosis not present

## 2019-02-18 DIAGNOSIS — R1083 Colic: Secondary | ICD-10-CM | POA: Diagnosis not present

## 2019-02-18 DIAGNOSIS — R1011 Right upper quadrant pain: Secondary | ICD-10-CM | POA: Diagnosis not present

## 2019-02-18 DIAGNOSIS — Z79899 Other long term (current) drug therapy: Secondary | ICD-10-CM | POA: Insufficient documentation

## 2019-02-18 DIAGNOSIS — Z91040 Latex allergy status: Secondary | ICD-10-CM | POA: Insufficient documentation

## 2019-02-18 DIAGNOSIS — I700 Atherosclerosis of aorta: Secondary | ICD-10-CM | POA: Diagnosis not present

## 2019-02-18 DIAGNOSIS — M549 Dorsalgia, unspecified: Secondary | ICD-10-CM | POA: Diagnosis not present

## 2019-02-18 DIAGNOSIS — F17210 Nicotine dependence, cigarettes, uncomplicated: Secondary | ICD-10-CM | POA: Diagnosis not present

## 2019-02-18 DIAGNOSIS — N200 Calculus of kidney: Secondary | ICD-10-CM | POA: Diagnosis not present

## 2019-02-18 DIAGNOSIS — R110 Nausea: Secondary | ICD-10-CM | POA: Insufficient documentation

## 2019-02-18 DIAGNOSIS — Z91010 Allergy to peanuts: Secondary | ICD-10-CM | POA: Insufficient documentation

## 2019-02-18 DIAGNOSIS — K8020 Calculus of gallbladder without cholecystitis without obstruction: Secondary | ICD-10-CM | POA: Diagnosis not present

## 2019-02-18 LAB — URINALYSIS, ROUTINE W REFLEX MICROSCOPIC
Bilirubin Urine: NEGATIVE
Glucose, UA: NEGATIVE mg/dL
Ketones, ur: NEGATIVE mg/dL
Leukocytes,Ua: NEGATIVE
Nitrite: NEGATIVE
Protein, ur: 30 mg/dL — AB
Specific Gravity, Urine: 1.028 (ref 1.005–1.030)
pH: 5 (ref 5.0–8.0)

## 2019-02-18 LAB — CBC
HCT: 42.8 % (ref 36.0–46.0)
Hemoglobin: 13.4 g/dL (ref 12.0–15.0)
MCH: 27.8 pg (ref 26.0–34.0)
MCHC: 31.3 g/dL (ref 30.0–36.0)
MCV: 88.8 fL (ref 80.0–100.0)
Platelets: 265 10*3/uL (ref 150–400)
RBC: 4.82 MIL/uL (ref 3.87–5.11)
RDW: 12.8 % (ref 11.5–15.5)
WBC: 7.3 10*3/uL (ref 4.0–10.5)
nRBC: 0 % (ref 0.0–0.2)

## 2019-02-18 LAB — COMPREHENSIVE METABOLIC PANEL
ALT: 11 U/L (ref 0–44)
AST: 15 U/L (ref 15–41)
Albumin: 4.1 g/dL (ref 3.5–5.0)
Alkaline Phosphatase: 63 U/L (ref 38–126)
Anion gap: 10 (ref 5–15)
BUN: 8 mg/dL (ref 6–20)
CO2: 24 mmol/L (ref 22–32)
Calcium: 9.5 mg/dL (ref 8.9–10.3)
Chloride: 106 mmol/L (ref 98–111)
Creatinine, Ser: 0.74 mg/dL (ref 0.44–1.00)
GFR calc Af Amer: >60 mL/min (ref >60)
GFR calc non Af Amer: >60 mL/min (ref >60)
Glucose, Bld: 97 mg/dL (ref 70–99)
Potassium: 3.3 mmol/L — ABNORMAL LOW (ref 3.5–5.1)
Sodium: 140 mmol/L (ref 135–145)
Total Bilirubin: 0.9 mg/dL (ref 0.3–1.2)
Total Protein: 7.4 g/dL (ref 6.5–8.1)

## 2019-02-18 LAB — LIPASE, BLOOD: Lipase: 37 U/L (ref 11–51)

## 2019-02-18 MED ORDER — SODIUM CHLORIDE 0.9% FLUSH: 3.0000 mL | Freq: Once | INTRAVENOUS | Status: DC

## 2019-02-18 MED ORDER — SUCRALFATE 1 GM/10ML PO SUSP: 1.0000 g | Freq: Three times a day (TID) | ORAL | 0 refills | Status: DC

## 2019-02-18 MED ORDER — HYDROCODONE-ACETAMINOPHEN 5-325 MG PO TABS
1.0000 | ORAL_TABLET | Freq: Once | ORAL | Status: AC
  Administered 2019-02-18: 1 via ORAL
  Filled 2019-02-18: qty 1

## 2019-02-18 MED ORDER — ONDANSETRON 4 MG PO TBDP
4.0000 mg | ORAL_TABLET | Freq: Once | ORAL | Status: AC
  Administered 2019-02-18: 4 mg via ORAL
  Filled 2019-02-18: qty 1

## 2019-02-18 NOTE — ED Provider Notes (Signed)
[facility]    CSN: [id number] Arrival date & time: 02/18/19  1709     History   Chief Complaint Chief Complaint  Patient presents with  . Abdominal Pain    HPI Wendy [name] is a 53 y.o. female comes to urgent care with 1 week history of right upper quadrant abdominal pain that radiates to the right flank area.  Symptoms started about a week ago and is gotten progressively worse.  Pain is currently 7 out of 10.  Is aggravated by eating.  No known relieving factors.  Pain is currently constant.  Patient tried activity and gas relief with no improvement in his symptoms.  Patient had some nausea but no vomiting.  No yellowing of the eyes.  Patient says that the pain is dull and constant.  No fever or chills.  Bowel movements are normal.   Trauma to the abdomen.    Past Medical History:  Diagnosis Date  . Anxiety   . Asthma    daily and prn inhalers  . Cervical disc disease    decreased range of motion  . Chronic pain    shoulders, neck, lower back  . Deformity, hand    left  . Depression   . Endometriosis    Lupron injection Q 3 mos.  . Fluid retention in legs   . Injury, brachial plexus    left  . Multiple allergies    takes allergy shots  . PONV (postoperative nausea and vomiting)   . 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 Wears partial dentures    upper partial    Patient Active Problem List   Diagnosis Date Noted  . BV (bacterial vaginosis) 03/06/2013  . ANXIETY 11/05/2007  . DEPRESSION 11/05/2007  . NAUSEA, CHRONIC 11/05/2007  . FIBROIDS, UTERUS 03/26/2007  . TOBACCO ABUSE 03/26/2007  . MIGRAINE, COMMON 03/26/2007  . OVARIAN CYST 03/26/2007  . 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 [place] DISEASE, LUMBOSACRAL SPINE 03/26/2007  . 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    Past Surgical History:  Procedure Laterality Date  . ANKLE RECONSTRUCTION  02/01/2012   Procedure: RECONSTRUCTION ANKLE;  Surgeon:  Wylene Simmer, MD;  Location: [facility];  Service: Orthopedics;  Laterality: Right;  Excision of right calcaneonavicular coalition with autologus fat graft interposition  . BRACHIAL PLEXUS EXPLORATION    . DIAGNOSTIC LAPAROSCOPY  10/02/2008   peritoneal bx.  . MULTIPLE TOOTH EXTRACTIONS     upper teeth and wisdom teeth  . OVARIAN CYST REMOVAL  2000  . RIB RESECTION     left - cervical rib removal  . SHOULDER ARTHROSCOPY W/ ROTATOR CUFF REPAIR  07/2011   right  . SHOULDER SURGERY     left  . TOOTH EXTRACTION     x 1    OB History    Gravida  0   Para  0   Term  0   Preterm  0   AB  0   Living  0     SAB  0   TAB  0   Ectopic  0   Multiple  0   Live Births               Home Medications    Prior to Admission medications   Medication Sig Start Date End Date Taking? Authorizing Provider  albuterol (PROVENTIL HFA;VENTOLIN HFA) 108 (90 Base) MCG/ACT inhaler Inhale 2 puffs into the lungs every 4 (four) hours as  needed for wheezing or shortness of breath (cough). 11/20/16   Street, Mercedes, PA-C  beclomethasone (QVAR) 80 MCG/ACT inhaler Inhale 1 puff into the lungs 2 (two) times daily.     [provider]  cyclobenzaprine (FLEXERIL) 10 MG tablet Take 10 mg by mouth 2 (two) times daily as needed. For pain    [provider]  docusate sodium (COLACE) 100 MG capsule Take 100 mg by mouth 2 (two) times daily.    [provider]  EPINEPHrine (EPIPEN JR) 0.15 MG/0.3ML injection Inject 0.15 mg into the muscle daily as needed for anaphylaxis.     [provider]  ergocalciferol (VITAMIN D2) 50000 UNITS capsule Take 50,000 Units by mouth once a week. Sundays    [provider]  escitalopram (LEXAPRO) 20 MG tablet Take 20 mg by mouth daily.     [provider]  esomeprazole (NEXIUM) 20 MG capsule Take 20 mg by mouth daily at 12 noon.    [provider]  furosemide (LASIX) 20 MG tablet Take 10 mg by  mouth as needed. For swelling    [provider]  gabapentin (NEURONTIN) 400 MG capsule Take 400 mg by mouth 3 (three) times daily.     [provider]  HYDROcodone-acetaminophen (NORCO/VICODIN) 5-325 MG per tablet Take 1 tablet by mouth every 6 (six) hours as needed for pain. 03/31/13   Charlann Lange, PA-C  hydrocortisone 2.5 % lotion Apply topically 2 (two) times daily. 03/24/14   Julianne Rice, MD  lurasidone (LATUDA) 40 MG TABS tablet Take 40 mg by mouth daily with breakfast.    [provider]  methylPREDNISolone (MEDROL DOSEPAK) 4 MG TBPK tablet Take by mouth daily. Follow package instructions 11/12/18   Melynda Ripple, MD  metroNIDAZOLE (FLAGYL) 500 MG tablet Take 1 tablet (500 mg total) by mouth 2 (two) times daily. 05/05/17   Shelly Bombard, MD  nortriptyline (PAMELOR) 50 MG capsule Take 50 mg by mouth at bedtime.    [provider]  ondansetron (ZOFRAN ODT) 4 MG disintegrating tablet Take 1 tablet (4 mg total) by mouth every 8 (eight) hours as needed for nausea or vomiting. 11/20/16   Street, [place], PA-C  potassium chloride SA (K-DUR,KLOR-CON) 20 MEQ tablet Take 20 mEq by mouth 2 (two) times daily as needed (when taking lasix).     [provider]  QUEtiapine (SEROQUEL) 100 MG tablet Take 100 mg by mouth at bedtime.     [provider]  ranitidine (ZANTAC) 150 MG tablet Take 1 tablet (150 mg total) by mouth 2 (two) times daily. 11/20/16   Street, [place], PA-C  rosuvastatin (CRESTOR) 10 MG tablet Take 10 mg by mouth daily.    [provider]  valACYclovir (VALTREX) 1000 MG tablet Take 1 tablet po daily x 3 days prn for each outbreak. 08/26/15   Shelly Bombard, MD    Family History Family History  Problem Relation Age of Onset  . Cancer Mother   . Dementia Father   . Stroke Father     Social History Social History   Tobacco Use  . Smoking status: Current Every Day Smoker    Packs/day: 1.00    Years: 20.00     Pack years: 20.00    Types: Cigarettes  . Smokeless tobacco: Never Used  Substance Use Topics  . Alcohol use: No    Alcohol/week: 0.0 standard drinks    Comment: occasional  . Drug use: No     Allergies  Chocolate; Latex; Shellfish allergy; Fish-derived products; Peanut-containing drug products; and Soap   Review of Systems Review of Systems  Constitutional: Negative for activity change, appetite change, chills, fatigue and fever.  HENT: Negative.   Eyes: Negative.   Gastrointestinal: Positive for abdominal pain and nausea. Negative for diarrhea and vomiting.  Endocrine: Negative.   Genitourinary: Positive for flank pain. Negative for decreased urine volume, dysuria, frequency, hematuria and urgency.  Skin: Negative for pallor, rash and wound.  Neurological: Negative for dizziness, tremors and headaches.  All other systems reviewed and are negative.    Physical Exam Triage Vital Signs ED Triage Vitals  Enc Vitals Group     BP 02/18/19 1751 139/84     Pulse --      Resp 02/18/19 1751 17     Temp 02/18/19 1751 98.3 F (36.8 C)     Temp Source 02/18/19 1751 Oral     SpO2 02/18/19 1751 100 %     Weight --      Height --      Head Circumference --      Peak Flow --      Pain Score 02/18/19 1747 10     Pain Loc --      Pain Edu? --      Excl. in [place]? --    No data found.  Updated Vital Signs BP 139/84 (BP Location: Right Arm)   Temp 98.3 F (36.8 C) (Oral)   Resp 17   LMP 07/12/2016 (Approximate) Comment: 2-3 day cycle  SpO2 100%   Visual Acuity Right Eye Distance:   Left Eye Distance:   Bilateral Distance:    Right Eye Near:   Left Eye Near:    Bilateral Near:     Physical Exam Constitutional:      General: She is in acute distress.     Appearance: She is ill-appearing. She is not toxic-appearing.  Cardiovascular:     Rate and Rhythm: Normal rate and regular rhythm.     Heart sounds: Normal heart sounds.  Pulmonary:     Effort: Pulmonary  effort is normal.     Breath sounds: Normal breath sounds.  Abdominal:     General: Abdomen is protuberant. There is no distension. There are no signs of injury.     Palpations: Abdomen is soft.     Tenderness: There is abdominal tenderness in the right upper quadrant. There is no right CVA tenderness, guarding or rebound. Positive signs include Murphy's sign. Negative signs include Rovsing's sign, McBurney's sign, psoas sign and obturator sign.     Hernia: No hernia is present.  Skin:    General: Skin is warm.     Capillary Refill: Capillary refill takes less than 2 seconds.     Findings: No erythema or rash.  Neurological:     General: No focal deficit present.     Mental Status: She is alert.      UC Treatments / Results  Labs (all labs ordered are listed, but only abnormal results are displayed) Labs Reviewed  COMPREHENSIVE METABOLIC PANEL  CBC    EKG None  Radiology No results found.  Procedures Procedures (including critical care time)  Medications Ordered in UC Medications - No data to display  Initial Impression / Assessment and Plan / UC Course  I have reviewed the triage vital signs and the nursing notes.  Pertinent labs & imaging results that were available during my care of the patient were reviewed by me and  considered in my medical decision making (see chart for details).     1.  Right upper quadrant abdominal pain suspected gall bladder disease: Patient will need abdominal imaging to evaluate gallbladder I suspect patient has gallstone disease  Patient is advised to go to emergency department for imaging and further management. She continues to have pain at this time with no improvement in her pain. Final Clinical Impressions(s) / UC Diagnoses   Final diagnoses:  Abdominal pain, right upper quadrant   Discharge Instructions   None    ED Prescriptions    None     Controlled Substance Prescriptions [place] Controlled Substance Registry  consulted? No   Chase Picket, MD 02/18/19 [phone]

## 2019-02-18 NOTE — ED Notes (Signed)
Patient transported to Ultrasound 

## 2019-02-18 NOTE — ED Triage Notes (Signed)
Pt states she has been having right sided abd pain for a week. THe pain goes into her right back. Pt states she has been nauseated and vomited a little. Denies urinary symptoms.

## 2019-02-18 NOTE — ED Provider Notes (Signed)
[facility]ERGENCY DEPARTMENT Provider Note   CSN: [id number] Arrival date & time: 02/18/19  1831    History   Chief Complaint Chief Complaint  Patient presents with   Abdominal Pain    HPI Wendy [name] is a 53 y.o. female presenting for evaluation abdominal pain.  Patient states over the past week, she has been having intermittent but steadily worsening right upper quadrant abdominal pain.  Pain radiates to her back.  It is worse after eating and worse when she lays flat.  She describes a fullness after eating, which he felt was gas but improved with pain medication, [facility] patient was not gas.  She reports mild nausea, but no vomiting.  She denies fevers, chills, sore throat, cough, chest pain, shortness breath, urinary symptoms, abnormal bowel movements.  She denies dark, tarry, or red stools.  She reports a history of endometriosis for which she has had an ex lap, but no other abdominal surgeries. No h/o kidney stones. No change in pain with BM or urination.  Additional history obtained from chart review, patient has a history of fibroids, anxiety, ovarian cyst, degenerative disc disease, chronic pain.     HPI  Past Medical History:  Diagnosis Date   Anxiety    Asthma    daily and prn inhalers   Cervical disc disease    decreased range of motion   Chronic pain    shoulders, neck, lower back   Deformity, hand    left   Depression    Endometriosis    Lupron injection Q 3 mos.   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   Wears partial dentures    upper partial    Patient Active Problem List   Diagnosis Date Noted   BV (bacterial vaginosis) 03/06/2013   ANXIETY 11/05/2007   DEPRESSION 11/05/2007   NAUSEA, CHRONIC 11/05/2007   FIBROIDS, UTERUS  03/26/2007   TOBACCO ABUSE 03/26/2007   MIGRAINE, COMMON 03/26/2007   OVARIAN CYST 03/26/2007   DEGENERATIVE DISC DISEASE, CERVICAL SPINE 03/26/2007   DEGENERATIVE DISC DISEASE, LUMBOSACRAL SPINE 03/26/2007   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   MULTIPLE TOOTH EXTRACTIONS     upper teeth and wisdom teeth   OVARIAN CYST REMOVAL  2000   RIB RESECTION     left - cervical rib removal   SHOULDER ARTHROSCOPY W/ ROTATOR CUFF REPAIR  07/2011   right   SHOULDER SURGERY     left   TOOTH EXTRACTION     x 1     OB History    Gravida  0   Para  0   Term  0   Preterm  0   AB  0   Living  0     SAB  0   TAB  0   Ectopic  0   Multiple  0   Live Births  Home Medications    Prior to Admission medications   Medication Sig Start Date End Date Taking? Authorizing Provider  albuterol (PROVENTIL HFA;VENTOLIN HFA) 108 (90 Base) MCG/ACT inhaler Inhale 2 puffs into the lungs every 4 (four) hours as needed for wheezing or shortness of breath (cough). 11/20/16   Street, Mercedes, PA-C  beclomethasone (QVAR) 80 MCG/ACT inhaler Inhale 1 puff into the lungs 2 (two) times daily.     [provider]  cyclobenzaprine (FLEXERIL) 10 MG tablet Take 10 mg by mouth 2 (two) times daily as needed. For pain    [provider]  docusate sodium (COLACE) 100 MG capsule Take 100 mg by mouth 2 (two) times daily.    [provider]  EPINEPHrine (EPIPEN JR) 0.15 MG/0.3ML injection Inject 0.15 mg into the muscle daily as needed for anaphylaxis.     [provider]  ergocalciferol (VITAMIN D2) 50000 UNITS capsule Take 50,000 Units by mouth once a week. Sundays    [provider]  escitalopram (LEXAPRO) 20 MG tablet Take 20 mg by mouth daily.     [provider]  esomeprazole (NEXIUM) 20 MG capsule Take 20 mg by mouth daily at 12 noon.    [provider]  furosemide (LASIX) 20 MG tablet Take 10 mg by mouth as needed. For swelling    [provider]  gabapentin (NEURONTIN) 400 MG capsule Take 400 mg by mouth 3 (three) times daily.     [provider]  HYDROcodone-acetaminophen (NORCO/VICODIN) 5-325 MG per tablet Take 1 tablet by mouth every 6 (six) hours as needed for pain. 03/31/13   Charlann Lange, PA-C  hydrocortisone 2.5 % lotion Apply topically 2 (two) times daily. 03/24/14   Julianne Rice, MD  lurasidone (LATUDA) 40 MG TABS tablet Take 40 mg by mouth daily with breakfast.    [provider]  methylPREDNISolone (MEDROL DOSEPAK) 4 MG TBPK tablet Take by mouth daily. Follow package instructions 11/12/18   Melynda Ripple, MD  metroNIDAZOLE (FLAGYL) 500 MG tablet Take 1 tablet (500 mg total) by mouth 2 (two) times daily. 05/05/17   Shelly Bombard, MD  nortriptyline (PAMELOR) 50 MG capsule Take 50 mg by mouth at bedtime.    [provider]  ondansetron (ZOFRAN ODT) 4 MG disintegrating tablet Take 1 tablet (4 mg total) by mouth every 8 (eight) hours as needed for nausea or vomiting. 11/20/16   Street, [place], PA-C  potassium chloride SA (K-DUR,KLOR-CON) 20 MEQ tablet Take 20 mEq by mouth 2 (two) times daily as needed (when taking lasix).     [provider]  QUEtiapine (SEROQUEL) 100 MG tablet Take 100 mg by mouth at bedtime.     [provider]  ranitidine (ZANTAC) 150 MG tablet Take 1 tablet (150 mg total) by mouth 2 (two) times daily. 11/20/16   Street, Jansen, PA-C  rosuvastatin (CRESTOR) 10 MG tablet Take 10 mg by mouth daily.    [provider]   sucralfate (CARAFATE) 1 GM/10ML suspension Take 10 mLs (1 g total) by mouth 4 (four) times daily -  with meals and at bedtime. 02/18/19   Maurice Ramseur, PA-C  valACYclovir (VALTREX) 1000 MG tablet Take 1 tablet po daily x 3 days prn for each outbreak. 08/26/15   Shelly Bombard, MD    Family History Family History  Problem Relation Age of Onset   Cancer Mother    Dementia Father    Stroke Father     Social History Social History  Tobacco Use   Smoking status: Current Every Day Smoker    Packs/day: 1.00    Years: 20.00    Pack years: 20.00    Types: Cigarettes   Smokeless tobacco: Never Used  Substance Use Topics   Alcohol use: No    Alcohol/week: 0.0 standard drinks    Comment: occasional   Drug use: No     Allergies   Chocolate; Latex; Shellfish allergy; Fish-derived products; Peanut-containing drug products; and Soap   Review of Systems Review of Systems  Gastrointestinal: Positive for abdominal pain and nausea.  All other systems reviewed and are negative.    Physical Exam Updated Vital Signs BP (!) 134/58    Pulse 71    Temp 98.2 F (36.8 C) (Oral)    Resp 18    Ht 4\' 11"  (1.499 m)    Wt 86.2 kg    LMP 07/12/2016 (Approximate) Comment: 2-3 day cycle   SpO2 99%    BMI 38.38 kg/m   Physical Exam Vitals signs and nursing note reviewed.  Constitutional:      General: She is not in acute distress.    Appearance: She is well-developed.     Comments: obses F who appears in nad  HENT:     Head: Normocephalic and atraumatic.  Eyes:     Conjunctiva/sclera: Conjunctivae normal.     Pupils: Pupils are equal, round, and reactive to light.  Neck:     Musculoskeletal: Normal range of motion and neck supple.  Cardiovascular:     Rate and Rhythm: Normal rate and regular rhythm.     Pulses: Normal pulses.  Pulmonary:     Effort: Pulmonary effort is normal. No respiratory distress.     Breath sounds: Normal breath sounds. No wheezing.  Abdominal:      General: There is no distension.     Palpations: Abdomen is soft.     Tenderness: There is abdominal tenderness in the right upper quadrant and epigastric area. There is right CVA tenderness. Positive signs include Murphy's sign.     Comments: ttp of ruq, epigastric and and R flank. + muphys sign. No ttp elsewhere in the abd, soft without rigidity, guarding, or distention, negative rebound.   Musculoskeletal: Normal range of motion.  Skin:    General: Skin is warm and dry.     Capillary Refill: Capillary refill takes less than 2 seconds.  Neurological:     Mental Status: She is alert and oriented to person, place, and time.      ED Treatments / Results  Labs (all labs ordered are listed, but only abnormal results are displayed) Labs Reviewed  COMPREHENSIVE METABOLIC PANEL - Abnormal; Notable for the following components:      Result Value   Potassium 3.3 (*)    All other components within normal limits  URINALYSIS, ROUTINE W REFLEX MICROSCOPIC - Abnormal; Notable for the following components:   Color, Urine AMBER (*)    APPearance HAZY (*)    Hgb urine dipstick SMALL (*)    Protein, ur 30 (*)    Bacteria, UA RARE (*)    All other components within normal limits  LIPASE, BLOOD  CBC    EKG None  Radiology Ct Renal Stone Study  Result Date: 02/18/2019 CLINICAL DATA:  Right lower quadrant abdominal pain. EXAM: CT ABDOMEN AND PELVIS WITHOUT CONTRAST TECHNIQUE: Multidetector CT imaging of the abdomen and pelvis was performed following the standard protocol without IV contrast. COMPARISON:  CT scan of June 04, 2013. FINDINGS: Lower chest: No acute abnormality. Hepatobiliary: No focal liver abnormality is seen. No gallstones, gallbladder wall thickening, or biliary dilatation. Pancreas: Unremarkable. No pancreatic ductal dilatation or surrounding inflammatory changes. Spleen: Normal in size without focal abnormality. Adrenals/Urinary Tract: Adrenal glands appear normal. Small  nonobstructive left renal calculus is noted. No hydronephrosis or renal obstruction is noted. Urinary bladder is unremarkable. Stomach/Bowel: Stomach is within normal limits. Appendix appears normal. No evidence of bowel wall thickening, distention, or inflammatory changes. Vascular/Lymphatic: Aortic atherosclerosis. No enlarged abdominal or pelvic lymph nodes. Reproductive: Uterus and bilateral adnexa are unremarkable. Other: No abdominal wall hernia or abnormality. No abdominopelvic ascites. Musculoskeletal: No acute or significant osseous findings. IMPRESSION: Small nonobstructive left renal calculus. No hydronephrosis or renal obstruction is noted. No other acute abnormality seen in the abdomen or pelvis. Aortic Atherosclerosis (ICD10-I70.0). Electronically Signed   By: Marijo Conception M.D.   On: 02/18/2019 21:57   US Abdomen Limited Ruq  Result Date: 02/18/2019 CLINICAL DATA:  Right upper quadrant abdominal pain EXAM: ULTRASOUND ABDOMEN LIMITED RIGHT UPPER QUADRANT COMPARISON:  None. FINDINGS: Gallbladder: No gallstones or wall thickening visualized. No sonographic Murphy sign noted by sonographer. Common bile duct: Diameter: 0.2 cm Liver: No focal lesion identified. Within normal limits in parenchymal echogenicity. Portal vein is patent on color Doppler imaging with normal direction of blood flow towards the liver. IMPRESSION: Normal study. Electronically Signed   By: Constance Holster M.D.   On: 02/18/2019 20:22    Procedures Procedures (including critical care time)  Medications Ordered in ED Medications  sodium chloride flush (NS) 0.9 % injection 3 mL (has no administration in time range)  HYDROcodone-acetaminophen (NORCO/VICODIN) 5-325 MG per tablet 1 tablet (1 tablet Oral Given 02/18/19 2044)  ondansetron (ZOFRAN-ODT) disintegrating tablet 4 mg (4 mg Oral Given 02/18/19 2044)     Initial Impression / Assessment and Plan / ED Course  I have reviewed the triage vital signs and the nursing  notes.  Pertinent labs & imaging results that were available during my care of the patient were reviewed by me and considered in my medical decision making (see chart for details).        Pt presenting for evaluation of abdominal pain.  Physical examination, she appears nontoxic.  She is afebrile but tachycardic.  Patient does have tenderness palpation of the right upper quadrant, positive Murphy sign.  However, she also has tenderness palpation of the right CVA/flank.  Likely gallstones, however also consider kidney stone.  Lower suspicion for appendicitis, colitis, diverticulitis, or perf. Will order labs and [place].   Labs reassuring, no leukocytosis. Liver, pancreatic, and kidney fnx reassuring. [place] pending.   [place] negative for stones, sludge or cholecystitis. UA with small blood, will order ct renal to r/o kidney stone and for further eval of the abd.   CT shows stone in L kidney, but no ureteral stones. No explanation for pt's ruq pain. On reassessment, pt states pain and nausea improved with medication. Consider gastritis vs pud vs gerd, as sxs worse at night and after eating. Will trial carafate and have pt f/u with pcp for further eval. At this time, pt appears safe for d/c. Return precautions given. Pt states she understands and agrees to plan.   Final Clinical Impressions(s) / ED Diagnoses   Final diagnoses:  RUQ pain  Colicky RUQ abdominal pain    ED Discharge Orders         Ordered    sucralfate (CARAFATE) 1 GM/10ML suspension  3 times daily with meals & bedtime     02/18/19 2236           Franchot Heidelberg, PA-C 02/18/19 2310    Dorie Rank, MD 02/20/19 [id number]

## 2019-02-18 NOTE — Discharge Instructions (Addendum)
Continue taking your home medication as prescribed. Try the Carafate before eating and before bed to decrease your symptoms. Follow-up with your primary care doctor for further evaluation of your symptoms persist. Return to the emergency room if you develop high fevers, persistent vomiting despite medication, any new, worsening, concerning symptoms.

## 2019-02-18 NOTE — ED Triage Notes (Signed)
Patient presents to Urgent Care with complaints of right sided abdominal pain that radiates to her back since a week ago. Patient reports she thought it was gas and/or BM issues, but has been eating activia and gas relief and has been having normal BMs but the pain has not gone away.

## 2019-02-18 NOTE — ED Notes (Signed)
Patient transported to CT 

## 2019-03-03 DIAGNOSIS — R1033 Periumbilical pain: Secondary | ICD-10-CM | POA: Diagnosis not present

## 2019-03-03 DIAGNOSIS — R1310 Dysphagia, unspecified: Secondary | ICD-10-CM | POA: Diagnosis not present

## 2019-03-03 DIAGNOSIS — R110 Nausea: Secondary | ICD-10-CM | POA: Diagnosis not present

## 2019-03-03 DIAGNOSIS — I10 Essential (primary) hypertension: Secondary | ICD-10-CM | POA: Diagnosis not present

## 2019-03-04 ENCOUNTER — Encounter: Payer: Self-pay | Admitting: Obstetrics

## 2019-03-06 DIAGNOSIS — B9681 Helicobacter pylori [H. pylori] as the cause of diseases classified elsewhere: Secondary | ICD-10-CM | POA: Diagnosis not present

## 2019-03-06 DIAGNOSIS — R1033 Periumbilical pain: Secondary | ICD-10-CM | POA: Diagnosis not present

## 2019-03-06 DIAGNOSIS — K2950 Unspecified chronic gastritis without bleeding: Secondary | ICD-10-CM | POA: Diagnosis not present

## 2019-03-06 DIAGNOSIS — R110 Nausea: Secondary | ICD-10-CM | POA: Diagnosis not present

## 2019-03-10 DIAGNOSIS — I10 Essential (primary) hypertension: Secondary | ICD-10-CM | POA: Diagnosis not present

## 2019-03-20 ENCOUNTER — Ambulatory Visit: Payer: Medicare Other | Admitting: Obstetrics

## 2019-03-31 ENCOUNTER — Ambulatory Visit: Payer: Medicare Other | Admitting: Obstetrics

## 2019-04-11 ENCOUNTER — Ambulatory Visit (INDEPENDENT_AMBULATORY_CARE_PROVIDER_SITE_OTHER): Payer: Medicare Other | Admitting: Obstetrics

## 2019-04-11 ENCOUNTER — Other Ambulatory Visit (HOSPITAL_COMMUNITY)
Admission: RE | Admit: 2019-04-11 | Discharge: 2019-04-11 | Disposition: A | Discharge status: Home / Self Care | Payer: Medicare Other | Source: Ambulatory Visit | Attending: Obstetrics | Admitting: Obstetrics

## 2019-04-11 ENCOUNTER — Encounter: Payer: Self-pay | Admitting: Obstetrics

## 2019-04-11 ENCOUNTER — Other Ambulatory Visit: Payer: Self-pay

## 2019-04-11 VITALS — BP 145/88 | HR 68 | Wt 189.5 lb

## 2019-04-11 DIAGNOSIS — N898 Other specified noninflammatory disorders of vagina: Secondary | ICD-10-CM | POA: Insufficient documentation

## 2019-04-11 DIAGNOSIS — Z01419 Encounter for gynecological examination (general) (routine) without abnormal findings: Secondary | ICD-10-CM

## 2019-04-11 DIAGNOSIS — M542 Cervicalgia: Secondary | ICD-10-CM | POA: Diagnosis not present

## 2019-04-11 DIAGNOSIS — M25521 Pain in right elbow: Secondary | ICD-10-CM | POA: Diagnosis not present

## 2019-04-11 DIAGNOSIS — I10 Essential (primary) hypertension: Secondary | ICD-10-CM | POA: Diagnosis not present

## 2019-04-11 DIAGNOSIS — M5136 Other intervertebral disc degeneration, lumbar region: Secondary | ICD-10-CM | POA: Diagnosis not present

## 2019-04-11 DIAGNOSIS — Z1239 Encounter for other screening for malignant neoplasm of breast: Secondary | ICD-10-CM

## 2019-04-11 DIAGNOSIS — Z113 Encounter for screening for infections with a predominantly sexual mode of transmission: Secondary | ICD-10-CM

## 2019-04-11 MED ORDER — CLOTRIMAZOLE 1 % EX OINT: 1.0000 "application " | TOPICAL_OINTMENT | Freq: Two times a day (BID) | CUTANEOUS | 2 refills | Status: DC | PRN

## 2019-04-11 NOTE — Progress Notes (Signed)
Pt is here for annual gyn exam. Last pap 08/2015 normal. Last MMG this year, normal per patient. Pt had colonoscopy and endoscopy done last year, normal per patient.

## 2019-04-11 NOTE — Progress Notes (Signed)
Subjective:        Wendy [name] is a 53 y.o. female here for a routine exam.  Current complaints: Itching under breasts and in groin area.    Personal health questionnaire:  Is [name], have a family history of breast and/or ovarian cancer: no Is there a family history of uterine cancer diagnosed at age < 33, gastrointestinal cancer, urinary tract cancer, family member who is a Field seismologist syndrome-associated carrier: no Is the patient overweight and hypertensive, family history of diabetes, personal history of gestational diabetes, preeclampsia or PCOS: no Is patient over 53, have PCOS,  family history of premature CHD under age 20, diabetes, smoke, have hypertension or peripheral artery disease:  no At any time, has a partner hit, kicked or otherwise hurt or frightened you?: no Over the past 2 weeks, have you felt down, depressed or hopeless?: no Over the past 2 weeks, have you felt little interest or pleasure in doing things?:no   Gynecologic History Patient's last menstrual period was 07/12/2016 (approximate). Contraception: post menopausal status Last Pap: 06-15-2014. Results were: normal Last mammogram: 05-21-2015. Results were: normal  Obstetric History OB History  Gravida Para Term Preterm AB Living  0 0 0 0 0 0  SAB TAB Ectopic Multiple Live Births  0 0 0 0      Past Medical History:  Diagnosis Date  . Anxiety   . Asthma    daily and prn inhalers  . Cervical disc disease    decreased range of motion  . Chronic pain    shoulders, neck, lower back  . Deformity, hand    left  . Depression   . Endometriosis    Lupron injection Q 3 mos.  . Fluid retention in legs   . Injury, brachial plexus    left  . Multiple allergies    takes allergy shots  . PONV (postoperative nausea and vomiting)   . 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 Wears partial dentures    upper partial    Past Surgical History:  Procedure Laterality  Date  . ANKLE RECONSTRUCTION  02/01/2012   Procedure: RECONSTRUCTION ANKLE;  Surgeon: Wylene Simmer, MD;  Location: [place];  Service: Orthopedics;  Laterality: Right;  Excision of right calcaneonavicular coalition with autologus fat graft interposition  . BRACHIAL PLEXUS EXPLORATION    . DIAGNOSTIC LAPAROSCOPY  10/02/2008   peritoneal bx.  . MULTIPLE TOOTH EXTRACTIONS     upper teeth and wisdom teeth  . OVARIAN CYST REMOVAL  2000  . RIB RESECTION     left - cervical rib removal  . SHOULDER ARTHROSCOPY W/ ROTATOR CUFF REPAIR  07/2011   right  . SHOULDER SURGERY     left  . TOOTH EXTRACTION     x 1     Current Outpatient Medications:  .  albuterol (PROVENTIL HFA;VENTOLIN HFA) 108 (90 Base) MCG/ACT inhaler, Inhale 2 puffs into the lungs every 4 (four) hours as needed for wheezing or shortness of breath (cough)., Disp: 1 Inhaler, Rfl: 0 .  beclomethasone (QVAR) 80 MCG/ACT inhaler, Inhale 1 puff into the lungs 2 (two) times daily. , Disp: , Rfl:  .  cyclobenzaprine (FLEXERIL) 10 MG tablet, Take 10 mg by mouth 2 (two) times daily as needed. For pain, Disp: , Rfl:  .  docusate sodium (COLACE) 100 MG capsule, Take 100 mg by mouth 2 (two) times daily., Disp: , Rfl:  .  EPINEPHrine (EPIPEN JR) 0.15 MG/0.3ML  injection, Inject 0.15 mg into the muscle daily as needed for anaphylaxis. , Disp: , Rfl:  .  ergocalciferol (VITAMIN D2) 50000 UNITS capsule, Take 50,000 Units by mouth once a week. Sundays, Disp: , Rfl:  .  escitalopram (LEXAPRO) 20 MG tablet, Take 20 mg by mouth daily. , Disp: , Rfl:  .  esomeprazole (NEXIUM) 20 MG capsule, Take 20 mg by mouth daily at 12 noon., Disp: , Rfl:  .  furosemide (LASIX) 20 MG tablet, Take 10 mg by mouth as needed. For swelling, Disp: , Rfl:  .  gabapentin (NEURONTIN) 400 MG capsule, Take 400 mg by mouth 3 (three) times daily. , Disp: , Rfl:  .  HYDROcodone-acetaminophen (NORCO/VICODIN) 5-325 MG per tablet, Take 1 tablet by mouth every 6 (six) hours  as needed for pain., Disp: 10 tablet, Rfl: 0 .  hydrocortisone 2.5 % lotion, Apply topically 2 (two) times daily., Disp: 59 mL, Rfl: 0 .  nortriptyline (PAMELOR) 50 MG capsule, Take 50 mg by mouth at bedtime., Disp: , Rfl:  .  ondansetron (ZOFRAN ODT) 4 MG disintegrating tablet, Take 1 tablet (4 mg total) by mouth every 8 (eight) hours as needed for nausea or vomiting., Disp: 15 tablet, Rfl: 0 .  potassium chloride SA (K-DUR,KLOR-CON) 20 MEQ tablet, Take 20 mEq by mouth 2 (two) times daily as needed (when taking lasix). , Disp: , Rfl:  .  valACYclovir (VALTREX) 1000 MG tablet, Take 1 tablet po daily x 3 days prn for each outbreak., Disp: 30 tablet, Rfl: prn .  Clotrimazole 1 % OINT, Apply 1 application topically 2 (two) times daily as needed., Disp: 120 g, Rfl: 2 .  lurasidone (LATUDA) 40 MG TABS tablet, Take 40 mg by mouth daily with breakfast., Disp: , Rfl:  .  methylPREDNISolone (MEDROL DOSEPAK) 4 MG TBPK tablet, Take by mouth daily. Follow package instructions (Patient not taking: Reported on 04/11/2019), Disp: 21 tablet, Rfl: 0 .  metroNIDAZOLE (FLAGYL) 500 MG tablet, Take 1 tablet (500 mg total) by mouth 2 (two) times daily. (Patient not taking: Reported on 04/11/2019), Disp: 14 tablet, Rfl: 2 .  QUEtiapine (SEROQUEL) 100 MG tablet, Take 100 mg by mouth at bedtime. , Disp: , Rfl:  .  ranitidine (ZANTAC) 150 MG tablet, Take 1 tablet (150 mg total) by mouth 2 (two) times daily. (Patient not taking: Reported on 04/11/2019), Disp: 30 tablet, Rfl: 0 .  rosuvastatin (CRESTOR) 10 MG tablet, Take 10 mg by mouth daily., Disp: , Rfl:  .  sucralfate (CARAFATE) 1 GM/10ML suspension, Take 10 mLs (1 g total) by mouth 4 (four) times daily -  with meals and at bedtime. (Patient not taking: Reported on 04/11/2019), Disp: 420 mL, Rfl: 0 Allergies  Allergen Reactions  . Chocolate Hives  . Latex Hives, Itching and Swelling  . Shellfish Allergy   . Fish-Derived Products Nausea And Vomiting  . Peanut-Containing Drug  Products Hives  . Soap Rash    Social History   Tobacco Use  . Smoking status: Current Every Day Smoker    Packs/day: 1.00    Years: 20.00    Pack years: 20.00    Types: Cigarettes  . Smokeless tobacco: Never Used  Substance Use Topics  . Alcohol use: No    Alcohol/week: 0.0 standard drinks    Comment: occasional    Family History  Problem Relation Age of Onset  . Cancer Mother   . Dementia Father   . Stroke Father       Review of  Systems  Constitutional: negative for fatigue and weight loss Respiratory: negative for cough and wheezing Cardiovascular: negative for chest pain, fatigue and palpitations Gastrointestinal: negative for abdominal pain and change in bowel habits Musculoskeletal:negative for myalgias Neurological: negative for gait problems and tremors Behavioral/Psych: negative for abusive relationship, depression Endocrine: negative for temperature intolerance    Genitourinary:negative for abnormal menstrual periods, genital lesions, hot flashes, sexual problems and vaginal discharge Integument/breast: negative for breast lump, breast tenderness, nipple discharge and skin lesion(s)    Objective:       BP (!) 145/88   Pulse 68   Wt 189 lb 8 oz (86 kg)   LMP 07/12/2016 (Approximate) Comment: 2-3 day cycle  BMI 38.27 kg/m  General:   alert  Skin:   no rash or abnormalities  Lungs:   clear to auscultation bilaterally  Heart:   regular rate and rhythm, S1, S2 normal, no murmur, click, rub or gallop  Breasts:   normal without suspicious masses, skin or nipple changes or axillary nodes  Abdomen:  normal findings: no organomegaly, soft, non-tender and no hernia  Pelvis:  External genitalia: normal general appearance Urinary system: urethral meatus normal and bladder without fullness, nontender Vaginal: normal without tenderness, induration or masses Cervix: normal appearance Adnexa: normal bimanual exam Uterus: anteverted and non-tender, normal size    Lab Review Urine pregnancy test Labs reviewed yes Radiologic studies reviewed yes  50% of 25 min visit spent on counseling and coordination of care.   Assessment:     1. Encounter for routine gynecological examination with Papanicolaou smear of cervix Rx: - Cytology - PAP( [facility])  2. Vaginal discharge Rx: - Cervicovaginal ancillary only( [place] [facility])  3. Screen for STD (sexually transmitted disease) Rx: - Hepatitis B surface antigen - Hepatitis C antibody - HIV Antibody (routine testing w rflx) - RPR  4. Screening breast examination Rx: - MM Digital Screening; Future   Plan:    Education reviewed: calcium supplements, depression evaluation, low fat, low cholesterol diet, self breast exams, smoking cessation and weight bearing exercise. Follow up in: 2 years.   Meds ordered this encounter  Medications  . Clotrimazole 1 % OINT    Sig: Apply 1 application topically 2 (two) times daily as needed.    Dispense:  120 g    Refill:  2   Orders Placed This Encounter  Procedures  . Hepatitis B surface antigen  . Hepatitis C antibody  . HIV Antibody (routine testing w rflx)  . RPR    Shelly Bombard MD 04-11-2019

## 2019-04-12 LAB — HIV ANTIBODY (ROUTINE TESTING W REFLEX): HIV Screen 4th Generation wRfx: NONREACTIVE

## 2019-04-12 LAB — RPR: RPR Ser Ql: NONREACTIVE

## 2019-04-12 LAB — HEPATITIS B SURFACE ANTIGEN: Hepatitis B Surface Ag: NEGATIVE

## 2019-04-12 LAB — HEPATITIS C ANTIBODY: Hep C Virus Ab: <0.1 s/co ratio (ref 0.0–0.9)

## 2019-04-14 LAB — CYTOLOGY - PAP
Diagnosis: NEGATIVE
HPV: NOT DETECTED

## 2019-04-21 LAB — CERVICOVAGINAL ANCILLARY ONLY
Bacterial vaginitis: NEGATIVE
Candida vaginitis: POSITIVE — AB
Chlamydia: NEGATIVE
Neisseria Gonorrhea: NEGATIVE
Trichomonas: NEGATIVE

## 2019-04-22 ENCOUNTER — Other Ambulatory Visit: Payer: Self-pay | Admitting: Obstetrics

## 2019-04-22 DIAGNOSIS — B373 Candidiasis of vulva and vagina: Secondary | ICD-10-CM

## 2019-04-22 DIAGNOSIS — B3731 Acute candidiasis of vulva and vagina: Secondary | ICD-10-CM

## 2019-04-22 MED ORDER — FLUCONAZOLE 150 MG PO TABS: 150.0000 mg | ORAL_TABLET | Freq: Once | ORAL | 2 refills | Status: AC

## 2019-05-21 ENCOUNTER — Encounter: Payer: Self-pay | Admitting: Obstetrics

## 2019-05-21 DIAGNOSIS — A048 Other specified bacterial intestinal infections: Secondary | ICD-10-CM | POA: Diagnosis not present

## 2019-05-22 DIAGNOSIS — A048 Other specified bacterial intestinal infections: Secondary | ICD-10-CM | POA: Diagnosis not present

## 2019-05-26 DIAGNOSIS — M545 Low back pain: Secondary | ICD-10-CM | POA: Diagnosis not present

## 2019-06-09 DIAGNOSIS — I10 Essential (primary) hypertension: Secondary | ICD-10-CM | POA: Diagnosis not present

## 2019-06-09 DIAGNOSIS — Z23 Encounter for immunization: Secondary | ICD-10-CM | POA: Diagnosis not present

## 2019-06-17 DIAGNOSIS — M542 Cervicalgia: Secondary | ICD-10-CM | POA: Diagnosis not present

## 2019-06-17 DIAGNOSIS — M545 Low back pain: Secondary | ICD-10-CM | POA: Diagnosis not present

## 2019-07-18 DIAGNOSIS — R42 Dizziness and giddiness: Secondary | ICD-10-CM | POA: Diagnosis not present

## 2019-07-18 DIAGNOSIS — I10 Essential (primary) hypertension: Secondary | ICD-10-CM | POA: Diagnosis not present

## 2019-11-03 ENCOUNTER — Other Ambulatory Visit: Payer: Self-pay

## 2019-11-03 ENCOUNTER — Ambulatory Visit: Payer: Medicare Other | Admitting: Obstetrics

## 2019-11-03 ENCOUNTER — Other Ambulatory Visit (HOSPITAL_COMMUNITY)
Admission: RE | Admit: 2019-11-03 | Discharge: 2019-11-03 | Disposition: A | Discharge status: Home / Self Care | Payer: Medicare Other | Source: Ambulatory Visit | Attending: Obstetrics | Admitting: Obstetrics

## 2019-11-03 ENCOUNTER — Encounter: Payer: Self-pay | Admitting: Obstetrics

## 2019-11-03 VITALS — BP 147/73 | HR 76 | Wt 183.6 lb

## 2019-11-03 DIAGNOSIS — Z113 Encounter for screening for infections with a predominantly sexual mode of transmission: Secondary | ICD-10-CM

## 2019-11-03 DIAGNOSIS — N898 Other specified noninflammatory disorders of vagina: Secondary | ICD-10-CM | POA: Diagnosis present

## 2019-11-03 DIAGNOSIS — A6000 Herpesviral infection of urogenital system, unspecified: Secondary | ICD-10-CM | POA: Diagnosis not present

## 2019-11-03 MED ORDER — VALACYCLOVIR HCL 1 G PO TABS: ORAL_TABLET | ORAL | 99 refills | Status: DC

## 2019-11-03 MED ORDER — TINIDAZOLE 500 MG PO TABS: 1000.0000 mg | ORAL_TABLET | Freq: Every day | ORAL | 2 refills | Status: DC

## 2019-11-03 NOTE — Progress Notes (Signed)
Pt complains of vaginal discharge and odor, pt reports being sexually active.

## 2019-11-03 NOTE — Progress Notes (Addendum)
Patient ID: Wendy [name], female   DOB: 16-Apr-1966, 54 y.o.   MRN: [id number]  Chief Complaint  Patient presents with  . STD screening    HPI Wendy [name] is a 54 y.o. female.  Complains of malodorous vaginal discharge. HPI  Past Medical History:  Diagnosis Date  . Anxiety   . Asthma    daily and prn inhalers  . Cervical disc disease    decreased range of motion  . Chronic pain    shoulders, neck, lower back  . Deformity, hand    left  . Depression   . Endometriosis    Lupron injection Q 3 mos.  . Fluid retention in legs   . Injury, brachial plexus    left  . Multiple allergies    takes allergy shots  . PONV (postoperative nausea and vomiting)   . 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 Wears partial dentures    upper partial    Past Surgical History:  Procedure Laterality Date  . ANKLE RECONSTRUCTION  02/01/2012   Procedure: RECONSTRUCTION ANKLE;  Surgeon: Wylene Simmer, MD;  Location: [place];  Service: Orthopedics;  Laterality: Right;  Excision of right calcaneonavicular coalition with autologus fat graft interposition  . BRACHIAL PLEXUS EXPLORATION    . DIAGNOSTIC LAPAROSCOPY  10/02/2008   peritoneal bx.  . MULTIPLE TOOTH EXTRACTIONS     upper teeth and wisdom teeth  . OVARIAN CYST REMOVAL  2000  . RIB RESECTION     left - cervical rib removal  . SHOULDER ARTHROSCOPY W/ ROTATOR CUFF REPAIR  07/2011   right  . SHOULDER SURGERY     left  . TOOTH EXTRACTION     x 1    Family History  Problem Relation Age of Onset  . Cancer Mother   . Dementia Father   . Stroke Father     Social History Social History   Tobacco Use  . Smoking status: Current Every Day Smoker    Packs/day: 1.00    Years: 20.00    Pack years: 20.00    Types: Cigarettes  . Smokeless tobacco: Never Used  Substance Use Topics  . Alcohol use: No    Alcohol/week: 0.0 standard drinks    Comment: occasional  . Drug use: No    Allergies   Allergen Reactions  . Chocolate Hives  . Latex Hives, Itching and Swelling  . Shellfish Allergy   . Fish-Derived Products Nausea And Vomiting  . Peanut-Containing Drug Products Hives  . Soap Rash    Current Outpatient Medications  Medication Sig Dispense Refill  . albuterol (PROVENTIL HFA;VENTOLIN HFA) 108 (90 Base) MCG/ACT inhaler Inhale 2 puffs into the lungs every 4 (four) hours as needed for wheezing or shortness of breath (cough). 1 Inhaler 0  . beclomethasone (QVAR) 80 MCG/ACT inhaler Inhale 1 puff into the lungs 2 (two) times daily.     . Clotrimazole 1 % OINT Apply 1 application topically 2 (two) times daily as needed. 120 g 2  . cyclobenzaprine (FLEXERIL) 10 MG tablet Take 10 mg by mouth 2 (two) times daily as needed. For pain    . docusate sodium (COLACE) 100 MG capsule Take 100 mg by mouth 2 (two) times daily.    Marland Kitchen EPINEPHrine (EPIPEN JR) 0.15 MG/0.3ML injection Inject 0.15 mg into the muscle daily as needed for anaphylaxis.     Marland Kitchen ergocalciferol (VITAMIN D2) 50000 UNITS capsule Take 50,000 Units by mouth once a week.  Sundays    . escitalopram (LEXAPRO) 20 MG tablet Take 20 mg by mouth daily.     Marland Kitchen esomeprazole (NEXIUM) 20 MG capsule Take 20 mg by mouth daily at 12 noon.    . furosemide (LASIX) 20 MG tablet Take 10 mg by mouth as needed. For swelling    . gabapentin (NEURONTIN) 400 MG capsule Take 400 mg by mouth 3 (three) times daily.     Marland Kitchen HYDROcodone-acetaminophen (NORCO/VICODIN) 5-325 MG per tablet Take 1 tablet by mouth every 6 (six) hours as needed for pain. 10 tablet 0  . hydrocortisone 2.5 % lotion Apply topically 2 (two) times daily. 59 mL 0  . lurasidone (LATUDA) 40 MG TABS tablet Take 40 mg by mouth daily with breakfast.    . nortriptyline (PAMELOR) 50 MG capsule Take 50 mg by mouth at bedtime.    . ondansetron (ZOFRAN ODT) 4 MG disintegrating tablet Take 1 tablet (4 mg total) by mouth every 8 (eight) hours as needed for nausea or vomiting. 15 tablet 0  . potassium  chloride SA (K-DUR,KLOR-CON) 20 MEQ tablet Take 20 mEq by mouth 2 (two) times daily as needed (when taking lasix).     . QUEtiapine (SEROQUEL) 100 MG tablet Take 100 mg by mouth at bedtime.     . ranitidine (ZANTAC) 150 MG tablet Take 1 tablet (150 mg total) by mouth 2 (two) times daily. 30 tablet 0  . rosuvastatin (CRESTOR) 10 MG tablet Take 10 mg by mouth daily.    . methylPREDNISolone (MEDROL DOSEPAK) 4 MG TBPK tablet Take by mouth daily. Follow package instructions (Patient not taking: Reported on 04/11/2019) 21 tablet 0  . metroNIDAZOLE (FLAGYL) 500 MG tablet Take 1 tablet (500 mg total) by mouth 2 (two) times daily. (Patient not taking: Reported on 04/11/2019) 14 tablet 2  . sucralfate (CARAFATE) 1 GM/10ML suspension Take 10 mLs (1 g total) by mouth 4 (four) times daily -  with meals and at bedtime. (Patient not taking: Reported on 04/11/2019) 420 mL 0  . tinidazole (TINDAMAX) 500 MG tablet Take 2 tablets (1,000 mg total) by mouth daily with breakfast. 10 tablet 2  . valACYclovir (VALTREX) 1000 MG tablet Take 1 tablet po daily x 3 days prn for each outbreak. 30 tablet prn   No current facility-administered medications for this visit.    Review of Systems Review of Systems Constitutional: negative for fatigue and weight loss Respiratory: negative for cough and wheezing Cardiovascular: negative for chest pain, fatigue and palpitations Gastrointestinal: negative for abdominal pain and change in bowel habits Genitourinary:positive for malodorous vaginal discharge Integument/breast: negative for nipple discharge Musculoskeletal:negative for myalgias Neurological: negative for gait problems and tremors Behavioral/Psych: negative for abusive relationship, depression Endocrine: negative for temperature intolerance      Blood pressure (!) 147/73, pulse 76, weight 183 lb 9.6 oz (83.3 kg), last menstrual period 07/12/2016.  Physical Exam Physical Exam General:   alert  Skin:   no rash or  abnormalities  Lungs:   clear to auscultation bilaterally  Heart:   regular rate and rhythm, S1, S2 normal, no murmur, click, rub or gallop  Breasts:   normal without suspicious masses, skin or nipple changes or axillary nodes  Abdomen:  normal findings: no organomegaly, soft, non-tender and no hernia  Pelvis:  External genitalia: normal general appearance Urinary system: urethral meatus normal and bladder without fullness, nontender Vaginal: normal without tenderness, induration or masses Cervix: normal appearance Adnexa: normal bimanual exam Uterus: anteverted and non-tender, normal size  50% of 25 min visit spent on counseling and coordination of care.   Data Reviewed Labs  Assessment     1. Vaginal discharge Rx: - Cervicovaginal ancillary only( [place]) - tinidazole (TINDAMAX) 500 MG tablet; Take 2 tablets (1,000 mg total) by mouth daily with breakfast.  Dispense: 10 tablet; Refill: 2  2. Genital herpes Rx: - valACYclovir (VALTREX) 1000 MG tablet; Take 1 tablet po daily x 3 days prn for each outbreak.  Dispense: 30 tablet; Refill: prn  3. Screen for STD (sexually transmitted disease) Rx: - HIV antibody (with reflex) - RPR - Hepatitis B Surface AntiGEN - Hepatitis C Antibody    Plan    Orders Placed This Encounter  Procedures  . HIV antibody (with reflex)  . RPR  . Hepatitis B Surface AntiGEN  . Hepatitis C Antibody   Meds ordered this encounter  Medications  . valACYclovir (VALTREX) 1000 MG tablet    Sig: Take 1 tablet po daily x 3 days prn for each outbreak.    Dispense:  30 tablet    Refill:  prn  . tinidazole (TINDAMAX) 500 MG tablet    Sig: Take 2 tablets (1,000 mg total) by mouth daily with breakfast.    Dispense:  10 tablet    Refill:  2    Shelly Bombard, MD 11/03/2019 11:30 AM

## 2019-11-04 LAB — HEPATITIS C ANTIBODY: Hep C Virus Ab: <0.1 s/co ratio (ref 0.0–0.9)

## 2019-11-04 LAB — HEPATITIS B SURFACE ANTIGEN: Hepatitis B Surface Ag: NEGATIVE

## 2019-11-04 LAB — HIV ANTIBODY (ROUTINE TESTING W REFLEX): HIV Screen 4th Generation wRfx: NONREACTIVE

## 2019-11-04 LAB — RPR: RPR Ser Ql: NONREACTIVE

## 2019-11-06 ENCOUNTER — Other Ambulatory Visit: Payer: Self-pay | Admitting: Obstetrics

## 2019-11-06 LAB — CERVICOVAGINAL ANCILLARY ONLY
Bacterial Vaginitis (gardnerella): POSITIVE — AB
Candida Glabrata: NEGATIVE
Candida Vaginitis: NEGATIVE
Chlamydia: NEGATIVE
Comment: NEGATIVE
Comment: NEGATIVE
Comment: NEGATIVE
Comment: NEGATIVE
Comment: NEGATIVE
Comment: NORMAL
Neisseria Gonorrhea: NEGATIVE
Trichomonas: NEGATIVE

## 2019-11-10 ENCOUNTER — Other Ambulatory Visit: Payer: Self-pay

## 2019-11-10 NOTE — Patient Outreach (Signed)
[facility]) Care Management  11/10/2019  DENYS PARRADO 19-Dec-1965 [id number]   Medication Adherence call to Mrs. Whiteside Compliant Voice message left with a call back number. Mrs. Woodward is showing past due on Valsartan /Hctz 160/12.5 mg under [facility].   [facility] Management Direct Dial [phone]  Fax [phone] [email or web address]

## 2019-11-18 DIAGNOSIS — K219 Gastro-esophageal reflux disease without esophagitis: Secondary | ICD-10-CM | POA: Diagnosis not present

## 2019-11-18 DIAGNOSIS — J301 Allergic rhinitis due to pollen: Secondary | ICD-10-CM | POA: Diagnosis not present

## 2019-11-18 DIAGNOSIS — E785 Hyperlipidemia, unspecified: Secondary | ICD-10-CM | POA: Diagnosis not present

## 2019-12-07 ENCOUNTER — Ambulatory Visit (HOSPITAL_COMMUNITY)
Admission: EM | Admit: 2019-12-07 | Discharge: 2019-12-07 | Disposition: A | Discharge status: Home / Self Care | Payer: Medicare Other | Attending: Family Medicine | Admitting: Family Medicine

## 2019-12-07 ENCOUNTER — Other Ambulatory Visit: Payer: Self-pay

## 2019-12-07 ENCOUNTER — Encounter (HOSPITAL_COMMUNITY): Payer: Self-pay | Admitting: Emergency Medicine

## 2019-12-07 DIAGNOSIS — M5030 Other cervical disc degeneration, unspecified cervical region: Secondary | ICD-10-CM | POA: Diagnosis not present

## 2019-12-07 DIAGNOSIS — F419 Anxiety disorder, unspecified: Secondary | ICD-10-CM | POA: Insufficient documentation

## 2019-12-07 DIAGNOSIS — Z20822 Contact with and (suspected) exposure to covid-19: Secondary | ICD-10-CM | POA: Insufficient documentation

## 2019-12-07 DIAGNOSIS — N83209 Unspecified ovarian cyst, unspecified side: Secondary | ICD-10-CM | POA: Insufficient documentation

## 2019-12-07 DIAGNOSIS — Z79899 Other long term (current) drug therapy: Secondary | ICD-10-CM | POA: Insufficient documentation

## 2019-12-07 DIAGNOSIS — F17200 Nicotine dependence, unspecified, uncomplicated: Secondary | ICD-10-CM

## 2019-12-07 DIAGNOSIS — F17210 Nicotine dependence, cigarettes, uncomplicated: Secondary | ICD-10-CM | POA: Insufficient documentation

## 2019-12-07 DIAGNOSIS — R05 Cough: Secondary | ICD-10-CM | POA: Diagnosis not present

## 2019-12-07 DIAGNOSIS — J22 Unspecified acute lower respiratory infection: Secondary | ICD-10-CM

## 2019-12-07 DIAGNOSIS — F329 Major depressive disorder, single episode, unspecified: Secondary | ICD-10-CM | POA: Insufficient documentation

## 2019-12-07 DIAGNOSIS — K219 Gastro-esophageal reflux disease without esophagitis: Secondary | ICD-10-CM | POA: Insufficient documentation

## 2019-12-07 DIAGNOSIS — J449 Chronic obstructive pulmonary disease, unspecified: Secondary | ICD-10-CM | POA: Diagnosis not present

## 2019-12-07 DIAGNOSIS — E785 Hyperlipidemia, unspecified: Secondary | ICD-10-CM | POA: Diagnosis not present

## 2019-12-07 LAB — SARS CORONAVIRUS 2 (TAT 6-24 HRS): SARS Coronavirus 2: NEGATIVE

## 2019-12-07 MED ORDER — AMOXICILLIN 875 MG PO TABS: 875.0000 mg | ORAL_TABLET | Freq: Two times a day (BID) | ORAL | 0 refills | Status: DC

## 2019-12-07 MED ORDER — PREDNISONE 20 MG PO TABS: 20.0000 mg | ORAL_TABLET | Freq: Two times a day (BID) | ORAL | 0 refills | Status: DC

## 2019-12-07 MED ORDER — BENZONATATE 200 MG PO CAPS: 200.0000 mg | ORAL_CAPSULE | Freq: Two times a day (BID) | ORAL | 0 refills | Status: DC | PRN

## 2019-12-07 NOTE — Discharge Instructions (Signed)
Get plenty of rest Increase your fluids Take amoxicillin 2 times a day until gone Take Tessalon 2 times a day as needed for coughing Take prednisone as directed Continue your inhalers Call your primary care doctor if not improving in a couple days

## 2019-12-07 NOTE — ED Triage Notes (Signed)
Pt here with URI sx and cough x 10 days since having second covid vaccine; pt sts congestion denies fever

## 2019-12-07 NOTE — ED Provider Notes (Signed)
[place]    CSN: [id number] Arrival date & time: 12/07/19  1453      History   Chief Complaint Chief Complaint  Patient presents with  . URI  . Cough    HPI Wendy [name] is a 54 y.o. female.   HPI  Patient is a smoker.  She is on inhalers for asthma/COPD.  She has had a harsh cough with sputum production for 10 days.  No fever or chills.  She is somewhat tired.  Chest hurts from the coughing.  Appetite is normal.  She has had 2 Covid shots.  No known exposure to illness.  Has been trying over-the-counter medicines.  Is not getting better.  Denies increase in shortness of breath. Patient has a history of GERD.  Is compliant with medication.  She is on Crestor for hyperlipidemia, on mental health medications.  States she otherwise feels well. She works as a [employer] manager.  She has been trying to work in spite of not feeling well.   Past Medical History:  Diagnosis Date  . Anxiety   . Asthma    daily and prn inhalers  . Cervical disc disease    decreased range of motion  . Chronic pain    shoulders, neck, lower back  . Deformity, hand    left  . Depression   . Endometriosis    Lupron injection Q 3 mos.  . Fluid retention in legs   . Injury, brachial plexus    left  . Multiple allergies    takes allergy shots  . PONV (postoperative nausea and vomiting)   . 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 Wears partial dentures    upper partial    Patient Active Problem List   Diagnosis Date Noted  . BV (bacterial vaginosis) 03/06/2013  . ANXIETY 11/05/2007  . DEPRESSION 11/05/2007  . NAUSEA, CHRONIC 11/05/2007  . FIBROIDS, UTERUS 03/26/2007  . TOBACCO ABUSE 03/26/2007  . MIGRAINE, COMMON 03/26/2007  . OVARIAN CYST 03/26/2007  . 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 [place] DISEASE, LUMBOSACRAL SPINE 03/26/2007  . 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    Past Surgical History:  Procedure Laterality Date  . ANKLE  RECONSTRUCTION  02/01/2012   Procedure: RECONSTRUCTION ANKLE;  Surgeon: Wylene Simmer, MD;  Location: [place];  Service: Orthopedics;  Laterality: Right;  Excision of right calcaneonavicular coalition with autologus fat graft interposition  . BRACHIAL PLEXUS EXPLORATION    . DIAGNOSTIC LAPAROSCOPY  10/02/2008   peritoneal bx.  . MULTIPLE TOOTH EXTRACTIONS     upper teeth and wisdom teeth  . OVARIAN CYST REMOVAL  2000  . RIB RESECTION     left - cervical rib removal  . SHOULDER ARTHROSCOPY W/ ROTATOR CUFF REPAIR  07/2011   right  . SHOULDER SURGERY     left  . TOOTH EXTRACTION     x 1    OB History    Gravida  0   Para  0   Term  0   Preterm  0   AB  0   Living  0     SAB  0   TAB  0   Ectopic  0   Multiple  0   Live Births               Home Medications    Prior to Admission medications   Medication Sig Start Date End Date Taking? Authorizing Provider  albuterol (PROVENTIL  HFA;VENTOLIN HFA) 108 (90 Base) MCG/ACT inhaler Inhale 2 puffs into the lungs every 4 (four) hours as needed for wheezing or shortness of breath (cough). 11/20/16   Street, [place], PA-C  amoxicillin (AMOXIL) 875 MG tablet Take 1 tablet (875 mg total) by mouth 2 (two) times daily. 12/07/19   Raylene Everts, MD  beclomethasone (QVAR) 80 MCG/ACT inhaler Inhale 1 puff into the lungs 2 (two) times daily.     [provider]  benzonatate (TESSALON) 200 MG capsule Take 1 capsule (200 mg total) by mouth 2 (two) times daily as needed for cough. 12/07/19   Raylene Everts, MD  EPINEPHrine (EPIPEN JR) 0.15 MG/0.3ML injection Inject 0.15 mg into the muscle daily as needed for anaphylaxis.     [provider]  ergocalciferol (VITAMIN D2) 50000 UNITS capsule Take 50,000 Units by mouth once a week. Sundays    [provider]  escitalopram (LEXAPRO) 20 MG tablet Take 20 mg by mouth daily.     [provider]  esomeprazole (NEXIUM) 20 MG capsule Take  20 mg by mouth daily at 12 noon.    [provider]  furosemide (LASIX) 20 MG tablet Take 10 mg by mouth as needed. For swelling    [provider]  gabapentin (NEURONTIN) 400 MG capsule Take 400 mg by mouth 3 (three) times daily.     [provider]  lurasidone (LATUDA) 40 MG TABS tablet Take 40 mg by mouth daily with breakfast.    [provider]  nortriptyline (PAMELOR) 50 MG capsule Take 50 mg by mouth at bedtime.    [provider]  potassium chloride SA (K-DUR,KLOR-CON) 20 MEQ tablet Take 20 mEq by mouth 2 (two) times daily as needed (when taking lasix).     [provider]  predniSONE (DELTASONE) 20 MG tablet Take 1 tablet (20 mg total) by mouth 2 (two) times daily with a meal. 12/07/19   Raylene Everts, MD  ranitidine (ZANTAC) 150 MG tablet Take 1 tablet (150 mg total) by mouth 2 (two) times daily. 11/20/16   Street, [place], PA-C  rosuvastatin (CRESTOR) 10 MG tablet Take 10 mg by mouth daily.    [provider]  tinidazole (TINDAMAX) 500 MG tablet Take 2 tablets (1,000 mg total) by mouth daily with breakfast. 11/03/19   Shelly Bombard, MD  valACYclovir (VALTREX) 1000 MG tablet Take 1 tablet po daily x 3 days prn for each outbreak. 11/03/19   Shelly Bombard, MD  QUEtiapine (SEROQUEL) 100 MG tablet Take 100 mg by mouth at bedtime.   12/07/19  [provider]  sucralfate (CARAFATE) 1 GM/10ML suspension Take 10 mLs (1 g total) by mouth 4 (four) times daily -  with meals and at bedtime. Patient not taking: Reported on 04/11/2019 02/18/19 12/07/19  Franchot Heidelberg, PA-C    Family History Family History  Problem Relation Age of Onset  . Cancer Mother   . Dementia Father   . Stroke Father     Social History Social History   Tobacco Use  . Smoking status: Current Every Day Smoker    Packs/day: 1.00    Years: 20.00    Pack years: 20.00    Types: Cigarettes  . Smokeless tobacco: Never Used  Substance Use  Topics  . Alcohol use: No    Alcohol/week: 0.0 standard drinks    Comment: occasional  . Drug use: No     Allergies   Chocolate, Latex, Shellfish allergy, Fish-derived products, Peanut-containing  drug products, and Soap   Review of Systems Review of Systems  Constitutional: Positive for fatigue. Negative for appetite change, chills and fever.  HENT: Negative for congestion and rhinorrhea.   Respiratory: Positive for cough and shortness of breath.   Musculoskeletal: Negative for myalgias.  Neurological: Negative for headaches.     Physical Exam Triage Vital Signs ED Triage Vitals [12/07/19 1514]  Enc Vitals Group     BP (!) 173/75     Pulse Rate 82     Resp 18     Temp 98.2 F (36.8 C)     Temp Source Oral     SpO2 99 %     Weight      Height      Head Circumference      Peak Flow      Pain Score 5     Pain Loc      Pain Edu?      Excl. in [place]?    No data found.  Updated Vital Signs BP (!) 173/75 (BP Location: Right Arm)   Pulse 82   Temp 98.2 F (36.8 C) (Oral)   Resp 18   LMP 07/12/2016 (Approximate) Comment: 2-3 day cycle  SpO2 99%  Physical Exam Constitutional:      General: She is not in acute distress.    Appearance: She is well-developed.     Comments: Mild overweight.  Harsh cough  HENT:     Head: Normocephalic and atraumatic.     Mouth/Throat:     Comments: Mask in place.  Mucous membranes moist. Eyes:     Conjunctiva/sclera: Conjunctivae normal.     Pupils: Pupils are equal, round, and reactive to light.  Cardiovascular:     Rate and Rhythm: Normal rate and regular rhythm.     Heart sounds: Normal heart sounds.  Pulmonary:     Effort: Pulmonary effort is normal. No respiratory distress.     Breath sounds: Rhonchi present.     Comments: Few anterior rhonchi Musculoskeletal:        General: Normal range of motion.     Cervical back: Normal range of motion.  Skin:    General: Skin is warm and dry.  Neurological:     Mental Status: She  is alert.  Psychiatric:        Mood and Affect: Mood normal.        Behavior: Behavior normal.      UC Treatments / Results  Labs (all labs ordered are listed, but only abnormal results are displayed) Labs Reviewed  SARS CORONAVIRUS 2 (TAT 6-24 HRS)    EKG   Radiology No results found.  Procedures Procedures (including critical care time)  Medications Ordered in UC Medications - No data to display  Initial Impression / Assessment and Plan / UC Course  I have reviewed the triage vital signs and the nursing notes.  Pertinent labs & imaging results that were available during my care of the patient were reviewed by me and considered in my medical decision making (see chart for details).     Patient likely had a viral upper respiratory infection.  Doubt Covid.  Because of her COPD/asthma and her persistent symptoms, worsening sputum and fatigue I think an antibiotic is indicated.  We will also cover her with prednisone.  She is to continue her inhalers.  Follow-up with her PCP. Final Clinical Impressions(s) / UC Diagnoses   Final diagnoses:  LRTI (lower respiratory tract infection)  TOBACCO ABUSE  Discharge Instructions     Get plenty of rest Increase your fluids Take amoxicillin 2 times a day until gone Take Tessalon 2 times a day as needed for coughing Take prednisone as directed Continue your inhalers Call your primary care doctor if not improving in a couple days   ED Prescriptions    Medication Sig Dispense Auth. Provider   amoxicillin (AMOXIL) 875 MG tablet Take 1 tablet (875 mg total) by mouth 2 (two) times daily. 14 tablet Raylene Everts, MD   predniSONE (DELTASONE) 20 MG tablet Take 1 tablet (20 mg total) by mouth 2 (two) times daily with a meal. 10 tablet Raylene Everts, MD   benzonatate (TESSALON) 200 MG capsule Take 1 capsule (200 mg total) by mouth 2 (two) times daily as needed for cough. 20 capsule Raylene Everts, MD     PDMP not  reviewed this encounter.   Raylene Everts, MD 12/07/19 [phone]

## 2019-12-12 ENCOUNTER — Other Ambulatory Visit: Payer: Self-pay

## 2019-12-12 ENCOUNTER — Emergency Department (HOSPITAL_COMMUNITY)
Admission: EM | Admit: 2019-12-12 | Discharge: 2019-12-12 | Disposition: A | Discharge status: Home / Self Care | Payer: Medicare Other | Attending: Emergency Medicine | Admitting: Emergency Medicine

## 2019-12-12 ENCOUNTER — Encounter (HOSPITAL_COMMUNITY): Payer: Self-pay | Admitting: Emergency Medicine

## 2019-12-12 ENCOUNTER — Emergency Department (HOSPITAL_COMMUNITY): Payer: Medicare Other

## 2019-12-12 DIAGNOSIS — F17210 Nicotine dependence, cigarettes, uncomplicated: Secondary | ICD-10-CM | POA: Diagnosis not present

## 2019-12-12 DIAGNOSIS — Z79899 Other long term (current) drug therapy: Secondary | ICD-10-CM | POA: Insufficient documentation

## 2019-12-12 DIAGNOSIS — R05 Cough: Secondary | ICD-10-CM | POA: Insufficient documentation

## 2019-12-12 DIAGNOSIS — R0602 Shortness of breath: Secondary | ICD-10-CM | POA: Diagnosis not present

## 2019-12-12 DIAGNOSIS — J45909 Unspecified asthma, uncomplicated: Secondary | ICD-10-CM | POA: Insufficient documentation

## 2019-12-12 DIAGNOSIS — H9202 Otalgia, left ear: Secondary | ICD-10-CM | POA: Diagnosis present

## 2019-12-12 DIAGNOSIS — Z91010 Allergy to peanuts: Secondary | ICD-10-CM | POA: Insufficient documentation

## 2019-12-12 DIAGNOSIS — R059 Cough, unspecified: Secondary | ICD-10-CM

## 2019-12-12 DIAGNOSIS — Z91040 Latex allergy status: Secondary | ICD-10-CM | POA: Diagnosis not present

## 2019-12-12 DIAGNOSIS — R001 Bradycardia, unspecified: Secondary | ICD-10-CM | POA: Diagnosis not present

## 2019-12-12 LAB — CBC WITH DIFFERENTIAL/PLATELET
Abs Immature Granulocytes: 0.03 10*3/uL (ref 0.00–0.07)
Basophils Absolute: 0 10*3/uL (ref 0.0–0.1)
Basophils Relative: 0 %
Eosinophils Absolute: 0.1 10*3/uL (ref 0.0–0.5)
Eosinophils Relative: 1 %
HCT: 41.3 % (ref 36.0–46.0)
Hemoglobin: 12.9 g/dL (ref 12.0–15.0)
Immature Granulocytes: 0 %
Lymphocytes Relative: 38 %
Lymphs Abs: 4.1 10*3/uL — ABNORMAL HIGH (ref 0.7–4.0)
MCH: 28.1 pg (ref 26.0–34.0)
MCHC: 31.2 g/dL (ref 30.0–36.0)
MCV: 90 fL (ref 80.0–100.0)
Monocytes Absolute: 0.8 10*3/uL (ref 0.1–1.0)
Monocytes Relative: 8 %
Neutro Abs: 5.9 10*3/uL (ref 1.7–7.7)
Neutrophils Relative %: 53 %
Platelets: 267 10*3/uL (ref 150–400)
RBC: 4.59 MIL/uL (ref 3.87–5.11)
RDW: 13.2 % (ref 11.5–15.5)
WBC: 10.9 10*3/uL — ABNORMAL HIGH (ref 4.0–10.5)
nRBC: 0 % (ref 0.0–0.2)

## 2019-12-12 LAB — COMPREHENSIVE METABOLIC PANEL
ALT: 11 U/L (ref 0–44)
AST: 12 U/L — ABNORMAL LOW (ref 15–41)
Albumin: 3.8 g/dL (ref 3.5–5.0)
Alkaline Phosphatase: 50 U/L (ref 38–126)
Anion gap: 11 (ref 5–15)
BUN: 9 mg/dL (ref 6–20)
CO2: 26 mmol/L (ref 22–32)
Calcium: 8.9 mg/dL (ref 8.9–10.3)
Chloride: 104 mmol/L (ref 98–111)
Creatinine, Ser: 0.78 mg/dL (ref 0.44–1.00)
GFR calc Af Amer: >60 mL/min (ref >60)
GFR calc non Af Amer: >60 mL/min (ref >60)
Glucose, Bld: 95 mg/dL (ref 70–99)
Potassium: 3.2 mmol/L — ABNORMAL LOW (ref 3.5–5.1)
Sodium: 141 mmol/L (ref 135–145)
Total Bilirubin: 0.6 mg/dL (ref 0.3–1.2)
Total Protein: 6.8 g/dL (ref 6.5–8.1)

## 2019-12-12 LAB — GROUP A STREP BY PCR: Group A Strep by PCR: NOT DETECTED

## 2019-12-12 MED ORDER — FLUTICASONE PROPIONATE 50 MCG/ACT NA SUSP
1.0000 | Freq: Every day | NASAL | Status: DC
  Administered 2019-12-12: 22:00:00 1 via NASAL
  Filled 2019-12-12: qty 16

## 2019-12-12 MED ORDER — ALBUTEROL SULFATE HFA 108 (90 BASE) MCG/ACT IN AERS
2.0000 | INHALATION_SPRAY | Freq: Once | RESPIRATORY_TRACT | Status: AC
  Administered 2019-12-12: 21:00:00 2 via RESPIRATORY_TRACT
  Filled 2019-12-12: qty 6.7

## 2019-12-12 MED ORDER — LIDOCAINE VISCOUS HCL 2 % MT SOLN
15.0000 mL | Freq: Once | OROMUCOSAL | Status: AC
  Administered 2019-12-12: 21:00:00 15 mL via OROMUCOSAL
  Filled 2019-12-12: qty 15

## 2019-12-12 NOTE — ED Provider Notes (Signed)
[facility] Provider Note   CSN: [id number] Arrival date & time: 12/12/19  1834     History Chief Complaint  Patient presents with  . Sore Throat  . Shortness of Breath  . Otalgia    Wendy [name] is a 54 y.o. female.  The history is provided by the patient and medical records. No language interpreter was used.   Wendy [name] is a 54 y.o. female who presents to the Emergency Department complaining of multiple complaints.  She has been feeling poorly since she received her first COVID 19 vaccine on 2/25.  Sxs worsened after her second dose on 3/18.  She has left sided ear pain, sore throat, cough, sob.  Cough is productive of phlegm.  She has associated intermittent dizziness and weakness.  Denies fever, hemoptysis, chest pain.  She has some gagging when she coughs, otherwise no N/V.  She has chronic lower extremity edema (unchanged from baseline).    She saw her PCP (2 weeks ago) and was treated with allegra with no change in sxs.  She went to Urgent Care this past Sunday and she was given prednisone,amoxicillin, cough medicine with slight improvement in her sxs only to return today.      Past Medical History:  Diagnosis Date  . Anxiety   . Asthma    daily and prn inhalers  . Cervical disc disease    decreased range of motion  . Chronic pain    shoulders, neck, lower back  . Deformity, hand    left  . Depression   . Endometriosis    Lupron injection Q 3 mos.  . Fluid retention in legs   . Injury, brachial plexus    left  . Multiple allergies    takes allergy shots  . PONV (postoperative nausea and vomiting)   . 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 Wears partial dentures    upper partial    Patient Active Problem List   Diagnosis Date Noted  . BV (bacterial vaginosis) 03/06/2013  . ANXIETY 11/05/2007  . DEPRESSION 11/05/2007  . NAUSEA, CHRONIC 11/05/2007  . FIBROIDS, UTERUS 03/26/2007  .  TOBACCO ABUSE 03/26/2007  . MIGRAINE, COMMON 03/26/2007  . OVARIAN CYST 03/26/2007  . 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 [place] DISEASE, LUMBOSACRAL SPINE 03/26/2007  . 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    Past Surgical History:  Procedure Laterality Date  . ANKLE RECONSTRUCTION  02/01/2012   Procedure: RECONSTRUCTION ANKLE;  Surgeon: Wylene Simmer, MD;  Location: [facility];  Service: Orthopedics;  Laterality: Right;  Excision of right calcaneonavicular coalition with autologus fat graft interposition  . BRACHIAL PLEXUS EXPLORATION    . DIAGNOSTIC LAPAROSCOPY  10/02/2008   peritoneal bx.  . MULTIPLE TOOTH EXTRACTIONS     upper teeth and wisdom teeth  . OVARIAN CYST REMOVAL  2000  . RIB RESECTION     left - cervical rib removal  . SHOULDER ARTHROSCOPY W/ ROTATOR CUFF REPAIR  07/2011   right  . SHOULDER SURGERY     left  . TOOTH EXTRACTION     x 1     OB History    Gravida  0   Para  0   Term  0   Preterm  0   AB  0   Living  0     SAB  0   TAB  0   Ectopic  0   Multiple  0   Live Births              Family History  Problem Relation Age of Onset  . Cancer Mother   . Dementia Father   . Stroke Father     Social History   Tobacco Use  . Smoking status: Current Every Day Smoker    Packs/day: 1.00    Years: 20.00    Pack years: 20.00    Types: Cigarettes  . Smokeless tobacco: Never Used  Substance Use Topics  . Alcohol use: No    Alcohol/week: 0.0 standard drinks    Comment: occasional  . Drug use: No    Home Medications Prior to Admission medications   Medication Sig Start Date End Date Taking? Authorizing Provider  albuterol (PROVENTIL HFA;VENTOLIN HFA) 108 (90 Base) MCG/ACT inhaler Inhale 2 puffs into the lungs every 4 (four) hours as needed for wheezing or shortness of breath (cough). 11/20/16  Yes Street, Mercedes, PA-C  amLODipine (NORVASC) 5 MG tablet Take 5 mg by mouth at bedtime. 12/09/19  Yes  [provider]  amoxicillin (AMOXIL) 875 MG tablet Take 1 tablet (875 mg total) by mouth 2 (two) times daily. 12/07/19  Yes Raylene Everts, MD  beclomethasone (QVAR) 80 MCG/ACT inhaler Inhale 1 puff into the lungs 2 (two) times daily as needed (sob/wheezing).    Yes [provider]  benzonatate (TESSALON) 200 MG capsule Take 1 capsule (200 mg total) by mouth 2 (two) times daily as needed for cough. 12/07/19  Yes Raylene Everts, MD  celecoxib (CELEBREX) 200 MG capsule Take 200 mg by mouth daily as needed for mild pain or moderate pain.  09/10/19  Yes [provider]  divalproex (DEPAKOTE) 250 MG DR tablet Take 250 mg by mouth 2 (two) times daily. 10/10/19  Yes [provider]  EPINEPHrine (EPIPEN JR) 0.15 MG/0.3ML injection Inject 0.15 mg into the muscle daily as needed for anaphylaxis.    Yes [provider]  ergocalciferol (VITAMIN D2) 50000 UNITS capsule Take 50,000 Units by mouth once a week. Sundays   Yes [provider]  escitalopram (LEXAPRO) 20 MG tablet Take 20 mg by mouth daily.    Yes [provider]  furosemide (LASIX) 20 MG tablet Take 10 mg by mouth daily as needed for fluid. For swelling   Yes [provider]  gabapentin (NEURONTIN) 400 MG capsule Take 400 mg by mouth 3 (three) times daily.    Yes [provider]  HYDROcodone-acetaminophen (NORCO/VICODIN) 5-325 MG tablet Take 1 tablet by mouth every 6 (six) hours as needed for moderate pain or severe pain.   Yes [provider]  hydrOXYzine (ATARAX/VISTARIL) 25 MG tablet Take 25 mg by mouth 3 (three) times daily as needed for anxiety or itching.  10/09/19  Yes [provider]  lurasidone (LATUDA) 40 MG TABS tablet Take 40 mg by mouth daily with breakfast.   Yes [provider]  nortriptyline (PAMELOR) 50 MG capsule Take 50 mg by mouth at bedtime.   Yes [provider]  pantoprazole (PROTONIX) 40 MG tablet Take 40 mg  by mouth daily as needed (heartburn).  11/19/19  Yes [provider]  potassium chloride SA (K-DUR,KLOR-CON) 20 MEQ tablet Take 20 mEq by mouth 2 (two) times daily as needed (when taking lasix).    Yes [provider]  ranitidine (ZANTAC) 150 MG tablet Take 1 tablet (150 mg total) by mouth 2 (two) times daily. Patient taking differently: Take 150 mg  by mouth 2 (two) times daily as needed for heartburn.  11/20/16  Yes Street, [place], PA-C  rosuvastatin (CRESTOR) 10 MG tablet Take 10 mg by mouth daily.   Yes [provider]  valACYclovir (VALTREX) 1000 MG tablet Take 1 tablet po daily x 3 days prn for each outbreak. Patient taking differently: Take 1,000 mg by mouth 3 (three) times daily as needed (outbreak). Take 1 tablet po daily x 3 days prn for each outbreak. 11/03/19  Yes Shelly Bombard, MD  valsartan-hydrochlorothiazide (DIOVAN-HCT) 160-12.5 MG tablet Take 1 tablet by mouth every morning. 12/08/19  Yes [provider]  predniSONE (DELTASONE) 20 MG tablet Take 1 tablet (20 mg total) by mouth 2 (two) times daily with a meal. Patient not taking: Reported on 12/12/2019 12/07/19   Raylene Everts, MD  tinidazole [facility]) 500 MG tablet Take 2 tablets (1,000 mg total) by mouth daily with breakfast. Patient not taking: Reported on 12/12/2019 11/03/19   Shelly Bombard, MD  QUEtiapine (SEROQUEL) 100 MG tablet Take 100 mg by mouth at bedtime.   12/07/19  [provider]  sucralfate (CARAFATE) 1 GM/10ML suspension Take 10 mLs (1 g total) by mouth 4 (four) times daily -  with meals and at bedtime. Patient not taking: Reported on 04/11/2019 02/18/19 12/07/19  Caccavale, Jillyn Ledger, PA-C    Allergies    Pfizer-biontech covid-19 vacc [covid-19 mrna vacc (moderna)], Chocolate, Latex, Shellfish allergy, Fish-derived products, Peanut-containing drug products, and Soap  Review of Systems   Review of Systems  All other systems reviewed and are negative.   Physical  Exam Updated Vital Signs BP (!) 154/71 (BP Location: Right Arm)   Pulse 62   Temp 98 F (36.7 C) (Oral)   Resp 16   Ht 4\' 11"  (1.499 m)   Wt 84.4 kg   LMP 07/12/2016 (Approximate) Comment: 2-3 day cycle  SpO2 99%   BMI 37.57 kg/m   Physical Exam Vitals and nursing note reviewed.  Constitutional:      Appearance: She is well-developed.  HENT:     Head: Normocephalic and atraumatic.     Right Ear: Tympanic membrane normal.     Left Ear: Tympanic membrane normal.     Mouth/Throat:     Mouth: Mucous membranes are moist.     Pharynx: No oropharyngeal exudate or posterior oropharyngeal erythema.  Cardiovascular:     Rate and Rhythm: Normal rate and regular rhythm.     Heart sounds: No murmur.  Pulmonary:     Effort: Pulmonary effort is normal. No respiratory distress.     Breath sounds: Normal breath sounds.  Abdominal:     Palpations: Abdomen is soft.     Tenderness: There is no abdominal tenderness. There is no guarding or rebound.  Musculoskeletal:        General: No swelling or tenderness.     Cervical back: Neck supple.  Skin:    General: Skin is warm and dry.  Neurological:     Mental Status: She is alert and oriented to person, place, and time.  Psychiatric:        Behavior: Behavior normal.     ED Results / Procedures / Treatments   Labs (all labs ordered are listed, but only abnormal results are displayed) Labs Reviewed  CBC WITH DIFFERENTIAL/PLATELET - Abnormal; Notable for the following components:      Result Value   WBC 10.9 (*)    Lymphs Abs 4.1 (*)    All other components within  normal limits  COMPREHENSIVE METABOLIC PANEL - Abnormal; Notable for the following components:   Potassium 3.2 (*)    AST 12 (*)    All other components within normal limits  GROUP A STREP BY PCR    EKG EKG Interpretation  Date/Time:  Friday December 12 2019 19:28:58 EDT Ventricular Rate:  57 PR Interval:  148 QRS Duration: 80 QT Interval:  388 QTC  Calculation: 377 R Axis:   -53 Text Interpretation: Sinus bradycardia Left anterior fascicular block Nonspecific T wave abnormality Abnormal ECG Confirmed by Quintella Reichert [phone]) on 12/12/2019 7:45:53 PM   Radiology DG Chest 2 View  Result Date: 12/12/2019 CLINICAL DATA:  Short of breath and dizziness EXAM: CHEST - 2 VIEW COMPARISON:  09/24/2018 FINDINGS: The heart size and mediastinal contours are within normal limits. Both lungs are clear. The visualized skeletal structures are unremarkable. IMPRESSION: No active cardiopulmonary disease. Electronically Signed   By: Randa Ngo M.D.   On: 12/12/2019 19:47    Procedures Procedures (including critical care time)  Medications Ordered in ED Medications  fluticasone (FLONASE) 50 MCG/ACT nasal spray 1 spray (has no administration in time range)  lidocaine (XYLOCAINE) 2 % viscous mouth solution 15 mL (15 mLs Mouth/Throat Given 12/12/19 2046)  albuterol (VENTOLIN HFA) 108 (90 Base) MCG/ACT inhaler 2 puff (2 puffs Inhalation Given 12/12/19 2047)    ED Course  I have reviewed the triage vital signs and the nursing notes.  Pertinent labs & imaging results that were available during my care of the patient were reviewed by me and considered in my medical decision making (see chart for details).    MDM Rules/Calculators/A&P                     Patient here for evaluation of progressive cough, sore throat for the last month after receiving her COVID 19 vaccine. She did have outpatient testing performed that was negative for the virus. She is non-toxic appearing on evaluation with no respiratory distress. There is no clinical evidence of acute bacterial infection. Presentation is not consistent with PE, CHF. Discussed with patient home care for cough, congestion and sore throat. Discussed symptomatic treatment at home. Question if there is some allergic component, will start Flonase. Discussed PCP follow-up and return precautions.  Wendy [name]  was evaluated in Emergency Department on 12/12/2019 for the symptoms described in the history of present illness. She was evaluated in the context of the global COVID-19 pandemic, which necessitated consideration that the patient might be at risk for infection with the SARS-CoV-2 virus that causes COVID-19. Institutional protocols and algorithms that pertain to the evaluation of patients at risk for COVID-19 are in a state of rapid change based on information released by regulatory bodies including the CDC and federal and state organizations. These policies and algorithms were followed during the patient's care in the ED.   Final Clinical Impression(s) / ED Diagnoses Final diagnoses:  Cough    Rx / DC Orders ED Discharge Orders    None       Quintella Reichert, MD 12/12/19 2051

## 2019-12-12 NOTE — Discharge Instructions (Addendum)
Continue to use the Flonase, one spray in each nostril once daily as well as the albuterol, to puffs every four hours as needed for cough. Please follow-up with your family doctor for further evaluation.

## 2019-12-12 NOTE — ED Notes (Signed)
Pt verbalized understanding of d/c instructions, follow up care and s/s requiring return to ED. Pt had no further questions  

## 2019-12-12 NOTE — ED Triage Notes (Signed)
Patient here with ear pain, shortness of breath and sore throat.  Patient states she is congested, she has been diagnosed with URI.  She states she was seen at Urgent Care, given prednisone and antibiotics but she is still short of breath and feeling poorly.  She is having right ear pain.  This all started after getting her Covid vaccines.  She states she has had 3 covid tests, all negative.

## 2020-02-04 DIAGNOSIS — M545 Low back pain: Secondary | ICD-10-CM | POA: Diagnosis not present

## 2020-02-04 DIAGNOSIS — M542 Cervicalgia: Secondary | ICD-10-CM | POA: Diagnosis not present

## 2020-02-05 DIAGNOSIS — Z1231 Encounter for screening mammogram for malignant neoplasm of breast: Secondary | ICD-10-CM | POA: Diagnosis not present

## 2020-03-10 DIAGNOSIS — E785 Hyperlipidemia, unspecified: Secondary | ICD-10-CM | POA: Diagnosis not present

## 2020-03-10 DIAGNOSIS — I10 Essential (primary) hypertension: Secondary | ICD-10-CM | POA: Diagnosis not present

## 2020-03-10 DIAGNOSIS — K219 Gastro-esophageal reflux disease without esophagitis: Secondary | ICD-10-CM | POA: Diagnosis not present

## 2020-03-12 ENCOUNTER — Other Ambulatory Visit: Payer: Self-pay

## 2020-03-12 DIAGNOSIS — B9689 Other specified bacterial agents as the cause of diseases classified elsewhere: Secondary | ICD-10-CM

## 2020-03-12 MED ORDER — METRONIDAZOLE 500 MG PO TABS: 500.0000 mg | ORAL_TABLET | Freq: Two times a day (BID) | ORAL | 0 refills | Status: AC

## 2020-03-12 NOTE — Progress Notes (Signed)
Rx sent per protocol for vaginal odor.

## 2020-03-19 DIAGNOSIS — T7840XD Allergy, unspecified, subsequent encounter: Secondary | ICD-10-CM | POA: Diagnosis not present

## 2020-03-19 DIAGNOSIS — R1310 Dysphagia, unspecified: Secondary | ICD-10-CM | POA: Diagnosis not present

## 2020-03-22 ENCOUNTER — Other Ambulatory Visit: Payer: Self-pay | Admitting: Family Medicine

## 2020-03-22 ENCOUNTER — Other Ambulatory Visit (HOSPITAL_COMMUNITY): Payer: Self-pay | Admitting: Family Medicine

## 2020-03-22 DIAGNOSIS — R1310 Dysphagia, unspecified: Secondary | ICD-10-CM

## 2020-03-29 ENCOUNTER — Ambulatory Visit (HOSPITAL_COMMUNITY): Payer: Medicare Other

## 2020-03-31 ENCOUNTER — Other Ambulatory Visit: Payer: Self-pay

## 2020-03-31 ENCOUNTER — Ambulatory Visit (HOSPITAL_COMMUNITY)
Admission: RE | Admit: 2020-03-31 | Discharge: 2020-03-31 | Disposition: A | Discharge status: Home / Self Care | Payer: Medicare Other | Source: Ambulatory Visit | Attending: Family Medicine | Admitting: Family Medicine

## 2020-03-31 DIAGNOSIS — R1310 Dysphagia, unspecified: Secondary | ICD-10-CM | POA: Diagnosis not present

## 2020-03-31 DIAGNOSIS — K224 Dyskinesia of esophagus: Secondary | ICD-10-CM | POA: Diagnosis not present

## 2020-05-13 DIAGNOSIS — M545 Low back pain: Secondary | ICD-10-CM | POA: Diagnosis not present

## 2020-05-13 DIAGNOSIS — M542 Cervicalgia: Secondary | ICD-10-CM | POA: Diagnosis not present

## 2020-06-17 DIAGNOSIS — M5412 Radiculopathy, cervical region: Secondary | ICD-10-CM | POA: Diagnosis not present

## 2020-06-25 DIAGNOSIS — M940 Chondrocostal junction syndrome [Tietze]: Secondary | ICD-10-CM | POA: Diagnosis not present

## 2020-06-25 DIAGNOSIS — K2100 Gastro-esophageal reflux disease with esophagitis, without bleeding: Secondary | ICD-10-CM | POA: Diagnosis not present

## 2020-07-05 DIAGNOSIS — M940 Chondrocostal junction syndrome [Tietze]: Secondary | ICD-10-CM | POA: Diagnosis not present

## 2020-07-05 DIAGNOSIS — K2101 Gastro-esophageal reflux disease with esophagitis, with bleeding: Secondary | ICD-10-CM | POA: Diagnosis not present

## 2020-07-05 DIAGNOSIS — Z0000 Encounter for general adult medical examination without abnormal findings: Secondary | ICD-10-CM | POA: Diagnosis not present

## 2020-07-05 DIAGNOSIS — I10 Essential (primary) hypertension: Secondary | ICD-10-CM | POA: Diagnosis not present

## 2020-07-14 ENCOUNTER — Emergency Department (HOSPITAL_COMMUNITY): Payer: Medicare Other

## 2020-07-14 ENCOUNTER — Inpatient Hospital Stay (HOSPITAL_COMMUNITY)
Admission: EM | Admit: 2020-07-14 | Discharge: 2020-07-16 | DRG: 247 | Disposition: A | Discharge status: Home / Self Care | Payer: Medicare Other | Attending: Cardiology | Admitting: Cardiology

## 2020-07-14 ENCOUNTER — Other Ambulatory Visit: Payer: Self-pay

## 2020-07-14 ENCOUNTER — Encounter (HOSPITAL_COMMUNITY): Payer: Self-pay | Admitting: Emergency Medicine

## 2020-07-14 DIAGNOSIS — I214 Non-ST elevation (NSTEMI) myocardial infarction: Secondary | ICD-10-CM | POA: Diagnosis not present

## 2020-07-14 DIAGNOSIS — E785 Hyperlipidemia, unspecified: Secondary | ICD-10-CM | POA: Diagnosis present

## 2020-07-14 DIAGNOSIS — I361 Nonrheumatic tricuspid (valve) insufficiency: Secondary | ICD-10-CM | POA: Diagnosis not present

## 2020-07-14 DIAGNOSIS — Z887 Allergy status to serum and vaccine status: Secondary | ICD-10-CM | POA: Diagnosis not present

## 2020-07-14 DIAGNOSIS — Z8249 Family history of ischemic heart disease and other diseases of the circulatory system: Secondary | ICD-10-CM | POA: Diagnosis not present

## 2020-07-14 DIAGNOSIS — F17200 Nicotine dependence, unspecified, uncomplicated: Secondary | ICD-10-CM | POA: Diagnosis present

## 2020-07-14 DIAGNOSIS — Z823 Family history of stroke: Secondary | ICD-10-CM

## 2020-07-14 DIAGNOSIS — Z91040 Latex allergy status: Secondary | ICD-10-CM | POA: Diagnosis not present

## 2020-07-14 DIAGNOSIS — I340 Nonrheumatic mitral (valve) insufficiency: Secondary | ICD-10-CM | POA: Diagnosis not present

## 2020-07-14 DIAGNOSIS — Z955 Presence of coronary angioplasty implant and graft: Secondary | ICD-10-CM

## 2020-07-14 DIAGNOSIS — I351 Nonrheumatic aortic (valve) insufficiency: Secondary | ICD-10-CM | POA: Diagnosis not present

## 2020-07-14 DIAGNOSIS — Z6841 Body Mass Index (BMI) 40.0 and over, adult: Secondary | ICD-10-CM

## 2020-07-14 DIAGNOSIS — I2510 Atherosclerotic heart disease of native coronary artery without angina pectoris: Secondary | ICD-10-CM | POA: Diagnosis not present

## 2020-07-14 DIAGNOSIS — R079 Chest pain, unspecified: Secondary | ICD-10-CM | POA: Diagnosis not present

## 2020-07-14 DIAGNOSIS — R0602 Shortness of breath: Secondary | ICD-10-CM | POA: Diagnosis not present

## 2020-07-14 DIAGNOSIS — F32A Depression, unspecified: Secondary | ICD-10-CM | POA: Diagnosis not present

## 2020-07-14 DIAGNOSIS — Z20822 Contact with and (suspected) exposure to covid-19: Secondary | ICD-10-CM | POA: Diagnosis present

## 2020-07-14 DIAGNOSIS — I10 Essential (primary) hypertension: Secondary | ICD-10-CM | POA: Diagnosis not present

## 2020-07-14 DIAGNOSIS — J45909 Unspecified asthma, uncomplicated: Secondary | ICD-10-CM | POA: Diagnosis not present

## 2020-07-14 DIAGNOSIS — Z79899 Other long term (current) drug therapy: Secondary | ICD-10-CM

## 2020-07-14 DIAGNOSIS — F419 Anxiety disorder, unspecified: Secondary | ICD-10-CM | POA: Diagnosis present

## 2020-07-14 DIAGNOSIS — E876 Hypokalemia: Secondary | ICD-10-CM | POA: Diagnosis not present

## 2020-07-14 LAB — CBC
HCT: 39.3 % (ref 36.0–46.0)
Hemoglobin: 12.4 g/dL (ref 12.0–15.0)
MCH: 29 pg (ref 26.0–34.0)
MCHC: 31.6 g/dL (ref 30.0–36.0)
MCV: 92 fL (ref 80.0–100.0)
Platelets: 236 10*3/uL (ref 150–400)
RBC: 4.27 MIL/uL (ref 3.87–5.11)
RDW: 12.6 % (ref 11.5–15.5)
WBC: 7.7 10*3/uL (ref 4.0–10.5)
nRBC: 0 % (ref 0.0–0.2)

## 2020-07-14 LAB — BASIC METABOLIC PANEL
Anion gap: 11 (ref 5–15)
BUN: 9 mg/dL (ref 6–20)
CO2: 23 mmol/L (ref 22–32)
Calcium: 9.1 mg/dL (ref 8.9–10.3)
Chloride: 106 mmol/L (ref 98–111)
Creatinine, Ser: 1.04 mg/dL — ABNORMAL HIGH (ref 0.44–1.00)
GFR, Estimated: >60 mL/min (ref >60)
Glucose, Bld: 101 mg/dL — ABNORMAL HIGH (ref 70–99)
Potassium: 3.2 mmol/L — ABNORMAL LOW (ref 3.5–5.1)
Sodium: 140 mmol/L (ref 135–145)

## 2020-07-14 LAB — I-STAT BETA HCG BLOOD, ED (MC, WL, AP ONLY): I-stat hCG, quantitative: <5 m[IU]/mL (ref <5)

## 2020-07-14 LAB — TROPONIN I (HIGH SENSITIVITY): Troponin I (High Sensitivity): 932 ng/L (ref <18)

## 2020-07-14 LAB — PROTIME-INR
INR: 1 (ref 0.8–1.2)
Prothrombin Time: 12.4 seconds (ref 11.4–15.2)

## 2020-07-14 MED ORDER — HEPARIN SODIUM (PORCINE) 5000 UNIT/ML IJ SOLN: 4000.0000 [IU] | Freq: Once | INTRAMUSCULAR | Status: DC

## 2020-07-14 MED ORDER — NITROGLYCERIN IN D5W 200-5 MCG/ML-% IV SOLN
0.0000 ug/min | INTRAVENOUS | Status: DC
  Administered 2020-07-15: 5 ug/min via INTRAVENOUS
  Filled 2020-07-14: qty 250

## 2020-07-14 MED ORDER — HEPARIN (PORCINE) 25000 UT/250ML-% IV SOLN: 12.0000 [IU]/kg/h | INTRAVENOUS | Status: DC

## 2020-07-14 MED ORDER — HEPARIN (PORCINE) 25000 UT/250ML-% IV SOLN
900.0000 [IU]/h | INTRAVENOUS | Status: DC
  Administered 2020-07-15: 900 [IU]/h via INTRAVENOUS
  Filled 2020-07-14: qty 250

## 2020-07-14 MED ORDER — HEPARIN BOLUS VIA INFUSION
4000.0000 [IU] | Freq: Once | INTRAVENOUS | Status: DC
  Filled 2020-07-14: qty 4000

## 2020-07-14 MED ORDER — ASPIRIN 81 MG PO CHEW
324.0000 mg | CHEWABLE_TABLET | Freq: Once | ORAL | Status: AC
  Administered 2020-07-15: 324 mg via ORAL
  Filled 2020-07-14: qty 4

## 2020-07-14 NOTE — ED Provider Notes (Signed)
[facility] EMERGENCY DEPARTMENT Provider Note   CSN: [id number] Arrival date & time: 07/14/20  2202     History Chief Complaint  Patient presents with  . Chest Pain    Wendy [name] is a 54 y.o. female with a history of tobacco abuse, hypertension, hyperlipidemia, asthma, & migraines who presents to the ED with complaints of chest pain that began yesterday.  Patient states the pain is a aching/pressure to the left chest that at times radiates into the jaw and the left upper extremity with some shortness of breath and tingling in the left hand (w/ bad episodes).  Yesterday when pain was really bad she did have some diaphoresis and nausea, none at present.  Her current discomfort is mild at a 5 out of 10 in severity.  She states discomfort has been waxing/waning since onset.  No specific alleviating or aggravating factors.  Denies emesis, dizziness, syncope, unilateral leg pain/swelling, cough, fever, or hemoptysis.  Patient has a family history of early CAD- father had MI in his 24s. She has never seen a cardiologist.   HPI     Past Medical History:  Diagnosis Date  . Anxiety   . Asthma    daily and prn inhalers  . Cervical disc disease    decreased range of motion  . Chronic pain    shoulders, neck, lower back  . Deformity, hand    left  . Depression   . Endometriosis    Lupron injection Q 3 mos.  . Fluid retention in legs   . Injury, brachial plexus    left  . Multiple allergies    takes allergy shots  . PONV (postoperative nausea and vomiting)   . 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 Wears partial dentures    upper partial    Patient Active Problem List   Diagnosis Date Noted  . BV (bacterial vaginosis) 03/06/2013  . ANXIETY 11/05/2007  . DEPRESSION 11/05/2007  . NAUSEA, CHRONIC 11/05/2007  . FIBROIDS, UTERUS 03/26/2007  . TOBACCO ABUSE 03/26/2007  . MIGRAINE, COMMON 03/26/2007  . OVARIAN CYST 03/26/2007  .  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 [place] DISEASE, LUMBOSACRAL SPINE 03/26/2007  . 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    Past Surgical History:  Procedure Laterality Date  . ANKLE RECONSTRUCTION  02/01/2012   Procedure: RECONSTRUCTION ANKLE;  Surgeon: Wylene Simmer, MD;  Location: [place];  Service: Orthopedics;  Laterality: Right;  Excision of right calcaneonavicular coalition with autologus fat graft interposition  . BRACHIAL PLEXUS EXPLORATION    . DIAGNOSTIC LAPAROSCOPY  10/02/2008   peritoneal bx.  . MULTIPLE TOOTH EXTRACTIONS     upper teeth and wisdom teeth  . OVARIAN CYST REMOVAL  2000  . RIB RESECTION     left - cervical rib removal  . SHOULDER ARTHROSCOPY W/ ROTATOR CUFF REPAIR  07/2011   right  . SHOULDER SURGERY     left  . TOOTH EXTRACTION     x 1     OB History    Gravida  0   Para  0   Term  0   Preterm  0   AB  0   Living  0     SAB  0   TAB  0   Ectopic  0   Multiple  0   Live Births              Family History  Problem Relation  Age of Onset  . Cancer Mother   . Dementia Father   . Stroke Father     Social History   Tobacco Use  . Smoking status: Current Every Day Smoker    Packs/day: 1.00    Years: 20.00    Pack years: 20.00    Types: Cigarettes  . Smokeless tobacco: Never Used  Vaping Use  . Vaping Use: Never used  Substance Use Topics  . Alcohol use: No    Alcohol/week: 0.0 standard drinks    Comment: occasional  . Drug use: No    Home Medications Prior to Admission medications   Medication Sig Start Date End Date Taking? Authorizing Provider  albuterol (PROVENTIL HFA;VENTOLIN HFA) 108 (90 Base) MCG/ACT inhaler Inhale 2 puffs into the lungs every 4 (four) hours as needed for wheezing or shortness of breath (cough). 11/20/16   Street, Mercedes, PA-C  amLODipine (NORVASC) 5 MG tablet Take 5 mg by mouth at bedtime. 12/09/19   [provider]  amoxicillin (AMOXIL) 875 MG  tablet Take 1 tablet (875 mg total) by mouth 2 (two) times daily. 12/07/19   Raylene Everts, MD  beclomethasone (QVAR) 80 MCG/ACT inhaler Inhale 1 puff into the lungs 2 (two) times daily as needed (sob/wheezing).     [provider]  benzonatate (TESSALON) 200 MG capsule Take 1 capsule (200 mg total) by mouth 2 (two) times daily as needed for cough. 12/07/19   Raylene Everts, MD  celecoxib (CELEBREX) 200 MG capsule Take 200 mg by mouth daily as needed for mild pain or moderate pain.  09/10/19   [provider]  divalproex (DEPAKOTE) 250 MG DR tablet Take 250 mg by mouth 2 (two) times daily. 10/10/19   [provider]  EPINEPHrine (EPIPEN JR) 0.15 MG/0.3ML injection Inject 0.15 mg into the muscle daily as needed for anaphylaxis.     [provider]  ergocalciferol (VITAMIN D2) 50000 UNITS capsule Take 50,000 Units by mouth once a week. Sundays    [provider]  escitalopram (LEXAPRO) 20 MG tablet Take 20 mg by mouth daily.     [provider]  furosemide (LASIX) 20 MG tablet Take 10 mg by mouth daily as needed for fluid. For swelling    [provider]  gabapentin (NEURONTIN) 400 MG capsule Take 400 mg by mouth 3 (three) times daily.     [provider]  HYDROcodone-acetaminophen (NORCO/VICODIN) 5-325 MG tablet Take 1 tablet by mouth every 6 (six) hours as needed for moderate pain or severe pain.    [provider]  hydrOXYzine (ATARAX/VISTARIL) 25 MG tablet Take 25 mg by mouth 3 (three) times daily as needed for anxiety or itching.  10/09/19   [provider]  lurasidone (LATUDA) 40 MG TABS tablet Take 40 mg by mouth daily with breakfast.    [provider]  nortriptyline (PAMELOR) 50 MG capsule Take 50 mg by mouth at bedtime.    [provider]  pantoprazole (PROTONIX) 40 MG tablet Take 40 mg by mouth daily as needed (heartburn).  11/19/19   [provider]  potassium chloride SA  (K-DUR,KLOR-CON) 20 MEQ tablet Take 20 mEq by mouth 2 (two) times daily as needed (when taking lasix).     [provider]  predniSONE (DELTASONE) 20 MG tablet Take 1 tablet (20 mg total) by mouth 2 (two) times daily with a meal. Patient not taking: Reported on 12/12/2019 12/07/19   Raylene Everts, MD  ranitidine (ZANTAC) 150  MG tablet Take 1 tablet (150 mg total) by mouth 2 (two) times daily. Patient taking differently: Take 150 mg by mouth 2 (two) times daily as needed for heartburn.  11/20/16   Street, Devola, PA-C  rosuvastatin (CRESTOR) 10 MG tablet Take 10 mg by mouth daily.    [provider]  tinidazole (TINDAMAX) 500 MG tablet Take 2 tablets (1,000 mg total) by mouth daily with breakfast. Patient not taking: Reported on 12/12/2019 11/03/19   Shelly Bombard, MD  valACYclovir (VALTREX) 1000 MG tablet Take 1 tablet po daily x 3 days prn for each outbreak. Patient taking differently: Take 1,000 mg by mouth 3 (three) times daily as needed (outbreak). Take 1 tablet po daily x 3 days prn for each outbreak. 11/03/19   Shelly Bombard, MD  valsartan-hydrochlorothiazide (DIOVAN-HCT) 160-12.5 MG tablet Take 1 tablet by mouth every morning. 12/08/19   [provider]  QUEtiapine (SEROQUEL) 100 MG tablet Take 100 mg by mouth at bedtime.   12/07/19  [provider]  sucralfate (CARAFATE) 1 GM/10ML suspension Take 10 mLs (1 g total) by mouth 4 (four) times daily -  with meals and at bedtime. Patient not taking: Reported on 04/11/2019 02/18/19 12/07/19  Caccavale, Jillyn Ledger, PA-C    Allergies    Pfizer-biontech covid-19 vacc [covid-19 mrna vacc (moderna)], Chocolate, Latex, Shellfish allergy, Fish-derived products, Peanut-containing drug products, and Soap  Review of Systems   Review of Systems  Constitutional: Positive for diaphoresis. Negative for chills and fever.  Respiratory: Positive for shortness of breath.   Cardiovascular: Positive for chest pain. Negative for  leg swelling.  Gastrointestinal: Positive for nausea. Negative for abdominal pain and vomiting.  Genitourinary: Negative for dysuria.  Neurological: Negative for syncope and weakness.       Positive for intermittent L hand paresthesias.   All other systems reviewed and are negative.   Physical Exam Updated Vital Signs BP 140/71 (BP Location: Left Arm)   Pulse 95   Temp 97.7 F (36.5 C) (Oral)   Resp 18   Ht 4\' 11"  (1.499 m)   Wt 92 kg   LMP 07/12/2016 (Approximate) Comment: 2-3 day cycle  SpO2 100%   BMI 40.97 kg/m   Physical Exam Vitals and nursing note reviewed.  Constitutional:      General: She is not in acute distress.    Appearance: She is well-developed. She is not toxic-appearing.  HENT:     Head: Normocephalic and atraumatic.  Eyes:     General:        Right eye: No discharge.        Left eye: No discharge.     Conjunctiva/sclera: Conjunctivae normal.  Cardiovascular:     Rate and Rhythm: Normal rate and regular rhythm.     Pulses:          Radial pulses are 2+ on the right side and 2+ on the left side.       Posterior tibial pulses are 2+ on the right side and 2+ on the left side.  Pulmonary:     Effort: Pulmonary effort is normal. No respiratory distress.     Breath sounds: Normal breath sounds. No wheezing, rhonchi or rales.  Abdominal:     General: There is no distension.     Palpations: Abdomen is soft.     Tenderness: There is no abdominal tenderness.  Musculoskeletal:     Cervical back: Neck supple.     Right lower leg: No tenderness. No edema.  Left lower leg: No tenderness. No edema.  Skin:    General: Skin is warm and dry.     Findings: No rash.  Neurological:     Mental Status: She is alert.     Comments: Clear speech.   Psychiatric:        Behavior: Behavior normal.     ED Results / Procedures / Treatments   Labs (all labs ordered are listed, but only abnormal results are displayed) Labs Reviewed  BASIC METABOLIC PANEL -  Abnormal; Notable for the following components:      Result Value   Potassium 3.2 (*)    Glucose, Bld 101 (*)    Creatinine, Ser 1.04 (*)    All other components within normal limits  TROPONIN I (HIGH SENSITIVITY) - Abnormal; Notable for the following components:   Troponin I (High Sensitivity) 932 (*)    All other components within normal limits  CBC  PROTIME-INR  I-STAT BETA HCG BLOOD, ED ([facility], [facility], [facility] ONLY)    EKG EKG Interpretation  Date/Time:  Wednesday July 14 2020 22:18:38 EDT Ventricular Rate:  90 PR Interval:  172 QRS Duration: 78 QT Interval:  344 QTC Calculation: 420 R Axis:   -10 Text Interpretation: Normal sinus rhythm non specific ST T changes Confirmed by Randal Buba, April ([id number]) on 07/14/2020 11:24:04 PM   Radiology DG Chest 2 View  Result Date: 07/14/2020 CLINICAL DATA:  Chest pain and shortness of breath for 2 days EXAM: CHEST - 2 VIEW COMPARISON:  12/12/2019 FINDINGS: The heart size and mediastinal contours are within normal limits. Both lungs are clear. The visualized skeletal structures are unremarkable. IMPRESSION: No active cardiopulmonary disease. Electronically Signed   By: Inez Catalina M.D.   On: 07/14/2020 22:44    Procedures .Critical Care Performed by: Amaryllis Dyke, PA-C Authorized by: Amaryllis Dyke, PA-C     CRITICAL CARE Performed by: Kennith Maes   Total critical care time: 45 minutes  Critical care time was exclusive of separately billable procedures and treating other patients.  Critical care was necessary to treat or prevent imminent or life-threatening deterioration.  Critical care was time spent personally by me on the following activities: development of treatment plan with patient and/or surrogate as well as nursing, discussions with consultants, evaluation of patient's response to treatment, examination of patient, obtaining history from patient or surrogate, ordering and performing treatments and  interventions, ordering and review of laboratory studies, ordering and review of radiographic studies, pulse oximetry and re-evaluation of patient's condition. (including critical care time)  Medications Ordered in ED Medications  nitroGLYCERIN 50 mg in dextrose 5 % 250 mL (0.2 mg/mL) infusion (5 mcg/min Intravenous New Bag/Given 07/15/20 0104)  heparin bolus via infusion 4,000 Units (0 Units Intravenous Hold 07/15/20 0145)  heparin ADULT infusion 100 units/mL (25000 units/275mL sodium chloride 0.45%) (900 Units/hr Intravenous New Bag/Given 07/15/20 0106)  aspirin chewable tablet 324 mg (324 mg Oral Given 07/15/20 0008)  nitroGLYCERIN (NITROSTAT) 0.4 MG SL tablet (0.4 mg  Given 07/15/20 0008)    ED Course  I have reviewed the triage vital signs and the nursing notes.  Pertinent labs & imaging results that were available during my care of the patient were reviewed by me and considered in my medical decision making (see chart for details).    MDM Rules/Calculators/A&P                          Patient presents to the ED  with complaints of chest pain.  Nontoxic, vitals without significant abnormality.  Fairly benign physical exam.  DDx: ACS, pulmonary embolism, dissection, pneumothorax, pneumonia, MSK, anxiety, arrhythmia, Boerhaave syndrome, GERD.  Additional history obtained:  Additional history obtained from chart review nursing note reviewed.  EKG: No STEMI, nonspecific ST/T wave changes. Lab Tests:  I reviewed and interpreted labs, which included:  CBC: Unremarkable BMP: Mild creatinine elevation.  Hypokalemia similar to prior. PT/INR within normal limits Pregnancy test: Negative Troponin: Grossly elevated at 932.  Imaging Studies ordered:  Chest x-ray ordered per triage protocol, I independently visualized and interpreted imaging which showed no acute process.   EKG with nonspecific ST/T changes, troponin grossly elevated @ 932- concern for NSTEMI. Current pain is 5/10 in  severity-start nitroglycerin drip, heparin, and aspirin.  Will discuss with cardiology.  23:45: CONSULT: Discussed with cardiology fellow Dr. Blossom Hoops- will see patient shortly.   Patient admitted to cardiology service.   Findings and plan of care discussed with supervising physician Dr. Randal Buba who is in agreement.   Portions of this note were generated with Lobbyist. Dictation errors may occur despite best attempts at proofreading.  Final Clinical Impression(s) / ED Diagnoses Final diagnoses:  NSTEMI (non-ST elevated myocardial infarction) [facility])    Rx / DC Orders ED Discharge Orders    None       Leafy Kindle 07/15/20 1308    Palumbo, April, MD 07/15/20 0250

## 2020-07-14 NOTE — ED Triage Notes (Signed)
Patient reports mid/left chest pain with SOB and nausea onset yesterday , no cough or fever .

## 2020-07-14 NOTE — Progress Notes (Signed)
ANTICOAGULATION CONSULT NOTE - Initial Consult  Pharmacy Consult for Heparin Indication: chest pain/ACS  Allergies  Allergen Reactions  . Pfizer-Biontech Covid-19 Vacc [Covid-19 Mrna Vacc (Moderna)] Shortness Of Breath and Swelling    Tightness in Chest  . Chocolate Hives  . Latex Hives, Itching and Swelling  . Shellfish Allergy   . Fish-Derived Products Nausea And Vomiting  . Peanut-Containing Drug Products Hives  . Soap Rash    Patient Measurements: Height: 4\' 11"  (149.9 cm) Weight: 92 kg (202 lb 13.2 oz) IBW/kg (Calculated) : 43.2  Vital Signs: Temp: 97.7 F (36.5 C) (11/03 2213) Temp Source: Oral (11/03 2213) BP: 131/76 (11/03 2330) Pulse Rate: 95 (11/03 2213)  Labs: Recent Labs    07/14/20 2225  HGB 12.4  HCT 39.3  PLT 236  LABPROT 12.4  INR 1.0  CREATININE 1.04*  TROPONINIHS 932*    Estimated Creatinine Clearance: 61.2 mL/min (A) (by C-G formula based on SCr of 1.04 mg/dL (H)).   Medical History: Past Medical History:  Diagnosis Date  . Anxiety   . Asthma    daily and prn inhalers  . Cervical disc disease    decreased range of motion  . Chronic pain    shoulders, neck, lower back  . Deformity, hand    left  . Depression   . Endometriosis    Lupron injection Q 3 mos.  . Fluid retention in legs   . Injury, brachial plexus    left  . Multiple allergies    takes allergy shots  . PONV (postoperative nausea and vomiting)   . 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 Wears partial dentures    upper partial     Assessment: 54 y/o F with chest pain. Mildly elevated troponin. Starting heparin. PTA meds reviewed. CBC/renal function ok.   Goal of Therapy:  Heparin level 0.3-0.7 units/ml Monitor platelets by anticoagulation protocol: Yes   Plan:  Heparin 4000 units BOLUS Start heparin drip at 900 units/hr 0900 heparin level Daily CBC/heparin level Monitor for bleeding  Narda Bonds, PharmD, BCPS Clinical  Pharmacist Phone: [phone]

## 2020-07-15 ENCOUNTER — Encounter (HOSPITAL_COMMUNITY)
Admission: EM | Disposition: A | Discharge status: Home / Self Care | Payer: Self-pay | Source: Home / Self Care | Attending: Cardiology

## 2020-07-15 ENCOUNTER — Other Ambulatory Visit (HOSPITAL_COMMUNITY): Payer: Medicare Other

## 2020-07-15 DIAGNOSIS — F32A Depression, unspecified: Secondary | ICD-10-CM | POA: Diagnosis not present

## 2020-07-15 DIAGNOSIS — Z823 Family history of stroke: Secondary | ICD-10-CM | POA: Diagnosis not present

## 2020-07-15 DIAGNOSIS — J45909 Unspecified asthma, uncomplicated: Secondary | ICD-10-CM | POA: Diagnosis not present

## 2020-07-15 DIAGNOSIS — I214 Non-ST elevation (NSTEMI) myocardial infarction: Secondary | ICD-10-CM

## 2020-07-15 DIAGNOSIS — I2510 Atherosclerotic heart disease of native coronary artery without angina pectoris: Secondary | ICD-10-CM

## 2020-07-15 DIAGNOSIS — I351 Nonrheumatic aortic (valve) insufficiency: Secondary | ICD-10-CM | POA: Diagnosis not present

## 2020-07-15 DIAGNOSIS — E876 Hypokalemia: Secondary | ICD-10-CM

## 2020-07-15 DIAGNOSIS — I361 Nonrheumatic tricuspid (valve) insufficiency: Secondary | ICD-10-CM | POA: Diagnosis not present

## 2020-07-15 DIAGNOSIS — I340 Nonrheumatic mitral (valve) insufficiency: Secondary | ICD-10-CM | POA: Diagnosis not present

## 2020-07-15 DIAGNOSIS — Z720 Tobacco use: Secondary | ICD-10-CM

## 2020-07-15 DIAGNOSIS — I10 Essential (primary) hypertension: Secondary | ICD-10-CM

## 2020-07-15 DIAGNOSIS — Z6841 Body Mass Index (BMI) 40.0 and over, adult: Secondary | ICD-10-CM | POA: Diagnosis not present

## 2020-07-15 DIAGNOSIS — Z8249 Family history of ischemic heart disease and other diseases of the circulatory system: Secondary | ICD-10-CM | POA: Diagnosis not present

## 2020-07-15 DIAGNOSIS — Z79899 Other long term (current) drug therapy: Secondary | ICD-10-CM | POA: Diagnosis not present

## 2020-07-15 DIAGNOSIS — Z887 Allergy status to serum and vaccine status: Secondary | ICD-10-CM | POA: Diagnosis not present

## 2020-07-15 DIAGNOSIS — Z20822 Contact with and (suspected) exposure to covid-19: Secondary | ICD-10-CM | POA: Diagnosis present

## 2020-07-15 DIAGNOSIS — F419 Anxiety disorder, unspecified: Secondary | ICD-10-CM | POA: Diagnosis present

## 2020-07-15 DIAGNOSIS — E785 Hyperlipidemia, unspecified: Secondary | ICD-10-CM | POA: Diagnosis not present

## 2020-07-15 DIAGNOSIS — Z91040 Latex allergy status: Secondary | ICD-10-CM | POA: Diagnosis not present

## 2020-07-15 DIAGNOSIS — F17200 Nicotine dependence, unspecified, uncomplicated: Secondary | ICD-10-CM | POA: Diagnosis not present

## 2020-07-15 HISTORY — PX: CORONARY STENT INTERVENTION: CATH118234

## 2020-07-15 HISTORY — PX: LEFT HEART CATH AND CORONARY ANGIOGRAPHY: CATH118249

## 2020-07-15 LAB — RESPIRATORY PANEL BY RT PCR (FLU A&B, COVID)
Influenza A by PCR: NEGATIVE
Influenza B by PCR: NEGATIVE
SARS Coronavirus 2 by RT PCR: NEGATIVE

## 2020-07-15 LAB — CBC
HCT: 39.9 % (ref 36.0–46.0)
Hemoglobin: 12.9 g/dL (ref 12.0–15.0)
MCH: 29 pg (ref 26.0–34.0)
MCHC: 32.3 g/dL (ref 30.0–36.0)
MCV: 89.7 fL (ref 80.0–100.0)
Platelets: 232 10*3/uL (ref 150–400)
RBC: 4.45 MIL/uL (ref 3.87–5.11)
RDW: 12.7 % (ref 11.5–15.5)
WBC: 6.6 10*3/uL (ref 4.0–10.5)
nRBC: 0 % (ref 0.0–0.2)

## 2020-07-15 LAB — PROTIME-INR
INR: 1.1 (ref 0.8–1.2)
Prothrombin Time: 13.5 seconds (ref 11.4–15.2)

## 2020-07-15 LAB — TSH: TSH: 0.618 u[IU]/mL (ref 0.350–4.500)

## 2020-07-15 LAB — HEPARIN LEVEL (UNFRACTIONATED): Heparin Unfractionated: 0.31 IU/mL (ref 0.30–0.70)

## 2020-07-15 LAB — LIPID PANEL
Cholesterol: 248 mg/dL — ABNORMAL HIGH (ref 0–200)
HDL: 80 mg/dL (ref >40)
LDL Cholesterol: 159 mg/dL — ABNORMAL HIGH (ref 0–99)
Total CHOL/HDL Ratio: 3.1 RATIO
Triglycerides: 46 mg/dL (ref <150)
VLDL: 9 mg/dL (ref 0–40)

## 2020-07-15 LAB — POCT ACTIVATED CLOTTING TIME
Activated Clotting Time: 274 seconds
Activated Clotting Time: 461 seconds
Activated Clotting Time: 191 seconds
Activated Clotting Time: 147 seconds

## 2020-07-15 LAB — HEMOGLOBIN A1C
Hgb A1c MFr Bld: 5 % (ref 4.8–5.6)
Mean Plasma Glucose: 96.8 mg/dL

## 2020-07-15 LAB — BRAIN NATRIURETIC PEPTIDE
B Natriuretic Peptide: 62.2 pg/mL (ref 0.0–100.0)
B Natriuretic Peptide: 135.9 pg/mL — ABNORMAL HIGH (ref 0.0–100.0)

## 2020-07-15 LAB — TROPONIN I (HIGH SENSITIVITY): Troponin I (High Sensitivity): 1251 ng/L (ref <18)

## 2020-07-15 SURGERY — LEFT HEART CATH AND CORONARY ANGIOGRAPHY
Anesthesia: LOCAL

## 2020-07-15 MED ORDER — SODIUM CHLORIDE 0.9 % WEIGHT BASED INFUSION: 3.0000 mL/kg/h | INTRAVENOUS | Status: DC

## 2020-07-15 MED ORDER — SODIUM CHLORIDE 0.9% FLUSH: 3.0000 mL | INTRAVENOUS | Status: DC | PRN

## 2020-07-15 MED ORDER — DIPHENHYDRAMINE HCL 25 MG PO CAPS: 50.0000 mg | ORAL_CAPSULE | Freq: Once | ORAL | Status: AC

## 2020-07-15 MED ORDER — VERAPAMIL HCL 2.5 MG/ML IV SOLN
INTRAVENOUS | Status: DC | PRN
  Administered 2020-07-15: 10 mL via INTRA_ARTERIAL

## 2020-07-15 MED ORDER — HEPARIN (PORCINE) IN NACL 1000-0.9 UT/500ML-% IV SOLN
INTRAVENOUS | Status: DC | PRN
  Administered 2020-07-15 (×2): 500 mL

## 2020-07-15 MED ORDER — ONDANSETRON HCL 4 MG/2ML IJ SOLN: 4.0000 mg | Freq: Four times a day (QID) | INTRAMUSCULAR | Status: DC | PRN

## 2020-07-15 MED ORDER — PANTOPRAZOLE SODIUM 40 MG PO TBEC: 40.0000 mg | DELAYED_RELEASE_TABLET | Freq: Every day | ORAL | Status: DC | PRN

## 2020-07-15 MED ORDER — FENTANYL CITRATE (PF) 100 MCG/2ML IJ SOLN
INTRAMUSCULAR | Status: DC | PRN
  Administered 2020-07-15 (×2): 25 ug via INTRAVENOUS

## 2020-07-15 MED ORDER — ASPIRIN 81 MG PO CHEW: 81.0000 mg | CHEWABLE_TABLET | Freq: Every day | ORAL | Status: DC

## 2020-07-15 MED ORDER — NITROGLYCERIN 0.4 MG SL SUBL: 0.4000 mg | SUBLINGUAL_TABLET | SUBLINGUAL | Status: DC | PRN

## 2020-07-15 MED ORDER — METHYLPREDNISOLONE SODIUM SUCC 125 MG IJ SOLR
125.0000 mg | Freq: Once | INTRAMUSCULAR | Status: AC
  Administered 2020-07-15: 125 mg via INTRAVENOUS
  Filled 2020-07-15: qty 2

## 2020-07-15 MED ORDER — LABETALOL HCL 5 MG/ML IV SOLN: 10.0000 mg | INTRAVENOUS | Status: AC | PRN

## 2020-07-15 MED ORDER — MIDAZOLAM HCL 2 MG/2ML IJ SOLN
INTRAMUSCULAR | Status: AC
  Filled 2020-07-15: qty 2

## 2020-07-15 MED ORDER — SODIUM CHLORIDE 0.9 % IV SOLN: 250.0000 mL | INTRAVENOUS | Status: DC | PRN

## 2020-07-15 MED ORDER — ASPIRIN EC 81 MG PO TBEC: 81.0000 mg | DELAYED_RELEASE_TABLET | Freq: Every day | ORAL | Status: DC

## 2020-07-15 MED ORDER — POTASSIUM CHLORIDE CRYS ER 20 MEQ PO TBCR
20.0000 meq | EXTENDED_RELEASE_TABLET | Freq: Two times a day (BID) | ORAL | Status: DC
  Administered 2020-07-15: 20 meq via ORAL
  Filled 2020-07-15: qty 1

## 2020-07-15 MED ORDER — HEPARIN SODIUM (PORCINE) 1000 UNIT/ML IJ SOLN
INTRAMUSCULAR | Status: DC | PRN
  Administered 2020-07-15: 10000 [IU] via INTRAVENOUS
  Administered 2020-07-15: 2000 [IU] via INTRAVENOUS

## 2020-07-15 MED ORDER — ACETAMINOPHEN 325 MG PO TABS
650.0000 mg | ORAL_TABLET | ORAL | Status: DC | PRN
  Administered 2020-07-15 – 2020-07-16 (×2): 650 mg via ORAL
  Filled 2020-07-15 (×2): qty 2

## 2020-07-15 MED ORDER — HYDROMORPHONE HCL 1 MG/ML IJ SOLN
INTRAMUSCULAR | Status: AC
Start: 2020-07-15 — End: ?
  Filled 2020-07-15: qty 0.5

## 2020-07-15 MED ORDER — ROSUVASTATIN CALCIUM 20 MG PO TABS: 40.0000 mg | ORAL_TABLET | Freq: Every day | ORAL | Status: DC

## 2020-07-15 MED ORDER — POTASSIUM CHLORIDE CRYS ER 20 MEQ PO TBCR: 40.0000 meq | EXTENDED_RELEASE_TABLET | Freq: Once | ORAL | Status: DC

## 2020-07-15 MED ORDER — HYDROXYZINE HCL 25 MG PO TABS: 25.0000 mg | ORAL_TABLET | Freq: Three times a day (TID) | ORAL | Status: DC | PRN

## 2020-07-15 MED ORDER — MIDAZOLAM HCL 2 MG/2ML IJ SOLN
INTRAMUSCULAR | Status: DC | PRN
  Administered 2020-07-15: 1 mg via INTRAVENOUS
  Administered 2020-07-15: 2 mg via INTRAVENOUS

## 2020-07-15 MED ORDER — SODIUM CHLORIDE 0.9% FLUSH: 3.0000 mL | Freq: Two times a day (BID) | INTRAVENOUS | Status: DC

## 2020-07-15 MED ORDER — SODIUM CHLORIDE 0.9 % IV SOLN: INTRAVENOUS | Status: AC

## 2020-07-15 MED ORDER — HEPARIN SODIUM (PORCINE) 5000 UNIT/ML IJ SOLN
INTRAMUSCULAR | Status: AC
  Administered 2020-07-15: 4000 [IU] via INTRAVENOUS
  Filled 2020-07-15: qty 1

## 2020-07-15 MED ORDER — NITROGLYCERIN 0.4 MG SL SUBL
SUBLINGUAL_TABLET | SUBLINGUAL | Status: AC
  Administered 2020-07-15: 0.4 mg
  Filled 2020-07-15: qty 3

## 2020-07-15 MED ORDER — TICAGRELOR 90 MG PO TABS
ORAL_TABLET | ORAL | Status: AC
  Filled 2020-07-15: qty 2

## 2020-07-15 MED ORDER — ACETAMINOPHEN 325 MG PO TABS: 650.0000 mg | ORAL_TABLET | ORAL | Status: DC | PRN

## 2020-07-15 MED ORDER — TICAGRELOR 90 MG PO TABS
90.0000 mg | ORAL_TABLET | Freq: Two times a day (BID) | ORAL | Status: DC
  Administered 2020-07-16 (×2): 90 mg via ORAL
  Filled 2020-07-15 (×2): qty 1

## 2020-07-15 MED ORDER — NITROGLYCERIN 1 MG/10 ML FOR IR/CATH LAB
INTRA_ARTERIAL | Status: AC
  Filled 2020-07-15: qty 10

## 2020-07-15 MED ORDER — DIPHENHYDRAMINE HCL 50 MG/ML IJ SOLN
50.0000 mg | Freq: Once | INTRAMUSCULAR | Status: AC
  Administered 2020-07-15: 50 mg via INTRAVENOUS
  Filled 2020-07-15: qty 1

## 2020-07-15 MED ORDER — HYDROMORPHONE HCL 1 MG/ML IJ SOLN
INTRAMUSCULAR | Status: DC | PRN
  Administered 2020-07-15: 0.5 mg via INTRAVENOUS

## 2020-07-15 MED ORDER — HEPARIN SODIUM (PORCINE) 1000 UNIT/ML IJ SOLN
INTRAMUSCULAR | Status: AC
  Filled 2020-07-15: qty 1

## 2020-07-15 MED ORDER — TICAGRELOR 90 MG PO TABS
ORAL_TABLET | ORAL | Status: DC | PRN
  Administered 2020-07-15: 180 mg via ORAL

## 2020-07-15 MED ORDER — ATROPINE SULFATE 1 MG/10ML IJ SOSY
PREFILLED_SYRINGE | INTRAMUSCULAR | Status: AC
  Filled 2020-07-15: qty 10

## 2020-07-15 MED ORDER — VERAPAMIL HCL 2.5 MG/ML IV SOLN
INTRAVENOUS | Status: AC
  Filled 2020-07-15: qty 2

## 2020-07-15 MED ORDER — NITROGLYCERIN 1 MG/10 ML FOR IR/CATH LAB
INTRA_ARTERIAL | Status: DC | PRN
  Administered 2020-07-15 (×3): 200 ug via INTRACORONARY

## 2020-07-15 MED ORDER — ROSUVASTATIN CALCIUM 20 MG PO TABS
40.0000 mg | ORAL_TABLET | Freq: Every day | ORAL | Status: DC
  Administered 2020-07-15 – 2020-07-16 (×2): 40 mg via ORAL
  Filled 2020-07-15 (×2): qty 2

## 2020-07-15 MED ORDER — FENTANYL CITRATE (PF) 100 MCG/2ML IJ SOLN
INTRAMUSCULAR | Status: AC
  Filled 2020-07-15: qty 2

## 2020-07-15 MED ORDER — LIDOCAINE HCL (PF) 1 % IJ SOLN
INTRAMUSCULAR | Status: DC | PRN
  Administered 2020-07-15: 2 mL

## 2020-07-15 MED ORDER — LIDOCAINE HCL (PF) 1 % IJ SOLN
INTRAMUSCULAR | Status: AC
  Filled 2020-07-15: qty 30

## 2020-07-15 MED ORDER — METOPROLOL TARTRATE 12.5 MG HALF TABLET
12.5000 mg | ORAL_TABLET | Freq: Two times a day (BID) | ORAL | Status: DC
  Administered 2020-07-15 – 2020-07-16 (×2): 12.5 mg via ORAL
  Filled 2020-07-15 (×2): qty 1

## 2020-07-15 MED ORDER — HEPARIN (PORCINE) IN NACL 1000-0.9 UT/500ML-% IV SOLN
INTRAVENOUS | Status: AC
  Filled 2020-07-15: qty 1000

## 2020-07-15 MED ORDER — SODIUM CHLORIDE 0.9% FLUSH
3.0000 mL | Freq: Two times a day (BID) | INTRAVENOUS | Status: DC
  Administered 2020-07-15 – 2020-07-16 (×2): 3 mL via INTRAVENOUS

## 2020-07-15 MED ORDER — SODIUM CHLORIDE 0.9 % WEIGHT BASED INFUSION: 1.0000 mL/kg/h | INTRAVENOUS | Status: DC

## 2020-07-15 MED ORDER — ASPIRIN EC 81 MG PO TBEC
81.0000 mg | DELAYED_RELEASE_TABLET | Freq: Every day | ORAL | Status: DC
  Administered 2020-07-15 – 2020-07-16 (×2): 81 mg via ORAL
  Filled 2020-07-15 (×2): qty 1

## 2020-07-15 MED ORDER — HYDRALAZINE HCL 20 MG/ML IJ SOLN: 10.0000 mg | INTRAMUSCULAR | Status: AC | PRN

## 2020-07-15 SURGICAL SUPPLY — 25 items
BALLN SAPPHIRE 2.5X15 (BALLOONS) ×2
BALLN SAPPHIRE ~~LOC~~ 3.0X15 (BALLOONS) ×1 IMPLANT
BALLN SAPPHIRE ~~LOC~~ 3.25X12 (BALLOONS) ×1 IMPLANT
BALLOON SAPPHIRE 2.5X15 (BALLOONS) IMPLANT
CATH 5FR JL3.5 JR4 ANG PIG MP (CATHETERS) ×1 IMPLANT
CATH INFINITI 4FR JL3.5 (CATHETERS) ×1 IMPLANT
CATH INFINITI MULTIPACK ANG 4F (CATHETERS) ×1 IMPLANT
CATH LAUNCHER 6FR EBU 3.75 (CATHETERS) ×1 IMPLANT
CATH LAUNCHER 6FR JR4 (CATHETERS) ×1 IMPLANT
DEVICE RAD COMP TR BAND LRG (VASCULAR PRODUCTS) ×1 IMPLANT
GLIDESHEATH SLEND SS 6F .021 (SHEATH) ×1 IMPLANT
GUIDEWIRE INQWIRE 1.5J.035X260 (WIRE) IMPLANT
INQWIRE 1.5J .035X260CM (WIRE) ×2
KIT ENCORE 26 ADVANTAGE (KITS) ×1 IMPLANT
KIT HEART LEFT (KITS) ×2 IMPLANT
KIT HEMO VALVE WATCHDOG (MISCELLANEOUS) ×1 IMPLANT
PACK CARDIAC CATHETERIZATION (CUSTOM PROCEDURE TRAY) ×2 IMPLANT
SHEATH PINNACLE 6F 10CM (SHEATH) ×1 IMPLANT
SHEATH PROBE COVER 6X72 (BAG) ×1 IMPLANT
STENT RESOLUTE ONYX 2.5X18 (Permanent Stent) ×1 IMPLANT
STENT RESOLUTE ONYX 3.0X15 (Permanent Stent) ×1 IMPLANT
TRANSDUCER W/STOPCOCK (MISCELLANEOUS) ×2 IMPLANT
TUBING CIL FLEX 10 FLL-RA (TUBING) ×2 IMPLANT
WIRE ASAHI PROWATER 180CM (WIRE) ×1 IMPLANT
WIRE EMERALD 3MM-J .035X150CM (WIRE) ×1 IMPLANT

## 2020-07-15 NOTE — ED Notes (Signed)
Pt ambulated to bathroom -- states pain is 1/10. Brother is at bedside at present

## 2020-07-15 NOTE — ED Notes (Signed)
Niece Luetta Nutting Erick Blinks) would like an update [phone]

## 2020-07-15 NOTE — Progress Notes (Signed)
Site area: right groin  Site Prior to Removal:  Level 0  Pressure Applied For 20 MINUTES    Minutes Beginning at 2157  Manual:   Yes.    Patient Status During Pull:  Pt.is alert ,V/S stable  Post Pull Groin Site:  Level 0  Post Pull Instructions Given:  Yes.    Post Pull Pulses Present:  Yes.    Dressing Applied:  Yes.    Comments:  Pt.tolerated procedure well

## 2020-07-15 NOTE — ED Notes (Signed)
Consent signed for cardiac catheterization and angiography.

## 2020-07-15 NOTE — Interval H&P Note (Signed)
Cath Lab Visit (complete for each Cath Lab visit)  Clinical Evaluation Leading to the Procedure:   ACS: Yes.    Non-ACS:    Anginal Classification: CCS IV  Anti-ischemic medical therapy: Minimal Therapy (1 class of medications)  Non-Invasive Test Results: No non-invasive testing performed  Prior CABG: No previous CABG      History and Physical Interval Note:  07/15/2020 1:58 PM  [name]  has presented today for surgery, with the diagnosis of nonstemi.  The various methods of treatment have been discussed with the patient and family. After consideration of risks, benefits and other options for treatment, the patient has consented to  Procedure(s): LEFT HEART CATH AND CORONARY ANGIOGRAPHY (N/A) as a surgical intervention.  The patient's history has been reviewed, patient examined, no change in status, stable for surgery.  I have reviewed the patient's chart and labs.  Questions were answered to the patient's satisfaction.     Larae Grooms

## 2020-07-15 NOTE — H&P (Signed)
Cardiology History & Physical    Patient ID: Wendy [name] MRN: [id number], DOB/AGE: October 06, 1965   Admit date: 07/14/2020  Primary Physician: Lucianne Lei, MD Primary Cardiologist: No primary care provider on file.  Patient Profile    54 year old female with a history of hypertension, hyperlipidemia, depression, tobacco use who presents with chest pain elevated troponin consistent with NSTEMI.  History of Present Illness    This is a 54 year old female smoker with hypertension and hyperlipidemia who presents with chest pain.  Other risk factors include a family history of premature coronary artery disease.  She reports that for the last month she has been having chest pain that was substernal and going up into her neck.  She had gone to her primary care provider who thought her pain was likely noncardiac but she found that over the last week or so, and particularly today she was having worsening chest pain and pressure with associated dyspnea that was worse with exertion relieved by rest.  Given the crescendo nature of her pain she decided to present to the ED today.  Denies palpitations or orthopnea or PND.  No cough, fevers, chills.  No recent swollen red hot leg.  She is not dyspneic at rest.  Pain is not pleuritic.  After my evaluation she received a sublingual nitroglycerin with resolution of her chest pain from around 5/10.  ECG reviewed with normal sinus rhythm with borderline left axis deviation and poor R wave progression nonspecific ST changes.  Prior ECG reviewed and this is unchanged from last in April.  Initial troponin 932.  Past Medical History   Past Medical History:  Diagnosis Date  . Anxiety   . Asthma    daily and prn inhalers  . Cervical disc disease    decreased range of motion  . Chronic pain    shoulders, neck, lower back  . Deformity, hand    left  . Depression   . Endometriosis    Lupron injection Q 3 mos.  . Fluid retention in legs   . Injury,  brachial plexus    left  . Multiple allergies    takes allergy shots  . PONV (postoperative nausea and vomiting)   . 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 Wears partial dentures    upper partial    Past Surgical History:  Procedure Laterality Date  . ANKLE RECONSTRUCTION  02/01/2012   Procedure: RECONSTRUCTION ANKLE;  Surgeon: Wylene Simmer, MD;  Location: [facility];  Service: Orthopedics;  Laterality: Right;  Excision of right calcaneonavicular coalition with autologus fat graft interposition  . BRACHIAL PLEXUS EXPLORATION    . DIAGNOSTIC LAPAROSCOPY  10/02/2008   peritoneal bx.  . MULTIPLE TOOTH EXTRACTIONS     upper teeth and wisdom teeth  . OVARIAN CYST REMOVAL  2000  . RIB RESECTION     left - cervical rib removal  . SHOULDER ARTHROSCOPY W/ ROTATOR CUFF REPAIR  07/2011   right  . SHOULDER SURGERY     left  . TOOTH EXTRACTION     x 1     Allergies Allergies  Allergen Reactions  . Pfizer-Biontech Covid-19 Vacc [Covid-19 Mrna Vacc (Moderna)] Shortness Of Breath and Swelling    Tightness in Chest  . Chocolate Hives  . Latex Hives, Itching and Swelling  . Shellfish Allergy   . Fish-Derived Products Nausea And Vomiting  . Peanut-Containing Drug Products Hives  . Soap Rash    Home Medications  Prior to Admission medications   Medication Sig Start Date End Date Taking? Authorizing Provider  acetaminophen (TYLENOL) 500 MG tablet Take 500 mg by mouth every 6 (six) hours as needed for moderate pain.   Yes [provider]  albuterol (PROVENTIL HFA;VENTOLIN HFA) 108 (90 Base) MCG/ACT inhaler Inhale 2 puffs into the lungs every 4 (four) hours as needed for wheezing or shortness of breath (cough). 11/20/16  Yes Street, [place], PA-C  beclomethasone (QVAR) 80 MCG/ACT inhaler Inhale 1 puff into the lungs 2 (two) times daily as needed (sob/wheezing).    Yes [provider]  bismuth subsalicylate (PEPTO BISMOL) 262 MG/15ML  suspension Take 30 mLs by mouth every 6 (six) hours as needed for indigestion.   Yes [provider]  calcium carbonate (TUMS - DOSED IN MG ELEMENTAL CALCIUM) 500 MG chewable tablet Chew 1 tablet by mouth daily as needed for indigestion or heartburn.   Yes [provider]  EPINEPHrine (EPIPEN JR) 0.15 MG/0.3ML injection Inject 0.15 mg into the muscle daily as needed for anaphylaxis.    Yes [provider]  ergocalciferol (VITAMIN D2) 50000 UNITS capsule Take 50,000 Units by mouth once a week. Sundays   Yes [provider]  furosemide (LASIX) 20 MG tablet Take 10 mg by mouth daily as needed for fluid. For swelling   Yes [provider]  hydrOXYzine (ATARAX/VISTARIL) 25 MG tablet Take 25 mg by mouth 3 (three) times daily as needed for anxiety or itching.  10/09/19  Yes [provider]  meloxicam (MOBIC) 15 MG tablet Take 15 mg by mouth daily as needed for pain.   Yes [provider]  pantoprazole (PROTONIX) 40 MG tablet Take 40 mg by mouth daily as needed (heartburn).  11/19/19  Yes [provider]  potassium chloride SA (K-DUR,KLOR-CON) 20 MEQ tablet Take 20 mEq by mouth 2 (two) times daily.    Yes [provider]  amLODipine (NORVASC) 5 MG tablet Take 5 mg by mouth at bedtime. 12/09/19   [provider]  amoxicillin (AMOXIL) 875 MG tablet Take 1 tablet (875 mg total) by mouth 2 (two) times daily. Patient not taking: Reported on 07/14/2020 12/07/19   Raylene Everts, MD  benzonatate (TESSALON) 200 MG capsule Take 1 capsule (200 mg total) by mouth 2 (two) times daily as needed for cough. Patient not taking: Reported on 07/14/2020 12/07/19   Raylene Everts, MD  celecoxib (CELEBREX) 200 MG capsule Take 200 mg by mouth daily as needed for mild pain or moderate pain.  09/10/19   [provider]  divalproex (DEPAKOTE) 250 MG DR tablet Take 250 mg by mouth 2 (two) times daily. 10/10/19   [provider]   escitalopram (LEXAPRO) 20 MG tablet Take 20 mg by mouth daily.     [provider]  gabapentin (NEURONTIN) 600 MG tablet Take 600 mg by mouth at bedtime.     [provider]  lurasidone (LATUDA) 40 MG TABS tablet Take 40 mg by mouth daily with breakfast.    [provider]  nortriptyline (PAMELOR) 50 MG capsule Take 50 mg by mouth at bedtime.    [provider]  predniSONE (DELTASONE) 20 MG tablet Take 1 tablet (20 mg total) by mouth 2 (two) times daily with a meal. Patient not taking: Reported on 12/12/2019 12/07/19   Raylene Everts, MD  ranitidine (ZANTAC) 150 MG tablet Take 1 tablet (150 mg total) by mouth 2 (two) times daily. Patient not taking: Reported on  07/14/2020 11/20/16   Street, [place], PA-C  rosuvastatin (CRESTOR) 10 MG tablet Take 10 mg by mouth daily.    [provider]  tinidazole (TINDAMAX) 500 MG tablet Take 2 tablets (1,000 mg total) by mouth daily with breakfast. Patient not taking: Reported on 12/12/2019 11/03/19   Shelly Bombard, MD  valACYclovir (VALTREX) 1000 MG tablet Take 1 tablet po daily x 3 days prn for each outbreak. Patient not taking: Reported on 07/14/2020 11/03/19   Shelly Bombard, MD  valsartan-hydrochlorothiazide (DIOVAN-HCT) 160-12.5 MG tablet Take 1 tablet by mouth every morning. 12/08/19   [provider]  QUEtiapine (SEROQUEL) 100 MG tablet Take 100 mg by mouth at bedtime.   12/07/19  [provider]  sucralfate (CARAFATE) 1 GM/10ML suspension Take 10 mLs (1 g total) by mouth 4 (four) times daily -  with meals and at bedtime. Patient not taking: Reported on 04/11/2019 02/18/19 12/07/19  Franchot Heidelberg, PA-C    Family History    Family History  Problem Relation Age of Onset  . Cancer Mother   . Dementia Father   . Stroke Father    She indicated that her mother is deceased. She indicated that her father is alive.   Social History    Social History   Socioeconomic History  . Marital  status: Single    Spouse name: Not on file  . Number of children: Not on file  . Years of education: Not on file  . Highest education level: Not on file  Occupational History  . Not on file  Tobacco Use  . Smoking status: Current Every Day Smoker    Packs/day: 1.00    Years: 20.00    Pack years: 20.00    Types: Cigarettes  . Smokeless tobacco: Never Used  Vaping Use  . Vaping Use: Never used  Substance and Sexual Activity  . Alcohol use: No    Alcohol/week: 0.0 standard drinks    Comment: occasional  . Drug use: No  . Sexual activity: Yes    Partners: Male    Birth control/protection: None  Other Topics Concern  . Not on file  Social History Narrative  . Not on file   Social Determinants of Health   Financial Resource Strain:   . Difficulty of Paying Living Expenses: Not on file  Food Insecurity:   . Worried About Charity fundraiser in the Last Year: Not on file  . Ran Out of Food in the Last Year: Not on file  Transportation Needs:   . Lack of Transportation (Medical): Not on file  . Lack of Transportation (Non-Medical): Not on file  Physical Activity:   . Days of Exercise per Week: Not on file  . Minutes of Exercise per Session: Not on file  Stress:   . Feeling of Stress : Not on file  Social Connections:   . Frequency of Communication with Friends and Family: Not on file  . Frequency of Social Gatherings with Friends and Family: Not on file  . Attends Religious Services: Not on file  . Active Member of Clubs or Organizations: Not on file  . Attends Archivist Meetings: Not on file  . Marital Status: Not on file  Intimate Partner Violence:   . Fear of Current or Ex-Partner: Not on file  . Emotionally Abused: Not on file  . Physically Abused: Not on file  . Sexually Abused: Not on file     Review of Systems    General:  No chills, fever, night sweats or weight changes.  Cardiovascular:  No chest pain, dyspnea on exertion, edema, orthopnea,  palpitations, paroxysmal nocturnal dyspnea. Dermatological: No rash, lesions/masses Respiratory: No cough, dyspnea Urologic: No hematuria, dysuria Abdominal:   No nausea, vomiting, diarrhea, bright red blood per rectum, melena, or hematemesis Neurologic:  No visual changes, wkns, changes in mental status. All other systems reviewed and are otherwise negative except as noted above.  Physical Exam    BP 124/61 (BP Location: Left Arm)   Pulse 95   Temp 97.7 F (36.5 C) (Oral)   Resp 18   Ht 4\' 11"  (1.499 m)   Wt 92 kg   LMP 07/12/2016 (Approximate) Comment: 2-3 day cycle  SpO2 100%   BMI 40.97 kg/m  General: Alert, NAD HEENT: Normal  Neck: No bruits or JVD. Lungs:  Resp regular and unlabored, CTA bilaterally. Heart: Regular rhythm, no s3, s4, or murmurs. Abdomen: Soft, non-tender, non-distended, BS +.  Extremities: Warm. No clubbing, cyanosis or edema. DP/PT/Radials 2+ and equal bilaterally. Psych: Normal affect. Neuro: Alert and oriented. No gross focal deficits. No abnormal movements.  Labs    Troponin (Point of Care Test) No results for input(s): TROPIPOC in the last 72 hours. No results for input(s): CKTOTAL, CKMB, TROPONINI in the last 72 hours. Lab Results  Component Value Date   WBC 7.7 07/14/2020   HGB 12.4 07/14/2020   HCT 39.3 07/14/2020   MCV 92.0 07/14/2020   PLT 236 07/14/2020    Recent Labs  Lab 07/14/20 2225  NA 140  K 3.2*  CL 106  CO2 23  BUN 9  CREATININE 1.04*  CALCIUM 9.1  GLUCOSE 101*   No results found for: CHOL, HDL, LDLCALC, TRIG No results found for: [facility]   Radiology Studies    DG Chest 2 View  Result Date: 07/14/2020 CLINICAL DATA:  Chest pain and shortness of breath for 2 days EXAM: CHEST - 2 VIEW COMPARISON:  12/12/2019 FINDINGS: The heart size and mediastinal contours are within normal limits. Both lungs are clear. The visualized skeletal structures are unremarkable. IMPRESSION: No active cardiopulmonary disease.  Electronically Signed   By: Inez Catalina M.D.   On: 07/14/2020 22:44    ECG & Cardiac Imaging    ECG with normal sinus rhythm, borderline LAD, poor R-wave progression, unchanged from prior.  Assessment & Plan   54 year old female with multiple risk factors for coronary disease including hypertension, obesity, longstanding smoking history, premature coronary artery disease in her family who presents with cardiac chest pain and positive troponin.  ECG is stable from prior.  Her chest pain was easily responsive to nitroglycerin in the ED.  Problem list NSTEMI Hypertension Hypokalemia Morbid obesity  Plan NSTEMI -Aspirin 325 mg now and 81 mg indefinitely -Atorvastatin 40 mg now -Heparin infusion ACS nomogram -Nitroglycerin sublingually x3 and nitroglycerin infusion of pain not relieved by sublingual nitroglycerin -Start metoprolol tartrate 12.5 mg twice daily -TTE -N.p.o. for catheterization in the morning -Cardiac rehabilitation  Hypertension -Control of antianginals -Follow-up echocardiogram to advise long-term antihypertensives  Hypokalemia -Full repletion, continue home 20 mg twice daily -Benefit from spironolactone long-term.  Nutrition: N.p.o. for cath DVT ppx: Therapeutically anticoagulated GI ppx: Continue home PPI Advanced Care Planning: Full code   Signed, Delight Hoh, MD 07/15/2020, 1:23 AM

## 2020-07-15 NOTE — Progress Notes (Addendum)
Progress Note  Patient Name: Wendy [name] Date of Encounter: 07/15/2020  [facility]Care Cardiologist: New to Dr. Gasper Sells  Subjective   Improving chest pain/soreness on IV heparin and nitroglycerin drip.  Inpatient Medications    Scheduled Meds:  heparin  4,000 Units Intravenous Once   potassium chloride  40 mEq Oral Once   Continuous Infusions:  heparin 900 Units/hr (07/15/20 0106)   nitroGLYCERIN 5 mcg/min (07/15/20 0104)   PRN Meds:    Vital Signs    Vitals:   07/15/20 0146 07/15/20 0200 07/15/20 0341 07/15/20 0554  BP:  125/62 120/68 115/70  Pulse:   98 83  Resp:  20 19 15   Temp:   98.1 F (36.7 C) 98.6 F (37 C)  TempSrc:   Oral Oral  SpO2:   98% 98%  Weight: 85.3 kg     Height:       No intake or output data in the 24 hours ending 07/15/20 0743 Last 3 Weights 07/15/2020 07/14/2020 12/12/2019  Weight (lbs) 188 lb 202 lb 13.2 oz 186 lb  Weight (kg) 85.276 kg 92 kg 84.369 kg      Telemetry    NSR- Personally Reviewed  ECG   NSR - Personally Reviewed  Physical Exam   GEN: No acute distress.   Neck: No JVD Cardiac: RRR, no murmurs, rubs, or gallops.  Respiratory: Clear to auscultation bilaterally. GI: Soft, nontender, non-distended  MS: No edema; No deformity. Neuro:  Nonfocal  Psych: Normal affect   Labs    High Sensitivity Troponin:   Recent Labs  Lab 07/14/20 2225 07/15/20 0034  TROPONINIHS 932* 1,251*      Chemistry Recent Labs  Lab 07/14/20 2225  NA 140  K 3.2*  CL 106  CO2 23  GLUCOSE 101*  BUN 9  CREATININE 1.04*  CALCIUM 9.1  GFRNONAA >60  ANIONGAP 11     Hematology Recent Labs  Lab 07/14/20 2225  WBC 7.7  RBC 4.27  HGB 12.4  HCT 39.3  MCV 92.0  MCH 29.0  MCHC 31.6  RDW 12.6  PLT 236    BNP Recent Labs  Lab 07/15/20 0034  BNP 62.2     Radiology    DG Chest 2 View  Result Date: 07/14/2020 CLINICAL DATA:  Chest pain and shortness of breath for 2 days EXAM: CHEST - 2 VIEW COMPARISON:   12/12/2019 FINDINGS: The heart size and mediastinal contours are within normal limits. Both lungs are clear. The visualized skeletal structures are unremarkable. IMPRESSION: No active cardiopulmonary disease. Electronically Signed   By: Inez Catalina M.D.   On: 07/14/2020 22:44    Cardiac Studies   Pending cath and echo   Patient Profile     54 y.o. female with history of hypertension, hyperlipidemia, tobacco use, depression and strong family history of premature CAD presented with progressive worsening chest pain radiating to her neck who found to have elevated troponin consistent with non-STEMI.  Assessment & Plan    1. NSTEMI - Hs-troponin 932>>1251.  Chest pain improving on nitroglycerin drip and IV heparin. Aspirin given early morning.  Add statin and consider beta-blocker.  Pending echocardiogram and cardiac catheterization. The patient understands that risks include but are not limited to stroke (1 in 1000), death (1 in 41), kidney failure [usually temporary] (1 in 500), bleeding (1 in 200), allergic reaction [possibly serious] (1 in 200), and agrees to proceed.   -Patient reported severe nausea and vomiting after CT scan previous back.  Will  premedicate her.  2.  Hypertension -Blood pressure stable on nitroglycerin drip -Home amlodipine and Diovan/HCTZ on hold -We will consider adding beta-blocker on post cath  3.  Hyperlipidemia - No results found for requested labs within last 8760 hours.  -Check lipid panel -Increase home Crestor to 40 mg daily  4.  Tobacco abuse -Recommended cessation  5. Hypokalemia - supplement given   For questions or updates, please contact [place] Please consult [email or web address] for contact info under        SignedLeanor Kail, PA  07/15/2020, 7:43 AM    Personally seen and examined. Agree with APP above with the following comments: 54 yo M with new chest pain and muscle ache that has presently improving on nitro drip. No  prior angina, no upcoming surgeries or reasons she could not get PCI Exam notable for +2 Radial pulse and +2 R Femoral without bruit ECG without ST elevation or anterior q wave criteria LCP and Echo today; continue heparin Getting pre-treatment, thought I suspect her contrast response was not allergic in nature Replete K; low threshold to start Aldactone as part of her HTN regimen.  Werner Lean, MD

## 2020-07-15 NOTE — Plan of Care (Signed)

## 2020-07-15 NOTE — H&P (View-Only) (Signed)
Progress Note  Patient Name: Wendy [name] Date of Encounter: 07/15/2020  [facility] Cardiologist: New to Dr. Gasper Sells  Subjective   Improving chest pain/soreness on IV heparin and nitroglycerin drip.  Inpatient Medications    Scheduled Meds:  heparin  4,000 Units Intravenous Once   potassium chloride  40 mEq Oral Once   Continuous Infusions:  heparin 900 Units/hr (07/15/20 0106)   nitroGLYCERIN 5 mcg/min (07/15/20 0104)   PRN Meds:    Vital Signs    Vitals:   07/15/20 0146 07/15/20 0200 07/15/20 0341 07/15/20 0554  BP:  125/62 120/68 115/70  Pulse:   98 83  Resp:  20 19 15   Temp:   98.1 F (36.7 C) 98.6 F (37 C)  TempSrc:   Oral Oral  SpO2:   98% 98%  Weight: 85.3 kg     Height:       No intake or output data in the 24 hours ending 07/15/20 0743 Last 3 Weights 07/15/2020 07/14/2020 12/12/2019  Weight (lbs) 188 lb 202 lb 13.2 oz 186 lb  Weight (kg) 85.276 kg 92 kg 84.369 kg      Telemetry    NSR- Personally Reviewed  ECG   NSR - Personally Reviewed  Physical Exam   GEN: No acute distress.   Neck: No JVD Cardiac: RRR, no murmurs, rubs, or gallops.  Respiratory: Clear to auscultation bilaterally. GI: Soft, nontender, non-distended  MS: No edema; No deformity. Neuro:  Nonfocal  Psych: Normal affect   Labs    High Sensitivity Troponin:   Recent Labs  Lab 07/14/20 2225 07/15/20 0034  TROPONINIHS 932* 1,251*      Chemistry Recent Labs  Lab 07/14/20 2225  NA 140  K 3.2*  CL 106  CO2 23  GLUCOSE 101*  BUN 9  CREATININE 1.04*  CALCIUM 9.1  GFRNONAA >60  ANIONGAP 11     Hematology Recent Labs  Lab 07/14/20 2225  WBC 7.7  RBC 4.27  HGB 12.4  HCT 39.3  MCV 92.0  MCH 29.0  MCHC 31.6  RDW 12.6  PLT 236    BNP Recent Labs  Lab 07/15/20 0034  BNP 62.2     Radiology    DG Chest 2 View  Result Date: 07/14/2020 CLINICAL DATA:  Chest pain and shortness of breath for 2 days EXAM: CHEST - 2 VIEW COMPARISON:   12/12/2019 FINDINGS: The heart size and mediastinal contours are within normal limits. Both lungs are clear. The visualized skeletal structures are unremarkable. IMPRESSION: No active cardiopulmonary disease. Electronically Signed   By: Inez Catalina M.D.   On: 07/14/2020 22:44    Cardiac Studies   Pending cath and echo   Patient Profile     54 y.o. female with history of hypertension, hyperlipidemia, tobacco use, depression and strong family history of premature CAD presented with progressive worsening chest pain radiating to her neck who found to have elevated troponin consistent with non-STEMI.  Assessment & Plan    1. NSTEMI - Hs-troponin 932>>1251.  Chest pain improving on nitroglycerin drip and IV heparin. Aspirin given early morning.  Add statin and consider beta-blocker.  Pending echocardiogram and cardiac catheterization. The patient understands that risks include but are not limited to stroke (1 in 1000), death (1 in 77), kidney failure [usually temporary] (1 in 500), bleeding (1 in 200), allergic reaction [possibly serious] (1 in 200), and agrees to proceed.   -Patient reported severe nausea and vomiting after CT scan previous back.  Will  premedicate her.  2.  Hypertension -Blood pressure stable on nitroglycerin drip -Home amlodipine and Diovan/HCTZ on hold -We will consider adding beta-blocker on post cath  3.  Hyperlipidemia - No results found for requested labs within last 8760 hours.  -Check lipid panel -Increase home Crestor to 40 mg daily  4.  Tobacco abuse -Recommended cessation  5. Hypokalemia - supplement given   For questions or updates, please contact [place] Please consult [email or web address] for contact info under        SignedLeanor Kail, PA  07/15/2020, 7:43 AM    Personally seen and examined. Agree with APP above with the following comments: 54 yo M with new chest pain and muscle ache that has presently improving on nitro drip. No  prior angina, no upcoming surgeries or reasons she could not get PCI Exam notable for +2 Radial pulse and +2 R Femoral without bruit ECG without ST elevation or anterior q wave criteria LCP and Echo today; continue heparin Getting pre-treatment, thought I suspect her contrast response was not allergic in nature Replete K; low threshold to start Aldactone as part of her HTN regimen.  Werner Lean, MD

## 2020-07-15 NOTE — Progress Notes (Signed)
ANTICOAGULATION CONSULT NOTE - Initial Consult  Pharmacy Consult for Heparin Indication: chest pain/ACS  Allergies  Allergen Reactions  . Pfizer-Biontech Covid-19 Vacc [Covid-19 Mrna Vacc (Moderna)] Shortness Of Breath and Swelling    Tightness in Chest  . Chocolate Hives  . Latex Hives, Itching and Swelling  . Shellfish Allergy   . Fish-Derived Products Nausea And Vomiting  . Peanut-Containing Drug Products Hives  . Soap Rash    Patient Measurements: Height: 4\' 11"  (149.9 cm) Weight: 85.3 kg (188 lb) IBW/kg (Calculated) : 43.2  Vital Signs: Temp: 98.6 F (37 C) (11/04 0554) Temp Source: Oral (11/04 0554) BP: 125/63 (11/04 0700) Pulse Rate: 83 (11/04 0554)  Labs: Recent Labs    07/14/20 2225 07/15/20 0034 07/15/20 1058  HGB 12.4  --   --   HCT 39.3  --   --   PLT 236  --   --   LABPROT 12.4  --   --   INR 1.0  --   --   HEPARINUNFRC  --   --  0.31  CREATININE 1.04*  --   --   TROPONINIHS 932* 1,251*  --     Estimated Creatinine Clearance: 58.6 mL/min (A) (by C-G formula based on SCr of 1.04 mg/dL (H)).   Medical History: Past Medical History:  Diagnosis Date  . Anxiety   . Asthma    daily and prn inhalers  . Cervical disc disease    decreased range of motion  . Chronic pain    shoulders, neck, lower back  . Deformity, hand    left  . Depression   . Endometriosis    Lupron injection Q 3 mos.  . Fluid retention in legs   . Injury, brachial plexus    left  . Multiple allergies    takes allergy shots  . PONV (postoperative nausea and vomiting)   . 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 Wears partial dentures    upper partial     Assessment: 54 y/o F with chest pain. Mildly elevated troponin. Currently therapeutic on IV heparin at 900 units/hr. H/H and Plt wnl. Scr 1.04   Goal of Therapy:  Heparin level 0.3-0.7 units/ml Monitor platelets by anticoagulation protocol: Yes   Plan:  Continue IV heparin at 900 units/hr   For LHC today. F/u after cath   Albertina Parr, PharmD., BCPS, BCCCP Clinical Pharmacist Please refer to [facility] for unit-specific pharmacist

## 2020-07-16 ENCOUNTER — Other Ambulatory Visit (HOSPITAL_COMMUNITY): Payer: Self-pay | Admitting: Physician Assistant

## 2020-07-16 ENCOUNTER — Inpatient Hospital Stay (HOSPITAL_COMMUNITY): Payer: Medicare Other

## 2020-07-16 ENCOUNTER — Encounter (HOSPITAL_COMMUNITY): Payer: Self-pay | Admitting: Interventional Cardiology

## 2020-07-16 ENCOUNTER — Telehealth: Payer: Self-pay

## 2020-07-16 DIAGNOSIS — I340 Nonrheumatic mitral (valve) insufficiency: Secondary | ICD-10-CM

## 2020-07-16 DIAGNOSIS — I351 Nonrheumatic aortic (valve) insufficiency: Secondary | ICD-10-CM

## 2020-07-16 DIAGNOSIS — I361 Nonrheumatic tricuspid (valve) insufficiency: Secondary | ICD-10-CM

## 2020-07-16 LAB — BASIC METABOLIC PANEL
Anion gap: 11 (ref 5–15)
BUN: 9 mg/dL (ref 6–20)
CO2: 18 mmol/L — ABNORMAL LOW (ref 22–32)
Calcium: 9.4 mg/dL (ref 8.9–10.3)
Chloride: 110 mmol/L (ref 98–111)
Creatinine, Ser: 0.88 mg/dL (ref 0.44–1.00)
GFR, Estimated: >60 mL/min (ref >60)
Glucose, Bld: 129 mg/dL — ABNORMAL HIGH (ref 70–99)
Potassium: 3.7 mmol/L (ref 3.5–5.1)
Sodium: 139 mmol/L (ref 135–145)

## 2020-07-16 LAB — CBC
HCT: 38.9 % (ref 36.0–46.0)
Hemoglobin: 12.4 g/dL (ref 12.0–15.0)
MCH: 28.2 pg (ref 26.0–34.0)
MCHC: 31.9 g/dL (ref 30.0–36.0)
MCV: 88.6 fL (ref 80.0–100.0)
Platelets: 243 10*3/uL (ref 150–400)
RBC: 4.39 MIL/uL (ref 3.87–5.11)
RDW: 12.6 % (ref 11.5–15.5)
WBC: 6.8 10*3/uL (ref 4.0–10.5)
nRBC: 0 % (ref 0.0–0.2)

## 2020-07-16 LAB — ECHOCARDIOGRAM COMPLETE
Area-P 1/2: 3.91 cm2
Height: 59 in
MV M vel: 5.58 m/s
MV Peak grad: 124.5 mmHg
P 1/2 time: 304 msec
Radius: 0.2 cm
S' Lateral: 3.71 cm
Weight: 2966.4 oz

## 2020-07-16 MED ORDER — METOPROLOL TARTRATE 25 MG PO TABS: 12.5000 mg | ORAL_TABLET | Freq: Two times a day (BID) | ORAL | 6 refills | Status: DC

## 2020-07-16 MED ORDER — NITROGLYCERIN 0.4 MG SL SUBL: 0.4000 mg | SUBLINGUAL_TABLET | SUBLINGUAL | 12 refills | Status: DC | PRN

## 2020-07-16 MED ORDER — ROSUVASTATIN CALCIUM 40 MG PO TABS: 40.0000 mg | ORAL_TABLET | Freq: Every day | ORAL | 6 refills | Status: DC

## 2020-07-16 MED ORDER — HEART ATTACK BOUNCING BOOK
Freq: Once | Status: AC
  Filled 2020-07-16: qty 1

## 2020-07-16 MED ORDER — ASPIRIN 81 MG PO TBEC: 81.0000 mg | DELAYED_RELEASE_TABLET | Freq: Every day | ORAL | 11 refills | Status: AC

## 2020-07-16 MED ORDER — ANGIOPLASTY BOOK
Freq: Once | Status: AC
  Filled 2020-07-16: qty 1

## 2020-07-16 MED ORDER — THE SENSUOUS HEART BOOK
Freq: Once | Status: AC
  Filled 2020-07-16: qty 1

## 2020-07-16 MED ORDER — POTASSIUM CHLORIDE CRYS ER 20 MEQ PO TBCR
40.0000 meq | EXTENDED_RELEASE_TABLET | Freq: Once | ORAL | Status: AC
  Administered 2020-07-16: 40 meq via ORAL
  Filled 2020-07-16: qty 2

## 2020-07-16 MED ORDER — TICAGRELOR 90 MG PO TABS: 90.0000 mg | ORAL_TABLET | Freq: Two times a day (BID) | ORAL | 11 refills | Status: DC

## 2020-07-16 MED FILL — METOPROLOL TARTRATE 25 MG T: 25 | 30 days supply | Qty: 30 | Fill #0

## 2020-07-16 MED FILL — ROSUVASTATIN CALCIUM 40 MG: 40 | 30 days supply | Qty: 30 | Fill #0

## 2020-07-16 MED FILL — BRILINTA 90 MG TABLET: 90 | 30 days supply | Qty: 60 | Fill #0

## 2020-07-16 MED FILL — NITROGLYCERIN 0.4 MG TAB SL: 0.4 | 25 days supply | Qty: 25 | Fill #0

## 2020-07-16 NOTE — Telephone Encounter (Signed)
**Note De-identified Wendy [name] Obfuscation** -----  **Note De-Identified Wendy [name] Obfuscation** Message from [place], [place] sent at 07/16/2020  8:46 AM EDT ----- This patient needs [facility] phone call. Appointment has been scheduled, Please change appointment to [facility] if needed. Will go home later today.   Thanks

## 2020-07-16 NOTE — Discharge Summary (Addendum)
Discharge Summary    Patient ID: Wendy [name] MRN: [id number]; DOB: 01-14-1966  Admit date: 07/14/2020 Discharge date: 07/16/2020  Primary Care Provider: Lucianne Lei, MD  Primary Cardiologist: Werner Lean, MD    Discharge Diagnoses    Active Problems:   NSTEMI (non-ST elevated myocardial infarction) ([place])   HTN   HLD   Tobacco abuse   Hypokalemia    Diagnostic Studies/Procedures    CORONARY STENT INTERVENTION  LEFT HEART CATH AND CORONARY ANGIOGRAPHY  Conclusion    Prox RCA lesion is 50% stenosed. Vasospasm noted on this lesion which improved with IC NTG. Mid RCA lesion is 99% stenosed. This was the culprit lesion which was ulcerated. A drug-eluting stent was successfully placed using a STENT RESOLUTE ONYX 3.0X15, postdilated to 3.25 mm. Post intervention, there is a 0% residual stenosis. 1st Mrg lesion is 80% stenosed. A drug-eluting stent was successfully placed using a STENT RESOLUTE ONYX 2.5X18, postdilated to 3.0 mm. Post intervention, there is a 0% residual stenosis. Prox Cx lesion is 25% stenosed. 1st Diag lesion is 50% stenosed. Prox LAD lesion is 25% stenosed. The left ventricular systolic function is normal. LV end diastolic pressure is moderately elevated. The left ventricular ejection fraction is 55-65% by visual estimate. There is no aortic valve stenosis.   She needs aggressive risk factor modification.    DAPT with healthy, plant based diet, high dose statin.     Diagnostic Dominance: Right  Intervention     Echo  Pending final reading Preliminary review by Dr. Gasper Sells showed preserved LVEF, moderate AI and moderate MR.   History of Present Illness     Wendy [name] is a 54 y.o. female with history of hypertension, hyperlipidemia, tobacco use, depression and strong family history of premature CAD presented with progressive worsening chest pain radiating to her neck who found to have elevated troponin consistent with  non-STEMI.  She reported one month hx of chest pain that was substernal and going up into her neck.  She had gone to her primary care provider who thought her pain was likely noncardiac. However chest pain/pressure continue to worsen. Associated with dyspnea that was worse with exertion relieved by rest.  Given the crescendo nature of her pain she decided to present to the ED.  She received a sublingual nitroglycerin with resolution of her chest pain from around 5/10.   ECG reviewed with normal sinus rhythm with borderline left axis deviation and poor R wave progression nonspecific ST changes.   Hospital Course     Consultants: None  1. NSTEMI - Hs-troponin 932>>1251.  Chest pain improved on nitroglycerin drip and IV heparin. -Patient reported severe nausea and vomiting after CT scan previously. Pre-medicated for cath which showed 99% mRCA and 80% 1st Mrg both treated with PCI with Resolute DES. She had vasospasm on pRCA which improved with IC NTG. Recommended aggressive risk modifications for residual non obstructive disease. Continue ASA, Brillinta, statin and BB. Preliminary review of echo by Dr. Gasper Sells showed preserved LVEF, moderate AI and moderate MR. . Ambulated well with cardiac rehab without recurrent chest pain.    2.  Hypertension -Home amlodipine and Diovan/HCTZ were hold while on nitro drip which discontinued at discharge - Added low dose BB  - BP stable     3.  Hyperlipidemia - 07/15/2020: Cholesterol 248; HDL 80; LDL Cholesterol 159; Triglycerides 46; VLDL 9  - LDL goal less than 70 -Increased home Crestor to 40 mg daily - Repeat Lipid panel and LFTS  in 6-8 weeks    4.  Tobacco abuse -Recommended cessation - Education given    5. Hypokalemia - One dose of supplement given yesterday. Will check BMET this morning    The patient been seen by Dr. Gasper Sells today and deemed ready for discharge home. All follow-up appointments have been scheduled. Discharge  medications are listed below.  Did the patient have an acute coronary syndrome (MI, NSTEMI, STEMI, etc) this admission?:  Yes                               AHA/ACC Clinical Performance & Quality Measures: Aspirin prescribed? - Yes ADP Receptor Inhibitor (Plavix/Clopidogrel, Brilinta/Ticagrelor or Effient/Prasugrel) prescribed (includes medically managed patients)? - Yes Beta Blocker prescribed? - Yes High Intensity Statin (Lipitor 40-80mg  or Crestor 20-40mg ) prescribed? - Yes EF assessed during THIS hospitalization? - Yes For EF <40%, was ACEI/ARB prescribed? - Not Applicable (EF >/= 53%) For EF <40%, Aldosterone Antagonist (Spironolactone or Eplerenone) prescribed? - Not Applicable (EF >/= 97%) Cardiac Rehab Phase II ordered (including medically managed patients)? - Yes     Discharge Vitals Blood pressure (!) 114/50, pulse 72, temperature 98.3 F (36.8 C), temperature source Oral, resp. rate 15, height 4\' 11"  (1.499 m), weight 84.1 kg, last menstrual period 07/12/2016, SpO2 99 %.  Filed Weights   07/14/20 2219 07/15/20 0146 07/16/20 0409  Weight: 92 kg 85.3 kg 84.1 kg   Physical Exam Constitutional:      Appearance: She is well-developed.  HENT:     Head: Normocephalic.  Cardiovascular:     Rate and Rhythm: Normal rate and regular rhythm.     Heart sounds: No murmur heard.      Comments: Right radial cath site without hematoma or ecchymosis  Pulmonary:     Effort: Pulmonary effort is normal.     Breath sounds: Normal breath sounds.  Abdominal:     General: Bowel sounds are normal.     Palpations: Abdomen is soft.  Musculoskeletal:        General: Normal range of motion.     Cervical back: Normal range of motion and neck supple.  Skin:    General: Skin is warm and dry.  Neurological:     General: No focal deficit present.     Mental Status: She is alert and oriented to person, place, and time.  Psychiatric:        Mood and Affect: Mood normal.        Behavior: Behavior  normal.    Labs & Radiologic Studies    CBC Recent Labs    07/15/20 1730 07/16/20 0117  WBC 6.6 6.8  HGB 12.9 12.4  HCT 39.9 38.9  MCV 89.7 88.6  PLT 232 673   Basic Metabolic Panel Recent Labs    07/14/20 2225 07/16/20 0843  NA 140 139  K 3.2* 3.7  CL 106 110  CO2 23 18*  GLUCOSE 101* 129*  BUN 9 9  CREATININE 1.04* 0.88  CALCIUM 9.1 9.4   High Sensitivity Troponin:   Recent Labs  Lab 07/14/20 2225 07/15/20 0034  TROPONINIHS 932* 1,251*    Hemoglobin A1C Recent Labs    07/15/20 1730  HGBA1C 5.0   Fasting Lipid Panel Recent Labs    07/15/20 1730  CHOL 248*  HDL 80  LDLCALC 159*  TRIG 46  CHOLHDL 3.1   Thyroid Function Tests Recent Labs    07/15/20 1730  TSH 0.618  _____________  DG Chest 2 View  Result Date: 07/14/2020 CLINICAL DATA:  Chest pain and shortness of breath for 2 days EXAM: CHEST - 2 VIEW COMPARISON:  12/12/2019 FINDINGS: The heart size and mediastinal contours are within normal limits. Both lungs are clear. The visualized skeletal structures are unremarkable. IMPRESSION: No active cardiopulmonary disease. Electronically Signed   By: Inez Catalina M.D.   On: 07/14/2020 22:44   CARDIAC CATHETERIZATION  Result Date: 07/15/2020  Prox RCA lesion is 50% stenosed. Vasospasm noted on this lesion which improved with IC NTG.  Mid RCA lesion is 99% stenosed. This was the culprit lesion which was ulcerated.  A drug-eluting stent was successfully placed using a STENT RESOLUTE ONYX 3.0X15, postdilated to 3.25 mm.  Post intervention, there is a 0% residual stenosis.  1st Mrg lesion is 80% stenosed.  A drug-eluting stent was successfully placed using a STENT RESOLUTE ONYX 2.5X18, postdilated to 3.0 mm.  Post intervention, there is a 0% residual stenosis.  Prox Cx lesion is 25% stenosed.  1st Diag lesion is 50% stenosed.  Prox LAD lesion is 25% stenosed.  The left ventricular systolic function is normal.  LV end diastolic pressure is  moderately elevated.  The left ventricular ejection fraction is 55-65% by visual estimate.  There is no aortic valve stenosis.  She needs aggressive risk factor modification. DAPT with healthy, plant based diet, high dose statin.    Disposition   Pt is being discharged home today in good condition.  Follow-up Plans & Appointments     Follow-up Information     Werner Lean, MD. Go on 07/30/2020.   Specialty: Cardiology Why: @8 :20am for hospital follow up. Please arrive 15 minutes early  Contact information: [address] [place] [address] [phone]                Discharge Instructions     Amb Referral to Cardiac Rehabilitation   Complete by: As directed    Diagnosis:  NSTEMI Coronary Stents     After initial evaluation and assessments completed: Virtual Based Care may be provided alone or in conjunction with Phase 2 Cardiac Rehab based on patient barriers.: Yes   Diet - low sodium heart healthy   Complete by: As directed    Discharge instructions   Complete by: As directed    NO HEAVY LIFTING (>10lbs) X 2 WEEKS. NO SEXUAL ACTIVITY X 2 WEEKS. NO DRIVING X 1 WEEK. NO SOAKING BATHS, HOT TUBS, POOLS, ETC., X 7 DAYS.  Return to work after seen in clinic 07/30/2020.   Increase activity slowly   Complete by: As directed        Discharge Medications   Allergies as of 07/16/2020       Reactions   Pfizer-biontech Covid-19 Vacc [covid-19 Mrna Vacc (moderna)] Shortness Of Breath, Swelling   Tightness in Chest   Chocolate Hives   Ivp Dye [iodinated Diagnostic Agents] Nausea And Vomiting   After CT scan   Latex Hives, Itching, Swelling   Shellfish Allergy    Fish-derived Products Nausea And Vomiting   Peanut-containing Drug Products Hives   Soap Rash        Medication List     STOP taking these medications    amLODipine 5 MG tablet Commonly known as: NORVASC   amoxicillin 875 MG tablet Commonly known as: AMOXIL    benzonatate 200 MG capsule Commonly known as: TESSALON   escitalopram 20 MG tablet Commonly known as: LEXAPRO  lurasidone 40 MG Tabs tablet Commonly known as: LATUDA   meloxicam 15 MG tablet Commonly known as: MOBIC   nortriptyline 50 MG capsule Commonly known as: PAMELOR   potassium chloride SA 20 MEQ tablet Commonly known as: KLOR-CON   predniSONE 20 MG tablet Commonly known as: Deltasone   ranitidine 150 MG tablet Commonly known as: ZANTAC   tinidazole 500 MG tablet Commonly known as: Tindamax   valACYclovir 1000 MG tablet Commonly known as: Valtrex   valsartan-hydrochlorothiazide 160-12.5 MG tablet Commonly known as: DIOVAN-HCT       TAKE these medications    acetaminophen 500 MG tablet Commonly known as: TYLENOL Take 500 mg by mouth every 6 (six) hours as needed for moderate pain.   albuterol 108 (90 Base) MCG/ACT inhaler Commonly known as: VENTOLIN HFA Inhale 2 puffs into the lungs every 4 (four) hours as needed for wheezing or shortness of breath (cough).   aspirin 81 MG EC tablet Take 1 tablet (81 mg total) by mouth daily. Swallow whole. Start taking on: July 17, 2020   beclomethasone 80 MCG/ACT inhaler Commonly known as: QVAR Inhale 1 puff into the lungs 2 (two) times daily as needed (sob/wheezing).   bismuth subsalicylate 030 DT/14HO suspension Commonly known as: PEPTO BISMOL Take 30 mLs by mouth every 6 (six) hours as needed for indigestion.   calcium carbonate 500 MG chewable tablet Commonly known as: TUMS - dosed in mg elemental calcium Chew 1 tablet by mouth daily as needed for indigestion or heartburn.   celecoxib 200 MG capsule Commonly known as: CELEBREX Take 200 mg by mouth daily as needed for mild pain or moderate pain.   divalproex 250 MG DR tablet Commonly known as: DEPAKOTE Take 250 mg by mouth 2 (two) times daily.   EPINEPHrine 0.15 MG/0.3ML injection Commonly known as: EPIPEN JR Inject 0.15 mg into the muscle daily  as needed for anaphylaxis.   ergocalciferol 1.25 MG (50000 UT) capsule Commonly known as: VITAMIN D2 Take 50,000 Units by mouth once a week. Sundays   furosemide 20 MG tablet Commonly known as: LASIX Take 10 mg by mouth daily as needed for fluid. For swelling   gabapentin 600 MG tablet Commonly known as: NEURONTIN Take 600 mg by mouth at bedtime.   hydrOXYzine 25 MG tablet Commonly known as: ATARAX/VISTARIL Take 25 mg by mouth 3 (three) times daily as needed for anxiety or itching.   metoprolol tartrate 25 MG tablet Commonly known as: LOPRESSOR Take 0.5 tablets (12.5 mg total) by mouth 2 (two) times daily.   nitroGLYCERIN 0.4 MG SL tablet Commonly known as: NITROSTAT Place 1 tablet (0.4 mg total) under the tongue every 5 (five) minutes x 3 doses as needed for chest pain.   pantoprazole 40 MG tablet Commonly known as: PROTONIX Take 40 mg by mouth daily as needed (heartburn).   rosuvastatin 40 MG tablet Commonly known as: CRESTOR Take 1 tablet (40 mg total) by mouth daily. What changed:  medication strength how much to take   ticagrelor 90 MG Tabs tablet Commonly known as: BRILINTA Take 1 tablet (90 mg total) by mouth 2 (two) times daily.           Outstanding Labs/Studies   Lipid panel and LFTS in 6-8 weeks   Duration of Discharge Encounter   Greater than 30 minutes including physician time.  SignedCrista Luria [place], PA 07/16/2020, 1:01 PM   Personally seen and examined. Agree with APP above with the following comments: Briefly AA F with prior  smoking, HTN, and HLD who presents after NSTEMI.  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 Exam notable for no bruit or hematoma an R wrist cath site.  Notable diastolic rumble Personally reviewed relevant tests; presvered EF EF 55-60% with moderate central AI without LV dilation and mild to moderate eccentric MR.  Will follow  her as outpatient. Would recommend DAPT and statin above; discussed smoking cessation at length.  Tolerated low dose BB in the setting of AI.  Werner Lean, MD

## 2020-07-16 NOTE — Progress Notes (Signed)
CARDIAC REHAB PHASE I   PRE:  Rate/Rhythm: 95 SR  BP:  Supine: 124/69  Sitting:   Standing:    SaO2: 99%RA  MODE:  Ambulation: 800 ft   POST:  Rate/Rhythm: 109 ST  BP:  Supine:   Sitting: 136/76  Standing:    SaO2: 100%RA 0802-0915 Pt walked 800 ft on RA with steady gait and no CP. Tolerated well. MI education completed with pt who voiced understanding. Stressed importance of brililnta with stent. Reviewed NTG use, MI restriction, smoking cessation (handout given), walking for ex, heart healthy food choices and CRP 2. Referred to [place] program. Pt has been under a lot of stress lately due to issues with Medicaid and deceased dad's estate. She is interested in Virtual if CRP 2 not covered by insurance as concerned with finances. Encouraged to call 1800quitnow if needed to assist with tobacco cessation.  Pt is interested in participating in Virtual Cardiac and Pulmonary Rehab. Pt advised that Virtual Cardiac and Pulmonary Rehab is provided at no cost to the patient.  Checklist:  1. Pt has smart device  ie smartphone and/or ipad for downloading an app  Yes 2. Reliable internet/wifi service    Yes 3. Understands how to use their smartphone and navigate within an app.  Yes   Pt verbalized understanding and is in agreement.    Graylon Good, RN BSN  07/16/2020 9:10 AM

## 2020-07-16 NOTE — Progress Notes (Signed)
  Echocardiogram 2D Echocardiogram has been attempted. Patient receiving consult. Will reattempt at later time. Wendy [name] 07/16/2020, 8:54 AM

## 2020-07-16 NOTE — Plan of Care (Signed)

## 2020-07-16 NOTE — TOC Benefit Eligibility Note (Signed)
Transition of Care [facility]) Benefit Eligibility Note    Patient Details  Name: Wendy [name] MRN: [id number] Date of Birth: Dec 20, 1965   Medication/Dose: Kary Kos 90mg  bid  Covered?: Yes  Tier: 3 Drug  Prescription Coverage Preferred Pharmacy: [facility], [place], [facility] or [facility]  Spoke with Person/Company/Phone Number:: [facility]  Co-Pay: $9.20 for 90 day mail ordr and/or 30 day retail  Prior Approval: No    Delorse Lek Phone Number: 07/16/2020, 10:36 AM

## 2020-07-16 NOTE — Progress Notes (Signed)
  Echocardiogram 2D Echocardiogram has been performed.  Devany Aja G Ariston Grandison 07/16/2020, 11:00 AM

## 2020-07-19 ENCOUNTER — Telehealth: Payer: Self-pay | Admitting: Internal Medicine

## 2020-07-19 ENCOUNTER — Other Ambulatory Visit: Payer: Self-pay

## 2020-07-19 ENCOUNTER — Other Ambulatory Visit: Payer: Self-pay | Admitting: Internal Medicine

## 2020-07-19 MED ORDER — FUROSEMIDE 20 MG PO TABS: 10.0000 mg | ORAL_TABLET | Freq: Every day | ORAL | 0 refills | Status: DC | PRN

## 2020-07-19 NOTE — Telephone Encounter (Signed)
**Note De-Identified Wendy [name] Obfuscation** Patient contacted regarding discharge from [facility] on 07/16/2020.  Patient understands to follow up with Dr Gasper Sells on 07/30/2020 at 8:20 at [address], [address] in [place], [place] [address]. Patient understands discharge instructions? Yes Patient understands medications and regiment? Yes Patient understands to bring all medications to this visit? Yes  Ask patient:  Are you enrolled in My Chart: Yes  The pt states that she is "really nervous and anxious about her heart" but otherwise she is doing ok. I gave her lots of reassurance and answered all of her questions.  She states that she does not have any Furosemide on hand and wants to know if it is needed and I advised her that she does need to have it as it is prescribed as needed for weight gain, swelling, and/or SOB.   She denies CP/discomfort, nausea, diaphoresis, weakness, weight gain and states that she only has "a little" swelling in her ankles at this time.  She does c/o SOB at night while she is trying to sleep.  I have advised her that the Furosemide may help with her SOB. Per her request I did e-scribe her Furosemide #15 refill to [facility] to fill.  She thanked me for calling her and "making her fell a little better". She is aware to call us if she has any questions or concerns.

## 2020-07-19 NOTE — Telephone Encounter (Signed)
°*  STAT* If patient is at the pharmacy, call can be transferred to refill team.   1. Which medications need to be refilled? (please list name of each medication and dose if known)   albuterol (PROVENTIL HFA;VENTOLIN HFA) 108 (90 Base) MCG/ACT inhaler    2. Which pharmacy/location (including street and city if local pharmacy) is medication to be sent to? [facility], [place] - [address] AT [address]  3. Do they need a 30 day or 90 day supply? [place]

## 2020-07-19 NOTE — Telephone Encounter (Signed)
Called pt to inform her that she needed to contact her PCP for a refill on her Albuterol inhaler, but if she needs any refills on her cardiac medications, to give our office a call back. Pt verbalized understanding.

## 2020-07-20 ENCOUNTER — Other Ambulatory Visit: Payer: Self-pay | Admitting: *Deleted

## 2020-07-20 NOTE — Patient Outreach (Signed)
[facility]) Care Management  07/20/2020  Wendy [name] 07-Sep-1966 [id number]   EMMI- General Discharge RED ON EMMI ALERT Day #1 Date:07/18/2020 Red Alert Reason: questions/concerns/unfilled medications/unable to fill/know who to call  OUTREACH#1 RN spoke with pt and verified credentials. Inquired on the above emmi. Pt states she is pending an appointment with her provider and verified all issues above have been resolved. Pt request something to help her stop smoking. Discussed smoking cessation hotline and strongly encourage pt to discuss possible prescription medications on her follow up call pending her provider next week.   RN also attempted to contact her provider with this request however unsuccessful.  Will close case with all emmi above resolved and no additional needs.  Raina Mina, RN Care Management Coordinator [place] Office [phone]

## 2020-07-23 ENCOUNTER — Telehealth (HOSPITAL_COMMUNITY): Payer: Self-pay

## 2020-07-23 NOTE — Telephone Encounter (Signed)
Called patient to see if she is interested in the Cardiac Rehab Program. Patient expressed interest. Explained scheduling process and went over insurance, patient verbalized understanding. Will contact patient for scheduling once f/u has been completed. 

## 2020-07-23 NOTE — Telephone Encounter (Signed)
Pt insurance is active and benefits verified through [facility] Medicare. Co-pay $0.00, DED $0.00/$0.00 met, out of pocket $3,600.00/$256.97 met, co-insurance 0%. No pre-authorization required. Passport, 07/23/20 @ 2:55PM, HFW#[id number]  Will contact patient to see if she is interested in the Cardiac Rehab Program. If interested, patient will need to complete follow up appt. Once completed, patient will be contacted for scheduling upon review by the RN Navigator.

## 2020-07-24 ENCOUNTER — Telehealth: Payer: Self-pay | Admitting: Physician Assistant

## 2020-07-24 ENCOUNTER — Encounter (HOSPITAL_COMMUNITY): Payer: Self-pay

## 2020-07-24 ENCOUNTER — Ambulatory Visit (HOSPITAL_COMMUNITY)
Admission: EM | Admit: 2020-07-24 | Discharge: 2020-07-24 | Disposition: A | Discharge status: Nursing Home (Includes ICF) | Payer: Medicare Other | Attending: Family Medicine | Admitting: Family Medicine

## 2020-07-24 ENCOUNTER — Other Ambulatory Visit: Payer: Self-pay

## 2020-07-24 ENCOUNTER — Telehealth: Payer: Self-pay | Admitting: Internal Medicine

## 2020-07-24 DIAGNOSIS — R109 Unspecified abdominal pain: Secondary | ICD-10-CM

## 2020-07-24 DIAGNOSIS — R140 Abdominal distension (gaseous): Secondary | ICD-10-CM | POA: Insufficient documentation

## 2020-07-24 DIAGNOSIS — K30 Functional dyspepsia: Secondary | ICD-10-CM | POA: Diagnosis not present

## 2020-07-24 DIAGNOSIS — M549 Dorsalgia, unspecified: Secondary | ICD-10-CM | POA: Diagnosis not present

## 2020-07-24 DIAGNOSIS — K209 Esophagitis, unspecified without bleeding: Secondary | ICD-10-CM

## 2020-07-24 LAB — POCT URINALYSIS DIPSTICK, ED / UC
Bilirubin Urine: NEGATIVE
Glucose, UA: NEGATIVE mg/dL
Ketones, ur: NEGATIVE mg/dL
Leukocytes,Ua: NEGATIVE
Nitrite: NEGATIVE
Protein, ur: NEGATIVE mg/dL
Specific Gravity, Urine: 1.01 (ref 1.005–1.030)
Urobilinogen, UA: 0.2 mg/dL (ref 0.0–1.0)
pH: 6 (ref 5.0–8.0)

## 2020-07-24 MED ORDER — ALUM & MAG HYDROXIDE-SIMETH 200-200-20 MG/5ML PO SUSP
30.0000 mL | Freq: Once | ORAL | Status: AC
  Administered 2020-07-24: 30 mL via ORAL

## 2020-07-24 MED ORDER — ALUM & MAG HYDROXIDE-SIMETH 200-200-20 MG/5ML PO SUSP
ORAL | Status: AC
  Filled 2020-07-24: qty 30

## 2020-07-24 MED ORDER — POLYETHYLENE GLYCOL 3350 17 G PO PACK
17.0000 g | PACK | Freq: Every day | ORAL | 0 refills | Status: DC
Start: 2020-07-24 — End: 2021-01-24

## 2020-07-24 MED ORDER — LIDOCAINE VISCOUS HCL 2 % MT SOLN
OROMUCOSAL | Status: AC
  Filled 2020-07-24: qty 15

## 2020-07-24 MED ORDER — LIDOCAINE VISCOUS HCL 2 % MT SOLN
15.0000 mL | Freq: Once | OROMUCOSAL | Status: AC
  Administered 2020-07-24: 15 mL via ORAL

## 2020-07-24 MED ORDER — FAMOTIDINE 20 MG PO TABS: 20.0000 mg | ORAL_TABLET | Freq: Two times a day (BID) | ORAL | 0 refills | Status: DC | PRN

## 2020-07-24 NOTE — ED Triage Notes (Signed)
Pt present stomach and back pain. Pt states symptoms started today. Pt state the stomach pain is like a soreness pain all around stomach

## 2020-07-24 NOTE — Telephone Encounter (Signed)
54 year old female who recently was admitted with a non-STEMI treated with a DES to the RCA and OM1.  She has been doing well since discharge up until today.  Today, she started noticing lower abdominal pain.  It is somewhat tender to the touch.  She notes the pain has progressed and radiates to her back.  She has not had chest discomfort, shortness of breath.  She has not had any pain around her right femoral arteriotomy site.  She has not had symptoms reminiscent of her previous angina.  She has not had constipation, diarrhea, vomiting, melena, hematochezia, hematuria, fevers, chills.  PLAN: Etiology of her symptoms are not entirely clear.  I do not think her symptoms represent a cardiac issue or complication of her cardiac catheterization.  She may have cystitis or possibly some type of gastrointestinal infection.  I have recommended that she go to an urgent care to have some basic labs and an abdominal exam to further evaluate. Richardson Dopp, PA-C 07/24/2020 4:35 PM

## 2020-07-24 NOTE — ED Provider Notes (Addendum)
[place]    CSN: [id number] Arrival date & time: 07/24/20  1709      History   Chief Complaint Chief Complaint  Patient presents with  . Back Pain  . Abdominal Pain    HPI Wendy [name] is a 54 y.o. female.   HPI  Patient presents today with abdominal burning and back pain.  X1 day.  Symptoms occurred upon awakening this morning.  Patient has a detailed history involving chronic abdominal pain and has had multiple abdominal scans and also has had an esophagus study which have all been benign.  Patient is status post hospitalization for an NSTEMI and denies any chest pain or shortness of breath.  She reports eating a normal meal last night and awakening this morning with bloating.  She has been having bowel movements although they have been scant stools since her discharge from the hospital.  She is not taking any stool softeners to help relieve stool burden.  She reports that she is forcing herself to pass gas but continues to feel rather bloated.  She has not had any vomiting, fever or severe abdominal pain.  She was concerned as the burning has remained present throughout the day.  She is also prescribed furosemide for fluid retention although she has not been weighing herself since her discharge from inpatient admission.  She was weighed here in clinic today and her weight has actually dropped 1 pound since her discharge.  She is scheduled to follow-up with cardiologist on 07/30/2020.  All vital signs are stable here today. 184. Lb today Salmon patty on wheat brisket Only taken   Past Medical History:  Diagnosis Date  . Anxiety   . Asthma    daily and prn inhalers  . Cervical disc disease    decreased range of motion  . Chronic pain    shoulders, neck, lower back  . Deformity, hand    left  . Depression   . Endometriosis    Lupron injection Q 3 mos.  . Fluid retention in legs   . Injury, brachial plexus    left  . Multiple allergies    takes allergy  shots  . PONV (postoperative nausea and vomiting)   . 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 Wears partial dentures    upper partial    Patient Active Problem List   Diagnosis Date Noted  . NSTEMI (non-ST elevated myocardial infarction) ([place]) 07/15/2020  . BV (bacterial vaginosis) 03/06/2013  . ANXIETY 11/05/2007  . DEPRESSION 11/05/2007  . NAUSEA, CHRONIC 11/05/2007  . FIBROIDS, UTERUS 03/26/2007  . TOBACCO ABUSE 03/26/2007  . MIGRAINE, COMMON 03/26/2007  . OVARIAN CYST 03/26/2007  . 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 [place] DISEASE, LUMBOSACRAL SPINE 03/26/2007  . 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    Past Surgical History:  Procedure Laterality Date  . ANKLE RECONSTRUCTION  02/01/2012   Procedure: RECONSTRUCTION ANKLE;  Surgeon: Wylene Simmer, MD;  Location: [facility];  Service: Orthopedics;  Laterality: Right;  Excision of right calcaneonavicular coalition with autologus fat graft interposition  . BRACHIAL PLEXUS EXPLORATION    . CORONARY STENT INTERVENTION N/A 07/15/2020   Procedure: CORONARY STENT INTERVENTION;  Surgeon: Jettie Booze, MD;  Location: [facility];  Service: Cardiovascular;  Laterality: N/A;  . DIAGNOSTIC LAPAROSCOPY  10/02/2008   peritoneal bx.  Marland Kitchen LEFT HEART CATH AND CORONARY ANGIOGRAPHY N/A 07/15/2020   Procedure: LEFT HEART CATH AND CORONARY ANGIOGRAPHY;  Surgeon:  Jettie Booze, MD;  Location: [facility];  Service: Cardiovascular;  Laterality: N/A;  . MULTIPLE TOOTH EXTRACTIONS     upper teeth and wisdom teeth  . OVARIAN CYST REMOVAL  2000  . RIB RESECTION     left - cervical rib removal  . SHOULDER ARTHROSCOPY W/ ROTATOR CUFF REPAIR  07/2011   right  . SHOULDER SURGERY     left  . TOOTH EXTRACTION     x 1    OB History    Gravida  0   Para  0   Term  0   Preterm  0   AB  0   Living  0     SAB  0   TAB  0   Ectopic  0   Multiple  0   Live  Births               Home Medications    Prior to Admission medications   Medication Sig Start Date End Date Taking? Authorizing Provider  acetaminophen (TYLENOL) 500 MG tablet Take 500 mg by mouth every 6 (six) hours as needed for moderate pain.    [provider]  albuterol (PROVENTIL HFA;VENTOLIN HFA) 108 (90 Base) MCG/ACT inhaler Inhale 2 puffs into the lungs every 4 (four) hours as needed for wheezing or shortness of breath (cough). 11/20/16   Street, [place], PA-C  aspirin EC 81 MG EC tablet Take 1 tablet (81 mg total) by mouth daily. Swallow whole. 07/17/20   Bhagat, Crista Luria, PA  beclomethasone (QVAR) 80 MCG/ACT inhaler Inhale 1 puff into the lungs 2 (two) times daily as needed (sob/wheezing).     [provider]  bismuth subsalicylate (PEPTO BISMOL) 262 MG/15ML suspension Take 30 mLs by mouth every 6 (six) hours as needed for indigestion.    [provider]  calcium carbonate (TUMS - DOSED IN MG ELEMENTAL CALCIUM) 500 MG chewable tablet Chew 1 tablet by mouth daily as needed for indigestion or heartburn.    [provider]  celecoxib (CELEBREX) 200 MG capsule Take 200 mg by mouth daily as needed for mild pain or moderate pain.  09/10/19   [provider]  divalproex (DEPAKOTE) 250 MG DR tablet Take 250 mg by mouth 2 (two) times daily. 10/10/19   [provider]  EPINEPHrine (EPIPEN JR) 0.15 MG/0.3ML injection Inject 0.15 mg into the muscle daily as needed for anaphylaxis.     [provider]  ergocalciferol (VITAMIN D2) 50000 UNITS capsule Take 50,000 Units by mouth once a week. Sundays    [provider]  furosemide (LASIX) 20 MG tablet Take 0.5 tablets (10 mg total) by mouth daily as needed for fluid. For swelling 07/19/20   Chandrasekhar, Mahesh A, MD  gabapentin (NEURONTIN) 600 MG tablet Take 600 mg by mouth at bedtime.     [provider]  hydrOXYzine (ATARAX/VISTARIL) 25 MG tablet Take 25 mg by  mouth 3 (three) times daily as needed for anxiety or itching.  10/09/19   [provider]  metoprolol tartrate (LOPRESSOR) 25 MG tablet Take 0.5 tablets (12.5 mg total) by mouth 2 (two) times daily. 07/16/20   Bhagat, Crista Luria, PA  nitroGLYCERIN (NITROSTAT) 0.4 MG SL tablet Place 1 tablet (0.4 mg total) under the tongue every 5 (five) minutes x 3 doses as needed for chest pain. 07/16/20   Bhagat, Crista Luria, PA  pantoprazole (PROTONIX) 40 MG tablet Take 40 mg by mouth daily as needed (heartburn).  11/19/19  [provider]  rosuvastatin (CRESTOR) 40 MG tablet Take 1 tablet (40 mg total) by mouth daily. 07/16/20   Leanor Kail, PA  ticagrelor (BRILINTA) 90 MG TABS tablet Take 1 tablet (90 mg total) by mouth 2 (two) times daily. 07/16/20   Bhagat, Crista Luria, PA  QUEtiapine (SEROQUEL) 100 MG tablet Take 100 mg by mouth at bedtime.   12/07/19  [provider]  sucralfate (CARAFATE) 1 GM/10ML suspension Take 10 mLs (1 g total) by mouth 4 (four) times daily -  with meals and at bedtime. Patient not taking: Reported on 04/11/2019 02/18/19 12/07/19  Franchot Heidelberg, PA-C    Family History Family History  Problem Relation Age of Onset  . Cancer Mother   . Dementia Father   . Stroke Father     Social History Social History   Tobacco Use  . Smoking status: Current Every Day Smoker    Packs/day: 1.00    Years: 20.00    Pack years: 20.00    Types: Cigarettes  . Smokeless tobacco: Never Used  Vaping Use  . Vaping Use: Never used  Substance Use Topics  . Alcohol use: No    Alcohol/week: 0.0 standard drinks    Comment: occasional  . Drug use: No     Allergies   Pfizer-biontech covid-19 vacc [covid-19 mrna vacc (moderna)], Chocolate, Ivp dye [iodinated diagnostic agents], Latex, Shellfish allergy, Fish-derived products, Peanut-containing drug products, and Soap   Review of Systems Review of Systems   Physical Exam Triage Vital Signs ED Triage Vitals  [07/24/20 1745]  Enc Vitals Group     BP 128/69     Pulse Rate 64     Resp      Temp 98.9 F (37.2 C)     Temp Source Oral     SpO2 100 %     Weight      Height      Head Circumference      Peak Flow      Pain Score      Pain Loc      Pain Edu?      Excl. in [place]?    No data found.  Updated Vital Signs BP 128/69 (BP Location: Right Arm)   Pulse 64   Temp 98.9 F (37.2 C) (Oral)   LMP 07/12/2016 (Approximate) Comment: 2-3 day cycle  SpO2 100%   Visual Acuity Right Eye Distance:   Left Eye Distance:   Bilateral Distance:    Right Eye Near:   Left Eye Near:    Bilateral Near:     Physical Exam General appearance: alert, well developed, well nourished, cooperative and no distress Head: Normocephalic, without obvious abnormality, atraumatic Respiratory: Respirations even and unlabored, normal respiratory rate, no rhonchi or crackles Heart: rate and rhythm normal. No gallop or murmurs noted on exam  Abdomen: BS +, positive distention, no palpable reproducible tenderness, no rebound tenderness, or no mass Extremities: Negative for bilateral lower extremity edema Skin: Skin color, texture, turgor normal. No rashes seen  Psych: Appropriate mood and affect. Neurologic: Mental status: Alert, oriented to person, place, and time, thought content appropriate.   UC Treatments / Results  Labs (all labs ordered are listed, but only abnormal results are displayed) Labs Reviewed  POCT URINALYSIS DIPSTICK, ED / UC - Abnormal; Notable for the following components:      Result Value   Hgb urine dipstick TRACE (*)    All other components within normal limits  URINE CULTURE  EKG   Radiology No results found.  Procedures Procedures (including critical care time)  Medications Ordered in UC Medications - No data to display  Initial Impression / Assessment and Plan / UC Course  I have reviewed the triage vital signs and the nursing notes.  Pertinent labs & imaging  results that were available during my care of the patient were reviewed by me and considered in my medical decision making (see chart for details).    Treating today for acid indigestion causing bloating with mild constipation.  Patient received a GI cocktail while here in clinic and tolerated.  Patient is also prescribed Protonix however is unclear if she is actually taking medication daily for indigestion and acid related symptoms.  She admits that she has not taken any of that type of medication today.  Will prescribe famotidine 20 mg as needed advised only to take this medication if she were eating any foods such as fried foods or drinking any carbonated type beverages that may be irritating to the stomach.  UA collected given abdominal pain rule out UTI, UA trace RBC. Urine Culture pending to rule out infection given recent hospitalization and non specific abdominal pain. Also feel that some of her problems today are related to constipation as she has not had a full bulky bowel movement since leaving the hospital.  She reports bowel movements have been only scant in volume.  Therefore we will trial her on MiraLAX daily until she achieves a successful bowel movement.  Patient also has furosemide advised to start taking 1 dose tomorrow followed by subsequent dose the next day if symptoms of bloating have been relieved discontinue and resume back to as needed use for fluid retention and swelling.  Strict ER precautions given if any of your symptoms worsen or do not improve with treatment prescribed here today.  Patient did verbalize understanding and agreement with plan.  Patient had a family member on the phone on speaker during encounter who listened during the encounter today.  Patient stable for discharge. Final Clinical Impressions(s) / UC Diagnoses   Final diagnoses:  Acid indigestion  Abdominal bloating  Acute esophagitis     Discharge Instructions     You were treated with a abdominal acid  GI cocktail today in clinic.  I would like for you to resume taking your furosemide tomorrow and the following day because I do believe you are having a mild case of some fluid retention in your abdomen which is resulting in bloating.  Also start Pepcid 20 mg twice daily as needed when eating any fried or foods high in acid.  This will decrease the likelihood that she will experience bloating and abdominal burning. Also start MiraLAX as you need to have a good bulky bowel movement this will also help relieve bloating and decrease the pain that you are experiencing in your back which is likely related to constipation.  I placed a referral for you to be seen over at [facility] gastroenterology someone from their office will contact you to schedule an appointment.  If any of your symptoms worsen or do not improve follow-up at the emergency department.    ED Prescriptions    Medication Sig Dispense Auth. Provider   polyethylene glycol (MIRALAX) 17 g packet Take 17 g by mouth daily. 14 each Scot Jun, FNP   famotidine (PEPCID) 20 MG tablet Take 1 tablet (20 mg total) by mouth 2 (two) times daily as needed for heartburn or indigestion. 30 tablet  Scot Jun, FNP     PDMP not reviewed this encounter.   Scot Jun, FNP 07/24/20 1848    Scot Jun, FNP 08/29/20 [phone]

## 2020-07-24 NOTE — Discharge Instructions (Signed)
You were treated with a abdominal acid GI cocktail today in clinic.  I would like for you to resume taking your furosemide tomorrow and the following day because I do believe you are having a mild case of some fluid retention in your abdomen which is resulting in bloating.  Also start Pepcid 20 mg twice daily as needed when eating any fried or foods high in acid.  This will decrease the likelihood that she will experience bloating and abdominal burning. Also start MiraLAX as you need to have a good bulky bowel movement this will also help relieve bloating and decrease the pain that you are experiencing in your back which is likely related to constipation.  I placed a referral for you to be seen over at [facility] gastroenterology someone from their office will contact you to schedule an appointment.  If any of your symptoms worsen or do not improve follow-up at the emergency department.

## 2020-07-24 NOTE — Telephone Encounter (Signed)
Cardiology Moon[facility]ighter Note  Returned page from patient. Went to urgent care earlier today for abdominal pain as instructed. No cause identified. Still having symptoms similar to what was described in previous note. I informed her that it would be unlikely for her recent coronary stents to be causing abdominal pain, but if her symptoms are worsening or persistent and she is worried about these then it would be reasonable for her to come to the emergency department for an evaluation. She agrees.   Marcie Mowers, MD Cardiology Fellow, PGY-8

## 2020-07-25 LAB — URINE CULTURE
Culture: NO GROWTH
Special Requests: NORMAL

## 2020-07-30 ENCOUNTER — Other Ambulatory Visit: Payer: Self-pay

## 2020-07-30 ENCOUNTER — Encounter: Payer: Self-pay | Admitting: Internal Medicine

## 2020-07-30 ENCOUNTER — Telehealth: Payer: Self-pay

## 2020-07-30 ENCOUNTER — Telehealth: Payer: Self-pay | Admitting: Internal Medicine

## 2020-07-30 ENCOUNTER — Ambulatory Visit: Payer: Medicare Other | Admitting: Internal Medicine

## 2020-07-30 VITALS — BP 120/68 | HR 76 | Ht 59.0 in | Wt 185.0 lb

## 2020-07-30 DIAGNOSIS — I2510 Atherosclerotic heart disease of native coronary artery without angina pectoris: Secondary | ICD-10-CM

## 2020-07-30 DIAGNOSIS — K219 Gastro-esophageal reflux disease without esophagitis: Secondary | ICD-10-CM | POA: Diagnosis not present

## 2020-07-30 DIAGNOSIS — I10 Essential (primary) hypertension: Secondary | ICD-10-CM

## 2020-07-30 DIAGNOSIS — E785 Hyperlipidemia, unspecified: Secondary | ICD-10-CM | POA: Insufficient documentation

## 2020-07-30 DIAGNOSIS — I340 Nonrheumatic mitral (valve) insufficiency: Secondary | ICD-10-CM | POA: Insufficient documentation

## 2020-07-30 DIAGNOSIS — R9431 Abnormal electrocardiogram [ECG] [EKG]: Secondary | ICD-10-CM | POA: Insufficient documentation

## 2020-07-30 NOTE — Telephone Encounter (Signed)
Pts office visit note from 07/30/20 with Dr. Gasper Sells faxed to her Psychiatrist at [facility] per her verbal request at her appt today.

## 2020-07-30 NOTE — Progress Notes (Signed)
Cardiology Office Note:    Date:  07/30/2020   ID:  Wendy [name], DOB October 19, 1965, MRN [id number]  PCP:  Lucianne Lei, MD  [facility] Cardiologist:  Werner Lean, MD  [place] Electrophysiologist:  None   Referring MD: Lucianne Lei, MD   Follow up:  NSTEMI Hospitalization  History of Present Illness:    Wendy [name] is a 54 y.o. female with a hx of CAD (NSTEMI 07/16/20 with mRCA DES, 50% pRCA; OM1 PCI, D1 50%) with preserved EF, HTN, HLD, Former Tobacco Use, GERD, who presented 07/15/20 for NSTEMI.   Patient notes that she is very anxious that she has a heart condition.  Notes that her chest pain, jaw pain, and L shoulder pain. Hasn't have issues with this pain since PCI. No chest pressure, syncope or near syncope.  No SOB, DOE, PND, Orthopnea. No weight gain.  No bleeding issuesHad reflux but this is better with PPI.  Has stopped smoking.  Has had to use the fluid pill twice.    Ambulatory BP 120/68.  Past Medical History:  Diagnosis Date  . Anxiety   . Asthma    daily and prn inhalers  . Cervical disc disease    decreased range of motion  . Chronic pain    shoulders, neck, lower back  . Deformity, hand    left  . Depression   . Endometriosis    Lupron injection Q 3 mos.  . Fluid retention in legs   . Injury, brachial plexus    left  . Multiple allergies    takes allergy shots  . PONV (postoperative nausea and vomiting)   . 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 Wears partial dentures    upper partial    Past Surgical History:  Procedure Laterality Date  . ANKLE RECONSTRUCTION  02/01/2012   Procedure: RECONSTRUCTION ANKLE;  Surgeon: Wylene Simmer, MD;  Location: [facility];  Service: Orthopedics;  Laterality: Right;  Excision of right calcaneonavicular coalition with autologus fat graft interposition  . BRACHIAL PLEXUS EXPLORATION    . CORONARY STENT INTERVENTION N/A 07/15/2020   Procedure: CORONARY  STENT INTERVENTION;  Surgeon: Jettie Booze, MD;  Location: [facility];  Service: Cardiovascular;  Laterality: N/A;  . DIAGNOSTIC LAPAROSCOPY  10/02/2008   peritoneal bx.  Marland Kitchen LEFT HEART CATH AND CORONARY ANGIOGRAPHY N/A 07/15/2020   Procedure: LEFT HEART CATH AND CORONARY ANGIOGRAPHY;  Surgeon: Jettie Booze, MD;  Location: [place] [facility];  Service: Cardiovascular;  Laterality: N/A;  . MULTIPLE TOOTH EXTRACTIONS     upper teeth and wisdom teeth  . OVARIAN CYST REMOVAL  2000  . RIB RESECTION     left - cervical rib removal  . SHOULDER ARTHROSCOPY W/ ROTATOR CUFF REPAIR  07/2011   right  . SHOULDER SURGERY     left  . TOOTH EXTRACTION     x 1   Current Medications: Current Meds  Medication Sig  . acetaminophen (TYLENOL) 500 MG tablet Take 500 mg by mouth every 6 (six) hours as needed for moderate pain.  Marland Kitchen albuterol (PROVENTIL HFA;VENTOLIN HFA) 108 (90 Base) MCG/ACT inhaler Inhale 2 puffs into the lungs every 4 (four) hours as needed for wheezing or shortness of breath (cough).  Marland Kitchen aspirin EC 81 MG EC tablet Take 1 tablet (81 mg total) by mouth daily. Swallow whole.  . beclomethasone (QVAR) 80 MCG/ACT inhaler Inhale 1 puff into the lungs 2 (two) times daily  as needed (sob/wheezing).   . bismuth subsalicylate (PEPTO BISMOL) 262 MG/15ML suspension Take 30 mLs by mouth every 6 (six) hours as needed for indigestion.  . calcium carbonate (TUMS - DOSED IN MG ELEMENTAL CALCIUM) 500 MG chewable tablet Chew 1 tablet by mouth daily as needed for indigestion or heartburn.  . celecoxib (CELEBREX) 200 MG capsule Take 200 mg by mouth daily as needed for mild pain or moderate pain.   Marland Kitchen EPINEPHrine (EPIPEN JR) 0.15 MG/0.3ML injection Inject 0.15 mg into the muscle daily as needed for anaphylaxis.   Marland Kitchen ergocalciferol (VITAMIN D2) 50000 UNITS capsule Take 50,000 Units by mouth once a week. Sundays  . famotidine (PEPCID) 20 MG tablet Take 1 tablet (20 mg total) by mouth 2 (two) times  daily as needed for heartburn or indigestion.  . furosemide (LASIX) 20 MG tablet Take 0.5 tablets (10 mg total) by mouth daily as needed for fluid. For swelling  . gabapentin (NEURONTIN) 600 MG tablet Take 600 mg by mouth at bedtime.   . hydrOXYzine (ATARAX/VISTARIL) 25 MG tablet Take 25 mg by mouth 3 (three) times daily as needed for anxiety or itching.   . metoprolol tartrate (LOPRESSOR) 25 MG tablet Take 0.5 tablets (12.5 mg total) by mouth 2 (two) times daily.  . nitroGLYCERIN (NITROSTAT) 0.4 MG SL tablet Place 1 tablet (0.4 mg total) under the tongue every 5 (five) minutes x 3 doses as needed for chest pain.  . pantoprazole (PROTONIX) 40 MG tablet Take 40 mg by mouth daily as needed (heartburn).   . polyethylene glycol (MIRALAX) 17 g packet Take 17 g by mouth daily.  . rosuvastatin (CRESTOR) 40 MG tablet Take 1 tablet (40 mg total) by mouth daily.  . ticagrelor (BRILINTA) 90 MG TABS tablet Take 1 tablet (90 mg total) by mouth 2 (two) times daily.     Allergies:   Pfizer-biontech covid-19 vacc [covid-19 mrna vacc (moderna)], Chocolate, Ivp dye [iodinated diagnostic agents], Latex, Shellfish allergy, Fish-derived products, Peanut-containing drug products, and Soap   Social History   Socioeconomic History  . Marital status: Single    Spouse name: Not on file  . Number of children: Not on file  . Years of education: Not on file  . Highest education level: Not on file  Occupational History  . Not on file  Tobacco Use  . Smoking status: Current Every Day Smoker    Packs/day: 1.00    Years: 20.00    Pack years: 20.00    Types: Cigarettes  . Smokeless tobacco: Never Used  Vaping Use  . Vaping Use: Never used  Substance and Sexual Activity  . Alcohol use: No    Alcohol/week: 0.0 standard drinks    Comment: occasional  . Drug use: No  . Sexual activity: Yes    Partners: Male    Birth control/protection: None  Other Topics Concern  . Not on file  Social History Narrative  . Not  on file   Social Determinants of Health   Financial Resource Strain:   . Difficulty of Paying Living Expenses: Not on file  Food Insecurity:   . Worried About Charity fundraiser in the Last Year: Not on file  . Ran Out of Food in the Last Year: Not on file  Transportation Needs:   . Lack of Transportation (Medical): Not on file  . Lack of Transportation (Non-Medical): Not on file  Physical Activity:   . Days of Exercise per Week: Not on file  . Minutes of  Exercise per Session: Not on file  Stress:   . Feeling of Stress : Not on file  Social Connections:   . Frequency of Communication with Friends and Family: Not on file  . Frequency of Social Gatherings with Friends and Family: Not on file  . Attends Religious Services: Not on file  . Active Member of Clubs or Organizations: Not on file  . Attends Archivist Meetings: Not on file  . Marital Status: Not on file    Family History: The patient's family history includes Cancer in her mother; Dementia in her father; Stroke in her father.  ROS:   Please see the history of present illness.     All other systems reviewed and are negative.  EKGs/Labs/Other Studies Reviewed:    The following studies were reviewed today:  EKG:   07/19/2020: Sinus Rhythm 84 QTc 508  Recent Labs: 12/12/2019: ALT 11 07/15/2020: B Natriuretic Peptide 135.9; TSH 0.618 07/16/2020: BUN 9; Creatinine, Ser 0.88; Hemoglobin 12.4; Platelets 243; Potassium 3.7; Sodium 139  Recent Lipid Panel    Component Value Date/Time   CHOL 248 (H) 07/15/2020 1730   TRIG 46 07/15/2020 1730   HDL 80 07/15/2020 1730   CHOLHDL 3.1 07/15/2020 1730   VLDL 9 07/15/2020 1730   LDLCALC 159 (H) 07/15/2020 1730   07/16/20 Echo Mild to moderate MR; possible posterior restriction IMPRESSIONS  1. Left ventricular ejection fraction, by estimation, is 55 to 60%. The  left ventricle has normal function. The left ventricle has no regional  wall motion abnormalities. Left  ventricular diastolic parameters are  indeterminate.  2. Right ventricular systolic function is normal. The right ventricular  size is normal. There is normal pulmonary artery systolic pressure.  3. MR is eccentric, directed posterior into LA. The mitral valve is  normal in structure. Mild to moderate mitral valve regurgitation.  4. The aortic valve is normal in structure. Aortic valve regurgitation is  mild to moderate.   07/16/20 LHC  Prox RCA lesion is 50% stenosed. Vasospasm noted on this lesion which improved with IC NTG.  Mid RCA lesion is 99% stenosed. This was the culprit lesion which was ulcerated.  A drug-eluting stent was successfully placed using a STENT RESOLUTE ONYX 3.0X15, postdilated to 3.25 mm.  Post intervention, there is a 0% residual stenosis.  1st Mrg lesion is 80% stenosed.  A drug-eluting stent was successfully placed using a STENT RESOLUTE ONYX 2.5X18, postdilated to 3.0 mm.  Post intervention, there is a 0% residual stenosis.  Prox Cx lesion is 25% stenosed.  1st Diag lesion is 50% stenosed.  Prox LAD lesion is 25% stenosed.  The left ventricular systolic function is normal.  LV end diastolic pressure is moderately elevated.  The left ventricular ejection fraction is 55-65% by visual estimate.  There is no aortic valve stenosis.  Physical Exam:    VS:  BP 120/68   Pulse 76   Ht 4\' 11"  (1.499 m)   Wt 185 lb (83.9 kg)   LMP 07/12/2016 (Approximate) Comment: 2-3 day cycle  SpO2 98%   BMI 37.37 kg/m     Wt Readings from Last 3 Encounters:  07/30/20 185 lb (83.9 kg)  07/24/20 184 lb (83.5 kg)  07/16/20 185 lb 6.4 oz (84.1 kg)    GEN: Well nourished, well developed in no acute distress HEENT: Normal NECK: No JVD; No carotid bruits LYMPHATICS: No lymphadenopathy CARDIAC: RRR, holosystolic murmur, rubs, gallops RESPIRATORY:  Clear to auscultation without rales, wheezing or rhonchi  ABDOMEN: Soft, non-tender,  non-distended MUSCULOSKELETAL:  No edema; No deformity  SKIN: Warm and dry NEUROLOGIC:  Alert and oriented x 3 PSYCHIATRIC:  Normal affect   ASSESSMENT:    1. Coronary artery disease involving native coronary artery of native heart without angina pectoris   2. Essential hypertension   3. Hyperlipidemia, unspecified hyperlipidemia type   4. Gastroesophageal reflux disease without esophagitis   5. Nonrheumatic mitral valve regurgitation   6. Prolonged Q-T interval on ECG    PLAN:    In order of problems listed above:  Coronary Artery Disease; Obstructive GERD HLD - asymptomatic - anatomy: 50% pRCA, mRCA PCI, OM1 PCI, pLCX 25%, D1 50%, pLAD 25% - continue ASA 81 mg; Continue Ticagrelor until 07/2020; no bleeding issues; if cost becomes an issues will switch to plavix - continue statin, goal LDL < 70; will recheck lipids in 3 months - continue BB - continue nitrates PRN - discussed cardiac rehab; pending start - nutrition referral  Mitral Vale Regurgitation - mild to moderate regurgitation - stable on PRN lasix  Prolonged QTc - no morphological syndrome - prior iatrogenic causes could include seroquel - will [facility] psychiatry team and will recheck at next visit  Essential Hypertension- ambulatory blood pressure, will start/continue ambulatory BP monitoring; gave education on how to perform ambulatory blood pressure monitoring including the frequency and technique; goal ambulatory blood pressure < 135/85 on average - continue home medications   Tobacco Abuse- Quit - discussed the dangers of tobacco use, both inhaled and oral, which include, but are not limited to cardiovascular disease, increased cancer risk of multiple types of cancer, COPD, peripheral arterial disease, strokes. - counseled on the benefits of smoking cessation. - firmly advised to quit.  - we also reviewed strategies to maximize success, including:  Removing cigarettes and smoking materials from environment    Stress management  Substitution of other forms of reinforcement (the one cigarette a day approach)  Support of family/friends and group smoking cessation  Selecting a quit date.  Patient provided contact information for [facility] or 1-800-QUIT-NOW  Patient provided with [place]'s 8 free smoking cessation classes: [phone] and CarWashShow.fr  - would benefit from Wellbutrin; has concomitant depression; will CC patient's Psychiatry  3 months follow up unless new symptoms or abnormal test results warranting change in plan  Would be reasonable for  APP Follow up   Shared Decision Making/Informed Consent        Medication Adjustments/Labs and Tests Ordered: Current medicines are reviewed at length with the patient today.  Concerns regarding medicines are outlined above.  Orders Placed This Encounter  Procedures  . Lipid Profile  . Amb Ref to Medical Weight Management   No orders of the defined types were placed in this encounter.   Patient Instructions  Medication Instructions:  Your physician recommends that you continue on your current medications as directed. Please refer to the Current Medication list given to you today.  *If you need a refill on your cardiac medications before your next appointment, please call your pharmacy*   Lab Work: Lipids... fasting in 3 months prior to your office visit   If you have labs (blood work) drawn today and your tests are completely normal, you will receive your results only by: Marland Kitchen MyChart Message (if you have MyChart) OR . A paper copy in the mail If you have any lab test that is abnormal or we need to change your treatment, we will call you to review the results.  Testing/Procedures: none   Follow-Up: At [facility], you and your health needs are our priority.  As part of our continuing mission to provide you with exceptional heart care, we have created designated Provider Care Teams.  These  Care Teams include your primary Cardiologist (physician) and Advanced Practice Providers (APPs -  Physician Assistants and Nurse Practitioners) who all work together to provide you with the care you need, when you need it.  We recommend signing up for the patient portal called "MyChart".  Sign up information is provided on this After Visit Summary.  MyChart is used to connect with patients for Virtual Visits (Telemedicine).  Patients are able to view lab/test results, encounter notes, upcoming appointments, etc.  Non-urgent messages can be sent to your provider as well.   To learn more about what you can do with MyChart, go to [email or web address].    Your next appointment:   3 month(s)  The format for your next appointment:   In Person  Provider:   Rudean Haskell, MD   Other Instructions:  Referral to Nutritionist      Signed, Werner Lean, MD  07/30/2020 9:41 AM    [place]

## 2020-07-30 NOTE — Patient Instructions (Addendum)
Medication Instructions:  Your physician recommends that you continue on your current medications as directed. Please refer to the Current Medication list given to you today.  *If you need a refill on your cardiac medications before your next appointment, please call your pharmacy*   Lab Work: Lipids... fasting in 3 months prior to your office visit   If you have labs (blood work) drawn today and your tests are completely normal, you will receive your results only by: Marland Kitchen MyChart Message (if you have MyChart) OR . A paper copy in the mail If you have any lab test that is abnormal or we need to change your treatment, we will call you to review the results.   Testing/Procedures: none   Follow-Up: At [facility], you and your health needs are our priority.  As part of our continuing mission to provide you with exceptional heart care, we have created designated Provider Care Teams.  These Care Teams include your primary Cardiologist (physician) and Advanced Practice Providers (APPs -  Physician Assistants and Nurse Practitioners) who all work together to provide you with the care you need, when you need it.  We recommend signing up for the patient portal called "MyChart".  Sign up information is provided on this After Visit Summary.  MyChart is used to connect with patients for Virtual Visits (Telemedicine).  Patients are able to view lab/test results, encounter notes, upcoming appointments, etc.  Non-urgent messages can be sent to your provider as well.   To learn more about what you can do with MyChart, go to [email or web address].    Your next appointment:   3 month(s)  The format for your next appointment:   In Person  Provider:   Rudean Haskell, MD   Other Instructions:  Referral to Nutritionist

## 2020-07-30 NOTE — Telephone Encounter (Signed)
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

## 2020-07-30 NOTE — Telephone Encounter (Signed)
Patient just left appt and forgot to ask about if she can return to work with an restrictions and to put it in [place]. Please call

## 2020-08-02 DIAGNOSIS — M5136 Other intervertebral disc degeneration, lumbar region: Secondary | ICD-10-CM | POA: Diagnosis not present

## 2020-08-02 DIAGNOSIS — M542 Cervicalgia: Secondary | ICD-10-CM | POA: Diagnosis not present

## 2020-08-02 DIAGNOSIS — M5412 Radiculopathy, cervical region: Secondary | ICD-10-CM | POA: Diagnosis not present

## 2020-08-09 MED FILL — METOPROLOL TARTRATE 25 MG T: 25 | 30 days supply | Qty: 30 | Fill #1

## 2020-08-10 ENCOUNTER — Telehealth (HOSPITAL_COMMUNITY): Payer: Self-pay

## 2020-08-10 NOTE — Telephone Encounter (Signed)
Called patient to see if she was interested in participating in the Cardiac Rehab Program. Patient stated yes. Patient will come in for orientation on 09/14/20 @ 130PM and will attend the 130PM exercise class.  Tourist information centre manager.

## 2020-08-11 MED FILL — BRILINTA 90 MG TABLET: 90 | 30 days supply | Qty: 60 | Fill #1

## 2020-08-11 MED FILL — ROSUVASTATIN CALCIUM 40 MG: 40 | 30 days supply | Qty: 30 | Fill #1

## 2020-08-24 ENCOUNTER — Other Ambulatory Visit: Payer: Self-pay | Admitting: Internal Medicine

## 2020-09-06 ENCOUNTER — Telehealth (HOSPITAL_COMMUNITY): Payer: Self-pay

## 2020-09-07 MED FILL — ROSUVASTATIN CALCIUM 40 MG: 40 | 30 days supply | Qty: 30 | Fill #2

## 2020-09-07 MED FILL — METOPROLOL TARTRATE 25 MG T: 25 | 30 days supply | Qty: 30 | Fill #2

## 2020-09-07 MED FILL — BRILINTA 90 MG TABLET: 90 | 30 days supply | Qty: 60 | Fill #2

## 2020-09-07 MED FILL — NITROGLYCERIN 0.4 MG TAB SL: 0.4 | 25 days supply | Qty: 25 | Fill #1

## 2020-09-08 ENCOUNTER — Encounter (HOSPITAL_COMMUNITY): Payer: Self-pay | Admitting: *Deleted

## 2020-09-08 NOTE — Progress Notes (Signed)
Called pt and completed health history for orientation to [facility] and reminded of appointment time. We discussed directions to the department, mask, proper shoes, covid symptoms and our contact number. She voices understanding.

## 2020-09-12 NOTE — Telephone Encounter (Signed)
Cardiac Rehab - Pharmacy Resident Documentation   Patient unable to be reached after three call attempts. Please complete allergy verification and medication review during patient's cardiac rehab appointment.    Sanda Klein, PharmD, RPh  PGY-1 Pharmacy Resident 09/12/2020 5:07 PM  Please check [email or web address] for unit-specific pharmacy phone numbers.

## 2020-09-13 ENCOUNTER — Telehealth (HOSPITAL_COMMUNITY): Payer: Self-pay

## 2020-09-13 NOTE — Telephone Encounter (Signed)
Pt insurance is active and benefits verified through [facility] Medicare Co-pay 0, DED 0/0 met, out of pocket $3,600/0 met, co-insurance 0%. no pre-authorization required. Passport, 09/13/2020@10 :41am, REF# [id number]

## 2020-09-14 ENCOUNTER — Telehealth (HOSPITAL_COMMUNITY): Payer: Self-pay | Admitting: *Deleted

## 2020-09-14 ENCOUNTER — Encounter (HOSPITAL_COMMUNITY): Payer: Self-pay

## 2020-09-14 ENCOUNTER — Other Ambulatory Visit: Payer: Self-pay

## 2020-09-14 ENCOUNTER — Encounter (HOSPITAL_COMMUNITY)
Admission: RE | Admit: 2020-09-14 | Discharge: 2020-09-14 | Disposition: A | Discharge status: Home / Self Care | Payer: Medicare Other | Source: Ambulatory Visit | Attending: Internal Medicine | Admitting: Internal Medicine

## 2020-09-14 VITALS — BP 116/52 | Ht 58.75 in | Wt 183.9 lb

## 2020-09-14 DIAGNOSIS — Z955 Presence of coronary angioplasty implant and graft: Secondary | ICD-10-CM | POA: Insufficient documentation

## 2020-09-14 DIAGNOSIS — I214 Non-ST elevation (NSTEMI) myocardial infarction: Secondary | ICD-10-CM | POA: Insufficient documentation

## 2020-09-14 HISTORY — DX: Atherosclerotic heart disease of native coronary artery without angina pectoris: I25.10

## 2020-09-14 HISTORY — DX: Essential (primary) hypertension: I10

## 2020-09-14 HISTORY — DX: Hyperlipidemia, unspecified: E78.5

## 2020-09-14 NOTE — Telephone Encounter (Signed)
Pt called to inform rehab staff that her covid test was negative.  Pt has a paper copy of the results and will bring to her appt later today.  Pt is presently in line to get her covid booster injection. Reviewed directions to our department. Pt verbalizes understanding. Alanson Aly, BSN Cardiac and Emergency planning/management officer

## 2020-09-14 NOTE — Telephone Encounter (Signed)
Return call from message left on voicemail regarding not hearing back from her Covid test.  Left message that since her appt is later today should she receive her results by 12:30 to please give [place] a call.  If in the event she does not receive her result today but tomorrow or Thursday morning and the result is negative, can reschedule for Thursday. Contact information provided. Alanson Aly, BSN Cardiac and Emergency planning/management officer

## 2020-09-15 ENCOUNTER — Encounter (HOSPITAL_COMMUNITY): Payer: Self-pay

## 2020-09-15 ENCOUNTER — Telehealth: Payer: Self-pay

## 2020-09-15 MED ORDER — METOPROLOL TARTRATE 25 MG PO TABS: 25.0000 mg | ORAL_TABLET | Freq: Two times a day (BID) | ORAL | 6 refills | Status: DC

## 2020-09-15 NOTE — Progress Notes (Signed)
Cardiac Individual Treatment Plan  Patient Details  Name: Wendy [name] MRN: [id number] Date of Birth: [birth year] Referring Provider:   Flowsheet Row CARDIAC REHAB PHASE II ORIENTATION from 09/14/2020 in [place]  Referring Provider Dr. Rudean Haskell, MD      Initial Encounter Date:  [place] II ORIENTATION from 09/14/2020 in [place]  Date 09/14/20      Visit Diagnosis: NSTEMI (non-ST elevated myocardial infarction) [facility]) 07/14/20  07/15/20 S/P DES RCA, OM1  Patient's Home Medications on Admission:  Current Outpatient Medications:  .  acetaminophen (TYLENOL) 500 MG tablet, Take 500 mg by mouth every 6 (six) hours as needed for moderate pain., Disp: , Rfl:  .  albuterol (PROVENTIL HFA;VENTOLIN HFA) 108 (90 Base) MCG/ACT inhaler, Inhale 2 puffs into the lungs every 4 (four) hours as needed for wheezing or shortness of breath (cough)., Disp: 1 Inhaler, Rfl: 0 .  aspirin EC 81 MG EC tablet, Take 1 tablet (81 mg total) by mouth daily. Swallow whole., Disp: 30 tablet, Rfl: 11 .  beclomethasone (QVAR) 80 MCG/ACT inhaler, Inhale 1 puff into the lungs 2 (two) times daily as needed (sob/wheezing). , Disp: , Rfl:  .  calcium carbonate (TUMS - DOSED IN MG ELEMENTAL CALCIUM) 500 MG chewable tablet, Chew 1 tablet by mouth daily as needed for indigestion or heartburn., Disp: , Rfl:  .  celecoxib (CELEBREX) 200 MG capsule, Take 200 mg by mouth daily as needed for mild pain or moderate pain. , Disp: , Rfl:  .  EPINEPHrine (EPIPEN JR) 0.15 MG/0.3ML injection, Inject 0.15 mg into the muscle daily as needed for anaphylaxis. , Disp: , Rfl:  .  ergocalciferol (VITAMIN D2) 50000 UNITS capsule, Take 50,000 Units by mouth once a week. Sundays, Disp: , Rfl:  .  furosemide (LASIX) 20 MG tablet, TAKE 1/2 TABLET(10 MG) BY MOUTH DAILY AS NEEDED FOR FLUID RETENTION OR SWELLING, Disp: 45 tablet, Rfl: 3 .  gabapentin  (NEURONTIN) 600 MG tablet, Take 600 mg by mouth at bedtime., Disp: , Rfl:  .  hydrOXYzine (ATARAX/VISTARIL) 25 MG tablet, Take 25 mg by mouth 3 (three) times daily as needed for anxiety or itching. , Disp: , Rfl:  .  nitroGLYCERIN (NITROSTAT) 0.4 MG SL tablet, Place 1 tablet (0.4 mg total) under the tongue every 5 (five) minutes x 3 doses as needed for chest pain., Disp: 25 tablet, Rfl: 12 .  polyethylene glycol (MIRALAX) 17 g packet, Take 17 g by mouth daily., Disp: 14 each, Rfl: 0 .  rosuvastatin (CRESTOR) 40 MG tablet, Take 1 tablet (40 mg total) by mouth daily., Disp: 30 tablet, Rfl: 6 .  ticagrelor (BRILINTA) 90 MG TABS tablet, Take 1 tablet (90 mg total) by mouth 2 (two) times daily., Disp: 60 tablet, Rfl: 11 .  bismuth subsalicylate (PEPTO BISMOL) 262 MG/15ML suspension, Take 30 mLs by mouth every 6 (six) hours as needed for indigestion. (Patient not taking: Reported on 09/14/2020), Disp: , Rfl:  .  famotidine (PEPCID) 20 MG tablet, Take 1 tablet (20 mg total) by mouth 2 (two) times daily as needed for heartburn or indigestion. (Patient not taking: Reported on 09/14/2020), Disp: 30 tablet, Rfl: 0 .  metoprolol tartrate (LOPRESSOR) 25 MG tablet, Take 1 tablet (25 mg total) by mouth 2 (two) times daily., Disp: 30 tablet, Rfl: 6 .  pantoprazole (PROTONIX) 40 MG tablet, Take 40 mg by mouth daily as needed (heartburn).  (Patient not taking: Reported  on 09/14/2020), Disp: , Rfl:   Past Medical History: Past Medical History:  Diagnosis Date  . Anxiety   . Asthma    daily and prn inhalers  . Cervical disc disease    decreased range of motion  . Chronic pain    shoulders, neck, lower back  . Coronary artery disease   . Deformity, hand    left  . Depression   . Endometriosis    Lupron injection Q 3 mos.  . Fluid retention in legs   . Hyperlipidemia   . Hypertension   . Injury, brachial plexus    left  . Multiple allergies    takes allergy shots  . PONV (postoperative nausea and vomiting)    . 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 Wears partial dentures    upper partial    Tobacco Use: Social History   Tobacco Use  Smoking Status Former Smoker  . Packs/day: 1.00  . Years: 20.00  . Pack years: 20.00  . Types: Cigarettes  . Quit date: 07/14/2020  . Years since quitting: 0.1  Smokeless Tobacco Never Used    Labs: Recent Review Advice worker    Labs for ITP Cardiac and Pulmonary Rehab Latest Ref Rng & Units 04/13/2008 07/15/2020   Cholestrol 0 - 200 mg/dL - 465(K)   LDLCALC 0 - 99 mg/dL - 354(S)   HDL >56 mg/dL - 80   Trlycerides <812 mg/dL - 46   Hemoglobin X5T 4.8 - 5.6 % - 5.0   TCO2 - 19 -      Capillary Blood Glucose: No results found for: GLUCAP   Exercise Target Goals: Exercise Program Goal: Individual exercise prescription set using results from initial 6 min walk test and THRR while considering  patient's activity barriers and safety.   Exercise Prescription Goal: Starting with aerobic activity 30 plus minutes a day, 3 days per week for initial exercise prescription. Provide home exercise prescription and guidelines that participant acknowledges understanding prior to discharge.  Activity Barriers & Risk Stratification:  Activity Barriers & Cardiac Risk Stratification - 09/14/20 1510      Activity Barriers & Cardiac Risk Stratification   Activity Barriers Arthritis;Back Problems;Neck/Spine Problems;Joint Problems;Deconditioning;Shortness of Breath    Cardiac Risk Stratification High           6 Minute Walk:  6 Minute Walk    Row Name 09/14/20 1506         6 Minute Walk   Distance 800 feet     Walk Time 6 minutes     # of Rest Breaks 1  5:03 - 6:00 (57 seconds)     MPH 1.51     METS 2.21     RPE 13     Perceived Dyspnea  2     VO2 Peak 7.73     Symptoms Yes (comment)     Comments Low back pain/bilateral quadriceps pain 4/10 (chronic per pt), SOB, RPD = 2     Resting HR 87 bpm     Resting BP 116/52      Resting Oxygen Saturation  97 %     Exercise Oxygen Saturation  during 6 min walk 99 %     Max Ex. HR 97 bpm     Max Ex. BP 124/60     2 Minute Post BP 110/68            Oxygen Initial Assessment:   Oxygen Re-Evaluation:   Oxygen Discharge (Final Oxygen  Re-Evaluation):   Initial Exercise Prescription:  Initial Exercise Prescription - 09/14/20 1500      Date of Initial Exercise RX and Referring Provider   Date 09/14/20    Referring Provider Dr. Rudean Haskell, MD    Expected Discharge Date 11/12/20      NuStep   Level 1    SPM 75    Minutes 15    METs 1.8      Track   Laps 8    Minutes 15    METs 1.93      Prescription Details   Frequency (times per week) 3    Duration Progress to 30 minutes of continuous aerobic without signs/symptoms of physical distress      Intensity   THRR 40-80% of Max Heartrate 66-133    Ratings of Perceived Exertion 11-13    Perceived Dyspnea 0-4      Progression   Progression Continue progressive overload as per policy without signs/symptoms or physical distress.      Resistance Training   Training Prescription Yes    Weight 2 lbs    Reps 10-15           Perform Capillary Blood Glucose checks as needed.  Exercise Prescription Changes:   Exercise Comments:   Exercise Goals and Review:  Exercise Goals    Row Name 09/14/20 1510             Exercise Goals   Increase Physical Activity Yes       Intervention Provide advice, education, support and counseling about physical activity/exercise needs.;Develop an individualized exercise prescription for aerobic and resistive training based on initial evaluation findings, risk stratification, comorbidities and participant's personal goals.       Expected Outcomes Short Term: Attend rehab on a regular basis to increase amount of physical activity.;Long Term: Add in home exercise to make exercise part of routine and to increase amount of physical activity.;Long Term:  Exercising regularly at least 3-5 days a week.       Increase Strength and Stamina Yes       Intervention Provide advice, education, support and counseling about physical activity/exercise needs.;Develop an individualized exercise prescription for aerobic and resistive training based on initial evaluation findings, risk stratification, comorbidities and participant's personal goals.       Expected Outcomes Short Term: Increase workloads from initial exercise prescription for resistance, speed, and METs.;Short Term: Perform resistance training exercises routinely during rehab and add in resistance training at home;Long Term: Improve cardiorespiratory fitness, muscular endurance and strength as measured by increased METs and functional capacity (6MWT)       Able to understand and use rate of perceived exertion (RPE) scale Yes       Intervention Provide education and explanation on how to use RPE scale       Expected Outcomes Short Term: Able to use RPE daily in rehab to express subjective intensity level;Long Term:  Able to use RPE to guide intensity level when exercising independently       Knowledge and understanding of Target Heart Rate Range (THRR) Yes       Intervention Provide education and explanation of THRR including how the numbers were predicted and where they are located for reference       Expected Outcomes Short Term: Able to state/look up THRR;Short Term: Able to use daily as guideline for intensity in rehab;Long Term: Able to use THRR to govern intensity when exercising independently       Understanding of  Exercise Prescription Yes       Intervention Provide education, explanation, and written materials on patient's individual exercise prescription       Expected Outcomes Short Term: Able to explain program exercise prescription;Long Term: Able to explain home exercise prescription to exercise independently              Exercise Goals Re-Evaluation :    Discharge Exercise  Prescription (Final Exercise Prescription Changes):   Nutrition:  Target Goals: Understanding of nutrition guidelines, daily intake of sodium 1500mg , cholesterol 200mg , calories 30% from fat and 7% or less from saturated fats, daily to have 5 or more servings of fruits and vegetables.  Biometrics:  Pre Biometrics - 09/14/20 1330      Pre Biometrics   Waist Circumference 38.5 inches    Hip Circumference 46.5 inches    Waist to Hip Ratio 0.83 %    Triceps Skinfold 45 mm    % Body Fat 48.3 %    Grip Strength 32 kg    Flexibility 11.25 in    Single Leg Stand 15.56 seconds            Nutrition Therapy Plan and Nutrition Goals:   Nutrition Assessments:  MEDIFICTS Score Key:  ?70 Need to make dietary changes   40-70 Heart Healthy Diet  ? 40 Therapeutic Level Cholesterol Diet   Picture Your Plate Scores:  [id number] Unhealthy dietary pattern with much room for improvement.  41-50 Dietary pattern unlikely to meet recommendations for good health and room for improvement.  51-60 More healthful dietary pattern, with some room for improvement.   >60 Healthy dietary pattern, although there may be some specific behaviors that could be improved.    Nutrition Goals Re-Evaluation:   Nutrition Goals Discharge (Final Nutrition Goals Re-Evaluation):   Psychosocial: Target Goals: Acknowledge presence or absence of significant depression and/or stress, maximize coping skills, provide positive support system. Participant is able to verbalize types and ability to use techniques and skills needed for reducing stress and depression.  Initial Review & Psychosocial Screening:  Initial Psych Review & Screening - 09/15/20 1020      Initial Review   Current issues with Current Stress Concerns;Current Anxiety/Panic    Source of Stress Concerns Family;Financial;Unable to perform yard/household activities    Comments Destri has history of anxiety and depression.  Pt receives couseling  virtually and medication at the [facility] in [place].  Mckenzye is currently out of her medication - Seroquel and plans to contact her provider today for renewal of her prescription. Hennie lives alone and financially this is stressful due to the demand of running a household particular since she has not returned back to work.      Family Dynamics   Concerns No support system    Comments Although she has family, they are not supporive of her needs.  Jimma feels by herself.      Barriers   Psychosocial barriers to participate in program The patient should benefit from training in stress management and relaxation.      Screening Interventions   Interventions Provide feedback about the scores to participant;To provide support and resources with identified psychosocial needs;Encouraged to exercise    Expected Outcomes Short Term goal: Utilizing psychosocial counselor, staff and physician to assist with identification of specific Stressors or current issues interfering with healing process. Setting desired goal for each stressor or current issue identified.;Long Term Goal: Stressors or current issues are controlled or eliminated.;Short Term goal: Identification and review with  participant of any Quality of Life or Depression concerns found by scoring the questionnaire.;Long Term goal: The participant improves quality of Life and PHQ9 Scores as seen by post scores and/or verbalization of changes           Quality of Life Scores:  Quality of Life - 09/14/20 1542      Quality of Life   Select Quality of Life      Quality of Life Scores   Health/Function Pre 13.2 %    Socioeconomic Pre 15.13 %    Psych/Spiritual Pre 19.71 %    Family Pre 9.6 %    GLOBAL Pre 14.43 %          Scores of 19 and below usually indicate a poorer quality of life in these areas.  A difference of  2-3 points is a clinically meaningful difference.  A difference of 2-3 points in the total score of the Quality of  Life Index has been associated with significant improvement in overall quality of life, self-image, physical symptoms, and general health in studies assessing change in quality of life.  PHQ-9: Recent Review Flowsheet Data    Depression screen [facility] 2/9 09/15/2020   Decreased Interest 2   Down, Depressed, Hopeless 3   PHQ - 2 Score 5   Altered sleeping 2   Tired, decreased energy 2   Change in appetite 0   Feeling bad or failure about yourself  1   Trouble concentrating 0   Moving slowly or fidgety/restless 0   Suicidal thoughts 0   PHQ-9 Score 10   Difficult doing work/chores Very difficult     Interpretation of Total Score  Total Score Depression Severity:  1-4 = Minimal depression, 5-9 = Mild depression, 10-14 = Moderate depression, 15-19 = Moderately severe depression, 20-27 = Severe depression   Psychosocial Evaluation and Intervention:   Psychosocial Re-Evaluation:   Psychosocial Discharge (Final Psychosocial Re-Evaluation):   Vocational Rehabilitation: Provide vocational rehab assistance to qualifying candidates.   Vocational Rehab Evaluation & Intervention:  Vocational Rehab - 09/15/20 1236      Initial Vocational Rehab Evaluation & Intervention   Assessment shows need for Vocational Rehabilitation No   Ailanny works part time and is on disability. Tyiesha does not need vocational rehab at this time.          Education: Education Goals: Education classes will be provided on a weekly basis, covering required topics. Participant will state understanding/return demonstration of topics presented.  Learning Barriers/Preferences:  Learning Barriers/Preferences - 09/14/20 1522      Learning Barriers/Preferences   Learning Barriers Sight   wears reading glasses   Learning Preferences Audio;Computer/Internet;Group Instruction;Individual Instruction;Pictoral;Skilled Demonstration;Verbal Instruction;Video;Written Material           Education Topics: Hypertension,  Hypertension Reduction -Define heart disease and high blood pressure. Discus how high blood pressure affects the body and ways to reduce high blood pressure.   Exercise and Your Heart -Discuss why it is important to exercise, the FITT principles of exercise, normal and abnormal responses to exercise, and how to exercise safely.   Angina -Discuss definition of angina, causes of angina, treatment of angina, and how to decrease risk of having angina.   Cardiac Medications -Review what the following cardiac medications are used for, how they affect the body, and side effects that may occur when taking the medications.  Medications include Aspirin, Beta blockers, calcium channel blockers, ACE Inhibitors, angiotensin receptor blockers, diuretics, digoxin, and antihyperlipidemics.   Congestive Heart Failure -  Discuss the definition of CHF, how to live with CHF, the signs and symptoms of CHF, and how keep track of weight and sodium intake.   Heart Disease and Intimacy -Discus the effect sexual activity has on the heart, how changes occur during intimacy as we age, and safety during sexual activity.   Smoking Cessation / COPD -Discuss different methods to quit smoking, the health benefits of quitting smoking, and the definition of COPD.   Nutrition I: Fats -Discuss the types of cholesterol, what cholesterol does to the heart, and how cholesterol levels can be controlled.   Nutrition II: Labels -Discuss the different components of food labels and how to read food label   Heart Parts/Heart Disease and PAD -Discuss the anatomy of the heart, the pathway of blood circulation through the heart, and these are affected by heart disease.   Stress I: Signs and Symptoms -Discuss the causes of stress, how stress may lead to anxiety and depression, and ways to limit stress.   Stress II: Relaxation -Discuss different types of relaxation techniques to limit stress.   Warning Signs of Stroke /  TIA -Discuss definition of a stroke, what the signs and symptoms are of a stroke, and how to identify when someone is having stroke.   Knowledge Questionnaire Score:  Knowledge Questionnaire Score - 09/14/20 1545      Knowledge Questionnaire Score   Pre Score 20/24           Core Components/Risk Factors/Patient Goals at Admission:  Personal Goals and Risk Factors at Admission - 09/14/20 1522      Core Components/Risk Factors/Patient Goals on Admission    Weight Management Yes;Obesity;Weight Loss    Intervention Weight Management: Develop a combined nutrition and exercise program designed to reach desired caloric intake, while maintaining appropriate intake of nutrient and fiber, sodium and fats, and appropriate energy expenditure required for the weight goal.;Weight Management: Provide education and appropriate resources to help participant work on and attain dietary goals.;Weight Management/Obesity: Establish reasonable short term and long term weight goals.;Obesity: Provide education and appropriate resources to help participant work on and attain dietary goals.    Admit Weight 183 lb 13.8 oz (83.4 kg)    Expected Outcomes Short Term: Continue to assess and modify interventions until short term weight is achieved;Long Term: Adherence to nutrition and physical activity/exercise program aimed toward attainment of established weight goal;Weight Maintenance: Understanding of the daily nutrition guidelines, which includes 25-35% calories from fat, 7% or less cal from saturated fats, less than 200mg  cholesterol, less than 1.5gm of sodium, & 5 or more servings of fruits and vegetables daily;Weight Loss: Understanding of general recommendations for a balanced deficit meal plan, which promotes 1-2 lb weight loss per week and includes a negative energy balance of [phone] kcal/d;Understanding recommendations for meals to include 15-35% energy as protein, 25-35% energy from fat, 35-60% energy from  carbohydrates, less than 200mg  of dietary cholesterol, 20-35 gm of total fiber daily;Understanding of distribution of calorie intake throughout the day with the consumption of 4-5 meals/snacks    Hypertension Yes    Intervention Provide education on lifestyle modifcations including regular physical activity/exercise, weight management, moderate sodium restriction and increased consumption of fresh fruit, vegetables, and low fat dairy, alcohol moderation, and smoking cessation.;Monitor prescription use compliance.    Expected Outcomes Short Term: Continued assessment and intervention until BP is < 140/60mm HG in hypertensive participants. < 130/38mm HG in hypertensive participants with diabetes, heart failure or chronic kidney disease.;Long Term: Maintenance of blood  pressure at goal levels.    Lipids Yes    Intervention Provide education and support for participant on nutrition & aerobic/resistive exercise along with prescribed medications to achieve LDL 70mg , HDL >40mg .    Expected Outcomes Short Term: Participant states understanding of desired cholesterol values and is compliant with medications prescribed. Participant is following exercise prescription and nutrition guidelines.;Long Term: Cholesterol controlled with medications as prescribed, with individualized exercise RX and with personalized nutrition plan. Value goals: LDL < 70mg , HDL > 40 mg.    Stress Yes    Intervention Offer individual and/or small group education and counseling on adjustment to heart disease, stress management and health-related lifestyle change. Teach and support self-help strategies.;Refer participants experiencing significant psychosocial distress to appropriate mental health specialists for further evaluation and treatment. When possible, include family members and significant others in education/counseling sessions.    Expected Outcomes Short Term: Participant demonstrates changes in health-related behavior, relaxation  and other stress management skills, ability to obtain effective social support, and compliance with psychotropic medications if prescribed.           Core Components/Risk Factors/Patient Goals Review:    Core Components/Risk Factors/Patient Goals at Discharge (Final Review):    ITP Comments:  ITP Comments    Row Name 09/15/20 1234           ITP Comments Dr Fransico Him MD, Medical Director              Comments: Symphoni attended orientation on 09/15/2020 to review rules and guidelines for program.  Completed 6 minute walk test, Intitial ITP, and exercise prescription.  VSS. Telemetry-Sinus Rhythm with intermittent bigeminy . See previous documentation  . Safety measures and social distancing in place per CDC guidelines.Jherica reported having chronic lower back and bilateral quad pain. Reilee also report having some shortness of breath rated a 2. Yatziri stopped to rest for 57 seconds during the walk test. Will review quality of life scores with the patient upon start of exercise next week. Barnet Pall, RN,BSN 09/15/2020 12:47 PM

## 2020-09-15 NOTE — Telephone Encounter (Signed)
Chandrasekhar, Mahesh A, MD  P Cv Div [facility] St Triage Can we increase ms A's metoprolol to 25 mg PO BID for Afl? Thanks,    Patient called and informed of the above. New updated Rx sent to pharmacy. Patient aware to call back with any new concerns.

## 2020-09-15 NOTE — Progress Notes (Signed)
We can increase her metoprolol to 25 mg PO BID.  She has been dealing with a lot.

## 2020-09-15 NOTE — Progress Notes (Signed)
Dimitri in today for Cardiac rehab orientation s/p 07/14/20 NSTEMI 11/4 DES RCA and OM1. Pt admits to being nervous, anxious and scared since her Cardiac event. Pt with past medical history of anxiety and takes Seroquel and receives counseling support.   Reassured pt regarding cardiac rehab and the benefit this will be to her to regain her self confidence.  Pt tearful during this exchange.  Pt placed on monitor for walk test. Ekg showed runs of bigeminy mostly noted during exertion or when pt is talking about her lack of support from her family lives alone and it is difficult to manage her home financially. Bigeminy resolves when she calms down.   Pt is asymptomatic and one cup of coffee today. Will alert her cardiologist - Dr. Izora Ribas as Lorain Childes. Alanson Aly, BSN Cardiac and Emergency planning/management officer

## 2020-09-17 DIAGNOSIS — I10 Essential (primary) hypertension: Secondary | ICD-10-CM | POA: Diagnosis not present

## 2020-09-20 ENCOUNTER — Telehealth: Payer: Self-pay

## 2020-09-20 ENCOUNTER — Encounter (HOSPITAL_COMMUNITY)
Admission: RE | Admit: 2020-09-20 | Discharge: 2020-09-20 | Disposition: A | Discharge status: Home / Self Care | Payer: Medicare Other | Source: Ambulatory Visit | Attending: Internal Medicine | Admitting: Internal Medicine

## 2020-09-20 ENCOUNTER — Other Ambulatory Visit: Payer: Self-pay

## 2020-09-20 DIAGNOSIS — I214 Non-ST elevation (NSTEMI) myocardial infarction: Secondary | ICD-10-CM | POA: Diagnosis not present

## 2020-09-20 DIAGNOSIS — Z955 Presence of coronary angioplasty implant and graft: Secondary | ICD-10-CM

## 2020-09-20 NOTE — Telephone Encounter (Signed)
-----   Message from Werner Lean, MD sent at 09/20/2020  4:23 PM EST ----- Regarding: FW: Telemtry rhythm at cardiac rehab Hi team,  We might want to bring her back for a closer follow up:  likely will slightly increase her beta blocker and place a heart monitor.  More importantly, she has had a lot of fear after her diagnosis of CAD and might need some extra TLC.  Thanks,  Dr. Gasper [name] ----- Message ----- From: Magda Kiel, RN Sent: 09/20/2020   4:12 PM EST To: Werner Lean, MD Subject: Telemtry rhythm at cardiac rehab               Good afternoon Dr Wendy [name] started cardiac rehab today she did well. I want to bring it to your attention that Wendy [name] reported that she called 911 due to feelings of increased palpitations.The rescue squad responded and did not take the patient to the ED. Today's Sinus Rhythm with an occasional PVC was noted which is an improvement. Wendy [name] did not experience any palpitations today. Her Vital signs were as follows  Resting heart rate 87 BP 104/54 Nustep  HR 86 BP 108/76 Track HR  93   BP 104/70 Exit BP 112/70 HR 83  Thanks for your input!  Sincerely, Barnet Pall RN Cardiac Rehab

## 2020-09-20 NOTE — Progress Notes (Addendum)
Daily Session Note  Patient Details  Name: Wendy [name] MRN: [id number] Date of Birth: 1966/05/09 Referring Provider:   Flowsheet Row CARDIAC REHAB PHASE II ORIENTATION from 09/14/2020 in [place]  Referring Provider Dr. Rudean Haskell, MD      Encounter Date: 09/20/2020  Check In:  Session Check In - 09/20/20 1331      Check-In   Supervising physician immediately available to respond to emergencies [facility] immediately available    Physician(s) Dr. Horris Latino    Location [facility]    Staff Present Lesly Rubenstein, MS, EP-C, CCRP;Olinty Celesta Aver, MS, ACSM CEP, Exercise Physiologist;Timon Geissinger, RN, BSN;Other   Esmeralda Links EP-C, Dallie Piles, RN   Virtual Visit No    Medication changes reported     No    Fall or balance concerns reported    No    Tobacco Cessation No Change    Current number of cigarettes/nicotine per day     0    Warm-up and Cool-down Performed on first and last piece of equipment    Resistance Training Performed Yes    VAD Patient? No    PAD/SET Patient? No      Pain Assessment   Currently in Pain? No/denies    Pain Score 0-No pain    Multiple Pain Sites No           Capillary Blood Glucose: No results found for this or any previous visit (from the past 24 hour(s)).   Exercise Prescription Changes - 09/20/20 1400      Response to Exercise   Blood Pressure (Admit) 100/54    Blood Pressure (Exercise) 108/70    Blood Pressure (Exit) 112/70    Heart Rate (Admit) 87 bpm    Heart Rate (Exercise) 93 bpm    Heart Rate (Exit) 83 bpm    Rating of Perceived Exertion (Exercise) 11    Symptoms None    Comments Pt's first day of exercise in the [facility] program    Duration Progress to 30 minutes of  aerobic without signs/symptoms of physical distress    Intensity THRR unchanged      Progression   Progression Continue to progress workloads to maintain intensity without signs/symptoms of  physical distress.    Average METs 1.8      Resistance Training   Training Prescription Yes    Weight 2 lbs    Reps 10-15    Time 10 Minutes      NuStep   Level 1    SPM 75    Minutes 20    METs 1.3      Track   Laps 8    Minutes 10    METs 2.39           Social History   Tobacco Use  Smoking Status Former Smoker  . Packs/day: 1.00  . Years: 20.00  . Pack years: 20.00  . Types: Cigarettes  . Quit date: 07/14/2020  . Years since quitting: 0.1  Smokeless Tobacco Never Used    Goals Met:  Exercise tolerated well No report of cardiac concerns or symptoms  Goals Unmet:  Not Applicable  Comments: Pt started cardiac rehab today.  Pt tolerated light exercise without difficulty. VSS, telemetry-Sinus Rhythm, asymptomatic.  Medication list reconciled. Pt denies barriers to medicaiton compliance.  PSYCHOSOCIAL ASSESSMENT:  PHQ-10. 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 Wendy [name] admits to having some personal stressors which  has added to current state.QUALITY OF LIFE SCORE REVIEW  Pt completed Quality of Life survey as a participant in Cardiac Rehab. Scores 21.0 or below are considered low. Pt score very low in several areas Overall 14.43, Health and Function 13.20 Wendy [name] says that she is switching from Dr Criss Rosales to the [employer] socioeconomic 15.13, physiological and spiritual 19.71, family 9.60. Patient quality of life slightly altered by physical constraints which limits ability to perform as prior to recent cardiac illness. Wendy [name] currently sees a psychiatrist. Will forward quality of life questionnaire after patient brings her therapist name.  Offered emotional support and reassurance.  Will continue to monitor and intervene as necessary.  Wendy [name] admits to having a lot of Anxiety." I am afraid to go to sleep at night and to walk outside." Emotional support provided. Wendy [name]'s goals are to have more confidence and not to be as scared. Wendy [name] reported  that she called 911 due to feelings of increased palpitations.The rescue squad responded and did not take the patient to the ED. Today's Sinus Rhythm with an occasional PVC was noted which is an improvement. Wendy [name] did not experience any palpitations today.. Will notify Dr Gasper Sells via epic Will continue to monitor the patient throughout  the program. . Barnet Pall, RN,BSN 09/20/2020 3:34 PM    Dr. Fransico Him is Medical Director for Cardiac Rehab at [facility].

## 2020-09-20 NOTE — Progress Notes (Signed)
Cardiac Individual Treatment Plan  Patient Details  Name: Wendy [name] MRN: [id number] Date of Birth: 1966/01/29 Referring Provider:   Flowsheet Row CARDIAC REHAB PHASE II ORIENTATION from 09/14/2020 in [place]  Referring Provider Dr. Rudean Haskell, MD      Initial Encounter Date:  [place] II ORIENTATION from 09/14/2020 in [place]  Date 09/14/20      Visit Diagnosis: NSTEMI (non-ST elevated myocardial infarction) [facility]) 07/14/20  07/15/20 S/P DES RCA, OM1  Patient's Home Medications on Admission:  Current Outpatient Medications:    acetaminophen (TYLENOL) 500 MG tablet, Take 500 mg by mouth every 6 (six) hours as needed for moderate pain., Disp: , Rfl:    albuterol (PROVENTIL HFA;VENTOLIN HFA) 108 (90 Base) MCG/ACT inhaler, Inhale 2 puffs into the lungs every 4 (four) hours as needed for wheezing or shortness of breath (cough)., Disp: 1 Inhaler, Rfl: 0   aspirin EC 81 MG EC tablet, Take 1 tablet (81 mg total) by mouth daily. Swallow whole., Disp: 30 tablet, Rfl: 11   beclomethasone (QVAR) 80 MCG/ACT inhaler, Inhale 1 puff into the lungs 2 (two) times daily as needed (sob/wheezing). , Disp: , Rfl:    celecoxib (CELEBREX) 200 MG capsule, Take 200 mg by mouth daily as needed for mild pain or moderate pain. , Disp: , Rfl:    clopidogrel (PLAVIX) 75 MG tablet, Take 1 tablet (75 mg total) by mouth daily., Disp: 90 tablet, Rfl: 3   EPINEPHrine (EPIPEN JR) 0.15 MG/0.3ML injection, Inject 0.15 mg into the muscle daily as needed for anaphylaxis. , Disp: , Rfl:    ergocalciferol (VITAMIN D2) 50000 UNITS capsule, Take 50,000 Units by mouth once a week. Sundays, Disp: , Rfl:    furosemide (LASIX) 20 MG tablet, TAKE 1/2 TABLET(10 MG) BY MOUTH DAILY AS NEEDED FOR FLUID RETENTION OR SWELLING, Disp: 45 tablet, Rfl: 3   gabapentin (NEURONTIN) 600 MG tablet, Take 600 mg by mouth at bedtime.,  Disp: , Rfl:    hydrOXYzine (ATARAX/VISTARIL) 25 MG tablet, Take 25 mg by mouth 3 (three) times daily as needed for anxiety or itching. , Disp: , Rfl:    metoprolol tartrate (LOPRESSOR) 25 MG tablet, Take 1 tablet (25 mg total) by mouth 2 (two) times daily., Disp: 30 tablet, Rfl: 6   nitroGLYCERIN (NITROSTAT) 0.4 MG SL tablet, Place 1 tablet (0.4 mg total) under the tongue every 5 (five) minutes x 3 doses as needed for chest pain., Disp: 25 tablet, Rfl: 12   polyethylene glycol (MIRALAX) 17 g packet, Take 17 g by mouth daily., Disp: 14 each, Rfl: 0   rosuvastatin (CRESTOR) 40 MG tablet, Take 1 tablet (40 mg total) by mouth daily., Disp: 30 tablet, Rfl: 6  Past Medical History: Past Medical History:  Diagnosis Date   Anxiety    Asthma    daily and prn inhalers   Cervical disc disease    decreased range of motion   Chronic pain    shoulders, neck, lower back   Coronary artery disease    Deformity, hand    left   Depression    Endometriosis    Lupron injection Q 3 mos.   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   Wears partial dentures    upper partial    Tobacco Use: Social History   Tobacco Use  Smoking Status Former Smoker   Packs/day: 1.00   Years: 20.00   Pack years: 20.00   Types: Cigarettes   Quit date: 07/14/2020   Years since quitting: 0.1  Smokeless Tobacco Never Used    Labs: Recent Review [facility] Pulmonary Rehab Latest Ref Rng & Units 04/13/2008 07/15/2020   Cholestrol 0 - 200 mg/dL - 248(H)   LDLCALC 0 - 99 mg/dL - 159(H)   HDL >40 mg/dL - 80   Trlycerides <150 mg/dL - 46   Hemoglobin A1c 4.8 - 5.6 % - 5.0   TCO2 - 19 -      Capillary Blood Glucose: No results found for:  GLUCAP   Exercise Target Goals: Exercise Program Goal: Individual exercise prescription set using results from initial 6 min walk test and THRR while considering  patients activity barriers and safety.   Exercise Prescription Goal: Starting with aerobic activity 30 plus minutes a day, 3 days per week for initial exercise prescription. Provide home exercise prescription and guidelines that participant acknowledges understanding prior to discharge.  Activity Barriers & Risk Stratification:  Activity Barriers & Cardiac Risk Stratification - 09/14/20 1510      Activity Barriers & Cardiac Risk Stratification   Activity Barriers Arthritis;Back Problems;Neck/Spine Problems;Joint Problems;Deconditioning;Shortness of Breath    Cardiac Risk Stratification High           6 Minute Walk:  6 Minute Walk    Row Name 09/14/20 1506         6 Minute Walk   Distance 800 feet     Walk Time 6 minutes     # of Rest Breaks 1  5:03 - 6:00 (57 seconds)     MPH 1.51     METS 2.21     RPE 13     Perceived Dyspnea  2     VO2 Peak 7.73     Symptoms Yes (comment)     Comments Low back pain/bilateral quadriceps pain 4/10 (chronic per pt), SOB, RPD = 2     Resting HR 87 bpm     Resting BP 116/52     Resting Oxygen Saturation  97 %     Exercise Oxygen Saturation  during 6 min walk 99 %     Max Ex. HR 97 bpm     Max Ex. BP 124/60     2 Minute Post BP 110/68            Oxygen Initial Assessment:   Oxygen Re-Evaluation:   Oxygen Discharge (Final Oxygen Re-Evaluation):   Initial Exercise Prescription:  Initial Exercise Prescription - 09/14/20 1500      Date of Initial Exercise RX and Referring Provider   Date 09/14/20    Referring Provider Dr. Rudean Haskell, MD    Expected Discharge Date 11/12/20      NuStep   Level 1    SPM 75    Minutes 15    METs 1.8      Track   Laps 8    Minutes 15    METs 1.93      Prescription Details   Frequency (times per week) 3     Duration Progress to 30 minutes of continuous aerobic without signs/symptoms of physical distress      Intensity   THRR 40-80%  of Max Heartrate [phone]    Ratings of Perceived Exertion 11-13    Perceived Dyspnea 0-4      Progression   Progression Continue progressive overload as per policy without signs/symptoms or physical distress.      Resistance Training   Training Prescription Yes    Weight 2 lbs    Reps 10-15           Perform Capillary Blood Glucose checks as needed.  Exercise Prescription Changes:   Exercise Prescription Changes    Row Name 09/20/20 1400             Response to Exercise   Blood Pressure (Admit) 100/54       Blood Pressure (Exercise) 108/70       Blood Pressure (Exit) 112/70       Heart Rate (Admit) 87 bpm       Heart Rate (Exercise) 93 bpm       Heart Rate (Exit) 83 bpm       Rating of Perceived Exertion (Exercise) 11       Symptoms None       Comments Pt's first day of exercise in the CRP2 program       Duration Progress to 30 minutes of  aerobic without signs/symptoms of physical distress       Intensity THRR unchanged               Progression   Progression Continue to progress workloads to maintain intensity without signs/symptoms of physical distress.       Average METs 1.8               Resistance Training   Training Prescription Yes       Weight 2 lbs       Reps 10-15       Time 10 Minutes               NuStep   Level 1       SPM 75       Minutes 20       METs 1.3               Track   Laps 8       Minutes 10       METs 2.39              Exercise Comments:   Exercise Comments    Row Name 09/20/20 1445           Exercise Comments Pt's first day of exercise in the CRP2 prpogram. Pt tolerated session well with no compalints.              Exercise Goals and Review:   Exercise Goals    Row Name 09/14/20 1510             Exercise Goals   Increase Physical Activity Yes       Intervention Provide  advice, education, support and counseling about physical activity/exercise needs.;Develop an individualized exercise prescription for aerobic and resistive training based on initial evaluation findings, risk stratification, comorbidities and participant's personal goals.       Expected Outcomes Short Term: Attend rehab on a regular basis to increase amount of physical activity.;Long Term: Add in home exercise to make exercise part of routine and to increase amount of physical activity.;Long Term: Exercising regularly at least 3-5 days a week.       Increase Strength and Stamina Yes  Intervention Provide advice, education, support and counseling about physical activity/exercise needs.;Develop an individualized exercise prescription for aerobic and resistive training based on initial evaluation findings, risk stratification, comorbidities and participant's personal goals.       Expected Outcomes Short Term: Increase workloads from initial exercise prescription for resistance, speed, and METs.;Short Term: Perform resistance training exercises routinely during rehab and add in resistance training at home;Long Term: Improve cardiorespiratory fitness, muscular endurance and strength as measured by increased METs and functional capacity (6MWT)       Able to understand and use rate of perceived exertion (RPE) scale Yes       Intervention Provide education and explanation on how to use RPE scale       Expected Outcomes Short Term: Able to use RPE daily in rehab to express subjective intensity level;Long Term:  Able to use RPE to guide intensity level when exercising independently       Knowledge and understanding of Target Heart Rate Range (THRR) Yes       Intervention Provide education and explanation of THRR including how the numbers were predicted and where they are located for reference       Expected Outcomes Short Term: Able to state/look up THRR;Short Term: Able to use daily as guideline for intensity  in rehab;Long Term: Able to use THRR to govern intensity when exercising independently       Understanding of Exercise Prescription Yes       Intervention Provide education, explanation, and written materials on patient's individual exercise prescription       Expected Outcomes Short Term: Able to explain program exercise prescription;Long Term: Able to explain home exercise prescription to exercise independently              Exercise Goals Re-Evaluation :  Exercise Goals Re-Evaluation    [place] Name 09/20/20 1443             Exercise Goal Re-Evaluation   Exercise Goals Review Increase Physical Activity;Increase Strength and Stamina;Able to understand and use rate of perceived exertion (RPE) scale;Knowledge and understanding of Target Heart Rate Range (THRR);Understanding of Exercise Prescription       Comments Pt's first day of exercise in the CRP2 program. Pt understands RPE scale, THRR, and Exercise Rx.       Expected Outcomes Will continue to monitor patient and progress exercise workloads as tolerated.               Discharge Exercise Prescription (Final Exercise Prescription Changes):  Exercise Prescription Changes - 09/20/20 1400      Response to Exercise   Blood Pressure (Admit) 100/54    Blood Pressure (Exercise) 108/70    Blood Pressure (Exit) 112/70    Heart Rate (Admit) 87 bpm    Heart Rate (Exercise) 93 bpm    Heart Rate (Exit) 83 bpm    Rating of Perceived Exertion (Exercise) 11    Symptoms None    Comments Pt's first day of exercise in the CRP2 program    Duration Progress to 30 minutes of  aerobic without signs/symptoms of physical distress    Intensity THRR unchanged      Progression   Progression Continue to progress workloads to maintain intensity without signs/symptoms of physical distress.    Average METs 1.8      Resistance Training   Training Prescription Yes    Weight 2 lbs    Reps 10-15    Time 10 Minutes      NuStep  Level 1    SPM 75     Minutes 20    METs 1.3      Track   Laps 8    Minutes 10    METs 2.39           Nutrition:  Target Goals: Understanding of nutrition guidelines, daily intake of sodium 1500mg , cholesterol 200mg , calories 30% from fat and 7% or less from saturated fats, daily to have 5 or more servings of fruits and vegetables.  Biometrics:  Pre Biometrics - 09/14/20 1330      Pre Biometrics   Waist Circumference 38.5 inches    Hip Circumference 46.5 inches    Waist to Hip Ratio 0.83 %    Triceps Skinfold 45 mm    % Body Fat 48.3 %    Grip Strength 32 kg    Flexibility 11.25 in    Single Leg Stand 15.56 seconds            Nutrition Therapy Plan and Nutrition Goals:  Nutrition Therapy & Goals - 09/20/20 1415      Nutrition Therapy   RD appointment deferred Yes           Nutrition Assessments:  MEDIFICTS Score Key:  ?70 Need to make dietary changes   40-70 Heart Healthy Diet  ? 40 Therapeutic Level Cholesterol Diet   Picture Your Plate Scores:  [id number] Unhealthy dietary pattern with much room for improvement.  41-50 Dietary pattern unlikely to meet recommendations for good health and room for improvement.  51-60 More healthful dietary pattern, with some room for improvement.   >60 Healthy dietary pattern, although there may be some specific behaviors that could be improved.    Nutrition Goals Re-Evaluation:   Nutrition Goals Discharge (Final Nutrition Goals Re-Evaluation):   Psychosocial: Target Goals: Acknowledge presence or absence of significant depression and/or stress, maximize coping skills, provide positive support system. Participant is able to verbalize types and ability to use techniques and skills needed for reducing stress and depression.  Initial Review & Psychosocial Screening:  Initial Psych Review & Screening - 09/15/20 1020      Initial Review   Current issues with Current Stress Concerns;Current Anxiety/Panic    Source of Stress Concerns  Family;Financial;Unable to perform yard/household activities    Comments Wendy [name] has history of anxiety and depression.  Pt receives couseling virtually and medication at the [facility] in [place].  Samir is currently out of her medication - Seroquel and plans to contact her provider today for renewal of her prescription. Wendy [name] lives alone and financially this is stressful due to the demand of running a household particular since she has not returned back to work.      Family Dynamics   Concerns No support system    Comments Although she has family, they are not supporive of her needs.  Wendy [name] feels by herself.      Barriers   Psychosocial barriers to participate in program The patient should benefit from training in stress management and relaxation.      Screening Interventions   Interventions Provide feedback about the scores to participant;To provide support and resources with identified psychosocial needs;Encouraged to exercise    Expected Outcomes Short Term goal: Utilizing psychosocial counselor, staff and physician to assist with identification of specific Stressors or current issues interfering with healing process. Setting desired goal for each stressor or current issue identified.;Long Term Goal: Stressors or current issues are controlled or eliminated.;Short Term goal: Identification and review with  participant of any Quality of Life or Depression concerns found by scoring the questionnaire.;Long Term goal: The participant improves quality of Life and PHQ9 Scores as seen by post scores and/or verbalization of changes           Quality of Life Scores:  Quality of Life - 09/14/20 1542      Quality of Life   Select Quality of Life      Quality of Life Scores   Health/Function Pre 13.2 %    Socioeconomic Pre 15.13 %    Psych/Spiritual Pre 19.71 %    Family Pre 9.6 %    GLOBAL Pre 14.43 %          Scores of 19 and below usually indicate a poorer quality of life in  these areas.  A difference of  2-3 points is a clinically meaningful difference.  A difference of 2-3 points in the total score of the Quality of Life Index has been associated with significant improvement in overall quality of life, self-image, physical symptoms, and general health in studies assessing change in quality of life.  PHQ-9: Recent Review Flowsheet Data    Depression screen [facility] 2/9 09/15/2020   Decreased Interest 2   Down, Depressed, Hopeless 3   PHQ - 2 Score 5   Altered sleeping 2   Tired, decreased energy 2   Change in appetite 0   Feeling bad or failure about yourself  1   Trouble concentrating 0   Moving slowly or fidgety/restless 0   Suicidal thoughts 0   PHQ-9 Score 10   Difficult doing work/chores Very difficult     Interpretation of Total Score  Total Score Depression Severity:  1-4 = Minimal depression, 5-9 = Mild depression, 10-14 = Moderate depression, 15-19 = Moderately severe depression, 20-27 = Severe depression   Psychosocial Evaluation and Intervention:   Psychosocial Re-Evaluation:  Psychosocial Re-Evaluation    [place] Name 09/20/20 1613             Psychosocial Re-Evaluation   Current issues with Current Stress Concerns;Current Anxiety/Panic;Current Depression       Comments Reviewwed quality of life questionnaire today withb the patient.       Expected Outcomes Wendy [name] will have decreased anxiety and depression upon completion of phase 2 cardiacc rehab.       Interventions Encouraged to attend Cardiac Rehabilitation for the exercise;Relaxation education;Stress management education       Comments Wendy [name] has history of anxiety and depression.  Pt receives couseling virtually and medication at the [facility] in [place].  Almedina is currently out of her medication - Seroquel and plans to contact her provider today for renewal of her prescription. Weena lives alone and financially this is stressful due to the demand of running a household  particular since she has not returned back to work.               Initial Review   Source of Stress Concerns Family;Financial;Unable to perform yard/household activities              Psychosocial Discharge (Final Psychosocial Re-Evaluation):  Psychosocial Re-Evaluation - 09/20/20 1613      Psychosocial Re-Evaluation   Current issues with Current Stress Concerns;Current Anxiety/Panic;Current Depression    Comments Reviewwed quality of life questionnaire today withb the patient.    Expected Outcomes Wendy [name] will have decreased anxiety and depression upon completion of phase 2 cardiacc rehab.    Interventions Encouraged to attend Cardiac Rehabilitation for the exercise;Relaxation  education;Stress management education    Comments Wendy [name] has history of anxiety and depression.  Pt receives couseling virtually and medication at the [facility] in [place].  Wendy [name] is currently out of her medication - Seroquel and plans to contact her provider today for renewal of her prescription. Wendy [name] lives alone and financially this is stressful due to the demand of running a household particular since she has not returned back to work.      Initial Review   Source of Stress Concerns Family;Financial;Unable to perform yard/household activities           Vocational Rehabilitation: Provide vocational rehab assistance to qualifying candidates.   Vocational Rehab Evaluation & Intervention:  Vocational Rehab - 09/15/20 1236      Initial Vocational Rehab Evaluation & Intervention   Assessment shows need for Vocational Rehabilitation No   Wendy [name] works part time and is on disability. Wendy [name] does not need vocational rehab at this time.          Education: Education Goals: Education classes will be provided on a weekly basis, covering required topics. Participant will state understanding/return demonstration of topics presented.  Learning Barriers/Preferences:  Learning Barriers/Preferences -  09/14/20 1522      Learning Barriers/Preferences   Learning Barriers Sight   wears reading glasses   Learning Preferences Audio;Computer/Internet;Group Instruction;Individual Instruction;Pictoral;Skilled Demonstration;Verbal Instruction;Video;Written Material           Education Topics: Hypertension, Hypertension Reduction -Define heart disease and high blood pressure. Discus how high blood pressure affects the body and ways to reduce high blood pressure.   Exercise and Your Heart -Discuss why it is important to exercise, the FITT principles of exercise, normal and abnormal responses to exercise, and how to exercise safely.   Angina -Discuss definition of angina, causes of angina, treatment of angina, and how to decrease risk of having angina.   Cardiac Medications -Review what the following cardiac medications are used for, how they affect the body, and side effects that may occur when taking the medications.  Medications include Aspirin, Beta blockers, calcium channel blockers, ACE Inhibitors, angiotensin receptor blockers, diuretics, digoxin, and antihyperlipidemics.   Congestive Heart Failure -Discuss the definition of CHF, how to live with CHF, the signs and symptoms of CHF, and how keep track of weight and sodium intake.   Heart Disease and Intimacy -Discus the effect sexual activity has on the heart, how changes occur during intimacy as we age, and safety during sexual activity.   Smoking Cessation / COPD -Discuss different methods to quit smoking, the health benefits of quitting smoking, and the definition of COPD.   Nutrition I: Fats -Discuss the types of cholesterol, what cholesterol does to the heart, and how cholesterol levels can be controlled.   Nutrition II: Labels -Discuss the different components of food labels and how to read food label   Heart Parts/Heart Disease and PAD -Discuss the anatomy of the heart, the pathway of blood circulation through the  heart, and these are affected by heart disease.   Stress I: Signs and Symptoms -Discuss the causes of stress, how stress may lead to anxiety and depression, and ways to limit stress.   Stress II: Relaxation -Discuss different types of relaxation techniques to limit stress.   Warning Signs of Stroke / TIA -Discuss definition of a stroke, what the signs and symptoms are of a stroke, and how to identify when someone is having stroke.   Knowledge Questionnaire Score:  Knowledge Questionnaire Score - 09/14/20 1545  Knowledge Questionnaire Score   Pre Score 20/24           Core Components/Risk Factors/Patient Goals at Admission:  Personal Goals and Risk Factors at Admission - 09/14/20 1522      Core Components/Risk Factors/Patient Goals on Admission    Weight Management Yes;Obesity;Weight Loss    Intervention Weight Management: Develop a combined nutrition and exercise program designed to reach desired caloric intake, while maintaining appropriate intake of nutrient and fiber, sodium and fats, and appropriate energy expenditure required for the weight goal.;Weight Management: Provide education and appropriate resources to help participant work on and attain dietary goals.;Weight Management/Obesity: Establish reasonable short term and long term weight goals.;Obesity: Provide education and appropriate resources to help participant work on and attain dietary goals.    Admit Weight 183 lb 13.8 oz (83.4 kg)    Expected Outcomes Short Term: Continue to assess and modify interventions until short term weight is achieved;Long Term: Adherence to nutrition and physical activity/exercise program aimed toward attainment of established weight goal;Weight Maintenance: Understanding of the daily nutrition guidelines, which includes 25-35% calories from fat, 7% or less cal from saturated fats, less than 200mg  cholesterol, less than 1.5gm of sodium, & 5 or more servings of fruits and vegetables  daily;Weight Loss: Understanding of general recommendations for a balanced deficit meal plan, which promotes 1-2 lb weight loss per week and includes a negative energy balance of [phone] kcal/d;Understanding recommendations for meals to include 15-35% energy as protein, 25-35% energy from fat, 35-60% energy from carbohydrates, less than 200mg  of dietary cholesterol, 20-35 gm of total fiber daily;Understanding of distribution of calorie intake throughout the day with the consumption of 4-5 meals/snacks    Hypertension Yes    Intervention Provide education on lifestyle modifcations including regular physical activity/exercise, weight management, moderate sodium restriction and increased consumption of fresh fruit, vegetables, and low fat dairy, alcohol moderation, and smoking cessation.;Monitor prescription use compliance.    Expected Outcomes Short Term: Continued assessment and intervention until BP is < 140/27mm HG in hypertensive participants. < 130/35mm HG in hypertensive participants with diabetes, heart failure or chronic kidney disease.;Long Term: Maintenance of blood pressure at goal levels.    Lipids Yes    Intervention Provide education and support for participant on nutrition & aerobic/resistive exercise along with prescribed medications to achieve LDL 70mg , HDL >40mg .    Expected Outcomes Short Term: Participant states understanding of desired cholesterol values and is compliant with medications prescribed. Participant is following exercise prescription and nutrition guidelines.;Long Term: Cholesterol controlled with medications as prescribed, with individualized exercise RX and with personalized nutrition plan. Value goals: LDL < 70mg , HDL > 40 mg.    Stress Yes    Intervention Offer individual and/or small group education and counseling on adjustment to heart disease, stress management and health-related lifestyle change. Teach and support self-help strategies.;Refer participants experiencing  significant psychosocial distress to appropriate mental health specialists for further evaluation and treatment. When possible, include family members and significant others in education/counseling sessions.    Expected Outcomes Short Term: Participant demonstrates changes in health-related behavior, relaxation and other stress management skills, ability to obtain effective social support, and compliance with psychotropic medications if prescribed.           Core Components/Risk Factors/Patient Goals Review:   Goals and Risk Factor Review    Row Name 09/20/20 1412 09/20/20 1617           Core Components/Risk Factors/Patient Goals Review   Personal Goals Review Weight Management/Obesity;Lipids;Hypertension;Stress Weight  Management/Obesity;Lipids;Hypertension;Stress;Tobacco Cessation      Review Wendy [name] started exercise on 09/20/20. Decreased ectopy noted. Quality of life questionnaire reviewed Wendy [name] started exercise on 09/20/20. Decreased ectopy noted. Quality of life questionnaire reviewed. Vital signs were stable. patient continues to abstain from smoking. Will offer emotinal support for anxiety depression as needed      Expected Outcomes -- Shadae will continue to participte in phase 2 cardiac rehab for exercise, nutrtion and lifestyle modifications             Core Components/Risk Factors/Patient Goals at Discharge (Final Review):   Goals and Risk Factor Review - 09/20/20 1617      Core Components/Risk Factors/Patient Goals Review   Personal Goals Review Weight Management/Obesity;Lipids;Hypertension;Stress;Tobacco Cessation    Review Wendy [name] started exercise on 09/20/20. Decreased ectopy noted. Quality of life questionnaire reviewed. Vital signs were stable. patient continues to abstain from smoking. Will offer emotinal support for anxiety depression as needed    Expected Outcomes Wendy [name] will continue to participte in phase 2 cardiac rehab for exercise, nutrtion and lifestyle  modifications           ITP Comments:  ITP Comments    Row Name 09/15/20 1234 09/20/20 1408         ITP Comments Dr Fransico Him MD, Medical Director 30 Day ITP REview. Patient started exercise at cardiac rehab on 09/20/20 Tolerated well             Comments: See ITP comments.Barnet Pall, RN,BSN 09/21/2020 3:37 PM

## 2020-09-20 NOTE — Telephone Encounter (Signed)
Spoke with the pt and made her an appt for 09/21/20. She was happy to have the appt and be able to talk with Dr. Gasper Sells.

## 2020-09-21 ENCOUNTER — Other Ambulatory Visit: Payer: Self-pay | Admitting: Internal Medicine

## 2020-09-21 ENCOUNTER — Encounter: Payer: Self-pay | Admitting: Internal Medicine

## 2020-09-21 ENCOUNTER — Encounter: Payer: Self-pay | Admitting: Radiology

## 2020-09-21 ENCOUNTER — Ambulatory Visit: Payer: Medicare Other | Admitting: Internal Medicine

## 2020-09-21 ENCOUNTER — Ambulatory Visit (INDEPENDENT_AMBULATORY_CARE_PROVIDER_SITE_OTHER): Payer: Medicare Other

## 2020-09-21 VITALS — BP 110/70 | HR 61 | Ht 59.0 in | Wt 185.2 lb

## 2020-09-21 DIAGNOSIS — R002 Palpitations: Secondary | ICD-10-CM | POA: Diagnosis not present

## 2020-09-21 DIAGNOSIS — I493 Ventricular premature depolarization: Secondary | ICD-10-CM

## 2020-09-21 DIAGNOSIS — E785 Hyperlipidemia, unspecified: Secondary | ICD-10-CM | POA: Diagnosis not present

## 2020-09-21 LAB — LIPID PANEL
Chol/HDL Ratio: 2.3 ratio (ref 0.0–4.4)
Cholesterol, Total: 152 mg/dL (ref 100–199)
HDL: 67 mg/dL (ref >39)
LDL Chol Calc (NIH): 74 mg/dL (ref 0–99)
Triglycerides: 49 mg/dL (ref 0–149)
VLDL Cholesterol Cal: 11 mg/dL (ref 5–40)

## 2020-09-21 LAB — HEPATIC FUNCTION PANEL
ALT: 9 IU/L (ref 0–32)
AST: 15 IU/L (ref 0–40)
Albumin: 4.1 g/dL (ref 3.8–4.9)
Alkaline Phosphatase: 63 IU/L (ref 44–121)
Bilirubin Total: 0.4 mg/dL (ref 0.0–1.2)
Bilirubin, Direct: 0.14 mg/dL (ref 0.00–0.40)
Total Protein: 7.3 g/dL (ref 6.0–8.5)

## 2020-09-21 MED ORDER — CLOPIDOGREL BISULFATE 75 MG PO TABS: 75.0000 mg | ORAL_TABLET | Freq: Every day | ORAL | 3 refills | Status: DC

## 2020-09-21 MED FILL — CLOPIDOGREL 75 MG TABLET: 75 | 90 days supply | Qty: 90 | Fill #0

## 2020-09-21 NOTE — Patient Instructions (Addendum)
Medication Instructions:  1) STOP BRILINTA (ticagrelor)  2) START PLAVIX 75 mg daily, but take 300 mg your first dose ONLY  *If you need a refill on your cardiac medications before your next appointment, please call your pharmacy*  Lab Work: TODAY! Lipids, LFTs If you have labs (blood work) drawn today and your tests are completely normal, you will receive your results only by: Marland Kitchen MyChart Message (if you have MyChart) OR . A paper copy in the mail If you have any lab test that is abnormal or we need to change your treatment, we will call you to review the results.  Testing: ZIO XT- Long Term Monitor Instructions   Your physician has requested you wear your ZIO patch monitor 14 days.   This is a single patch monitor.  Irhythm supplies one patch monitor per enrollment.  Additional stickers are not available.   Please do not apply patch if you will be having a Nuclear Stress Test, Echocardiogram, Cardiac CT, MRI, or Chest Xray during the time frame you would be wearing the monitor. The patch cannot be worn during these tests.  You cannot remove and re-apply the ZIO XT patch monitor.   Your ZIO patch monitor will be sent USPS Priority mail from [facility] directly to your home address. The monitor may also be mailed to a PO BOX if home delivery is not available.   It may take 3-5 days to receive your monitor after you have been enrolled.   Once you have received you monitor, please review enclosed instructions.  Your monitor has already been registered assigning a specific monitor serial # to you.   Applying the monitor   Shave hair from upper left chest.   Hold abrader disc by orange tab.  Rub abrader in 40 strokes over left upper chest as indicated in your monitor instructions.   Clean area with 4 enclosed alcohol pads .  Use all pads to assure are is cleaned thoroughly.  Let dry.   Apply patch as indicated in monitor instructions.  Patch will be place under collarbone on left  side of chest with arrow pointing upward.   Rub patch adhesive wings for 2 minutes.Remove white label marked "1".  Remove white label marked "2".  Rub patch adhesive wings for 2 additional minutes.   While looking in a mirror, press and release button in center of patch.  A small green light will flash 3-4 times .  This will be your only indicator the monitor has been turned on.     Do not shower for the first 24 hours.  You may shower after the first 24 hours.   Press button if you feel a symptom. You will hear a small click.  Record Date, Time and Symptom in the Patient Log Book.   When you are ready to remove patch, follow instructions on last 2 pages of Patient Log Book.  Stick patch monitor onto last page of Patient Log Book.   Place Patient Log Book in [place] box.  Use locking tab on box and tape box closed securely.  The [employer] and [employer] has [employer] on it.  Please place in mailbox as soon as possible.  Your physician should have your test results approximately 7 days after the monitor has been mailed back to [facility].   Call [place] at [phone] if you have questions regarding your ZIO XT patch monitor.  Call them immediately if you see an orange light blinking  on your monitor.   If your monitor falls off in less than 4 days contact our Monitor department at [phone].  If your monitor becomes loose or falls off after 4 days call [employer] at [phone] for suggestions on securing your monitor.    Follow-Up: Your provider recommends that you schedule a follow-up appointment in 4 months with Dr. Gasper Sells. Your February appointment has been canceled.

## 2020-09-21 NOTE — Progress Notes (Signed)
Enrolled patient for a 14 day Zio XT Monitor to be mailed to patients home  

## 2020-09-21 NOTE — Progress Notes (Signed)
Cardiology Office Note:    Date:  09/21/2020   ID:  Wendy [name], DOB Jul 24, 1966, MRN [id number]  PCP:  Wardell Honour, MD  [facility] Cardiologist:  Werner Lean, MD  [place] Electrophysiologist:  None   Referring MD: Lucianne Lei, MD   Follow up:  Palpitations post   History of Present Illness:    Wendy [name] is a 55 y.o. female with a hx of CAD (NSTEMI 07/16/20 with mRCA DES, 50% pRCA; OM1 PCI, D1 50%) with preserved EF, HTN, HLD, Former Tobacco Use, GERD, who presented 07/15/20 for NSTEMI.   Patient notes that she is doing OK.  Since last visit notes had PVCs during cardiac rehab. Relevant interval testing or therapy include starting new beta blocker for PVCs which helped her burden.  Patient had palpitations to the point that she called EMS; with reassuring findings.   No chest pain or pressure; no nitroglycerin needed.  No SOB/DOE and no PND/Orthopnea.  No weight gain or leg swelling.  No palpitations or syncope.  Does notes easy bruising on the medication.  Notes nose and teeth soreness.  Had dropped red meat and is down to brown rice and grilled white meats.    Past Medical History:  Diagnosis Date  . Anxiety   . Asthma    daily and prn inhalers  . Cervical disc disease    decreased range of motion  . Chronic pain    shoulders, neck, lower back  . Coronary artery disease   . Deformity, hand    left  . Depression   . Endometriosis    Lupron injection Q 3 mos.  . Fluid retention in legs   . Hyperlipidemia   . Hypertension   . Injury, brachial plexus    left  . Multiple allergies    takes allergy shots  . PONV (postoperative nausea and vomiting)   . 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 Wears partial dentures    upper partial    Past Surgical History:  Procedure Laterality Date  . ANKLE RECONSTRUCTION  02/01/2012   Procedure: RECONSTRUCTION ANKLE;  Surgeon: Wylene Simmer, MD;  Location: [facility];  Service: Orthopedics;  Laterality: Right;  Excision of right calcaneonavicular coalition with autologus fat graft interposition  . BRACHIAL PLEXUS EXPLORATION    . CARDIAC CATHETERIZATION    . CORONARY STENT INTERVENTION N/A 07/15/2020   Procedure: CORONARY STENT INTERVENTION;  Surgeon: Jettie Booze, MD;  Location: [facility];  Service: Cardiovascular;  Laterality: N/A;  . DIAGNOSTIC LAPAROSCOPY  10/02/2008   peritoneal bx.  Marland Kitchen LEFT HEART CATH AND CORONARY ANGIOGRAPHY N/A 07/15/2020   Procedure: LEFT HEART CATH AND CORONARY ANGIOGRAPHY;  Surgeon: Jettie Booze, MD;  Location: [facility];  Service: Cardiovascular;  Laterality: N/A;  . MULTIPLE TOOTH EXTRACTIONS     upper teeth and wisdom teeth  . OVARIAN CYST REMOVAL  2000  . RIB RESECTION     left - cervical rib removal  . SHOULDER ARTHROSCOPY W/ ROTATOR CUFF REPAIR  07/2011   right  . SHOULDER SURGERY     left  . TOOTH EXTRACTION     x 1   Current Medications: Current Meds  Medication Sig  . acetaminophen (TYLENOL) 500 MG tablet Take 500 mg by mouth every 6 (six) hours as needed for moderate pain.  Marland Kitchen albuterol (PROVENTIL HFA;VENTOLIN HFA) 108 (90 Base) MCG/ACT inhaler Inhale 2 puffs into the lungs  every 4 (four) hours as needed for wheezing or shortness of breath (cough).  Marland Kitchen aspirin EC 81 MG EC tablet Take 1 tablet (81 mg total) by mouth daily. Swallow whole.  . beclomethasone (QVAR) 80 MCG/ACT inhaler Inhale 1 puff into the lungs 2 (two) times daily as needed (sob/wheezing).   . celecoxib (CELEBREX) 200 MG capsule Take 200 mg by mouth daily as needed for mild pain or moderate pain.   Marland Kitchen clopidogrel (PLAVIX) 75 MG tablet Take 1 tablet (75 mg total) by mouth daily.  Marland Kitchen EPINEPHrine (EPIPEN JR) 0.15 MG/0.3ML injection Inject 0.15 mg into the muscle daily as needed for anaphylaxis.   Marland Kitchen ergocalciferol (VITAMIN D2) 50000 UNITS capsule Take 50,000 Units by mouth once a week. Sundays  . furosemide (LASIX) 20  MG tablet TAKE 1/2 TABLET(10 MG) BY MOUTH DAILY AS NEEDED FOR FLUID RETENTION OR SWELLING  . gabapentin (NEURONTIN) 600 MG tablet Take 600 mg by mouth at bedtime.  . hydrOXYzine (ATARAX/VISTARIL) 25 MG tablet Take 25 mg by mouth 3 (three) times daily as needed for anxiety or itching.   . metoprolol tartrate (LOPRESSOR) 25 MG tablet Take 1 tablet (25 mg total) by mouth 2 (two) times daily.  . nitroGLYCERIN (NITROSTAT) 0.4 MG SL tablet Place 1 tablet (0.4 mg total) under the tongue every 5 (five) minutes x 3 doses as needed for chest pain.  . polyethylene glycol (MIRALAX) 17 g packet Take 17 g by mouth daily.  . rosuvastatin (CRESTOR) 40 MG tablet Take 1 tablet (40 mg total) by mouth daily.  . [DISCONTINUED] ticagrelor (BRILINTA) 90 MG TABS tablet Take 1 tablet (90 mg total) by mouth 2 (two) times daily.     Allergies:   Pfizer-biontech covid-19 vacc [covid-19 mrna vacc (moderna)], Chocolate, Ivp dye [iodinated diagnostic agents], Latex, Shellfish allergy, Fish-derived products, Peanut-containing drug products, and Soap   Social History   Socioeconomic History  . Marital status: Single    Spouse name: Not on file  . Number of children: Not on file  . Years of education: 30  . Highest education level: Not on file  Occupational History  . Not on file  Tobacco Use  . Smoking status: Former Smoker    Packs/day: 1.00    Years: 20.00    Pack years: 20.00    Types: Cigarettes    Quit date: 07/14/2020    Years since quitting: 0.1  . Smokeless tobacco: Never Used  Vaping Use  . Vaping Use: Never used  Substance and Sexual Activity  . Alcohol use: No    Alcohol/week: 0.0 standard drinks    Comment: occasional  . Drug use: No  . Sexual activity: Yes    Partners: Male    Birth control/protection: None  Other Topics Concern  . Not on file  Social History Narrative  . Not on file   Social Determinants of Health   Financial Resource Strain: Not on file  Food Insecurity: Not on file   Transportation Needs: Not on file  Physical Activity: Not on file  Stress: Not on file  Social Connections: Not on file    Family History: The patient's family history includes Cancer in her mother; Dementia in her father; Stroke in her father.  ROS:   Please see the history of present illness.     All other systems reviewed and are negative.  EKGs/Labs/Other Studies Reviewed:    The following studies were reviewed today:  EKG:   09/21/20: Sinus rhythm rate 61 with QTc 440  07/19/2020: Sinus Rhythm 84 QTc 508  Recent Labs: 12/12/2019: ALT 11 07/15/2020: B Natriuretic Peptide 135.9; TSH 0.618 07/16/2020: BUN 9; Creatinine, Ser 0.88; Hemoglobin 12.4; Platelets 243; Potassium 3.7; Sodium 139  Recent Lipid Panel    Component Value Date/Time   CHOL 248 (H) 07/15/2020 1730   TRIG 46 07/15/2020 1730   HDL 80 07/15/2020 1730   CHOLHDL 3.1 07/15/2020 1730   VLDL 9 07/15/2020 1730   LDLCALC 159 (H) 07/15/2020 1730   Transthoracic Echocardiogram: Date: 09/1120 Results: Mild to moderate MR; possible posterior restriction IMPRESSIONS  1. Left ventricular ejection fraction, by estimation, is 55 to 60%. The  left ventricle has normal function. The left ventricle has no regional  wall motion abnormalities. Left ventricular diastolic parameters are  indeterminate.  2. Right ventricular systolic function is normal. The right ventricular  size is normal. There is normal pulmonary artery systolic pressure.  3. MR is eccentric, directed posterior into LA. The mitral valve is  normal in structure. Mild to moderate mitral valve regurgitation.  4. The aortic valve is normal in structure. Aortic valve regurgitation is  mild to moderate.   Left/Right Heart Catheterizations: Date:07/16/20 Results:  Prox RCA lesion is 50% stenosed. Vasospasm noted on this lesion which improved with IC NTG.  Mid RCA lesion is 99% stenosed. This was the culprit lesion which was ulcerated.  A drug-eluting  stent was successfully placed using a STENT RESOLUTE ONYX 3.0X15, postdilated to 3.25 mm.  Post intervention, there is a 0% residual stenosis.  1st Mrg lesion is 80% stenosed.  A drug-eluting stent was successfully placed using a STENT RESOLUTE ONYX 2.5X18, postdilated to 3.0 mm.  Post intervention, there is a 0% residual stenosis.  Prox Cx lesion is 25% stenosed.  1st Diag lesion is 50% stenosed.  Prox LAD lesion is 25% stenosed.  The left ventricular systolic function is normal.  LV end diastolic pressure is moderately elevated.  The left ventricular ejection fraction is 55-65% by visual estimate.  There is no aortic valve stenosis.  Physical Exam:    VS:  BP 110/70   Pulse 61   Wt 185 lb 3.2 oz (84 kg)   LMP 07/12/2016 (Approximate) Comment: 2-3 day cycle  SpO2 97%   BMI 37.72 kg/m     Wt Readings from Last 3 Encounters:  09/21/20 185 lb 3.2 oz (84 kg)  09/14/20 183 lb 13.8 oz (83.4 kg)  07/30/20 185 lb (83.9 kg)    GEN: Well nourished, well developed in no acute distress HEENT: Normal NECK: No JVD; No carotid bruits LYMPHATICS: No lymphadenopathy CARDIAC: RRR, II/Vi holosystolic murmur, rubs, gallops RESPIRATORY:  Clear to auscultation without rales, wheezing or rhonchi  ABDOMEN: Soft, non-tender, non-distended MUSCULOSKELETAL:  No edema; No deformity  SKIN: Warm and dry NEUROLOGIC:  Alert and oriented x 3 PSYCHIATRIC:  Normal affect   ASSESSMENT:    1. PVC's (premature ventricular contractions)   2. Palpitations   3. Hyperlipidemia, unspecified hyperlipidemia type    PLAN:    In order of problems listed above:  Coronary Artery Disease; Obstructive HLD - asymptomatic - anatomy: 50% pRCA, mRCA PCI, OM1 PCI, pLCX 25%, D1 50%, pLAD 25% - continue ASA 81 mg; will switch to Plavix 75 mg PO daily with load one day prior - continue statin, goal LDL < 70; will recheck lipids LFTs - continue BB - continue nitrates PRN - Patient in cardiac rehab  PVCs  with palpitations - will obtain 14-day non live heart monitor (ZioPatch) -  continue beta blocker   Mitral Vale Regurgitation - mild to moderate regurgitation - stable on PRN lasix - will repeat echo at stent-anniversary  Prolonged QTc - Improved from prior  Essential Hypertension- ambulatory blood pressure, will start ambulatory BP monitoring; gave education on how to perform ambulatory blood pressure monitoring including the frequency and technique; goal ambulatory blood pressure < 135/85 on average - continue home medications   4 month follow up unless new symptoms or abnormal test results warranting change in plan  Medication Adjustments/Labs and Tests Ordered: Current medicines are reviewed at length with the patient today.  Concerns regarding medicines are outlined above.  Orders Placed This Encounter  Procedures  . Hepatic function panel  . Lipid panel  . LONG TERM MONITOR (3-14 DAYS)  . EKG 12-Lead   Meds ordered this encounter  Medications  . clopidogrel (PLAVIX) 75 MG tablet    Sig: Take 1 tablet (75 mg total) by mouth daily.    Dispense:  90 tablet    Refill:  3    Patient Instructions  Medication Instructions:  1) STOP BRILINTA (ticagrelor)  2) START PLAVIX 75 mg daily, but take 300 mg your first dose ONLY  *If you need a refill on your cardiac medications before your next appointment, please call your pharmacy*  Lab Work: TODAY! Lipids, LFTs If you have labs (blood work) drawn today and your tests are completely normal, you will receive your results only by: Marland Kitchen MyChart Message (if you have MyChart) OR . A paper copy in the mail If you have any lab test that is abnormal or we need to change your treatment, we will call you to review the results.  Testing: ZIO XT- Long Term Monitor Instructions   Your physician has requested you wear your ZIO patch monitor 14 days.   This is a single patch monitor.  [employer] supplies one patch monitor per enrollment.   Additional stickers are not available.   Please do not apply patch if you will be having a Nuclear Stress Test, Echocardiogram, Cardiac CT, MRI, or Chest Xray during the time frame you would be wearing the monitor. The patch cannot be worn during these tests.  You cannot remove and re-apply the ZIO XT patch monitor.   Your ZIO patch monitor will be sent USPS Priority mail from [facility] directly to your home address. The monitor may also be mailed to a PO BOX if home delivery is not available.   It may take 3-5 days to receive your monitor after you have been enrolled.   Once you have received you monitor, please review enclosed instructions.  Your monitor has already been registered assigning a specific monitor serial # to you.   Applying the monitor   Shave hair from upper left chest.   Hold abrader disc by orange tab.  Rub abrader in 40 strokes over left upper chest as indicated in your monitor instructions.   Clean area with 4 enclosed alcohol pads .  Use all pads to assure are is cleaned thoroughly.  Let dry.   Apply patch as indicated in monitor instructions.  Patch will be place under collarbone on left side of chest with arrow pointing upward.   Rub patch adhesive wings for 2 minutes.Remove white label marked "1".  Remove white label marked "2".  Rub patch adhesive wings for 2 additional minutes.   While looking in a mirror, press and release button in center of patch.  A small green light will flash  3-4 times .  This will be your only indicator the monitor has been turned on.     Do not shower for the first 24 hours.  You may shower after the first 24 hours.   Press button if you feel a symptom. You will hear a small click.  Record Date, Time and Symptom in the Patient Log Book.   When you are ready to remove patch, follow instructions on last 2 pages of Patient Log Book.  Stick patch monitor onto last page of Patient Log Book.   Place Patient Log Book in [place] box.   Use locking tab on box and tape box closed securely.  The [employer] has [employer] on it.  Please place in mailbox as soon as possible.  Your physician should have your test results approximately 7 days after the monitor has been mailed back to [facility].   Call [place] at [phone] if you have questions regarding your ZIO XT patch monitor.  Call them immediately if you see an orange light blinking on your monitor.   If your monitor falls off in less than 4 days contact our Monitor department at [phone].  If your monitor becomes loose or falls off after 4 days call [employer] at [phone] for suggestions on securing your monitor.    Follow-Up: Your provider recommends that you schedule a follow-up appointment in 4 months with Dr. Gasper Sells. Your February appointment has been canceled.     Signed, Werner Lean, MD  09/21/2020 9:24 AM    [place]

## 2020-09-22 ENCOUNTER — Encounter (HOSPITAL_COMMUNITY)
Admission: RE | Admit: 2020-09-22 | Discharge: 2020-09-22 | Disposition: A | Discharge status: Home / Self Care | Payer: Medicare Other | Source: Ambulatory Visit | Attending: Internal Medicine | Admitting: Internal Medicine

## 2020-09-22 ENCOUNTER — Other Ambulatory Visit: Payer: Self-pay

## 2020-09-22 ENCOUNTER — Telehealth: Payer: Self-pay | Admitting: Internal Medicine

## 2020-09-22 DIAGNOSIS — Z955 Presence of coronary angioplasty implant and graft: Secondary | ICD-10-CM | POA: Diagnosis not present

## 2020-09-22 DIAGNOSIS — I214 Non-ST elevation (NSTEMI) myocardial infarction: Secondary | ICD-10-CM | POA: Diagnosis not present

## 2020-09-22 NOTE — Telephone Encounter (Signed)
Werner Lean, MD  09/21/2020 5:02 PM EST      Results: Improvement in cholesterol Plan: Continue current medications  Werner Lean, MD   The patient has been notified of the result and verbalized understanding.  All questions (if any) were answered. Darrell Jewel, RN 09/22/2020 11:20 AM

## 2020-09-22 NOTE — Telephone Encounter (Signed)
Patient returning Jennifer's call for results from [facility] done yesterday (1/11.) Please call/advise. Thank you!

## 2020-09-23 DIAGNOSIS — E559 Vitamin D deficiency, unspecified: Secondary | ICD-10-CM | POA: Diagnosis not present

## 2020-09-23 DIAGNOSIS — G540 Brachial plexus disorders: Secondary | ICD-10-CM | POA: Diagnosis not present

## 2020-09-23 DIAGNOSIS — I252 Old myocardial infarction: Secondary | ICD-10-CM | POA: Diagnosis not present

## 2020-09-23 DIAGNOSIS — Z7185 Encounter for immunization safety counseling: Secondary | ICD-10-CM | POA: Diagnosis not present

## 2020-09-23 DIAGNOSIS — Z87891 Personal history of nicotine dependence: Secondary | ICD-10-CM | POA: Diagnosis not present

## 2020-09-23 DIAGNOSIS — E785 Hyperlipidemia, unspecified: Secondary | ICD-10-CM | POA: Diagnosis not present

## 2020-09-23 DIAGNOSIS — I10 Essential (primary) hypertension: Secondary | ICD-10-CM | POA: Diagnosis not present

## 2020-09-24 ENCOUNTER — Encounter (HOSPITAL_COMMUNITY): Payer: Medicare Other

## 2020-09-24 DIAGNOSIS — I493 Ventricular premature depolarization: Secondary | ICD-10-CM

## 2020-09-24 DIAGNOSIS — R002 Palpitations: Secondary | ICD-10-CM

## 2020-09-24 NOTE — Telephone Encounter (Signed)
Pt called and stated that she would not be able to make her cardiac rehab session on 09/24/2020. I advised her cardiac rehab nurse that she would be absent.

## 2020-09-27 ENCOUNTER — Other Ambulatory Visit: Payer: Self-pay

## 2020-09-27 ENCOUNTER — Encounter (HOSPITAL_COMMUNITY): Payer: Medicare Other

## 2020-09-27 ENCOUNTER — Other Ambulatory Visit: Payer: Self-pay | Admitting: Internal Medicine

## 2020-09-27 MED ORDER — METOPROLOL TARTRATE 25 MG PO TABS: 25.0000 mg | ORAL_TABLET | Freq: Two times a day (BID) | ORAL | 3 refills | Status: DC

## 2020-09-27 MED FILL — METOPROLOL TARTRATE 25 MG T: 25 | 15 days supply | Qty: 30 | Fill #0

## 2020-09-29 ENCOUNTER — Other Ambulatory Visit: Payer: Self-pay

## 2020-09-29 ENCOUNTER — Encounter (HOSPITAL_COMMUNITY)
Admission: RE | Admit: 2020-09-29 | Discharge: 2020-09-29 | Disposition: A | Discharge status: Home / Self Care | Payer: Medicare Other | Source: Ambulatory Visit | Attending: Internal Medicine | Admitting: Internal Medicine

## 2020-09-29 VITALS — Wt 185.0 lb

## 2020-09-29 DIAGNOSIS — I214 Non-ST elevation (NSTEMI) myocardial infarction: Secondary | ICD-10-CM

## 2020-09-29 DIAGNOSIS — Z955 Presence of coronary angioplasty implant and graft: Secondary | ICD-10-CM

## 2020-09-29 NOTE — Progress Notes (Signed)
Wendy [name] 55 y.o. female Nutrition Note  Visit Diagnosis: NSTEMI (non-ST elevated myocardial infarction) ([place]) 07/14/20  07/15/20 S/P DES RCA, OM1  Past Medical History:  Diagnosis Date  . Anxiety   . Asthma    daily and prn inhalers  . Cervical disc disease    decreased range of motion  . Chronic pain    shoulders, neck, lower back  . Coronary artery disease   . Deformity, hand    left  . Depression   . Endometriosis    Lupron injection Q 3 mos.  . Fluid retention in legs   . Hyperlipidemia   . Hypertension   . Injury, brachial plexus    left  . Multiple allergies    takes allergy shots  . PONV (postoperative nausea and vomiting)   . 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 Wears partial dentures    upper partial     Medications reviewed.   Current Outpatient Medications:  .  acetaminophen (TYLENOL) 500 MG tablet, Take 500 mg by mouth every 6 (six) hours as needed for moderate pain., Disp: , Rfl:  .  albuterol (PROVENTIL HFA;VENTOLIN HFA) 108 (90 Base) MCG/ACT inhaler, Inhale 2 puffs into the lungs every 4 (four) hours as needed for wheezing or shortness of breath (cough)., Disp: 1 Inhaler, Rfl: 0 .  aspirin EC 81 MG EC tablet, Take 1 tablet (81 mg total) by mouth daily. Swallow whole., Disp: 30 tablet, Rfl: 11 .  beclomethasone (QVAR) 80 MCG/ACT inhaler, Inhale 1 puff into the lungs 2 (two) times daily as needed (sob/wheezing). , Disp: , Rfl:  .  celecoxib (CELEBREX) 200 MG capsule, Take 200 mg by mouth daily as needed for mild pain or moderate pain. , Disp: , Rfl:  .  clopidogrel (PLAVIX) 75 MG tablet, Take 1 tablet (75 mg total) by mouth daily., Disp: 90 tablet, Rfl: 3 .  EPINEPHrine (EPIPEN JR) 0.15 MG/0.3ML injection, Inject 0.15 mg into the muscle daily as needed for anaphylaxis. , Disp: , Rfl:  .  ergocalciferol (VITAMIN D2) 50000 UNITS capsule, Take 50,000 Units by mouth once a week. Sundays, Disp: , Rfl:  .  furosemide (LASIX) 20  MG tablet, TAKE 1/2 TABLET(10 MG) BY MOUTH DAILY AS NEEDED FOR FLUID RETENTION OR SWELLING, Disp: 45 tablet, Rfl: 3 .  gabapentin (NEURONTIN) 600 MG tablet, Take 600 mg by mouth at bedtime., Disp: , Rfl:  .  hydrOXYzine (ATARAX/VISTARIL) 25 MG tablet, Take 25 mg by mouth 3 (three) times daily as needed for anxiety or itching. , Disp: , Rfl:  .  metoprolol tartrate (LOPRESSOR) 25 MG tablet, Take 1 tablet (25 mg total) by mouth 2 (two) times daily., Disp: 180 tablet, Rfl: 3 .  nitroGLYCERIN (NITROSTAT) 0.4 MG SL tablet, Place 1 tablet (0.4 mg total) under the tongue every 5 (five) minutes x 3 doses as needed for chest pain., Disp: 25 tablet, Rfl: 12 .  polyethylene glycol (MIRALAX) 17 g packet, Take 17 g by mouth daily., Disp: 14 each, Rfl: 0 .  rosuvastatin (CRESTOR) 40 MG tablet, Take 1 tablet (40 mg total) by mouth daily., Disp: 30 tablet, Rfl: 6   Ht Readings from Last 1 Encounters:  09/21/20 4\' 11"  (1.499 m)     Wt Readings from Last 3 Encounters:  09/21/20 185 lb 3.2 oz (84 kg)  09/14/20 183 lb 13.8 oz (83.4 kg)  07/30/20 185 lb (83.9 kg)     There is no height or weight on  file to calculate BMI.   Social History   Tobacco Use  Smoking Status Former Smoker  . Packs/day: 1.00  . Years: 20.00  . Pack years: 20.00  . Types: Cigarettes  . Quit date: 07/14/2020  . Years since quitting: 0.2  Smokeless Tobacco Never Used     Lab Results  Component Value Date   CHOL 152 09/21/2020   Lab Results  Component Value Date   HDL 67 09/21/2020   Lab Results  Component Value Date   LDLCALC 74 09/21/2020   Lab Results  Component Value Date   TRIG 49 09/21/2020     Lab Results  Component Value Date   HGBA1C 5.0 07/15/2020     CBG (last 3)  No results for input(s): GLUCAP in the last 72 hours.   Nutrition Note  Spoke with pt. Nutrition Plan and Nutrition Survey goals reviewed with pt. Pt is following a Heart Healthy diet. Pt wants to lose wt. Pt has been trying to lose wt  by using Myfitnesspal app. She lost 10 lbs quickly after STEMI in Novemeber 2021.  She reports eating <1300 kcals per day.  Often dinner is a vegetable with no protein or fat or starch.  She has quit smoking, almost eliminated sodas, quit eating fast food/eating out on 2 times per week, and started exercising. She has a good understanding of a heart healthy diet. She used to meal plan and cook balanced meals for her father and feels she has the skills to do this for herself.  Per discussion, pt does not use canned/convenience foods often. Pt does not add salt to food. Pt does not eat out frequently.   She plans to bring in her phone at next visit to reset her myfitnesspal nutrition goals.  Pt expressed understanding of the information reviewed.   Nutrition Diagnosis ? Obese  II = 35-39.9 related to excessive energy intake and lack of physical activity and exercise as evidenced by BMI 37.37kg/m2  Nutrition Intervention ? Pt's individual nutrition plan reviewed with pt. ? Benefits of adopting Heart Healthy diet discussed when Medficts reviewed. ? Continue client-centered nutrition education by RD, as part of interdisciplinary care.  Goal(s) ? Pt to identify food quantities necessary to achieve weight loss of 6-24 lb at graduation from cardiac rehab.  ? Pt to build a healthy plate including vegetables, fruits, whole grains, and low-fat dairy products in a heart healthy meal plan.  Plan:   Will provide client-centered nutrition education as part of interdisciplinary care  Monitor and evaluate progress toward nutrition goal with team.   Michaele Offer, MS, RDN, LDN

## 2020-09-30 DIAGNOSIS — J80 Acute respiratory distress syndrome: Secondary | ICD-10-CM | POA: Diagnosis not present

## 2020-09-30 DIAGNOSIS — I959 Hypotension, unspecified: Secondary | ICD-10-CM | POA: Diagnosis not present

## 2020-09-30 DIAGNOSIS — R0789 Other chest pain: Secondary | ICD-10-CM | POA: Diagnosis not present

## 2020-09-30 DIAGNOSIS — I491 Atrial premature depolarization: Secondary | ICD-10-CM | POA: Diagnosis not present

## 2020-09-30 DIAGNOSIS — R079 Chest pain, unspecified: Secondary | ICD-10-CM | POA: Diagnosis not present

## 2020-09-30 NOTE — Progress Notes (Signed)
Faxed quality of life questionnaire to [facility]/ Alema Mengista NP.Barnet Pall, RN,BSN 09/30/2020 8:23 AM

## 2020-10-01 ENCOUNTER — Emergency Department (HOSPITAL_COMMUNITY)
Admission: EM | Admit: 2020-10-01 | Discharge: 2020-10-01 | Disposition: A | Discharge status: Home / Self Care | Payer: Medicare Other | Attending: Emergency Medicine | Admitting: Emergency Medicine

## 2020-10-01 ENCOUNTER — Encounter (HOSPITAL_COMMUNITY): Payer: Medicare Other

## 2020-10-01 ENCOUNTER — Other Ambulatory Visit: Payer: Self-pay

## 2020-10-01 DIAGNOSIS — R0789 Other chest pain: Secondary | ICD-10-CM | POA: Diagnosis not present

## 2020-10-01 DIAGNOSIS — Z91010 Allergy to peanuts: Secondary | ICD-10-CM | POA: Insufficient documentation

## 2020-10-01 DIAGNOSIS — I10 Essential (primary) hypertension: Secondary | ICD-10-CM | POA: Diagnosis not present

## 2020-10-01 DIAGNOSIS — Z79899 Other long term (current) drug therapy: Secondary | ICD-10-CM | POA: Insufficient documentation

## 2020-10-01 DIAGNOSIS — I2510 Atherosclerotic heart disease of native coronary artery without angina pectoris: Secondary | ICD-10-CM | POA: Diagnosis not present

## 2020-10-01 DIAGNOSIS — Z7982 Long term (current) use of aspirin: Secondary | ICD-10-CM | POA: Insufficient documentation

## 2020-10-01 DIAGNOSIS — Z91040 Latex allergy status: Secondary | ICD-10-CM | POA: Diagnosis not present

## 2020-10-01 DIAGNOSIS — J45909 Unspecified asthma, uncomplicated: Secondary | ICD-10-CM | POA: Diagnosis not present

## 2020-10-01 DIAGNOSIS — Z87891 Personal history of nicotine dependence: Secondary | ICD-10-CM | POA: Insufficient documentation

## 2020-10-01 DIAGNOSIS — R079 Chest pain, unspecified: Secondary | ICD-10-CM

## 2020-10-01 LAB — BASIC METABOLIC PANEL
Anion gap: 10 (ref 5–15)
BUN: 8 mg/dL (ref 6–20)
CO2: 25 mmol/L (ref 22–32)
Calcium: 9.4 mg/dL (ref 8.9–10.3)
Chloride: 104 mmol/L (ref 98–111)
Creatinine, Ser: 0.82 mg/dL (ref 0.44–1.00)
GFR, Estimated: >60 mL/min (ref >60)
Glucose, Bld: 96 mg/dL (ref 70–99)
Potassium: 3.9 mmol/L (ref 3.5–5.1)
Sodium: 139 mmol/L (ref 135–145)

## 2020-10-01 LAB — TROPONIN I (HIGH SENSITIVITY)
Troponin I (High Sensitivity): 9 ng/L (ref <18)
Troponin I (High Sensitivity): 9 ng/L (ref <18)

## 2020-10-01 LAB — CBC
HCT: 39.1 % (ref 36.0–46.0)
Hemoglobin: 11.9 g/dL — ABNORMAL LOW (ref 12.0–15.0)
MCH: 27.7 pg (ref 26.0–34.0)
MCHC: 30.4 g/dL (ref 30.0–36.0)
MCV: 91.1 fL (ref 80.0–100.0)
Platelets: 239 10*3/uL (ref 150–400)
RBC: 4.29 MIL/uL (ref 3.87–5.11)
RDW: 12.7 % (ref 11.5–15.5)
WBC: 7.3 10*3/uL (ref 4.0–10.5)
nRBC: 0 % (ref 0.0–0.2)

## 2020-10-01 MED ORDER — ACETAMINOPHEN 325 MG PO TABS: 650.0000 mg | ORAL_TABLET | Freq: Four times a day (QID) | ORAL | 0 refills | Status: AC | PRN

## 2020-10-01 MED ORDER — CYCLOBENZAPRINE HCL 10 MG PO TABS: 10.0000 mg | ORAL_TABLET | Freq: Two times a day (BID) | ORAL | 0 refills | Status: DC | PRN

## 2020-10-01 NOTE — ED Notes (Signed)
Pt endorsing recurring CP from earlier this evening, took one dose of at-home SL NTG. Rpt EKG taken

## 2020-10-01 NOTE — ED Notes (Addendum)
After taking pt IV out  pt told me she would like stay.

## 2020-10-01 NOTE — ED Triage Notes (Signed)
Pt here from home via [facility] for eval of R sided chest pain, no radiation, A&O4. Pt being monitored by cardiologist for PVCs. Hx htn & stent, MI, compliant w all home medications  22 R hand VSS

## 2020-10-01 NOTE — ED Provider Notes (Signed)
[facility] EMERGENCY DEPARTMENT Provider Note   CSN: [id number] Arrival date & time: 10/01/20  0003     History Chief Complaint  Patient presents with   Chest Pain    Wendy [name] is a 55 y.o. female.  The history is provided by the patient.  Chest Pain Pain location:  R chest Pain quality: aching and sharp   Pain radiates to:  Does not radiate Pain severity:  Mild Timing:  Constant Chronicity:  New Context: not breathing   Relieved by:  None tried Worsened by:  Nothing Ineffective treatments:  None tried Associated symptoms: no abdominal pain, no dizziness, no dysphagia, no numbness and no orthopnea        Past Medical History:  Diagnosis Date   Anxiety    Asthma    daily and prn inhalers   Cervical disc disease    decreased range of motion   Chronic pain    shoulders, neck, lower back   Coronary artery disease    Deformity, hand    left   Depression    Endometriosis    Lupron injection Q 3 mos.   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   Wears partial dentures    upper partial    Patient Active Problem List   Diagnosis Date Noted   Coronary artery disease involving native coronary artery of native heart without angina pectoris 07/30/2020   Essential hypertension 07/30/2020   Hyperlipidemia 07/30/2020   Gastroesophageal reflux disease without esophagitis 07/30/2020   Nonrheumatic mitral valve regurgitation 07/30/2020   Prolonged Q-T interval on ECG 07/30/2020   NSTEMI (non-ST elevated myocardial infarction) ([place]) 07/15/2020   BV (bacterial vaginosis) 03/06/2013   ANXIETY 11/05/2007   DEPRESSION 11/05/2007   NAUSEA, CHRONIC 11/05/2007   FIBROIDS, UTERUS 03/26/2007   MIGRAINE, COMMON  03/26/2007   OVARIAN CYST 03/26/2007   DEGENERATIVE DISC DISEASE, CERVICAL SPINE 03/26/2007   DEGENERATIVE DISC DISEASE, LUMBOSACRAL SPINE 03/26/2007   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    Past Surgical History:  Procedure Laterality Date   ANKLE RECONSTRUCTION  02/01/2012   Procedure: RECONSTRUCTION ANKLE;  Surgeon: Wylene Simmer, MD;  Location: [place];  Service: Orthopedics;  Laterality: Right;  Excision of right calcaneonavicular coalition with autologus fat graft interposition   BRACHIAL PLEXUS EXPLORATION     CARDIAC CATHETERIZATION     CORONARY STENT INTERVENTION N/A 07/15/2020   Procedure: CORONARY STENT INTERVENTION;  Surgeon: Jettie Booze, MD;  Location: [place] [facility];  Service: Cardiovascular;  Laterality: N/A;   DIAGNOSTIC LAPAROSCOPY  10/02/2008   peritoneal bx.   LEFT HEART CATH AND CORONARY ANGIOGRAPHY N/A 07/15/2020   Procedure: LEFT HEART CATH AND CORONARY ANGIOGRAPHY;  Surgeon: Jettie Booze, MD;  Location: [place] [facility];  Service: Cardiovascular;  Laterality: N/A;   MULTIPLE TOOTH EXTRACTIONS     upper teeth and wisdom teeth   OVARIAN CYST REMOVAL  2000   RIB RESECTION     left - cervical rib removal   SHOULDER ARTHROSCOPY W/ ROTATOR CUFF REPAIR  07/2011   right   SHOULDER SURGERY     left   TOOTH EXTRACTION     x 1     OB History    Gravida  0   Para  0   Term  0   Preterm  0   AB  0   Living  0     SAB  0   IAB  0   Ectopic  0   Multiple  0   Live Births              Family History  Problem Relation Age of Onset   Cancer Mother    Dementia Father    Stroke Father     Social History   Tobacco Use   Smoking status: Former Smoker    Packs/day: 1.00    Years: 20.00    Pack years: 20.00    Types: Cigarettes    Quit date: 07/14/2020    Years since quitting: 0.2   Smokeless tobacco: Never Used  Vaping Use   Vaping Use: Never used  Substance Use Topics   Alcohol use: No     Alcohol/week: 0.0 standard drinks    Comment: occasional   Drug use: No    Home Medications Prior to Admission medications   Medication Sig Start Date End Date Taking? Authorizing Provider  acetaminophen (TYLENOL) 325 MG tablet Take 2 tablets (650 mg total) by mouth every 6 (six) hours as needed. 10/01/20  Yes Bettylee Feig, Corene Cornea, MD  albuterol (PROVENTIL HFA;VENTOLIN HFA) 108 (90 Base) MCG/ACT inhaler Inhale 2 puffs into the lungs every 4 (four) hours as needed for wheezing or shortness of breath (cough). 11/20/16  Yes Street, [place], PA-C  aspirin EC 81 MG EC tablet Take 1 tablet (81 mg total) by mouth daily. Swallow whole. 07/17/20  Yes Bhagat, Bhavinkumar, PA  beclomethasone (QVAR) 80 MCG/ACT inhaler Inhale 1 puff into the lungs 2 (two) times daily as needed (sob/wheezing).    Yes [provider]  celecoxib (CELEBREX) 200 MG capsule Take 200 mg by mouth daily. 09/10/19  Yes [provider]  clopidogrel (PLAVIX) 75 MG tablet Take 1 tablet (75 mg total) by mouth daily. 09/21/20  Yes Chandrasekhar, Mahesh A, MD  cyclobenzaprine (FLEXERIL) 10 MG tablet Take 1 tablet (10 mg total) by mouth 2 (two) times daily as needed for muscle spasms. 10/01/20  Yes Santana Gosdin, Corene Cornea, MD  EPINEPHrine (EPIPEN JR) 0.15 MG/0.3ML injection Inject 0.15 mg into the muscle daily as needed for anaphylaxis.    Yes [provider]  ergocalciferol (VITAMIN D2) 50000 UNITS capsule Take 50,000 Units by mouth every Sunday. Sundays   Yes [provider]  furosemide (LASIX) 20 MG tablet TAKE 1/2 TABLET(10 MG) BY MOUTH DAILY AS NEEDED FOR FLUID RETENTION OR SWELLING Patient taking differently: Take 10 mg by mouth daily as needed (fluid retention or swelling). 08/25/20  Yes Chandrasekhar, Mahesh A, MD  gabapentin (NEURONTIN) 600 MG tablet Take 600 mg by mouth at bedtime.   Yes [provider]  hydrOXYzine (ATARAX/VISTARIL) 25 MG tablet Take 25 mg by mouth 3 (three) times daily as needed for  anxiety or itching.  10/09/19  Yes [provider]  metoprolol tartrate (LOPRESSOR) 25 MG tablet Take 1 tablet (25 mg total) by mouth 2 (two) times daily. 09/27/20  Yes Chandrasekhar, Mahesh A, MD  nitroGLYCERIN (NITROSTAT) 0.4 MG SL tablet Place 1 tablet (0.4 mg total) under the tongue every 5 (five) minutes x 3 doses as needed for chest pain. 07/16/20  Yes Bhagat, Bhavinkumar, PA  Oxcarbazepine (TRILEPTAL) 300 MG tablet Take 300 mg by mouth 2 (two) times daily. 09/23/20  Yes [provider]  polyethylene glycol ([place])  17 g packet Take 17 g by mouth daily. Patient taking differently: Take 17 g by mouth daily as needed for mild constipation. 07/24/20  Yes Scot Jun, FNP  potassium chloride SA (KLOR-CON) 20 MEQ tablet Take 20 mEq by mouth daily.   Yes [provider]  rosuvastatin (CRESTOR) 40 MG tablet Take 1 tablet (40 mg total) by mouth daily. 07/16/20  Yes Bhagat, [place], PA    Allergies    Pfizer-biontech covid-19 vacc [covid-19 mrna vacc (moderna)], Chocolate, Ivp dye [iodinated diagnostic agents], Latex, Shellfish allergy, Fish-derived products, Peanut-containing drug products, and Soap  Review of Systems   Review of Systems  HENT: Negative for trouble swallowing.   Cardiovascular: Positive for chest pain. Negative for orthopnea.  Gastrointestinal: Negative for abdominal pain.  Neurological: Negative for dizziness and numbness.  All other systems reviewed and are negative.   Physical Exam Updated Vital Signs BP 104/70 (BP Location: Right Arm)    Pulse 68    Temp (!) 97.5 F (36.4 C) (Oral)    Resp (!) 24    LMP 07/12/2016 (Approximate) Comment: 2-3 day cycle   SpO2 100%   Physical Exam Vitals and nursing note reviewed.  Constitutional:      Appearance: She is well-developed and well-nourished.  HENT:     Head: Normocephalic and atraumatic.     Nose: No congestion or rhinorrhea.     Mouth/Throat:     Mouth: Mucous membranes are moist.      Pharynx: Oropharynx is clear.  Eyes:     Pupils: Pupils are equal, round, and reactive to light.  Cardiovascular:     Rate and Rhythm: Normal rate and regular rhythm.  Pulmonary:     Effort: No respiratory distress.     Breath sounds: No stridor.  Abdominal:     General: There is no distension.  Musculoskeletal:        General: No swelling or tenderness. Normal range of motion.     Cervical back: Normal range of motion.  Skin:    General: Skin is warm and dry.  Neurological:     General: No focal deficit present.     Mental Status: She is alert.     ED Results / Procedures / Treatments   Labs (all labs ordered are listed, but only abnormal results are displayed) Labs Reviewed  CBC - Abnormal; Notable for the following components:      Result Value   Hemoglobin 11.9 (*)    All other components within normal limits  BASIC METABOLIC PANEL  TROPONIN I (HIGH SENSITIVITY)  TROPONIN I (HIGH SENSITIVITY)    EKG EKG Interpretation  Date/Time:  Friday October 01 2020 03:09:54 EST Ventricular Rate:  69 PR Interval:  160 QRS Duration: 78 QT Interval:  426 QTC Calculation: 456 R Axis:   -153 Text Interpretation: Normal sinus rhythm with sinus arrhythmia Right superior axis deviation Abnormal ECG Confirmed by Merrily Pew [phone]) on 10/01/2020 4:06:30 AM   Radiology No results found.  Procedures Procedures (including critical care time)  Medications Ordered in ED Medications - No data to display  ED Course  I have reviewed the triage vital signs and the nursing notes.  Pertinent labs & imaging results that were available during my care of the patient were reviewed by me and considered in my medical decision making (see chart for details).    MDM Rules/Calculators/A&P  Nonspecific atypical chest pain. Right upper. Worse with movement and palpation. Seems muscular in nature. Workup negative otherwise. Will fu outpatient for same.   Final  Clinical Impression(s) / ED Diagnoses Final diagnoses:  Nonspecific chest pain    Rx / DC Orders ED Discharge Orders         Ordered    cyclobenzaprine (FLEXERIL) 10 MG tablet  2 times daily PRN        10/01/20 0547    acetaminophen (TYLENOL) 325 MG tablet  Every 6 hours PRN        10/01/20 0547           Brylinn Teaney, Corene Cornea, MD 10/01/20 [phone]

## 2020-10-04 ENCOUNTER — Encounter (HOSPITAL_COMMUNITY): Payer: Medicare Other

## 2020-10-06 ENCOUNTER — Encounter (HOSPITAL_COMMUNITY): Payer: Medicare Other

## 2020-10-06 ENCOUNTER — Other Ambulatory Visit: Payer: Self-pay

## 2020-10-06 ENCOUNTER — Encounter (HOSPITAL_COMMUNITY)
Admission: RE | Admit: 2020-10-06 | Discharge: 2020-10-06 | Disposition: A | Discharge status: Home / Self Care | Payer: Medicare Other | Source: Ambulatory Visit | Attending: Internal Medicine | Admitting: Internal Medicine

## 2020-10-06 DIAGNOSIS — Z955 Presence of coronary angioplasty implant and graft: Secondary | ICD-10-CM

## 2020-10-06 DIAGNOSIS — I214 Non-ST elevation (NSTEMI) myocardial infarction: Secondary | ICD-10-CM

## 2020-10-06 NOTE — Progress Notes (Signed)
Incomplete Session Note  Patient Details  Name: Wendy [name] MRN: [id number] Date of Birth: January 03, 1966 Referring Provider:   Flowsheet Row CARDIAC REHAB PHASE II ORIENTATION from 09/14/2020 in [facility]  Referring Provider Dr. Rudean Haskell, MD      [name] did not complete her rehab session. [name] presented to her cardiac rehab exercise session complaining of back muscle spasms, left hip muscle spasms, and chest/arm muscle spasms. She has a history of chronic flexeril use from degenerative disk disease and spasms in her cervical and lower lumbar. She presented to the ED for atypical chest discomfort 10/01/20 for which was reproducible and flexeril and tylenol were prescribed. It was recommended that she follow up with her PCP for additional management. She recently received a letter in the mail that stated her current PCP was not in network, however 5 providers in the same office were listed. She is encouraged to not exercise today since her pain at rest with spasms is 6/10 and increases with the intensity of the spasm. It is recommended that patient utilize time that would be spent in today's exercise session to call provider office to see which PCP is taking new patients. Patient agrees. She will return to CR next week if spasms are improved. VS stable upon discharge from today's CR session.  Zyionna Pesce E. Rollene Rotunda RN, BSN [place]  Cardiac and Pulmonary Rehabilitation Phone: [phone] Fax: [phone]

## 2020-10-08 ENCOUNTER — Encounter (HOSPITAL_COMMUNITY): Payer: Self-pay | Admitting: Emergency Medicine

## 2020-10-08 ENCOUNTER — Encounter (HOSPITAL_COMMUNITY): Payer: Medicare Other

## 2020-10-08 ENCOUNTER — Emergency Department (HOSPITAL_COMMUNITY): Payer: Medicare Other

## 2020-10-08 ENCOUNTER — Emergency Department (HOSPITAL_COMMUNITY)
Admission: EM | Admit: 2020-10-08 | Discharge: 2020-10-08 | Disposition: A | Discharge status: Home / Self Care | Payer: Medicare Other | Attending: Emergency Medicine | Admitting: Emergency Medicine

## 2020-10-08 DIAGNOSIS — R079 Chest pain, unspecified: Secondary | ICD-10-CM | POA: Diagnosis not present

## 2020-10-08 DIAGNOSIS — Z91040 Latex allergy status: Secondary | ICD-10-CM | POA: Diagnosis not present

## 2020-10-08 DIAGNOSIS — I10 Essential (primary) hypertension: Secondary | ICD-10-CM | POA: Insufficient documentation

## 2020-10-08 DIAGNOSIS — Z955 Presence of coronary angioplasty implant and graft: Secondary | ICD-10-CM | POA: Insufficient documentation

## 2020-10-08 DIAGNOSIS — Z87891 Personal history of nicotine dependence: Secondary | ICD-10-CM | POA: Insufficient documentation

## 2020-10-08 DIAGNOSIS — R0789 Other chest pain: Secondary | ICD-10-CM | POA: Insufficient documentation

## 2020-10-08 DIAGNOSIS — J45909 Unspecified asthma, uncomplicated: Secondary | ICD-10-CM | POA: Diagnosis not present

## 2020-10-08 DIAGNOSIS — Z79899 Other long term (current) drug therapy: Secondary | ICD-10-CM | POA: Insufficient documentation

## 2020-10-08 DIAGNOSIS — I2510 Atherosclerotic heart disease of native coronary artery without angina pectoris: Secondary | ICD-10-CM | POA: Diagnosis not present

## 2020-10-08 DIAGNOSIS — Z91010 Allergy to peanuts: Secondary | ICD-10-CM | POA: Diagnosis not present

## 2020-10-08 DIAGNOSIS — Z7982 Long term (current) use of aspirin: Secondary | ICD-10-CM | POA: Diagnosis not present

## 2020-10-08 LAB — BASIC METABOLIC PANEL
Anion gap: 10 (ref 5–15)
BUN: 12 mg/dL (ref 6–20)
CO2: 24 mmol/L (ref 22–32)
Calcium: 9.2 mg/dL (ref 8.9–10.3)
Chloride: 105 mmol/L (ref 98–111)
Creatinine, Ser: 0.76 mg/dL (ref 0.44–1.00)
GFR, Estimated: >60 mL/min (ref >60)
Glucose, Bld: 116 mg/dL — ABNORMAL HIGH (ref 70–99)
Potassium: 3.5 mmol/L (ref 3.5–5.1)
Sodium: 139 mmol/L (ref 135–145)

## 2020-10-08 LAB — CBC
HCT: 36.5 % (ref 36.0–46.0)
Hemoglobin: 11.7 g/dL — ABNORMAL LOW (ref 12.0–15.0)
MCH: 28.5 pg (ref 26.0–34.0)
MCHC: 32.1 g/dL (ref 30.0–36.0)
MCV: 89 fL (ref 80.0–100.0)
Platelets: 251 10*3/uL (ref 150–400)
RBC: 4.1 MIL/uL (ref 3.87–5.11)
RDW: 12.5 % (ref 11.5–15.5)
WBC: 6.5 10*3/uL (ref 4.0–10.5)
nRBC: 0 % (ref 0.0–0.2)

## 2020-10-08 LAB — TROPONIN I (HIGH SENSITIVITY)
Troponin I (High Sensitivity): 7 ng/L (ref <18)
Troponin I (High Sensitivity): 7 ng/L (ref <18)

## 2020-10-08 MED ORDER — ISOSORBIDE MONONITRATE ER 30 MG PO TB24: 30.0000 mg | ORAL_TABLET | Freq: Every day | ORAL | 0 refills | Status: DC

## 2020-10-08 MED ORDER — OXYCODONE-ACETAMINOPHEN 5-325 MG PO TABS
1.0000 | ORAL_TABLET | Freq: Once | ORAL | Status: AC
Start: 2020-10-08 — End: 2020-10-08
  Administered 2020-10-08: 1 via ORAL
  Filled 2020-10-08: qty 1

## 2020-10-08 MED ORDER — LIDOCAINE VISCOUS HCL 2 % MT SOLN
15.0000 mL | Freq: Once | OROMUCOSAL | Status: AC
  Administered 2020-10-08: 15 mL via ORAL
  Filled 2020-10-08: qty 15

## 2020-10-08 MED ORDER — ISOSORBIDE MONONITRATE ER 30 MG PO TB24
30.0000 mg | ORAL_TABLET | Freq: Every day | ORAL | Status: DC
  Administered 2020-10-08: 30 mg via ORAL
  Filled 2020-10-08: qty 1

## 2020-10-08 MED ORDER — ALUM & MAG HYDROXIDE-SIMETH 200-200-20 MG/5ML PO SUSP
30.0000 mL | Freq: Once | ORAL | Status: AC
  Administered 2020-10-08: 30 mL via ORAL
  Filled 2020-10-08: qty 30

## 2020-10-08 MED FILL — METOPROLOL TARTRATE 25 MG T: 25 | 90 days supply | Qty: 180 | Fill #0

## 2020-10-08 NOTE — ED Notes (Signed)
E-signature pad unavailable at time of pt discharge. This RN discussed discharge materials with pt and answered all pt questions. Pt stated understanding of discharge material. ? ?

## 2020-10-08 NOTE — ED Notes (Signed)
Nicki, pt niece, called to get update please call [phone]

## 2020-10-08 NOTE — ED Provider Notes (Signed)
[facility] EMERGENCY DEPARTMENT Provider Note   CSN: [id number] Arrival date & time: 10/08/20  1707     History Chief Complaint  Patient presents with  . Chest Pain    Wendy [name] is a 55 y.o. female the history of CAD (NSTEMI 07/15/20 with mRCA DES, 50% pRCA; OM1 PCI, D1 50%) with preserved EF, HTN, HLD, Former Tobacco Use, GERD.  Patient presents with a chief complaint of central chest pain.  Reports that pain began approximately 5 PM this afternoon, pain started after eating popcorn.  Patient describes pain as a " sharp, dull, nagging pain," rates 7/10 on the pain scale, worse with movement, palpation and exertion, no alleviating factors.  No associated nausea, vomiting, or diaphoresis.  Patient reports that she did have some palpitations prior to chest pain beginning.  Per chart review patient does have history of PVC, recently was placed on monitor monitor Zio patch.  Patient reports that this chest pain is not as severe as when she had her heart attack in November.  Patient denies any shortness of breath, dyspnea on exertion, orthopnea, fevers, chills, cough, hemoptysis, abdominal pain, nausea or vomiting.    HPI     Past Medical History:  Diagnosis Date  . Anxiety   . Asthma    daily and prn inhalers  . Cervical disc disease    decreased range of motion  . Chronic pain    shoulders, neck, lower back  . Coronary artery disease   . Deformity, hand    left  . Depression   . Endometriosis    Lupron injection Q 3 mos.  . Fluid retention in legs   . Hyperlipidemia   . Hypertension   . Injury, brachial plexus    left  . Multiple allergies    takes allergy shots  . PONV (postoperative nausea and vomiting)   . 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 Wears partial dentures    upper partial    Patient Active Problem List   Diagnosis Date Noted  . Coronary artery disease involving native coronary artery of native heart  without angina pectoris 07/30/2020  . Essential hypertension 07/30/2020  . Hyperlipidemia 07/30/2020  . Gastroesophageal reflux disease without esophagitis 07/30/2020  . Nonrheumatic mitral valve regurgitation 07/30/2020  . Prolonged Q-T interval on ECG 07/30/2020  . NSTEMI (non-ST elevated myocardial infarction) ([place]) 07/15/2020  . BV (bacterial vaginosis) 03/06/2013  . ANXIETY 11/05/2007  . DEPRESSION 11/05/2007  . NAUSEA, CHRONIC 11/05/2007  . FIBROIDS, UTERUS 03/26/2007  . MIGRAINE, COMMON 03/26/2007  . OVARIAN CYST 03/26/2007  . 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 [place] DISEASE, LUMBOSACRAL SPINE 03/26/2007  . 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    Past Surgical History:  Procedure Laterality Date  . ANKLE RECONSTRUCTION  02/01/2012   Procedure: RECONSTRUCTION ANKLE;  Surgeon: Wylene Simmer, MD;  Location: [facility];  Service: Orthopedics;  Laterality: Right;  Excision of right calcaneonavicular coalition with autologus fat graft interposition  . BRACHIAL PLEXUS EXPLORATION    . CARDIAC CATHETERIZATION    . CORONARY STENT INTERVENTION N/A 07/15/2020   Procedure: CORONARY STENT INTERVENTION;  Surgeon: Jettie Booze, MD;  Location: [facility];  Service: Cardiovascular;  Laterality: N/A;  . DIAGNOSTIC LAPAROSCOPY  10/02/2008   peritoneal bx.  Marland Kitchen LEFT HEART CATH AND CORONARY ANGIOGRAPHY N/A 07/15/2020   Procedure: LEFT HEART CATH AND CORONARY ANGIOGRAPHY;  Surgeon: Jettie Booze, MD;  Location: [facility];  Service: Cardiovascular;  Laterality: N/A;  . MULTIPLE TOOTH EXTRACTIONS     upper teeth and wisdom teeth  . OVARIAN CYST REMOVAL  2000  . RIB RESECTION     left - cervical rib removal  . SHOULDER ARTHROSCOPY W/ ROTATOR CUFF REPAIR  07/2011   right  . SHOULDER SURGERY     left  . TOOTH EXTRACTION     x 1     OB History    Gravida  0   Para  0   Term  0   Preterm  0   AB  0   Living  0     SAB  0    IAB  0   Ectopic  0   Multiple  0   Live Births              Family History  Problem Relation Age of Onset  . Cancer Mother   . Dementia Father   . Stroke Father     Social History   Tobacco Use  . Smoking status: Former Smoker    Packs/day: 1.00    Years: 20.00    Pack years: 20.00    Types: Cigarettes    Quit date: 07/14/2020    Years since quitting: 0.2  . Smokeless tobacco: Never Used  Vaping Use  . Vaping Use: Never used  Substance Use Topics  . Alcohol use: No    Alcohol/week: 0.0 standard drinks    Comment: occasional  . Drug use: No    Home Medications Prior to Admission medications   Medication Sig Start Date End Date Taking? Authorizing Provider  acetaminophen (TYLENOL) 325 MG tablet Take 2 tablets (650 mg total) by mouth every 6 (six) hours as needed. 10/01/20   Mesner, Corene Cornea, MD  albuterol (PROVENTIL HFA;VENTOLIN HFA) 108 (90 Base) MCG/ACT inhaler Inhale 2 puffs into the lungs every 4 (four) hours as needed for wheezing or shortness of breath (cough). 11/20/16   Street, Alexander, PA-C  aspirin EC 81 MG EC tablet Take 1 tablet (81 mg total) by mouth daily. Swallow whole. 07/17/20   Bhagat, Crista Luria, PA  beclomethasone (QVAR) 80 MCG/ACT inhaler Inhale 1 puff into the lungs 2 (two) times daily as needed (sob/wheezing).     [provider]  celecoxib (CELEBREX) 200 MG capsule Take 200 mg by mouth daily. 09/10/19   [provider]  clopidogrel (PLAVIX) 75 MG tablet Take 1 tablet (75 mg total) by mouth daily. 09/21/20   Werner Lean, MD  cyclobenzaprine (FLEXERIL) 10 MG tablet Take 1 tablet (10 mg total) by mouth 2 (two) times daily as needed for muscle spasms. 10/01/20   Mesner, Corene Cornea, MD  EPINEPHrine (EPIPEN JR) 0.15 MG/0.3ML injection Inject 0.15 mg into the muscle daily as needed for anaphylaxis.     [provider]  ergocalciferol (VITAMIN D2) 50000 UNITS capsule Take 50,000 Units by mouth every Sunday. Sundays     [provider]  furosemide (LASIX) 20 MG tablet TAKE 1/2 TABLET(10 MG) BY MOUTH DAILY AS NEEDED FOR FLUID RETENTION OR SWELLING Patient taking differently: Take 10 mg by mouth daily as needed (fluid retention or swelling). 08/25/20   Chandrasekhar, Lyda Kalata A, MD  gabapentin (NEURONTIN) 600 MG tablet Take 600 mg by mouth at bedtime.    [provider]  hydrOXYzine (ATARAX/VISTARIL) 25 MG tablet Take 25 mg by mouth 3 (three) times daily as needed for anxiety or itching.  10/09/19   [provider]  metoprolol tartrate (LOPRESSOR) 25 MG tablet Take 1 tablet (25 mg total) by mouth 2 (two) times daily. 09/27/20   Chandrasekhar, Terisa Starr, MD  nitroGLYCERIN (NITROSTAT) 0.4 MG SL tablet Place 1 tablet (0.4 mg total) under the tongue every 5 (five) minutes x 3 doses as needed for chest pain. 07/16/20   Leanor Kail, PA  Oxcarbazepine (TRILEPTAL) 300 MG tablet Take 300 mg by mouth 2 (two) times daily. 09/23/20   [provider]  polyethylene glycol (MIRALAX) 17 g packet Take 17 g by mouth daily. Patient taking differently: Take 17 g by mouth daily as needed for mild constipation. 07/24/20   Scot Jun, FNP  potassium chloride SA (KLOR-CON) 20 MEQ tablet Take 20 mEq by mouth daily.    [provider]  rosuvastatin (CRESTOR) 40 MG tablet Take 1 tablet (40 mg total) by mouth daily. 07/16/20   Leanor Kail, PA    Allergies    Pfizer-biontech covid-19 vacc [covid-19 mrna vacc (moderna)], Chocolate, Ivp dye [iodinated diagnostic agents], Latex, Shellfish allergy, Fish-derived products, Peanut-containing drug products, and Soap  Review of Systems   Review of Systems  Constitutional: Negative for chills and fever.  Eyes: Negative for visual disturbance.  Respiratory: Negative for cough and shortness of breath.   Cardiovascular: Positive for chest pain and palpitations. Negative for leg swelling.  Gastrointestinal: Negative for abdominal pain, nausea  and vomiting.  Genitourinary: Negative for difficulty urinating and dysuria.  Musculoskeletal: Negative for back pain and neck pain.  Skin: Negative for color change and rash.  Neurological: Negative for dizziness, syncope, light-headedness and headaches.  Psychiatric/Behavioral: Negative for confusion.    Physical Exam Updated Vital Signs BP 109/67   Pulse 76   Temp 98.9 F (37.2 C) (Oral)   Resp 18   Ht 4\' 11"  (1.499 m)   Wt 83.9 kg   LMP 07/12/2016 (Approximate) Comment: 2-3 day cycle  SpO2 100%   BMI 37.37 kg/m   Physical Exam Vitals and nursing note reviewed.  Constitutional:      General: She is not in acute distress.    Appearance: She is obese. She is not ill-appearing, toxic-appearing or diaphoretic.  HENT:     Head: Normocephalic.  Eyes:     General: No scleral icterus.       Right eye: No discharge.        Left eye: No discharge.  Cardiovascular:     Rate and Rhythm: Normal rate.     Pulses:          Radial pulses are 3+ on the right side and 3+ on the left side.     Heart sounds: Normal heart sounds.  Pulmonary:     Effort: Pulmonary effort is normal. No tachypnea or bradypnea.     Breath sounds: Normal breath sounds. No stridor. No wheezing, rhonchi or rales.  Chest:     Chest wall: Tenderness present. No deformity, swelling or crepitus.     Comments: Xiphoid process TTP, reported pain to chest wall above xiphoid process when moving to a sitting position Abdominal:     General: Abdomen is protuberant. There is no distension.     Palpations: Abdomen is soft. There is no mass or pulsatile mass.     Tenderness: There is no abdominal tenderness. There is no guarding or rebound.  Musculoskeletal:     Cervical back: Neck supple.     Right lower leg: No swelling or tenderness. No edema.     Left lower leg: No  swelling or tenderness. No edema.  Skin:    General: Skin is warm and dry.  Neurological:     General: No focal deficit present.     Mental Status:  She is alert.  Psychiatric:        Behavior: Behavior is cooperative.     ED Results / Procedures / Treatments   Labs (all labs ordered are listed, but only abnormal results are displayed) Labs Reviewed  BASIC METABOLIC PANEL - Abnormal; Notable for the following components:      Result Value   Glucose, Bld 116 (*)    All other components within normal limits  CBC - Abnormal; Notable for the following components:   Hemoglobin 11.7 (*)    All other components within normal limits  TROPONIN I (HIGH SENSITIVITY)  TROPONIN I (HIGH SENSITIVITY)    EKG EKG Interpretation  Date/Time:  Friday October 08 2020 17:10:47 EST Ventricular Rate:  74 PR Interval:  168 QRS Duration: 76 QT Interval:  368 QTC Calculation: 408 R Axis:   -13 Text Interpretation: Normal sinus rhythm Normal ECG Confirmed by Madalyn Rob [phone]) on 10/08/2020 7:29:28 PM   Radiology DG Chest 2 View  Result Date: 10/08/2020 CLINICAL DATA:  Chest pain EXAM: CHEST - 2 VIEW COMPARISON:  12/12/2019, 07/14/2020 FINDINGS: The heart size and mediastinal contours are within normal limits. Aortic atherosclerosis. Both lungs are clear. The visualized skeletal structures are unremarkable. IMPRESSION: No active cardiopulmonary disease. Electronically Signed   By: Donavan Foil M.D.   On: 10/08/2020 17:30    Procedures Procedures   Medications Ordered in ED Medications  isosorbide mononitrate (IMDUR) 24 hr tablet 30 mg (30 mg Oral Given 10/08/20 2221)  alum & mag hydroxide-simeth (MAALOX/MYLANTA) 200-200-20 MG/5ML suspension 30 mL (30 mLs Oral Given 10/08/20 2015)    And  lidocaine (XYLOCAINE) 2 % viscous mouth solution 15 mL (15 mLs Oral Given 10/08/20 2016)  oxyCODONE-acetaminophen (PERCOCET/ROXICET) 5-325 MG per tablet 1 tablet (1 tablet Oral Given 10/08/20 2154)    ED Course  I have reviewed the triage vital signs and the nursing notes.  Pertinent labs & imaging results that were available during my care of the  patient were reviewed by me and considered in my medical decision making (see chart for details).    MDM Rules/Calculators/A&P                          Alert 55 year old female in no acute distress, nontoxic appearing.  Patient presents with chief complaint of central chest pain.  Pain is worse with palpation and movement.  Patient reports taking 160 mg prior to arrival in emergency department.  EKG shows sinus rhythm, no acute cardiopulmonary abnormality, troponin 7.  Less concerning for ACS however will wait for repeat troponin.  With pain on palpation and movement possible musculoskeletal issue.  Patient reports compliance with her Eliquis medication.  BMP shows glucose slightly elevated at 116, CBC shows hemoglobin slightly lower at 11.7.    Repeat troponin 7 with delta of 0.  Patient reports improvement in chest pain with GI cocktail and Percocet.  Patient did endorse worsening of pain with ambulation to bathroom.  Patient denies any chest pain at present.  Due to patient's chest pain , recent history of NSTEMI and PCI; cardiology was consulted.  Spoke with Dr. Mylo Red 21:45.  He recommended starting patient on 30 mg imdur to help with anginal pain.  He will contact patient's cardiology office to set  up follow-up within the next 1 to 2 weeks.  Patient was given 1 dose of imdur while in the emergency department.  Patient given 30-day prescription prescription for Imdur.  Patient given information on Imdur.    Discussed results, findings, treatment and follow up. Patient advised of return precautions. Patient verbalized understanding and agreed with plan.  Patient was discussed with and evaluated by Dr. Roslynn Amble.     Final Clinical Impression(s) / ED Diagnoses Final diagnoses:  Other chest pain    Rx / DC Orders ED Discharge Orders         Ordered    isosorbide mononitrate (IMDUR) 30 MG 24 hr tablet  Daily        10/08/20 2209           Loni Beckwith, [place] 10/09/20  0032    Lucrezia Starch, MD 10/09/20 [phone]

## 2020-10-08 NOTE — Discharge Instructions (Addendum)
You came to the hospital today to have your chest pain evaluated.  Your EKG, troponin, chest x-ray, and other lab work were reassuring that you are not having a heart attack.  Due to your pain the cardiologist recommended starting Imdur.  Please take 30 mg once a day.  Please follow-up with your cardiologist.  If you do not hear from them in 3 business days please call them.    Today you received medications that may make you sleepy or impair your ability to make decisions.  For the next 24 hours please do not drive, operate heavy machinery, care for a small child with out another adult present, or perform any activities that may cause harm to you or someone else if you were to fall asleep or be impaired.   Please return to the emergency department if: Your chest pain gets worse. You have a cough that gets worse, or you cough up blood. You have severe pain in your abdomen. You faint. You have sudden, unexplained chest discomfort. You have sudden, unexplained discomfort in your arms, back, neck, or jaw. You have shortness of breath at any time. You suddenly start to sweat, or your skin gets clammy. You feel nausea or you vomit. You suddenly feel lightheaded or dizzy. You have severe weakness, or unexplained weakness or fatigue. Your heart begins to beat quickly, or it feels like it is skipping beats.

## 2020-10-08 NOTE — ED Triage Notes (Signed)
Patient complains of dull central chest pain that started at approximately 1650 today. Denies any other complaints.

## 2020-10-09 ENCOUNTER — Encounter: Payer: Self-pay | Admitting: Physician Assistant

## 2020-10-09 NOTE — Progress Notes (Signed)
Received signout from cardiology fellow overnight that patient needs sooner post ED f/u. I have sent a message to our office's scheduling team requesting a follow-up appointment, and our office will call the patient with this information.  Tiasia Weberg PA-C

## 2020-10-11 ENCOUNTER — Encounter (HOSPITAL_COMMUNITY): Payer: Medicare Other

## 2020-10-12 MED FILL — ROSUVASTATIN CALCIUM 40 MG: 40 | 30 days supply | Qty: 30 | Fill #3

## 2020-10-13 ENCOUNTER — Encounter (HOSPITAL_COMMUNITY): Payer: Medicare Other

## 2020-10-13 DIAGNOSIS — I493 Ventricular premature depolarization: Secondary | ICD-10-CM | POA: Diagnosis not present

## 2020-10-13 DIAGNOSIS — R002 Palpitations: Secondary | ICD-10-CM | POA: Diagnosis not present

## 2020-10-14 ENCOUNTER — Ambulatory Visit: Payer: Medicare Other | Admitting: Internal Medicine

## 2020-10-14 ENCOUNTER — Other Ambulatory Visit: Payer: Self-pay | Admitting: Internal Medicine

## 2020-10-14 ENCOUNTER — Other Ambulatory Visit: Payer: Self-pay

## 2020-10-14 ENCOUNTER — Encounter: Payer: Self-pay | Admitting: Internal Medicine

## 2020-10-14 VITALS — BP 128/68 | HR 79 | Ht 59.0 in | Wt 189.8 lb

## 2020-10-14 DIAGNOSIS — E782 Mixed hyperlipidemia: Secondary | ICD-10-CM | POA: Diagnosis not present

## 2020-10-14 DIAGNOSIS — I2510 Atherosclerotic heart disease of native coronary artery without angina pectoris: Secondary | ICD-10-CM

## 2020-10-14 DIAGNOSIS — I493 Ventricular premature depolarization: Secondary | ICD-10-CM | POA: Diagnosis not present

## 2020-10-14 DIAGNOSIS — I340 Nonrheumatic mitral (valve) insufficiency: Secondary | ICD-10-CM

## 2020-10-14 MED ORDER — DILTIAZEM HCL ER COATED BEADS 120 MG PO CP24: 120.0000 mg | ORAL_CAPSULE | Freq: Every day | ORAL | 3 refills | Status: DC

## 2020-10-14 MED FILL — CARTIA XT 120 MG CP24: 120 | 90 days supply | Qty: 90 | Fill #0

## 2020-10-14 NOTE — Progress Notes (Signed)
.  Cardiology Office Note:    Date:  10/14/2020   ID:  MADORA BARLETTA, DOB 1966-05-24, MRN [id number]  PCP:  Lucianne Lei, MD  [facility] Cardiologist:  Werner Lean, MD  [place] Electrophysiologist:  None   Referring MD: Lucianne Lei, MD   Follow up:  ED visit.  History of Present Illness:    Wendy [name] is a 55 y.o. female with a hx of CAD (NSTEMI 07/16/20 with mRCA DES, 50% pRCA; OM1 PCI, D1 50%) with preserved EF, HTN, HLD, Former Tobacco Use, GERD, who presented 07/15/20 for NSTEMI. Seen 09/21/2020 with PVCs and ZioPatch was placed.  In interim, see 10/01/20 and 10/08/20 with chest pain; no change in novel biomarkers.  Started on 10/08/20.  Patient notes that she is doing OK.  Since last visit notes ED visit still has occasional twinges changes.  Relevant interval testing or therapy include Imdur 30 mg PO Daily.  [place] sick with percocet in ED.  Occasional nocturnal chest pain.  No exertional chest pain. No SOB/DOE and no PND/Orthopnea.  No weight gain or leg swelling.  No palpitations or syncope.  Notes slight headache with Imdur.  Ambulatory blood pressure 117/71.   Past Medical History:  Diagnosis Date  . Anxiety   . Asthma    daily and prn inhalers  . Cervical disc disease    decreased range of motion  . Chronic pain    shoulders, neck, lower back  . Coronary artery disease   . Deformity, hand    left  . Depression   . Endometriosis    Lupron injection Q 3 mos.  . Fluid retention in legs   . Hyperlipidemia   . Hypertension   . Injury, brachial plexus    left  . Multiple allergies    takes allergy shots  . PONV (postoperative nausea and vomiting)   . 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 Wears partial dentures    upper partial    Past Surgical History:  Procedure Laterality Date  . ANKLE RECONSTRUCTION  02/01/2012   Procedure: RECONSTRUCTION ANKLE;  Surgeon: Wylene Simmer, MD;  Location: [place];  Service: Orthopedics;  Laterality: Right;  Excision of right calcaneonavicular coalition with autologus fat graft interposition  . BRACHIAL PLEXUS EXPLORATION    . CARDIAC CATHETERIZATION    . CORONARY STENT INTERVENTION N/A 07/15/2020   Procedure: CORONARY STENT INTERVENTION;  Surgeon: Jettie Booze, MD;  Location: [place] [facility];  Service: Cardiovascular;  Laterality: N/A;  . DIAGNOSTIC LAPAROSCOPY  10/02/2008   peritoneal bx.  Marland Kitchen LEFT HEART CATH AND CORONARY ANGIOGRAPHY N/A 07/15/2020   Procedure: LEFT HEART CATH AND CORONARY ANGIOGRAPHY;  Surgeon: Jettie Booze, MD;  Location: [place] [facility];  Service: Cardiovascular;  Laterality: N/A;  . MULTIPLE TOOTH EXTRACTIONS     upper teeth and wisdom teeth  . OVARIAN CYST REMOVAL  2000  . RIB RESECTION     left - cervical rib removal  . SHOULDER ARTHROSCOPY W/ ROTATOR CUFF REPAIR  07/2011   right  . SHOULDER SURGERY     left  . TOOTH EXTRACTION     x 1   Current Medications: Current Meds  Medication Sig  . acetaminophen (TYLENOL) 325 MG tablet Take 2 tablets (650 mg total) by mouth every 6 (six) hours as needed.  Marland Kitchen albuterol (PROVENTIL HFA;VENTOLIN HFA) 108 (90 Base) MCG/ACT inhaler Inhale 2 puffs into the lungs every 4 (four)  hours as needed for wheezing or shortness of breath (cough).  Marland Kitchen aspirin EC 81 MG EC tablet Take 1 tablet (81 mg total) by mouth daily. Swallow whole.  . beclomethasone (QVAR) 80 MCG/ACT inhaler Inhale 1 puff into the lungs 2 (two) times daily as needed (sob/wheezing).   . clopidogrel (PLAVIX) 75 MG tablet Take 1 tablet (75 mg total) by mouth daily.  . cyclobenzaprine (FLEXERIL) 10 MG tablet Take 1 tablet (10 mg total) by mouth 2 (two) times daily as needed for muscle spasms.  Marland Kitchen diltiazem (CARDIZEM CD) 120 MG 24 hr capsule Take 1 capsule (120 mg total) by mouth daily.  Marland Kitchen EPINEPHrine (EPIPEN JR) 0.15 MG/0.3ML injection Inject 0.15 mg into the muscle daily as needed for anaphylaxis.   Marland Kitchen  ergocalciferol (VITAMIN D2) 50000 UNITS capsule Take 50,000 Units by mouth every Sunday. Sundays  . furosemide (LASIX) 20 MG tablet TAKE 1/2 TABLET(10 MG) BY MOUTH DAILY AS NEEDED FOR FLUID RETENTION OR SWELLING  . gabapentin (NEURONTIN) 600 MG tablet Take 600 mg by mouth at bedtime.  . hydrOXYzine (ATARAX/VISTARIL) 25 MG tablet Take 25 mg by mouth 3 (three) times daily as needed for anxiety or itching.   . isosorbide mononitrate (IMDUR) 30 MG 24 hr tablet Take 1 tablet (30 mg total) by mouth daily.  . metoprolol tartrate (LOPRESSOR) 25 MG tablet Take 1 tablet (25 mg total) by mouth 2 (two) times daily.  . nitroGLYCERIN (NITROSTAT) 0.4 MG SL tablet Place 1 tablet (0.4 mg total) under the tongue every 5 (five) minutes x 3 doses as needed for chest pain.  . polyethylene glycol (MIRALAX) 17 g packet Take 17 g by mouth daily.  . potassium chloride SA (KLOR-CON) 20 MEQ tablet Take 20 mEq by mouth daily.  . rosuvastatin (CRESTOR) 40 MG tablet Take 1 tablet (40 mg total) by mouth daily.     Allergies:   Pfizer-biontech covid-19 vacc [covid-19 mrna vacc (moderna)], Chocolate, Ivp dye [iodinated diagnostic agents], Latex, Shellfish allergy, Fish-derived products, Peanut-containing drug products, and Soap   Social History   Socioeconomic History  . Marital status: Single    Spouse name: Not on file  . Number of children: Not on file  . Years of education: 6  . Highest education level: Not on file  Occupational History  . Not on file  Tobacco Use  . Smoking status: Former Smoker    Packs/day: 1.00    Years: 20.00    Pack years: 20.00    Types: Cigarettes    Quit date: 07/14/2020    Years since quitting: 0.2  . Smokeless tobacco: Never Used  Vaping Use  . Vaping Use: Never used  Substance and Sexual Activity  . Alcohol use: No    Alcohol/week: 0.0 standard drinks    Comment: occasional  . Drug use: No  . Sexual activity: Yes    Partners: Male    Birth control/protection: None  Other  Topics Concern  . Not on file  Social History Narrative  . Not on file   Social Determinants of Health   Financial Resource Strain: Not on file  Food Insecurity: Not on file  Transportation Needs: Not on file  Physical Activity: Not on file  Stress: Not on file  Social Connections: Not on file    Family History: The patient's family history includes Cancer in her mother; Dementia in her father; Stroke in her father.  ROS:   Please see the history of present illness.     All other  systems reviewed and are negative.  EKGs/Labs/Other Studies Reviewed:    The following studies were reviewed today:  EKG:   09/21/20: Sinus rhythm rate 61 with QTc 440 07/19/2020: Sinus Rhythm 84 QTc 508  Recent Labs: 07/15/2020: B Natriuretic Peptide 135.9; TSH 0.618 09/21/2020: ALT 9 10/08/2020: BUN 12; Creatinine, Ser 0.76; Hemoglobin 11.7; Platelets 251; Potassium 3.5; Sodium 139  Recent Lipid Panel    Component Value Date/Time   CHOL 152 09/21/2020 0928   TRIG 49 09/21/2020 0928   HDL 67 09/21/2020 0928   CHOLHDL 2.3 09/21/2020 0928   CHOLHDL 3.1 07/15/2020 1730   VLDL 9 07/15/2020 1730   LDLCALC 74 09/21/2020 0928   Cardiac Event Monitoring: Date: 10/14/20 Results:  Patient had a minimum heart rate of 49 bpm, maximum heart rate of 174 bpm, and average heart rate of 76 bpm.  Predominant underlying rhythm was sinus rhythm.  One run of supraventricular tachycardia occurred lasting 4 beats at longest with a max rate of 174 bpm at fastest.  Isolated PACs were rare (<1.0%), with rare couplets present.  Isolated PVCs were rare (<1.0%).  No evidence of complete heart block .  Triggered and diary events associated with sinus rhythm.   No malignant arrhythmias.   Transthoracic Echocardiogram: Date: 1/11.22 Results: Mild to moderate MR; possible posterior restriction IMPRESSIONS  1. Left ventricular ejection fraction, by estimation, is 55 to 60%. The  left ventricle has normal  function. The left ventricle has no regional  wall motion abnormalities. Left ventricular diastolic parameters are  indeterminate.  2. Right ventricular systolic function is normal. The right ventricular  size is normal. There is normal pulmonary artery systolic pressure.  3. MR is eccentric, directed posterior into LA. The mitral valve is  normal in structure. Mild to moderate mitral valve regurgitation.  4. The aortic valve is normal in structure. Aortic valve regurgitation is  mild to moderate.   Left/Right Heart Catheterizations: Date:07/16/20 Results:  Prox RCA lesion is 50% stenosed. Vasospasm noted on this lesion which improved with IC NTG.  Mid RCA lesion is 99% stenosed. This was the culprit lesion which was ulcerated.  A drug-eluting stent was successfully placed using a STENT RESOLUTE ONYX 3.0X15, postdilated to 3.25 mm.  Post intervention, there is a 0% residual stenosis.  1st Mrg lesion is 80% stenosed.  A drug-eluting stent was successfully placed using a STENT RESOLUTE ONYX 2.5X18, postdilated to 3.0 mm.  Post intervention, there is a 0% residual stenosis.  Prox Cx lesion is 25% stenosed.  1st Diag lesion is 50% stenosed.  Prox LAD lesion is 25% stenosed.  The left ventricular systolic function is normal.  LV end diastolic pressure is moderately elevated.  The left ventricular ejection fraction is 55-65% by visual estimate.  There is no aortic valve stenosis.  Physical Exam:    VS:  BP 128/68   Pulse 79   Ht 4\' 11"  (1.499 m)   Wt 189 lb 12.8 oz (86.1 kg)   LMP 07/12/2016 (Approximate) Comment: 2-3 day cycle  SpO2 96%   BMI 38.33 kg/m     Wt Readings from Last 3 Encounters:  10/14/20 189 lb 12.8 oz (86.1 kg)  10/08/20 185 lb (83.9 kg)  09/29/20 185 lb (83.9 kg)    GEN: Well nourished, well developed in no acute distress HEENT: Normal NECK: No JVD; No carotid bruits LYMPHATICS: No lymphadenopathy CARDIAC: RRR, I/Vi holosystolic  murmur RESPIRATORY:  Clear to auscultation without rales, wheezing or rhonchi  ABDOMEN: Soft, non-tender, non-distended  MUSCULOSKELETAL:  No edema; No deformity  SKIN: Warm and dry NEUROLOGIC:  Alert and oriented x 3 PSYCHIATRIC:  Normal affect   ASSESSMENT:    1. Coronary artery disease involving native coronary artery of native heart without angina pectoris   2. Mixed hyperlipidemia   3. PVC (premature ventricular contraction)   4. Nonrheumatic mitral valve regurgitation    PLAN:    In order of problems listed above:  Coronary Artery Disease; Obstructive and possible vasospasm component HLD - asymptomatic - anatomy: 50% pRCA, mRCA PCI, OM1 PCI, pLCX 25%, D1 50%, pLAD 25% - continue ASA 81 mg; will switch to Plavix 75 mg PO daily with load one day prior - continue statin, goal LDL < 70; will recheck lipids LFTs at next appointment - continue BB - continue nitrates; Imdur 30 mg PO daily - will add CCB: diltiazem 120 mg XL Daily - Patient in cardiac rehab- can return  PVCs with palpitations - PVCs burden is minimal  [place] Regurgitation - mild to moderate regurgitation - stable on PRN lasix - will repeat echo at stent-anniversary (07/2021)  Essential Hypertension - ambulatory blood pressure, will start ambulatory BP monitoring; gave education on how to perform ambulatory blood pressure monitoring including the frequency and technique; goal ambulatory blood pressure < 135/85 on average - continue home medications except for above  Keep 01/24/21 follow up unless new symptoms or abnormal test results warranting change in plan  Time Spent Directly with Patient:   I have spent a total of 45 minutes with the patient reviewing hospital notes, telemetry, EKGs, labs and examining the patient as well as establishing an assessment and plan that was discussed personally with the patient and through the phone with her niece Lexine Baton.  > 50% of time was spent in direct patient  care.   Medication Adjustments/Labs and Tests Ordered: Current medicines are reviewed at length with the patient today.  Concerns regarding medicines are outlined above.  Orders Placed This Encounter  Procedures  . Hepatic function panel  . Lipid panel   Meds ordered this encounter  Medications  . diltiazem (CARDIZEM CD) 120 MG 24 hr capsule    Sig: Take 1 capsule (120 mg total) by mouth daily.    Dispense:  90 capsule    Refill:  3    Patient Instructions  Medication Instructions:  Your physician has recommended you make the following change in your medication:  START:  Diltiazem (Cardizem) 120mg  Daily   *If you need a refill on your cardiac medications before your next appointment, please call your pharmacy*   Lab Work: Jan 24, 2021 prior to appointment be sure to fast prior to appointment--LIPIDS AND LFTs--please come fasting to this lab appointment.  If you have labs (blood work) drawn today and your tests are completely normal, you will receive your results only by: Marland Kitchen MyChart Message (if you have MyChart) OR . A paper copy in the mail If you have any lab test that is abnormal or we need to change your treatment, we will call you to review the results.   Testing/Procedures: NONE   Follow-Up: At [facility], you and your health needs are our priority.  As part of our continuing mission to provide you with exceptional heart care, we have created designated Provider Care Teams.  These Care Teams include your primary Cardiologist (physician) and Advanced Practice Providers (APPs -  Physician Assistants and Nurse Practitioners) who all work together to provide you with the care you need,  when you need it.  We recommend signing up for the patient portal called "MyChart".  Sign up information is provided on this After Visit Summary.  MyChart is used to connect with patients for Virtual Visits (Telemedicine).  Patients are able to view lab/test results, encounter notes,  upcoming appointments, etc.  Non-urgent messages can be sent to your provider as well.   To learn more about what you can do with MyChart, go to [email or web address].    Your next appointment:   01/24/21 at 9:40am   Provider:  Gasper Sells, MD    Other Instructions Please check BP daily for at least 7 days send readings through [place] Provider has cleared you to restart Cardiac Rehab     Signed, Werner Lean, MD  10/14/2020 4:43 PM    [place]

## 2020-10-14 NOTE — Patient Instructions (Addendum)
Medication Instructions:  Your physician has recommended you make the following change in your medication:  START:  Diltiazem (Cardizem) 120mg  Daily   *If you need a refill on your cardiac medications before your next appointment, please call your pharmacy*   Lab Work: Jan 24, 2021 prior to appointment be sure to fast prior to appointment--LIPIDS AND LFTs--please come fasting to this lab appointment.  If you have labs (blood work) drawn today and your tests are completely normal, you will receive your results only by: Marland Kitchen MyChart Message (if you have MyChart) OR . A paper copy in the mail If you have any lab test that is abnormal or we need to change your treatment, we will call you to review the results.   Testing/Procedures: NONE   Follow-Up: At [facility], you and your health needs are our priority.  As part of our continuing mission to provide you with exceptional heart care, we have created designated Provider Care Teams.  These Care Teams include your primary Cardiologist (physician) and Advanced Practice Providers (APPs -  Physician Assistants and Nurse Practitioners) who all work together to provide you with the care you need, when you need it.  We recommend signing up for the patient portal called "MyChart".  Sign up information is provided on this After Visit Summary.  MyChart is used to connect with patients for Virtual Visits (Telemedicine).  Patients are able to view lab/test results, encounter notes, upcoming appointments, etc.  Non-urgent messages can be sent to your provider as well.   To learn more about what you can do with MyChart, go to [email or web address].    Your next appointment:   01/24/21 at 9:40am   Provider:  Gasper Sells, MD    Other Instructions Please check BP daily for at least 7 days send readings through [place] Provider has cleared you to restart Cardiac Rehab

## 2020-10-15 ENCOUNTER — Encounter (HOSPITAL_COMMUNITY): Payer: Medicare Other

## 2020-10-15 NOTE — Telephone Encounter (Signed)
-----   Message from Nuala Alpha, LPN sent at 0/04/1387  4:20 PM EST ----- Regarding: pt can resume back with scheduled cardiac rehab per [facility]. Gasper Sells saw this pt in clinic today, for recent 1 week post-hospital follow-up. He said she is cleared to return back to scheduled cardiac rehab with you guys. Wanted to reach out to you and let you know this, incase you needed to call the pt.    Thanks, [employer]

## 2020-10-18 ENCOUNTER — Encounter (HOSPITAL_COMMUNITY): Payer: Medicare Other

## 2020-10-19 ENCOUNTER — Encounter (HOSPITAL_COMMUNITY): Payer: Self-pay | Admitting: *Deleted

## 2020-10-19 DIAGNOSIS — Z955 Presence of coronary angioplasty implant and graft: Secondary | ICD-10-CM

## 2020-10-19 DIAGNOSIS — I214 Non-ST elevation (NSTEMI) myocardial infarction: Secondary | ICD-10-CM

## 2020-10-19 NOTE — Progress Notes (Signed)
Cardiac Individual Treatment Plan  Patient Details  Name: Wendy [name] MRN: [id number] Date of Birth: Feb 08, 1966 Referring Provider:   Flowsheet Row CARDIAC REHAB PHASE II ORIENTATION from 09/14/2020 in [facility]  Referring Provider Dr. Rudean Haskell, MD      Initial Encounter Date:  [place] PHASE II ORIENTATION from 09/14/2020 in [facility]  Date 09/14/20      Visit Diagnosis: NSTEMI (non-ST elevated myocardial infarction) [facility]) 07/14/20  07/15/20 S/P DES RCA, OM1  Patient's Home Medications on Admission:  Current Outpatient Medications:  .  acetaminophen (TYLENOL) 325 MG tablet, Take 2 tablets (650 mg total) by mouth every 6 (six) hours as needed., Disp: 30 tablet, Rfl: 0 .  albuterol (PROVENTIL HFA;VENTOLIN HFA) 108 (90 Base) MCG/ACT inhaler, Inhale 2 puffs into the lungs every 4 (four) hours as needed for wheezing or shortness of breath (cough)., Disp: 1 Inhaler, Rfl: 0 .  aspirin EC 81 MG EC tablet, Take 1 tablet (81 mg total) by mouth daily. Swallow whole., Disp: 30 tablet, Rfl: 11 .  beclomethasone (QVAR) 80 MCG/ACT inhaler, Inhale 1 puff into the lungs 2 (two) times daily as needed (sob/wheezing). , Disp: , Rfl:  .  clopidogrel (PLAVIX) 75 MG tablet, Take 1 tablet (75 mg total) by mouth daily., Disp: 90 tablet, Rfl: 3 .  cyclobenzaprine (FLEXERIL) 10 MG tablet, Take 1 tablet (10 mg total) by mouth 2 (two) times daily as needed for muscle spasms., Disp: 20 tablet, Rfl: 0 .  diltiazem (CARDIZEM CD) 120 MG 24 hr capsule, Take 1 capsule (120 mg total) by mouth daily., Disp: 90 capsule, Rfl: 3 .  EPINEPHrine (EPIPEN JR) 0.15 MG/0.3ML injection, Inject 0.15 mg into the muscle daily as needed for anaphylaxis. , Disp: , Rfl:  .  ergocalciferol (VITAMIN D2) 50000 UNITS capsule, Take 50,000 Units by mouth every Sunday. Sundays, Disp: , Rfl:  .  furosemide (LASIX) 20 MG tablet, TAKE 1/2 TABLET(10  MG) BY MOUTH DAILY AS NEEDED FOR FLUID RETENTION OR SWELLING, Disp: 45 tablet, Rfl: 3 .  gabapentin (NEURONTIN) 600 MG tablet, Take 600 mg by mouth at bedtime., Disp: , Rfl:  .  hydrOXYzine (ATARAX/VISTARIL) 25 MG tablet, Take 25 mg by mouth 3 (three) times daily as needed for anxiety or itching. , Disp: , Rfl:  .  isosorbide mononitrate (IMDUR) 30 MG 24 hr tablet, Take 1 tablet (30 mg total) by mouth daily., Disp: 30 tablet, Rfl: 0 .  metoprolol tartrate (LOPRESSOR) 25 MG tablet, Take 1 tablet (25 mg total) by mouth 2 (two) times daily., Disp: 180 tablet, Rfl: 3 .  nitroGLYCERIN (NITROSTAT) 0.4 MG SL tablet, Place 1 tablet (0.4 mg total) under the tongue every 5 (five) minutes x 3 doses as needed for chest pain., Disp: 25 tablet, Rfl: 12 .  polyethylene glycol (MIRALAX) 17 g packet, Take 17 g by mouth daily., Disp: 14 each, Rfl: 0 .  potassium chloride SA (KLOR-CON) 20 MEQ tablet, Take 20 mEq by mouth daily., Disp: , Rfl:  .  rosuvastatin (CRESTOR) 40 MG tablet, Take 1 tablet (40 mg total) by mouth daily., Disp: 30 tablet, Rfl: 6  Past Medical History: Past Medical History:  Diagnosis Date  . Anxiety   . Asthma    daily and prn inhalers  . Cervical disc disease    decreased range of motion  . Chronic pain    shoulders, neck, lower back  . Coronary artery disease   .  Deformity, hand    left  . Depression   . Endometriosis    Lupron injection Q 3 mos.  . Fluid retention in legs   . Hyperlipidemia   . Hypertension   . Injury, brachial plexus    left  . Multiple allergies    takes allergy shots  . PONV (postoperative nausea and vomiting)   . 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 Wears partial dentures    upper partial    Tobacco Use: Social History   Tobacco Use  Smoking Status Former Smoker  . Packs/day: 1.00  . Years: 20.00  . Pack years: 20.00  . Types: Cigarettes  . Quit date: 07/14/2020  . Years since quitting: 0.2  Smokeless Tobacco Never  Used    Labs: Recent Review Scientist, physiological    Labs for ITP Cardiac and Pulmonary Rehab Latest Ref Rng & Units 04/13/2008 07/15/2020 09/21/2020   Cholestrol 100 - 199 mg/dL - 248(H) 152   LDLCALC 0 - 99 mg/dL - 159(H) 74   HDL >39 mg/dL - 80 67   Trlycerides 0 - 149 mg/dL - 46 49   Hemoglobin A1c 4.8 - 5.6 % - 5.0 -   TCO2 - 19 - -      Capillary Blood Glucose: No results found for: GLUCAP   Exercise Target Goals: Exercise Program Goal: Individual exercise prescription set using results from initial 6 min walk test and THRR while considering  patient's activity barriers and safety.   Exercise Prescription Goal: Starting with aerobic activity 30 plus minutes a day, 3 days per week for initial exercise prescription. Provide home exercise prescription and guidelines that participant acknowledges understanding prior to discharge.  Activity Barriers & Risk Stratification:  Activity Barriers & Cardiac Risk Stratification - 09/14/20 1510      Activity Barriers & Cardiac Risk Stratification   Activity Barriers Arthritis;Back Problems;Neck/Spine Problems;Joint Problems;Deconditioning;Shortness of Breath    Cardiac Risk Stratification High           6 Minute Walk:  6 Minute Walk    Row Name 09/14/20 1506         6 Minute Walk   Distance 800 feet     Walk Time 6 minutes     # of Rest Breaks 1  5:03 - 6:00 (57 seconds)     MPH 1.51     METS 2.21     RPE 13     Perceived Dyspnea  2     VO2 Peak 7.73     Symptoms Yes (comment)     Comments Low back pain/bilateral quadriceps pain 4/10 (chronic per pt), SOB, RPD = 2     Resting HR 87 bpm     Resting BP 116/52     Resting Oxygen Saturation  97 %     Exercise Oxygen Saturation  during 6 min walk 99 %     Max Ex. HR 97 bpm     Max Ex. BP 124/60     2 Minute Post BP 110/68            Oxygen Initial Assessment:   Oxygen Re-Evaluation:   Oxygen Discharge (Final Oxygen Re-Evaluation):   Initial Exercise Prescription:   Initial Exercise Prescription - 09/14/20 1500      Date of Initial Exercise RX and Referring Provider   Date 09/14/20    Referring Provider Dr. Rudean Haskell, MD    Expected Discharge Date 11/12/20      NuStep  Level 1    SPM 75    Minutes 15    METs 1.8      Track   Laps 8    Minutes 15    METs 1.93      Prescription Details   Frequency (times per week) 3    Duration Progress to 30 minutes of continuous aerobic without signs/symptoms of physical distress      Intensity   THRR 40-80% of Max Heartrate 66-133    Ratings of Perceived Exertion 11-13    Perceived Dyspnea 0-4      Progression   Progression Continue progressive overload as per policy without signs/symptoms or physical distress.      Resistance Training   Training Prescription Yes    Weight 2 lbs    Reps 10-15           Perform Capillary Blood Glucose checks as needed.  Exercise Prescription Changes:  Exercise Prescription Changes    Row Name 09/20/20 1400             Response to Exercise   Blood Pressure (Admit) 100/54       Blood Pressure (Exercise) 108/70       Blood Pressure (Exit) 112/70       Heart Rate (Admit) 87 bpm       Heart Rate (Exercise) 93 bpm       Heart Rate (Exit) 83 bpm       Rating of Perceived Exertion (Exercise) 11       Symptoms None       Comments Pt's first day of exercise in the CRP2 program       Duration Progress to 30 minutes of  aerobic without signs/symptoms of physical distress       Intensity THRR unchanged               Progression   Progression Continue to progress workloads to maintain intensity without signs/symptoms of physical distress.       Average METs 1.8               Resistance Training   Training Prescription Yes       Weight 2 lbs       Reps 10-15       Time 10 Minutes               NuStep   Level 1       SPM 75       Minutes 20       METs 1.3               Track   Laps 8       Minutes 10       METs 2.39               Exercise Comments:  Exercise Comments    Row Name 09/20/20 1445 10/19/20 0859         Exercise Comments Pt's first day of exercise in the CRP2 prpogram. Pt tolerated session well with no compalints. Pt has not attened the CRP2 program since 09/29/2020.             Exercise Goals and Review:  Exercise Goals    Row Name 09/14/20 1510             Exercise Goals   Increase Physical Activity Yes       Intervention Provide advice, education, support and counseling about physical activity/exercise needs.;Develop an individualized  exercise prescription for aerobic and resistive training based on initial evaluation findings, risk stratification, comorbidities and participant's personal goals.       Expected Outcomes Short Term: Attend rehab on a regular basis to increase amount of physical activity.;Long Term: Add in home exercise to make exercise part of routine and to increase amount of physical activity.;Long Term: Exercising regularly at least 3-5 days a week.       Increase Strength and Stamina Yes       Intervention Provide advice, education, support and counseling about physical activity/exercise needs.;Develop an individualized exercise prescription for aerobic and resistive training based on initial evaluation findings, risk stratification, comorbidities and participant's personal goals.       Expected Outcomes Short Term: Increase workloads from initial exercise prescription for resistance, speed, and METs.;Short Term: Perform resistance training exercises routinely during rehab and add in resistance training at home;Long Term: Improve cardiorespiratory fitness, muscular endurance and strength as measured by increased METs and functional capacity (6MWT)       Able to understand and use rate of perceived exertion (RPE) scale Yes       Intervention Provide education and explanation on how to use RPE scale       Expected Outcomes Short Term: Able to use RPE daily in rehab to express  subjective intensity level;Long Term:  Able to use RPE to guide intensity level when exercising independently       Knowledge and understanding of Target Heart Rate Range (THRR) Yes       Intervention Provide education and explanation of THRR including how the numbers were predicted and where they are located for reference       Expected Outcomes Short Term: Able to state/look up THRR;Short Term: Able to use daily as guideline for intensity in rehab;Long Term: Able to use THRR to govern intensity when exercising independently       Understanding of Exercise Prescription Yes       Intervention Provide education, explanation, and written materials on patient's individual exercise prescription       Expected Outcomes Short Term: Able to explain program exercise prescription;Long Term: Able to explain home exercise prescription to exercise independently              Exercise Goals Re-Evaluation :  Exercise Goals Re-Evaluation    [place] Name 09/20/20 1443             Exercise Goal Re-Evaluation   Exercise Goals Review Increase Physical Activity;Increase Strength and Stamina;Able to understand and use rate of perceived exertion (RPE) scale;Knowledge and understanding of Target Heart Rate Range (THRR);Understanding of Exercise Prescription       Comments Pt's first day of exercise in the CRP2 program. Pt understands RPE scale, THRR, and Exercise Rx.       Expected Outcomes Will continue to monitor patient and progress exercise workloads as tolerated.               Discharge Exercise Prescription (Final Exercise Prescription Changes):  Exercise Prescription Changes - 09/20/20 1400      Response to Exercise   Blood Pressure (Admit) 100/54    Blood Pressure (Exercise) 108/70    Blood Pressure (Exit) 112/70    Heart Rate (Admit) 87 bpm    Heart Rate (Exercise) 93 bpm    Heart Rate (Exit) 83 bpm    Rating of Perceived Exertion (Exercise) 11    Symptoms None    Comments Pt's first day of  exercise in the CRP2  program    Duration Progress to 30 minutes of  aerobic without signs/symptoms of physical distress    Intensity THRR unchanged      Progression   Progression Continue to progress workloads to maintain intensity without signs/symptoms of physical distress.    Average METs 1.8      Resistance Training   Training Prescription Yes    Weight 2 lbs    Reps 10-15    Time 10 Minutes      NuStep   Level 1    SPM 75    Minutes 20    METs 1.3      Track   Laps 8    Minutes 10    METs 2.39           Nutrition:  Target Goals: Understanding of nutrition guidelines, daily intake of sodium 1500mg , cholesterol 200mg , calories 30% from fat and 7% or less from saturated fats, daily to have 5 or more servings of fruits and vegetables.  Biometrics:  Pre Biometrics - 09/14/20 1330      Pre Biometrics   Waist Circumference 38.5 inches    Hip Circumference 46.5 inches    Waist to Hip Ratio 0.83 %    Triceps Skinfold 45 mm    % Body Fat 48.3 %    Grip Strength 32 kg    Flexibility 11.25 in    Single Leg Stand 15.56 seconds            Nutrition Therapy Plan and Nutrition Goals:  Nutrition Therapy & Goals - 09/29/20 1452      Nutrition Therapy   Diet TLC    Drug/Food Interactions Statins/Certain Fruits      Personal Nutrition Goals   Nutrition Goal Pt to identify food quantities necessary to achieve weight loss of 6-24 lb at graduation from cardiac rehab.    Personal Goal #2 Pt to build a healthy plate including vegetables, fruits, whole grains, and low-fat dairy products in a heart healthy meal plan.      Intervention Plan   Intervention Prescribe, educate and counsel regarding individualized specific dietary modifications aiming towards targeted core components such as weight, hypertension, lipid management, diabetes, heart failure and other comorbidities.    Expected Outcomes Long Term Goal: Adherence to prescribed nutrition plan.;Short Term Goal: A plan  has been developed with personal nutrition goals set during dietitian appointment.           Nutrition Assessments:  MEDIFICTS Score Key:  ?70 Need to make dietary changes   40-70 Heart Healthy Diet  ? 40 Therapeutic Level Cholesterol Diet  Flowsheet Row CARDIAC REHAB PHASE II EXERCISE from 09/29/2020 in [place]  Picture Your Plate Total Score on Admission 67     Picture Your Plate Scores:  <16 Unhealthy dietary pattern with much room for improvement.  41-50 Dietary pattern unlikely to meet recommendations for good health and room for improvement.  51-60 More healthful dietary pattern, with some room for improvement.   >60 Healthy dietary pattern, although there may be some specific behaviors that could be improved.    Nutrition Goals Re-Evaluation:  Nutrition Goals Re-Evaluation    [place] Name 09/29/20 1455             Goals   Current Weight 185 lb (83.9 kg)       Nutrition Goal Pt to identify food quantities necessary to achieve weight loss of 6-24 lb at graduation from cardiac rehab.  Personal Goal #2 Re-Evaluation   Personal Goal #2 Pt to build a healthy plate including vegetables, fruits, whole grains, and low-fat dairy products in a heart healthy meal plan.              Nutrition Goals Discharge (Final Nutrition Goals Re-Evaluation):  Nutrition Goals Re-Evaluation - 09/29/20 1455      Goals   Current Weight 185 lb (83.9 kg)    Nutrition Goal Pt to identify food quantities necessary to achieve weight loss of 6-24 lb at graduation from cardiac rehab.      Personal Goal #2 Re-Evaluation   Personal Goal #2 Pt to build a healthy plate including vegetables, fruits, whole grains, and low-fat dairy products in a heart healthy meal plan.           Psychosocial: Target Goals: Acknowledge presence or absence of significant depression and/or stress, maximize coping skills, provide positive support system.  Participant is able to verbalize types and ability to use techniques and skills needed for reducing stress and depression.  Initial Review & Psychosocial Screening:  Initial Psych Review & Screening - 09/15/20 1020      Initial Review   Current issues with Current Stress Concerns;Current Anxiety/Panic    Source of Stress Concerns Family;Financial;Unable to perform yard/household activities    Comments Dollie has history of anxiety and depression.  Pt receives couseling virtually and medication at the [facility] in [place].  Heily is currently out of her medication - Seroquel and plans to contact her provider today for renewal of her prescription. Jalayia lives alone and financially this is stressful due to the demand of running a household particular since she has not returned back to work.      Family Dynamics   Concerns No support system    Comments Although she has family, they are not supporive of her needs.  Jasminne feels by herself.      Barriers   Psychosocial barriers to participate in program The patient should benefit from training in stress management and relaxation.      Screening Interventions   Interventions Provide feedback about the scores to participant;To provide support and resources with identified psychosocial needs;Encouraged to exercise    Expected Outcomes Short Term goal: Utilizing psychosocial counselor, staff and physician to assist with identification of specific Stressors or current issues interfering with healing process. Setting desired goal for each stressor or current issue identified.;Long Term Goal: Stressors or current issues are controlled or eliminated.;Short Term goal: Identification and review with participant of any Quality of Life or Depression concerns found by scoring the questionnaire.;Long Term goal: The participant improves quality of Life and PHQ9 Scores as seen by post scores and/or verbalization of changes           Quality of Life  Scores:  Quality of Life - 09/14/20 1542      Quality of Life   Select Quality of Life      Quality of Life Scores   Health/Function Pre 13.2 %    Socioeconomic Pre 15.13 %    Psych/Spiritual Pre 19.71 %    Family Pre 9.6 %    GLOBAL Pre 14.43 %          Scores of 19 and below usually indicate a poorer quality of life in these areas.  A difference of  2-3 points is a clinically meaningful difference.  A difference of 2-3 points in the total score of the Quality of Life Index has been associated with  significant improvement in overall quality of life, self-image, physical symptoms, and general health in studies assessing change in quality of life.  PHQ-9: Recent Review Flowsheet Data    Depression screen [facility] 2/9 09/15/2020   Decreased Interest 2   Down, Depressed, Hopeless 3   PHQ - 2 Score 5   Altered sleeping 2   Tired, decreased energy 2   Change in appetite 0   Feeling bad or failure about yourself  1   Trouble concentrating 0   Moving slowly or fidgety/restless 0   Suicidal thoughts 0   PHQ-9 Score 10   Difficult doing work/chores Very difficult     Interpretation of Total Score  Total Score Depression Severity:  1-4 = Minimal depression, 5-9 = Mild depression, 10-14 = Moderate depression, 15-19 = Moderately severe depression, 20-27 = Severe depression   Psychosocial Evaluation and Intervention:   Psychosocial Re-Evaluation:  Psychosocial Re-Evaluation    [place] Name 09/20/20 1613 10/19/20 1438           Psychosocial Re-Evaluation   Current issues with Current Stress Concerns;Current Anxiety/Panic;Current Depression Current Stress Concerns;Current Anxiety/Panic;Current Depression      Comments Reviewwed quality of life questionnaire today withb the patient. Will reassess when the patient returns to exercise at cardiac rehab.      Expected Outcomes Nayellie will have decreased anxiety and depression upon completion of phase 2 cardiacc rehab. Haeven will have decreased  anxiety and depression upon completion of phase 2 cardiacc rehab.      Interventions Encouraged to attend Cardiac Rehabilitation for the exercise;Relaxation education;Stress management education Encouraged to attend Cardiac Rehabilitation for the exercise;Relaxation education;Stress management education      Comments Jakeya has history of anxiety and depression.  Pt receives couseling virtually and medication at the [facility] in [place].  Lunetta is currently out of her medication - Seroquel and plans to contact her provider today for renewal of her prescription. Janine lives alone and financially this is stressful due to the demand of running a household particular since she has not returned back to work. Ninetta has history of anxiety and depression.  Pt receives couseling virtually and medication at the [facility] in [place].  Torria is currently out of her medication - Seroquel and plans to contact her provider today for renewal of her prescription. Yvonnia lives alone and financially this is stressful due to the demand of running a household particular since she has not returned back to work.             Initial Review   Source of Stress Concerns Family;Financial;Unable to perform yard/household activities Family;Financial;Unable to perform yard/household activities             Psychosocial Discharge (Final Psychosocial Re-Evaluation):  Psychosocial Re-Evaluation - 10/19/20 1438      Psychosocial Re-Evaluation   Current issues with Current Stress Concerns;Current Anxiety/Panic;Current Depression    Comments Will reassess when the patient returns to exercise at cardiac rehab.    Expected Outcomes Lakeena will have decreased anxiety and depression upon completion of phase 2 cardiacc rehab.    Interventions Encouraged to attend Cardiac Rehabilitation for the exercise;Relaxation education;Stress management education    Comments Rilya has history of anxiety and depression.  Pt  receives couseling virtually and medication at the [facility] in [place].  Chela is currently out of her medication - Seroquel and plans to contact her provider today for renewal of her prescription. Burnis lives alone and financially this is stressful due to the  demand of running a household particular since she has not returned back to work.      Initial Review   Source of Stress Concerns Family;Financial;Unable to perform yard/household activities           Vocational Rehabilitation: Provide vocational rehab assistance to qualifying candidates.   Vocational Rehab Evaluation & Intervention:  Vocational Rehab - 09/15/20 1236      Initial Vocational Rehab Evaluation & Intervention   Assessment shows need for Vocational Rehabilitation No   Crista works part time and is on disability. Zunaira does not need vocational rehab at this time.          Education: Education Goals: Education classes will be provided on a weekly basis, covering required topics. Participant will state understanding/return demonstration of topics presented.  Learning Barriers/Preferences:  Learning Barriers/Preferences - 09/14/20 1522      Learning Barriers/Preferences   Learning Barriers Sight   wears reading glasses   Learning Preferences Audio;Computer/Internet;Group Instruction;Individual Instruction;Pictoral;Skilled Demonstration;Verbal Instruction;Video;Written Material           Education Topics: Hypertension, Hypertension Reduction -Define heart disease and high blood pressure. Discus how high blood pressure affects the body and ways to reduce high blood pressure.   Exercise and Your Heart -Discuss why it is important to exercise, the FITT principles of exercise, normal and abnormal responses to exercise, and how to exercise safely.   Angina -Discuss definition of angina, causes of angina, treatment of angina, and how to decrease risk of having angina.   Cardiac  Medications -Review what the following cardiac medications are used for, how they affect the body, and side effects that may occur when taking the medications.  Medications include Aspirin, Beta blockers, calcium channel blockers, ACE Inhibitors, angiotensin receptor blockers, diuretics, digoxin, and antihyperlipidemics.   Congestive Heart Failure -Discuss the definition of CHF, how to live with CHF, the signs and symptoms of CHF, and how keep track of weight and sodium intake.   Heart Disease and Intimacy -Discus the effect sexual activity has on the heart, how changes occur during intimacy as we age, and safety during sexual activity.   Smoking Cessation / COPD -Discuss different methods to quit smoking, the health benefits of quitting smoking, and the definition of COPD.   Nutrition I: Fats -Discuss the types of cholesterol, what cholesterol does to the heart, and how cholesterol levels can be controlled.   Nutrition II: Labels -Discuss the different components of food labels and how to read food label   Heart Parts/Heart Disease and PAD -Discuss the anatomy of the heart, the pathway of blood circulation through the heart, and these are affected by heart disease.   Stress I: Signs and Symptoms -Discuss the causes of stress, how stress may lead to anxiety and depression, and ways to limit stress.   Stress II: Relaxation -Discuss different types of relaxation techniques to limit stress.   Warning Signs of Stroke / TIA -Discuss definition of a stroke, what the signs and symptoms are of a stroke, and how to identify when someone is having stroke.   Knowledge Questionnaire Score:  Knowledge Questionnaire Score - 09/14/20 1545      Knowledge Questionnaire Score   Pre Score 20/24           Core Components/Risk Factors/Patient Goals at Admission:  Personal Goals and Risk Factors at Admission - 09/14/20 1522      Core Components/Risk Factors/Patient Goals on Admission     Weight Management Yes;Obesity;Weight Loss    Intervention  Weight Management: Develop a combined nutrition and exercise program designed to reach desired caloric intake, while maintaining appropriate intake of nutrient and fiber, sodium and fats, and appropriate energy expenditure required for the weight goal.;Weight Management: Provide education and appropriate resources to help participant work on and attain dietary goals.;Weight Management/Obesity: Establish reasonable short term and long term weight goals.;Obesity: Provide education and appropriate resources to help participant work on and attain dietary goals.    Admit Weight 183 lb 13.8 oz (83.4 kg)    Expected Outcomes Short Term: Continue to assess and modify interventions until short term weight is achieved;Long Term: Adherence to nutrition and physical activity/exercise program aimed toward attainment of established weight goal;Weight Maintenance: Understanding of the daily nutrition guidelines, which includes 25-35% calories from fat, 7% or less cal from saturated fats, less than 200mg  cholesterol, less than 1.5gm of sodium, & 5 or more servings of fruits and vegetables daily;Weight Loss: Understanding of general recommendations for a balanced deficit meal plan, which promotes 1-2 lb weight loss per week and includes a negative energy balance of [phone] kcal/d;Understanding recommendations for meals to include 15-35% energy as protein, 25-35% energy from fat, 35-60% energy from carbohydrates, less than 200mg  of dietary cholesterol, 20-35 gm of total fiber daily;Understanding of distribution of calorie intake throughout the day with the consumption of 4-5 meals/snacks    Hypertension Yes    Intervention Provide education on lifestyle modifcations including regular physical activity/exercise, weight management, moderate sodium restriction and increased consumption of fresh fruit, vegetables, and low fat dairy, alcohol moderation, and smoking  cessation.;Monitor prescription use compliance.    Expected Outcomes Short Term: Continued assessment and intervention until BP is < 140/20mm HG in hypertensive participants. < 130/47mm HG in hypertensive participants with diabetes, heart failure or chronic kidney disease.;Long Term: Maintenance of blood pressure at goal levels.    Lipids Yes    Intervention Provide education and support for participant on nutrition & aerobic/resistive exercise along with prescribed medications to achieve LDL 70mg , HDL >40mg .    Expected Outcomes Short Term: Participant states understanding of desired cholesterol values and is compliant with medications prescribed. Participant is following exercise prescription and nutrition guidelines.;Long Term: Cholesterol controlled with medications as prescribed, with individualized exercise RX and with personalized nutrition plan. Value goals: LDL < 70mg , HDL > 40 mg.    Stress Yes    Intervention Offer individual and/or small group education and counseling on adjustment to heart disease, stress management and health-related lifestyle change. Teach and support self-help strategies.;Refer participants experiencing significant psychosocial distress to appropriate mental health specialists for further evaluation and treatment. When possible, include family members and significant others in education/counseling sessions.    Expected Outcomes Short Term: Participant demonstrates changes in health-related behavior, relaxation and other stress management skills, ability to obtain effective social support, and compliance with psychotropic medications if prescribed.           Core Components/Risk Factors/Patient Goals Review:   Goals and Risk Factor Review    Row Name 09/20/20 1412 09/20/20 1617 10/19/20 1439         Core Components/Risk Factors/Patient Goals Review   Personal Goals Review Weight Management/Obesity;Lipids;Hypertension;Stress Weight  Management/Obesity;Lipids;Hypertension;Stress;Tobacco Cessation Weight Management/Obesity;Lipids;Hypertension;Stress;Tobacco Cessation     Review Tere started exercise on 09/20/20. Decreased ectopy noted. Quality of life questionnaire reviewed Meekah started exercise on 09/20/20. Decreased ectopy noted. Quality of life questionnaire reviewed. Vital signs were stable. patient continues to abstain from smoking. Will offer emotinal support for anxiety depression as needed Patient's last  exercise session was on 09/29/20. Awaiting return to exercise     Expected Outcomes -- Gina will continue to participte in phase 2 cardiac rehab for exercise, nutrtion and lifestyle modifications Karolee will continue to participte in phase 2 cardiac rehab for exercise, nutrtion and lifestyle modifications            Core Components/Risk Factors/Patient Goals at Discharge (Final Review):   Goals and Risk Factor Review - 10/19/20 1439      Core Components/Risk Factors/Patient Goals Review   Personal Goals Review Weight Management/Obesity;Lipids;Hypertension;Stress;Tobacco Cessation    Review Patient's last exercise session was on 09/29/20. Awaiting return to exercise    Expected Outcomes Merion will continue to participte in phase 2 cardiac rehab for exercise, nutrtion and lifestyle modifications           ITP Comments:  ITP Comments    Row Name 09/15/20 1234 09/20/20 1408 10/19/20 1437       ITP Comments Dr Fransico Him MD, Medical Director 30 Day ITP REview. Patient started exercise at cardiac rehab on 09/20/20 Tolerated well 30 Day ITP REview. Maranatha has not returned to exercise. Tanijah's last day of exercise was on 01/90/22            Comments: See ITP comments.Barnet Pall, RN,BSN 10/19/2020 2:41 PM

## 2020-10-20 ENCOUNTER — Encounter (HOSPITAL_COMMUNITY)
Admission: RE | Admit: 2020-10-20 | Discharge: 2020-10-20 | Disposition: A | Discharge status: Home / Self Care | Payer: Medicare Other | Source: Ambulatory Visit | Attending: Internal Medicine | Admitting: Internal Medicine

## 2020-10-20 ENCOUNTER — Other Ambulatory Visit: Payer: Self-pay

## 2020-10-20 ENCOUNTER — Ambulatory Visit: Payer: Medicare Other | Admitting: Gastroenterology

## 2020-10-20 DIAGNOSIS — Z955 Presence of coronary angioplasty implant and graft: Secondary | ICD-10-CM | POA: Diagnosis not present

## 2020-10-20 DIAGNOSIS — I214 Non-ST elevation (NSTEMI) myocardial infarction: Secondary | ICD-10-CM | POA: Insufficient documentation

## 2020-10-20 NOTE — Progress Notes (Signed)
Wendy [name] returned to exercise at cardiac rehab today after being absent from cardiac rehab  Since 09/29/20 medications changes noted. Wendy [name]'s weight is up 2.2 kg  From her last exercise session. Oxygen saturation 98% on room air. Upon assessment lung fields clear upon ascultation. Trace right  ankle edema noted. Wendy [name] has a prn furosemide that she will take. Will continue to monitor the patient throughout  the program.Wendy [name] Wendy Maxon, RN,BSN 10/20/2020 2:57 PM

## 2020-10-22 ENCOUNTER — Encounter (HOSPITAL_COMMUNITY)
Admission: RE | Admit: 2020-10-22 | Discharge: 2020-10-22 | Disposition: A | Discharge status: Home / Self Care | Payer: Medicare Other | Source: Ambulatory Visit | Attending: Internal Medicine | Admitting: Internal Medicine

## 2020-10-22 ENCOUNTER — Other Ambulatory Visit: Payer: Self-pay

## 2020-10-22 DIAGNOSIS — I214 Non-ST elevation (NSTEMI) myocardial infarction: Secondary | ICD-10-CM

## 2020-10-22 DIAGNOSIS — Z955 Presence of coronary angioplasty implant and graft: Secondary | ICD-10-CM

## 2020-10-25 ENCOUNTER — Other Ambulatory Visit: Payer: Self-pay

## 2020-10-25 ENCOUNTER — Encounter (HOSPITAL_COMMUNITY)
Admission: RE | Admit: 2020-10-25 | Discharge: 2020-10-25 | Disposition: A | Discharge status: Home / Self Care | Payer: Medicare Other | Source: Ambulatory Visit | Attending: Internal Medicine | Admitting: Internal Medicine

## 2020-10-25 DIAGNOSIS — I214 Non-ST elevation (NSTEMI) myocardial infarction: Secondary | ICD-10-CM | POA: Diagnosis not present

## 2020-10-25 DIAGNOSIS — Z955 Presence of coronary angioplasty implant and graft: Secondary | ICD-10-CM

## 2020-10-27 ENCOUNTER — Encounter (HOSPITAL_COMMUNITY)
Admission: RE | Admit: 2020-10-27 | Discharge: 2020-10-27 | Disposition: A | Discharge status: Home / Self Care | Payer: Medicare Other | Source: Ambulatory Visit | Attending: Internal Medicine | Admitting: Internal Medicine

## 2020-10-27 ENCOUNTER — Other Ambulatory Visit: Payer: Self-pay

## 2020-10-27 DIAGNOSIS — I214 Non-ST elevation (NSTEMI) myocardial infarction: Secondary | ICD-10-CM

## 2020-10-27 DIAGNOSIS — Z955 Presence of coronary angioplasty implant and graft: Secondary | ICD-10-CM | POA: Diagnosis not present

## 2020-10-27 NOTE — Progress Notes (Signed)
Wendy [name] has gained a total of 3.1 kg since starting exercise at cardiac rehab on 09/20/20. Wendy [name] has not come to exercise on a consistent basis and is not exercising on the non cardiac rehab days as she says she is scared. Gentle encouragement given to the patient. Wendy [name] continues to abstain from smoking. Upon assessment, lung fields clear upon ascultation. Sa02 98% on room air. Trace ankle edema no leg edema noted.Wendy [name] denies shortness of breath. Will forward exercise flow sheets from cardiac rehab to Dr Gasper Sells office to review. Wendy [name] is doing well with exercise.Barnet Pall, RN,BSN 10/27/2020 4:27 PM

## 2020-10-29 ENCOUNTER — Other Ambulatory Visit: Payer: Self-pay

## 2020-10-29 ENCOUNTER — Other Ambulatory Visit: Payer: Medicare Other

## 2020-10-29 ENCOUNTER — Encounter (HOSPITAL_COMMUNITY)
Admission: RE | Admit: 2020-10-29 | Discharge: 2020-10-29 | Disposition: A | Discharge status: Home / Self Care | Payer: Medicare Other | Source: Ambulatory Visit | Attending: Internal Medicine | Admitting: Internal Medicine

## 2020-10-29 DIAGNOSIS — I214 Non-ST elevation (NSTEMI) myocardial infarction: Secondary | ICD-10-CM | POA: Diagnosis not present

## 2020-10-29 DIAGNOSIS — Z955 Presence of coronary angioplasty implant and graft: Secondary | ICD-10-CM

## 2020-11-01 ENCOUNTER — Encounter (HOSPITAL_COMMUNITY): Payer: Medicare Other

## 2020-11-01 ENCOUNTER — Encounter (HOSPITAL_COMMUNITY): Payer: Self-pay

## 2020-11-01 ENCOUNTER — Emergency Department (HOSPITAL_COMMUNITY)
Admission: EM | Admit: 2020-11-01 | Discharge: 2020-11-02 | Disposition: A | Discharge status: Home / Self Care | Payer: Medicare Other | Attending: Emergency Medicine | Admitting: Emergency Medicine

## 2020-11-01 ENCOUNTER — Emergency Department (HOSPITAL_COMMUNITY): Payer: Medicare Other

## 2020-11-01 ENCOUNTER — Other Ambulatory Visit: Payer: Self-pay

## 2020-11-01 DIAGNOSIS — Z87891 Personal history of nicotine dependence: Secondary | ICD-10-CM | POA: Diagnosis not present

## 2020-11-01 DIAGNOSIS — K5900 Constipation, unspecified: Secondary | ICD-10-CM | POA: Insufficient documentation

## 2020-11-01 DIAGNOSIS — Z79899 Other long term (current) drug therapy: Secondary | ICD-10-CM | POA: Insufficient documentation

## 2020-11-01 DIAGNOSIS — Z7982 Long term (current) use of aspirin: Secondary | ICD-10-CM | POA: Diagnosis not present

## 2020-11-01 DIAGNOSIS — I2510 Atherosclerotic heart disease of native coronary artery without angina pectoris: Secondary | ICD-10-CM | POA: Diagnosis not present

## 2020-11-01 DIAGNOSIS — I10 Essential (primary) hypertension: Secondary | ICD-10-CM | POA: Insufficient documentation

## 2020-11-01 DIAGNOSIS — K649 Unspecified hemorrhoids: Secondary | ICD-10-CM | POA: Diagnosis not present

## 2020-11-01 DIAGNOSIS — R0602 Shortness of breath: Secondary | ICD-10-CM | POA: Diagnosis not present

## 2020-11-01 DIAGNOSIS — R21 Rash and other nonspecific skin eruption: Secondary | ICD-10-CM | POA: Diagnosis not present

## 2020-11-01 DIAGNOSIS — Z91040 Latex allergy status: Secondary | ICD-10-CM | POA: Insufficient documentation

## 2020-11-01 DIAGNOSIS — R635 Abnormal weight gain: Secondary | ICD-10-CM | POA: Insufficient documentation

## 2020-11-01 DIAGNOSIS — R079 Chest pain, unspecified: Secondary | ICD-10-CM | POA: Diagnosis not present

## 2020-11-01 DIAGNOSIS — Z91010 Allergy to peanuts: Secondary | ICD-10-CM | POA: Insufficient documentation

## 2020-11-01 DIAGNOSIS — J45909 Unspecified asthma, uncomplicated: Secondary | ICD-10-CM | POA: Insufficient documentation

## 2020-11-01 LAB — CBC
HCT: 37.1 % (ref 36.0–46.0)
Hemoglobin: 11.3 g/dL — ABNORMAL LOW (ref 12.0–15.0)
MCH: 27.9 pg (ref 26.0–34.0)
MCHC: 30.5 g/dL (ref 30.0–36.0)
MCV: 91.6 fL (ref 80.0–100.0)
Platelets: 215 10*3/uL (ref 150–400)
RBC: 4.05 MIL/uL (ref 3.87–5.11)
RDW: 12.9 % (ref 11.5–15.5)
WBC: 5.1 10*3/uL (ref 4.0–10.5)
nRBC: 0 % (ref 0.0–0.2)

## 2020-11-01 LAB — I-STAT BETA HCG BLOOD, ED (MC, WL, AP ONLY): I-stat hCG, quantitative: <5 m[IU]/mL (ref <5)

## 2020-11-01 NOTE — ED Triage Notes (Signed)
Pt reports that began she began to have SOB today, noticed swelling in her feet and face and took a fluid pill, began to have CP. Pt also reports that she notice blood in her stool on Sat

## 2020-11-01 NOTE — Progress Notes (Signed)
Reviewed home exercise Rx with patient today. Pt will begin walking at home 2-4 x/week for 30 minutes. We discussed the importance of warm-up, cool-down and stretching. We reviewed the THRR of 66-133 and keeping the RPE between 11-13. Hydration was encouraged before, during and after exercise. Reviewed weather parameters for temperature and humidity for safe exercise outdoors. Reviewed S/S that would require patient to terminate exercise and when to call 911 vs MD. Also, reviewed use of NTG and that pat should carry a cell phone if exercising outdoors. Pt verbalized understanding of education and was provided a copy of the home exercise RX.  Lesly Rubenstein MS, ACSM-EP-C, CCRP

## 2020-11-02 ENCOUNTER — Encounter (HOSPITAL_COMMUNITY): Payer: Self-pay | Admitting: Emergency Medicine

## 2020-11-02 ENCOUNTER — Other Ambulatory Visit: Payer: Self-pay

## 2020-11-02 LAB — BASIC METABOLIC PANEL
Anion gap: 8 (ref 5–15)
BUN: 7 mg/dL (ref 6–20)
CO2: 25 mmol/L (ref 22–32)
Calcium: 9.1 mg/dL (ref 8.9–10.3)
Chloride: 107 mmol/L (ref 98–111)
Creatinine, Ser: 0.78 mg/dL (ref 0.44–1.00)
GFR, Estimated: >60 mL/min (ref >60)
Glucose, Bld: 102 mg/dL — ABNORMAL HIGH (ref 70–99)
Potassium: 3.4 mmol/L — ABNORMAL LOW (ref 3.5–5.1)
Sodium: 140 mmol/L (ref 135–145)

## 2020-11-02 LAB — TROPONIN I (HIGH SENSITIVITY)
Troponin I (High Sensitivity): 5 ng/L (ref <18)
Troponin I (High Sensitivity): 5 ng/L (ref <18)

## 2020-11-02 LAB — BRAIN NATRIURETIC PEPTIDE: B Natriuretic Peptide: 82.1 pg/mL (ref 0.0–100.0)

## 2020-11-02 LAB — POC OCCULT BLOOD, ED: Fecal Occult Bld: NEGATIVE

## 2020-11-02 NOTE — ED Provider Notes (Signed)
[facility] EMERGENCY DEPARTMENT Provider Note   CSN: [id number] Arrival date & time: 11/01/20  2202     History Chief Complaint  Patient presents with  . Shortness of Breath    Wendy [name] is a 55 y.o. female.  The history is provided by the patient.  Shortness of Breath Severity:  Moderate Onset quality:  Gradual Duration:  1 day Timing:  Constant Progression:  Unchanged Chronicity:  Recurrent Context: not URI   Relieved by:  Nothing Worsened by:  Nothing Ineffective treatments: lasix  Associated symptoms: chest pain   Associated symptoms: no claudication, no cough, no diaphoresis, no neck pain, no sore throat, no sputum production and no vomiting   Associated symptoms comment:  Constipation with associated blood on the toilet tissue with one BM.  Patient also reports weight gain Risk factors: no family hx of DVT   Patient with CAD presents with ongoing CP, SOB and several days of weight gain along with a week of constipation and one BM with straining that had blood on the toilet tissue.      Past Medical History:  Diagnosis Date  . Anxiety   . Asthma    daily and prn inhalers  . Cervical disc disease    decreased range of motion  . Chronic pain    shoulders, neck, lower back  . Coronary artery disease   . Deformity, hand    left  . Depression   . Endometriosis    Lupron injection Q 3 mos.  . Fluid retention in legs   . Hyperlipidemia   . Hypertension   . Injury, brachial plexus    left  . Multiple allergies    takes allergy shots  . PONV (postoperative nausea and vomiting)   . 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 Wears partial dentures    upper partial    Patient Active Problem List   Diagnosis Date Noted  . PVC (premature ventricular contraction) 10/14/2020  . Coronary artery disease involving native coronary artery of native heart without angina pectoris 07/30/2020  . Essential hypertension  07/30/2020  . Hyperlipidemia 07/30/2020  . Gastroesophageal reflux disease without esophagitis 07/30/2020  . Nonrheumatic mitral valve regurgitation 07/30/2020  . Prolonged Q-T interval on ECG 07/30/2020  . NSTEMI (non-ST elevated myocardial infarction) ([place]) 07/15/2020  . BV (bacterial vaginosis) 03/06/2013  . ANXIETY 11/05/2007  . DEPRESSION 11/05/2007  . NAUSEA, CHRONIC 11/05/2007  . FIBROIDS, UTERUS 03/26/2007  . MIGRAINE, COMMON 03/26/2007  . OVARIAN CYST 03/26/2007  . 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 [place] DISEASE, LUMBOSACRAL SPINE 03/26/2007  . 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    Past Surgical History:  Procedure Laterality Date  . ANKLE RECONSTRUCTION  02/01/2012   Procedure: RECONSTRUCTION ANKLE;  Surgeon: Wylene Simmer, MD;  Location: [place];  Service: Orthopedics;  Laterality: Right;  Excision of right calcaneonavicular coalition with autologus fat graft interposition  . BRACHIAL PLEXUS EXPLORATION    . CARDIAC CATHETERIZATION    . CORONARY STENT INTERVENTION N/A 07/15/2020   Procedure: CORONARY STENT INTERVENTION;  Surgeon: Jettie Booze, MD;  Location: [facility];  Service: Cardiovascular;  Laterality: N/A;  . DIAGNOSTIC LAPAROSCOPY  10/02/2008   peritoneal bx.  Marland Kitchen LEFT HEART CATH AND CORONARY ANGIOGRAPHY N/A 07/15/2020   Procedure: LEFT HEART CATH AND CORONARY ANGIOGRAPHY;  Surgeon: Jettie Booze, MD;  Location: [facility];  Service: Cardiovascular;  Laterality: N/A;  . MULTIPLE TOOTH  EXTRACTIONS     upper teeth and wisdom teeth  . OVARIAN CYST REMOVAL  2000  . RIB RESECTION     left - cervical rib removal  . SHOULDER ARTHROSCOPY W/ ROTATOR CUFF REPAIR  07/2011   right  . SHOULDER SURGERY     left  . TOOTH EXTRACTION     x 1     OB History    Gravida  0   Para  0   Term  0   Preterm  0   AB  0   Living  0     SAB  0   IAB  0   Ectopic  0   Multiple  0   Live Births               Family History  Problem Relation Age of Onset  . Cancer Mother   . Dementia Father   . Stroke Father     Social History   Tobacco Use  . Smoking status: Former Smoker    Packs/day: 1.00    Years: 20.00    Pack years: 20.00    Types: Cigarettes    Quit date: 07/14/2020    Years since quitting: 0.3  . Smokeless tobacco: Never Used  Vaping Use  . Vaping Use: Never used  Substance Use Topics  . Alcohol use: No    Alcohol/week: 0.0 standard drinks    Comment: occasional  . Drug use: No    Home Medications Prior to Admission medications   Medication Sig Start Date End Date Taking? Authorizing Provider  acetaminophen (TYLENOL) 325 MG tablet Take 2 tablets (650 mg total) by mouth every 6 (six) hours as needed. 10/01/20   Mesner, Corene Cornea, MD  albuterol (PROVENTIL HFA;VENTOLIN HFA) 108 (90 Base) MCG/ACT inhaler Inhale 2 puffs into the lungs every 4 (four) hours as needed for wheezing or shortness of breath (cough). 11/20/16   Street, Golden, PA-C  aspirin EC 81 MG EC tablet Take 1 tablet (81 mg total) by mouth daily. Swallow whole. 07/17/20   Bhagat, Crista Luria, PA  beclomethasone (QVAR) 80 MCG/ACT inhaler Inhale 1 puff into the lungs 2 (two) times daily as needed (sob/wheezing).     [provider]  clopidogrel (PLAVIX) 75 MG tablet Take 1 tablet (75 mg total) by mouth daily. 09/21/20   Werner Lean, MD  cyclobenzaprine (FLEXERIL) 10 MG tablet Take 1 tablet (10 mg total) by mouth 2 (two) times daily as needed for muscle spasms. 10/01/20   Mesner, Corene Cornea, MD  diltiazem (CARDIZEM CD) 120 MG 24 hr capsule Take 1 capsule (120 mg total) by mouth daily. 10/14/20   Chandrasekhar, Lyda Kalata A, MD  EPINEPHrine (EPIPEN JR) 0.15 MG/0.3ML injection Inject 0.15 mg into the muscle daily as needed for anaphylaxis.     [provider]  ergocalciferol (VITAMIN D2) 50000 UNITS capsule Take 50,000 Units by mouth every Sunday. Sundays    [provider]   furosemide (LASIX) 20 MG tablet TAKE 1/2 TABLET(10 MG) BY MOUTH DAILY AS NEEDED FOR FLUID RETENTION OR SWELLING Patient taking differently: 10 mg daily as needed for fluid or edema. 08/25/20   Chandrasekhar, Lyda Kalata A, MD  gabapentin (NEURONTIN) 600 MG tablet Take 600 mg by mouth at bedtime.    [provider]  hydrOXYzine (ATARAX/VISTARIL) 25 MG tablet Take 25 mg by mouth 3 (three) times daily as needed for anxiety or itching.  10/09/19   [provider]  isosorbide mononitrate (IMDUR) 30 MG 24 hr  tablet Take 1 tablet (30 mg total) by mouth daily. 10/08/20   Loni Beckwith, PA-C  metoprolol tartrate (LOPRESSOR) 25 MG tablet Take 1 tablet (25 mg total) by mouth 2 (two) times daily. 09/27/20   Chandrasekhar, Terisa Starr, MD  nitroGLYCERIN (NITROSTAT) 0.4 MG SL tablet Place 1 tablet (0.4 mg total) under the tongue every 5 (five) minutes x 3 doses as needed for chest pain. 07/16/20   Bhagat, Crista Luria, PA  polyethylene glycol (MIRALAX) 17 g packet Take 17 g by mouth daily. 07/24/20   Scot Jun, FNP  potassium chloride SA (KLOR-CON) 20 MEQ tablet Take 20 mEq by mouth daily.    [provider]  rosuvastatin (CRESTOR) 40 MG tablet Take 1 tablet (40 mg total) by mouth daily. 07/16/20   Leanor Kail, PA    Allergies    Pfizer-biontech covid-19 vacc [covid-19 mrna vacc (moderna)], Chocolate, Ivp dye [iodinated diagnostic agents], Latex, Shellfish allergy, Fish-derived products, Peanut-containing drug products, and Soap  Review of Systems   Review of Systems  Constitutional: Negative for diaphoresis.  HENT: Negative for sore throat.   Eyes: Negative for visual disturbance.  Respiratory: Positive for shortness of breath. Negative for cough and sputum production.   Cardiovascular: Positive for chest pain. Negative for palpitations and claudication.  Gastrointestinal: Negative for blood in stool, nausea and vomiting.  Genitourinary: Negative for difficulty  urinating.  Musculoskeletal: Negative for neck pain.  Neurological: Negative for dizziness.  Psychiatric/Behavioral: Negative for agitation.  All other systems reviewed and are negative.   Physical Exam Updated Vital Signs BP 126/61 (BP Location: Right Arm)   Pulse (!) 57   Temp 97.7 F (36.5 C) (Oral)   Resp 18   Ht 4\' 11"  (1.499 m)   Wt 86 kg   LMP 07/12/2016 (Approximate) Comment: 2-3 day cycle  SpO2 100%   BMI 38.29 kg/m   Physical Exam Vitals and nursing note reviewed. Exam conducted with a chaperone present.  Constitutional:      General: She is not in acute distress.    Appearance: Normal appearance.  HENT:     Head: Normocephalic and atraumatic.     Comments: No facial swelling    Nose: Nose normal.     Mouth/Throat:     Mouth: Mucous membranes are moist.     Pharynx: Oropharynx is clear.  Eyes:     Conjunctiva/sclera: Conjunctivae normal.     Pupils: Pupils are equal, round, and reactive to light.  Cardiovascular:     Rate and Rhythm: Normal rate and regular rhythm.     Pulses: Normal pulses.     Heart sounds: Normal heart sounds.  Pulmonary:     Effort: Pulmonary effort is normal.     Breath sounds: Normal breath sounds.  Abdominal:     General: Abdomen is flat. Bowel sounds are normal.     Palpations: Abdomen is soft.     Tenderness: There is no abdominal tenderness. There is no guarding.  Genitourinary:    Rectum: Guaiac result negative.     Comments: Hemorrhoids  Musculoskeletal:        General: Normal range of motion.     Cervical back: Normal range of motion and neck supple.     Right lower leg: No edema.     Left lower leg: No edema.  Skin:    General: Skin is warm and dry.     Capillary Refill: Capillary refill takes less than 2 seconds.  Neurological:     General:  No focal deficit present.     Mental Status: She is alert and oriented to person, place, and time.     Deep Tendon Reflexes: Reflexes normal.  Psychiatric:        Mood and  Affect: Mood is anxious.        Thought Content: Thought content normal.     ED Results / Procedures / Treatments   Labs (all labs ordered are listed, but only abnormal results are displayed) Results for orders placed or performed during the hospital encounter of 32/44/01  Basic metabolic panel  Result Value Ref Range   Sodium 140 135 - 145 mmol/L   Potassium 3.4 (L) 3.5 - 5.1 mmol/L   Chloride 107 98 - 111 mmol/L   CO2 25 22 - 32 mmol/L   Glucose, Bld 102 (H) 70 - 99 mg/dL   BUN 7 6 - 20 mg/dL   Creatinine, Ser 0.78 0.44 - 1.00 mg/dL   Calcium 9.1 8.9 - 10.3 mg/dL   GFR, Estimated >60 >60 mL/min   Anion gap 8 5 - 15  CBC  Result Value Ref Range   WBC 5.1 4.0 - 10.5 K/uL   RBC 4.05 3.87 - 5.11 MIL/uL   Hemoglobin 11.3 (L) 12.0 - 15.0 g/dL   HCT 37.1 36.0 - 46.0 %   MCV 91.6 80.0 - 100.0 fL   MCH 27.9 26.0 - 34.0 pg   MCHC 30.5 30.0 - 36.0 g/dL   RDW 12.9 11.5 - 15.5 %   Platelets 215 150 - 400 K/uL   nRBC 0.0 0.0 - 0.2 %  Brain natriuretic peptide  Result Value Ref Range   B Natriuretic Peptide 82.1 0.0 - 100.0 pg/mL  I-Stat beta hCG blood, ED  Result Value Ref Range   I-stat hCG, quantitative <5.0 <5 mIU/mL   Comment 3          Troponin I (High Sensitivity)  Result Value Ref Range   Troponin I (High Sensitivity) 5 <18 ng/L   DG Chest 2 View  Result Date: 11/01/2020 CLINICAL DATA:  55 year old female with chest pain. EXAM: CHEST - 2 VIEW COMPARISON:  Chest radiograph dated 10/08/2020. FINDINGS: No focal consolidation, pleural effusion, pneumothorax. The cardiac silhouette is within limits. No acute osseous pathology. IMPRESSION: No active cardiopulmonary disease. Electronically Signed   By: Anner Crete M.D.   On: 11/01/2020 22:41   DG Chest 2 View  Result Date: 10/08/2020 CLINICAL DATA:  Chest pain EXAM: CHEST - 2 VIEW COMPARISON:  12/12/2019, 07/14/2020 FINDINGS: The heart size and mediastinal contours are within normal limits. Aortic atherosclerosis. Both  lungs are clear. The visualized skeletal structures are unremarkable. IMPRESSION: No active cardiopulmonary disease. Electronically Signed   By: Donavan Foil M.D.   On: 10/08/2020 17:30   LONG TERM MONITOR (3-14 DAYS)  Result Date: 10/14/2020  Patient had a minimum heart rate of 49 bpm, maximum heart rate of 174 bpm, and average heart rate of 76 bpm.  Predominant underlying rhythm was sinus rhythm.  One run of supraventricular tachycardia occurred lasting 4 beats at longest with a max rate of 174 bpm at fastest.  Isolated PACs were rare (<1.0%), with rare couplets present.  Isolated PVCs were rare (<1.0%).  No evidence of complete heart block .  Triggered and diary events associated with sinus rhythm.  No malignant arrhythmias.   EKG EKG Interpretation  Date/Time:  Monday November 01 2020 22:17:22 EST Ventricular Rate:  67 PR Interval:  174 QRS Duration: 80 QT Interval:  426 QTC Calculation: 450 R Axis:   -3 Text Interpretation: Normal sinus rhythm Confirmed by Randal Buba, Jencarlo Bonadonna ([id number]) on 11/02/2020 2:10:39 AM   Radiology DG Chest 2 View  Result Date: 11/01/2020 CLINICAL DATA:  55 year old female with chest pain. EXAM: CHEST - 2 VIEW COMPARISON:  Chest radiograph dated 10/08/2020. FINDINGS: No focal consolidation, pleural effusion, pneumothorax. The cardiac silhouette is within limits. No acute osseous pathology. IMPRESSION: No active cardiopulmonary disease. Electronically Signed   By: Anner Crete M.D.   On: 11/01/2020 22:41    Procedures Procedures   Medications Ordered in ED Medications - No data to display  ED Course  I have reviewed the triage vital signs and the nursing notes.  Pertinent labs & imaging results that were available during my care of the patient were reviewed by me and considered in my medical decision making (see chart for details).    Patient reports swelling of the face hands and feet but they are definitively not swollen.  No CHF on CXR BNP is  not elevated.  I suspect true weight gain, not edema.  Hemoccult is negative but history and exam are consistent with hemorrhoidal bleed.  I suspect the majority of this is secondary to anxiety.  I have recommended close follow up with the patient's PMD and cardiologist to address issues.  Patient has ruled out for MI in the ED.  EKG is normal.  Patient is stable for discharge with close follow up.    Wendy [name] was evaluated in Emergency Department on 11/02/2020 for the symptoms described in the history of present illness. She was evaluated in the context of the global COVID-19 pandemic, which necessitated consideration that the patient might be at risk for infection with the SARS-CoV-2 virus that causes COVID-19. Institutional protocols and algorithms that pertain to the evaluation of patients at risk for COVID-19 are in a state of rapid change based on information released by regulatory bodies including the CDC and federal and state organizations. These policies and algorithms were followed during the patient's care in the ED.  Final Clinical Impression(s) / ED Diagnoses Final diagnoses:  Hemorrhoids, unspecified hemorrhoid type  Weight gain   Return for intractable cough, coughing up blood, fevers >100.4 unrelieved by medication, shortness of breath, intractable vomiting, chest pain, shortness of breath, weakness, numbness, changes in speech, facial asymmetry, abdominal pain, passing out, Inability to tolerate liquids or food, cough, altered mental status or any concerns. No signs of systemic illness or infection. The patient is nontoxic-appearing on exam and vital signs are within normal limits.  I have reviewed the triage vital signs and the nursing notes. Pertinent labs & imaging results that were available during my care of the patient were reviewed by me and considered in my medical decision making (see chart for details). After history, exam, and medical workup I feel the patient has been  appropriately medically screened and is safe for discharge home. Pertinent diagnoses were discussed with the patient. Patient was given return precautions.   Alisandra Son, MD 11/02/20 [phone]

## 2020-11-02 NOTE — Telephone Encounter (Signed)
Spoke with patient regarding swelling.  I reassured her that testing completed during ED visit was not concerning at this time.  She reports that she has noticed a 4-5lbs weight gain in the past couple of days.  She has taken Lasix 10mg  by mouth for the past 3 days.  She reports X1 occurrence of blood in stool on Friday 10/29/20.  No further bleeding noted.  Dr. Gasper Sells recommends that she weigh herself daily for a week and notify office if she notices a significant weight gain. If needed he will adjust her lasix if she has significant weight gain. He also requested that she call the office if she notices any blood in her stool. Patient agreeable to this plan no further questions asked.

## 2020-11-03 ENCOUNTER — Encounter (HOSPITAL_COMMUNITY): Payer: Medicare Other

## 2020-11-05 ENCOUNTER — Ambulatory Visit: Payer: Medicare Other | Admitting: Internal Medicine

## 2020-11-05 ENCOUNTER — Encounter (HOSPITAL_COMMUNITY): Payer: Medicare Other

## 2020-11-08 ENCOUNTER — Other Ambulatory Visit: Payer: Self-pay | Admitting: Internal Medicine

## 2020-11-08 ENCOUNTER — Encounter (HOSPITAL_COMMUNITY): Payer: Medicare Other

## 2020-11-08 MED ORDER — ISOSORBIDE MONONITRATE ER 30 MG PO TB24: 30.0000 mg | ORAL_TABLET | Freq: Every day | ORAL | 3 refills | Status: DC

## 2020-11-08 MED ORDER — FUROSEMIDE 20 MG PO TABS: ORAL_TABLET | ORAL | 3 refills | Status: DC

## 2020-11-08 MED FILL — FUROSEMIDE 20 MG TABS: 20 | 90 days supply | Qty: 45 | Fill #0

## 2020-11-08 MED FILL — ISOSORBIDE MN ER 30 MG TAB: 30 | 90 days supply | Qty: 90 | Fill #0

## 2020-11-09 ENCOUNTER — Other Ambulatory Visit: Payer: Self-pay | Admitting: Physician Assistant

## 2020-11-09 ENCOUNTER — Encounter (HOSPITAL_COMMUNITY): Payer: Self-pay | Admitting: *Deleted

## 2020-11-09 DIAGNOSIS — I252 Old myocardial infarction: Secondary | ICD-10-CM

## 2020-11-09 DIAGNOSIS — I214 Non-ST elevation (NSTEMI) myocardial infarction: Secondary | ICD-10-CM

## 2020-11-09 DIAGNOSIS — Z955 Presence of coronary angioplasty implant and graft: Secondary | ICD-10-CM

## 2020-11-09 DIAGNOSIS — Z87891 Personal history of nicotine dependence: Secondary | ICD-10-CM

## 2020-11-09 NOTE — Progress Notes (Signed)
Cardiac Individual Treatment Plan  Patient Details  Name: Wendy [name] MRN: [id number] Date of Birth: [birth year] Referring Provider:   Flowsheet Row CARDIAC REHAB PHASE II ORIENTATION from 09/14/2020 in [place]  Referring Provider Dr. Rudean Haskell, MD      Initial Encounter Date:  [place] PHASE II ORIENTATION from 09/14/2020 in [place]  Date 09/14/20      Visit Diagnosis: NSTEMI (non-ST elevated myocardial infarction) [facility]) 07/14/20  07/15/20 S/P DES RCA, OM1  Patient's Home Medications on Admission:  Current Outpatient Medications:  .  acetaminophen (TYLENOL) 325 MG tablet, Take 2 tablets (650 mg total) by mouth every 6 (six) hours as needed. (Patient taking differently: Take 650 mg by mouth every 6 (six) hours as needed for mild pain or headache.), Disp: 30 tablet, Rfl: 0 .  albuterol (PROVENTIL HFA;VENTOLIN HFA) 108 (90 Base) MCG/ACT inhaler, Inhale 2 puffs into the lungs every 4 (four) hours as needed for wheezing or shortness of breath (cough)., Disp: 1 Inhaler, Rfl: 0 .  aspirin EC 81 MG EC tablet, Take 1 tablet (81 mg total) by mouth daily. Swallow whole., Disp: 30 tablet, Rfl: 11 .  beclomethasone (QVAR) 80 MCG/ACT inhaler, Inhale 1 puff into the lungs 2 (two) times daily as needed (sob/wheezing). , Disp: , Rfl:  .  clopidogrel (PLAVIX) 75 MG tablet, Take 1 tablet (75 mg total) by mouth daily., Disp: 90 tablet, Rfl: 3 .  cyclobenzaprine (FLEXERIL) 10 MG tablet, Take 1 tablet (10 mg total) by mouth 2 (two) times daily as needed for muscle spasms., Disp: 20 tablet, Rfl: 0 .  diltiazem (CARDIZEM CD) 120 MG 24 hr capsule, Take 1 capsule (120 mg total) by mouth daily., Disp: 90 capsule, Rfl: 3 .  EPINEPHrine (EPIPEN JR) 0.15 MG/0.3ML injection, Inject 0.15 mg into the muscle daily as needed for anaphylaxis. , Disp: , Rfl:  .  ergocalciferol (VITAMIN D2) 50000 UNITS capsule, Take 50,000  Units by mouth every Sunday. Sundays, Disp: , Rfl:  .  furosemide (LASIX) 20 MG tablet, TAKE 1/2 TABLET(10 MG) BY MOUTH DAILY AS NEEDED FOR FLUID RETENTION OR SWELLING, Disp: 45 tablet, Rfl: 3 .  gabapentin (NEURONTIN) 600 MG tablet, Take 600 mg by mouth at bedtime., Disp: , Rfl:  .  hydrOXYzine (ATARAX/VISTARIL) 25 MG tablet, Take 25 mg by mouth 3 (three) times daily as needed for anxiety or itching. , Disp: , Rfl:  .  isosorbide mononitrate (IMDUR) 30 MG 24 hr tablet, Take 1 tablet (30 mg total) by mouth daily., Disp: 90 tablet, Rfl: 3 .  metoprolol tartrate (LOPRESSOR) 25 MG tablet, Take 1 tablet (25 mg total) by mouth 2 (two) times daily., Disp: 180 tablet, Rfl: 3 .  nitroGLYCERIN (NITROSTAT) 0.4 MG SL tablet, Place 1 tablet (0.4 mg total) under the tongue every 5 (five) minutes x 3 doses as needed for chest pain., Disp: 25 tablet, Rfl: 12 .  polyethylene glycol (MIRALAX) 17 g packet, Take 17 g by mouth daily. (Patient taking differently: Take 17 g by mouth every other day.), Disp: 14 each, Rfl: 0 .  potassium chloride SA (KLOR-CON) 20 MEQ tablet, Take 20 mEq by mouth daily., Disp: , Rfl:  .  rosuvastatin (CRESTOR) 40 MG tablet, Take 1 tablet (40 mg total) by mouth daily., Disp: 30 tablet, Rfl: 6 .  senna (SENOKOT) 8.6 MG TABS tablet, Take 1 tablet by mouth every other day., Disp: , Rfl:   Past Medical History:  Past Medical History:  Diagnosis Date  . Anxiety   . Asthma    daily and prn inhalers  . Cervical disc disease    decreased range of motion  . Chronic pain    shoulders, neck, lower back  . Coronary artery disease   . Deformity, hand    left  . Depression   . Endometriosis    Lupron injection Q 3 mos.  . Fluid retention in legs   . Hyperlipidemia   . Hypertension   . Injury, brachial plexus    left  . Multiple allergies    takes allergy shots  . PONV (postoperative nausea and vomiting)   . 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 Wears partial  dentures    upper partial    Tobacco Use: Social History   Tobacco Use  Smoking Status Former Smoker  . Packs/day: 1.00  . Years: 20.00  . Pack years: 20.00  . Types: Cigarettes  . Quit date: 07/14/2020  . Years since quitting: 0.3  Smokeless Tobacco Never Used    Labs: Recent Review Scientist, physiological    Labs for ITP Cardiac and Pulmonary Rehab Latest Ref Rng & Units 04/13/2008 07/15/2020 09/21/2020   Cholestrol 100 - 199 mg/dL - 248(H) 152   LDLCALC 0 - 99 mg/dL - 159(H) 74   HDL >39 mg/dL - 80 67   Trlycerides 0 - 149 mg/dL - 46 49   Hemoglobin A1c 4.8 - 5.6 % - 5.0 -   TCO2 - 19 - -      Capillary Blood Glucose: No results found for: GLUCAP   Exercise Target Goals: Exercise Program Goal: Individual exercise prescription set using results from initial 6 min walk test and THRR while considering  patient's activity barriers and safety.   Exercise Prescription Goal: Starting with aerobic activity 30 plus minutes a day, 3 days per week for initial exercise prescription. Provide home exercise prescription and guidelines that participant acknowledges understanding prior to discharge.  Activity Barriers & Risk Stratification:  Activity Barriers & Cardiac Risk Stratification - 09/14/20 1510      Activity Barriers & Cardiac Risk Stratification   Activity Barriers Arthritis;Back Problems;Neck/Spine Problems;Joint Problems;Deconditioning;Shortness of Breath    Cardiac Risk Stratification High           6 Minute Walk:  6 Minute Walk    Row Name 09/14/20 1506         6 Minute Walk   Distance 800 feet     Walk Time 6 minutes     # of Rest Breaks 1  5:03 - 6:00 (57 seconds)     MPH 1.51     METS 2.21     RPE 13     Perceived Dyspnea  2     VO2 Peak 7.73     Symptoms Yes (comment)     Comments Low back pain/bilateral quadriceps pain 4/10 (chronic per pt), SOB, RPD = 2     Resting HR 87 bpm     Resting BP 116/52     Resting Oxygen Saturation  97 %     Exercise Oxygen  Saturation  during 6 min walk 99 %     Max Ex. HR 97 bpm     Max Ex. BP 124/60     2 Minute Post BP 110/68            Oxygen Initial Assessment:   Oxygen Re-Evaluation:   Oxygen Discharge (Final Oxygen Re-Evaluation):  Initial Exercise Prescription:  Initial Exercise Prescription - 09/14/20 1500      Date of Initial Exercise RX and Referring Provider   Date 09/14/20    Referring Provider Dr. Rudean Haskell, MD    Expected Discharge Date 11/12/20      NuStep   Level 1    SPM 75    Minutes 15    METs 1.8      Track   Laps 8    Minutes 15    METs 1.93      Prescription Details   Frequency (times per week) 3    Duration Progress to 30 minutes of continuous aerobic without signs/symptoms of physical distress      Intensity   THRR 40-80% of Max Heartrate 66-133    Ratings of Perceived Exertion 11-13    Perceived Dyspnea 0-4      Progression   Progression Continue progressive overload as per policy without signs/symptoms or physical distress.      Resistance Training   Training Prescription Yes    Weight 2 lbs    Reps 10-15           Perform Capillary Blood Glucose checks as needed.  Exercise Prescription Changes:  Exercise Prescription Changes    Row Name 09/20/20 1400 10/20/20 1030 10/29/20 1451         Response to Exercise   Blood Pressure (Admit) 100/54 100/58 108/62     Blood Pressure (Exercise) 108/70 118/70 114/60     Blood Pressure (Exit) 112/70 110/62 114/58     Heart Rate (Admit) 87 bpm 83 bpm 80 bpm     Heart Rate (Exercise) 93 bpm 100 bpm 101 bpm     Heart Rate (Exit) 83 bpm 65 bpm 72 bpm     Rating of Perceived Exertion (Exercise) 11 13 11      Symptoms None None None     Comments Pt's first day of exercise in the CRP2 program Reviewed METS Reviewed Home exercise Rx and Goals     Duration Progress to 30 minutes of  aerobic without signs/symptoms of physical distress Continue with 30 min of aerobic exercise without signs/symptoms  of physical distress. Continue with 30 min of aerobic exercise without signs/symptoms of physical distress.     Intensity THRR unchanged THRR unchanged THRR unchanged           Progression   Progression Continue to progress workloads to maintain intensity without signs/symptoms of physical distress. Continue to progress workloads to maintain intensity without signs/symptoms of physical distress. Continue to progress workloads to maintain intensity without signs/symptoms of physical distress.     Average METs 1.8 2.4 2.7           Resistance Training   Training Prescription Yes No No     Weight 2 lbs No weights on wednesdays Arrived late not time for weights     Reps 10-15 - -     Time 10 Minutes - -           Interval Training   Interval Training - No No           NuStep   Level 1 2 2      SPM 75 75 85     Minutes 20 15 15      METs 1.3 2 2.5           Track   Laps 8 16 16      Minutes 10 15 15      METs  2.39 2.86 2.86           Home Exercise Plan   Plans to continue exercise at - - Home (comment)     Frequency - - Add 3 additional days to program exercise sessions.     Initial Home Exercises Provided - - 10/29/20            Exercise Comments:  Exercise Comments    Row Name 09/20/20 1445 10/19/20 0859 10/20/20 1030 10/29/20 1500     Exercise Comments Pt's first day of exercise in the CRP2 prpogram. Pt tolerated session well with no compalints. Pt has not attened the CRP2 program since 09/29/2020. Pt has returned to the [place] program. She has been cleared by her MD after an ED visit for chest pain. Pt feels well and is motivated to return to exercise. Reviewqed METS. Reviewed Goals and home exercise Rx. Pt feels ready to begin walking at home. On entry to the program the patient voiced feeling fearful of walking her dogs outdoors. Pt is in the process of building her strength and stamina to walk outdoors and eventually walk her dogs. Pt is progressing toward her golas. Pt  verbalized understanding of the home exercise Rx and was provided a copy.           Exercise Goals and Review:  Exercise Goals    Row Name 09/14/20 1510             Exercise Goals   Increase Physical Activity Yes       Intervention Provide advice, education, support and counseling about physical activity/exercise needs.;Develop an individualized exercise prescription for aerobic and resistive training based on initial evaluation findings, risk stratification, comorbidities and participant's personal goals.       Expected Outcomes Short Term: Attend rehab on a regular basis to increase amount of physical activity.;Long Term: Add in home exercise to make exercise part of routine and to increase amount of physical activity.;Long Term: Exercising regularly at least 3-5 days a week.       Increase Strength and Stamina Yes       Intervention Provide advice, education, support and counseling about physical activity/exercise needs.;Develop an individualized exercise prescription for aerobic and resistive training based on initial evaluation findings, risk stratification, comorbidities and participant's personal goals.       Expected Outcomes Short Term: Increase workloads from initial exercise prescription for resistance, speed, and METs.;Short Term: Perform resistance training exercises routinely during rehab and add in resistance training at home;Long Term: Improve cardiorespiratory fitness, muscular endurance and strength as measured by increased METs and functional capacity (6MWT)       Able to understand and use rate of perceived exertion (RPE) scale Yes       Intervention Provide education and explanation on how to use RPE scale       Expected Outcomes Short Term: Able to use RPE daily in rehab to express subjective intensity level;Long Term:  Able to use RPE to guide intensity level when exercising independently       Knowledge and understanding of Target Heart Rate Range (THRR) Yes        Intervention Provide education and explanation of THRR including how the numbers were predicted and where they are located for reference       Expected Outcomes Short Term: Able to state/look up THRR;Short Term: Able to use daily as guideline for intensity in rehab;Long Term: Able to use THRR to govern intensity when exercising independently  Understanding of Exercise Prescription Yes       Intervention Provide education, explanation, and written materials on patient's individual exercise prescription       Expected Outcomes Short Term: Able to explain program exercise prescription;Long Term: Able to explain home exercise prescription to exercise independently              Exercise Goals Re-Evaluation :  Exercise Goals Re-Evaluation    [place] Name 09/20/20 1443 10/29/20 1445           Exercise Goal Re-Evaluation   Exercise Goals Review Increase Physical Activity;Increase Strength and Stamina;Able to understand and use rate of perceived exertion (RPE) scale;Knowledge and understanding of Target Heart Rate Range (THRR);Understanding of Exercise Prescription Increase Physical Activity;Increase Strength and Stamina;Able to understand and use rate of perceived exertion (RPE) scale;Knowledge and understanding of Target Heart Rate Range (THRR);Understanding of Exercise Prescription      Comments Pt's first day of exercise in the CRP2 program. Pt understands RPE scale, THRR, and Exercise Rx. Reviewed Goals and home exercise Rx. Pt has gained enough confidence to begin walking at home on her own. She was encouraged to walk 2-4x/week for 30 minutes to increase CV fitness and help facilitate weight loss.      Expected Outcomes Will continue to monitor patient and progress exercise workloads as tolerated. Pt will walk at home on her own 2-4 x/week for 30 minutes.              Discharge Exercise Prescription (Final Exercise Prescription Changes):  Exercise Prescription Changes - 10/29/20 1451       Response to Exercise   Blood Pressure (Admit) 108/62    Blood Pressure (Exercise) 114/60    Blood Pressure (Exit) 114/58    Heart Rate (Admit) 80 bpm    Heart Rate (Exercise) 101 bpm    Heart Rate (Exit) 72 bpm    Rating of Perceived Exertion (Exercise) 11    Symptoms None    Comments Reviewed Home exercise Rx and Goals    Duration Continue with 30 min of aerobic exercise without signs/symptoms of physical distress.    Intensity THRR unchanged      Progression   Progression Continue to progress workloads to maintain intensity without signs/symptoms of physical distress.    Average METs 2.7      Resistance Training   Training Prescription No    Weight Arrived late not time for weights      Interval Training   Interval Training No      NuStep   Level 2    SPM 85    Minutes 15    METs 2.5      Track   Laps 16    Minutes 15    METs 2.86      Home Exercise Plan   Plans to continue exercise at Home (comment)    Frequency Add 3 additional days to program exercise sessions.    Initial Home Exercises Provided 10/29/20           Nutrition:  Target Goals: Understanding of nutrition guidelines, daily intake of sodium 1500mg , cholesterol 200mg , calories 30% from fat and 7% or less from saturated fats, daily to have 5 or more servings of fruits and vegetables.  Biometrics:  Pre Biometrics - 09/14/20 1330      Pre Biometrics   Waist Circumference 38.5 inches    Hip Circumference 46.5 inches    Waist to Hip Ratio 0.83 %    Triceps Skinfold  45 mm    % Body Fat 48.3 %    Grip Strength 32 kg    Flexibility 11.25 in    Single Leg Stand 15.56 seconds            Nutrition Therapy Plan and Nutrition Goals:  Nutrition Therapy & Goals - 09/29/20 1452      Nutrition Therapy   Diet TLC    Drug/Food Interactions Statins/Certain Fruits      Personal Nutrition Goals   Nutrition Goal Pt to identify food quantities necessary to achieve weight loss of 6-24 lb at graduation  from cardiac rehab.    Personal Goal #2 Pt to build a healthy plate including vegetables, fruits, whole grains, and low-fat dairy products in a heart healthy meal plan.      Intervention Plan   Intervention Prescribe, educate and counsel regarding individualized specific dietary modifications aiming towards targeted core components such as weight, hypertension, lipid management, diabetes, heart failure and other comorbidities.    Expected Outcomes Long Term Goal: Adherence to prescribed nutrition plan.;Short Term Goal: A plan has been developed with personal nutrition goals set during dietitian appointment.           Nutrition Assessments:  MEDIFICTS Score Key:  ?70 Need to make dietary changes   40-70 Heart Healthy Diet  ? 40 Therapeutic Level Cholesterol Diet  Flowsheet Row CARDIAC REHAB PHASE II EXERCISE from 09/29/2020 in [place]  Picture Your Plate Total Score on Admission 67     Picture Your Plate Scores:  <63 Unhealthy dietary pattern with much room for improvement.  41-50 Dietary pattern unlikely to meet recommendations for good health and room for improvement.  51-60 More healthful dietary pattern, with some room for improvement.   >60 Healthy dietary pattern, although there may be some specific behaviors that could be improved.    Nutrition Goals Re-Evaluation:  Nutrition Goals Re-Evaluation    [place] Name 09/29/20 1455 11/05/20 1206           Goals   Current Weight 185 lb (83.9 kg) 192 lb 3.9 oz (87.2 kg)      Nutrition Goal Pt to identify food quantities necessary to achieve weight loss of 6-24 lb at graduation from cardiac rehab. Pt to identify food quantities necessary to achieve weight loss of 6-24 lb at graduation from cardiac rehab.      Comment - Weight gain 7 lbs since starting rehab             Personal Goal #2 Re-Evaluation   Personal Goal #2 Pt to build a healthy plate including vegetables, fruits, whole grains,  and low-fat dairy products in a heart healthy meal plan. Pt to build a healthy plate including vegetables, fruits, whole grains, and low-fat dairy products in a heart healthy meal plan.             Nutrition Goals Discharge (Final Nutrition Goals Re-Evaluation):  Nutrition Goals Re-Evaluation - 11/05/20 1206      Goals   Current Weight 192 lb 3.9 oz (87.2 kg)    Nutrition Goal Pt to identify food quantities necessary to achieve weight loss of 6-24 lb at graduation from cardiac rehab.    Comment Weight gain 7 lbs since starting rehab      Personal Goal #2 Re-Evaluation   Personal Goal #2 Pt to build a healthy plate including vegetables, fruits, whole grains, and low-fat dairy products in a heart healthy meal plan.  Psychosocial: Target Goals: Acknowledge presence or absence of significant depression and/or stress, maximize coping skills, provide positive support system. Participant is able to verbalize types and ability to use techniques and skills needed for reducing stress and depression.  Initial Review & Psychosocial Screening:  Initial Psych Review & Screening - 11/09/20 1742      Initial Review   Source of Stress Concerns Family;Financial;Unable to perform yard/household activities    Comments Shyra has history of anxiety and depression.  Pt receives couseling virtually and medication at the [facility] in [place].  Wendy [name] is currently out of her medication - Seroquel and plans to contact her provider today for renewal of her prescription. Wendy [name] lives alone and financially this is stressful due to the demand of running a household particular since she has not returned back to work.           Quality of Life Scores:  Quality of Life - 09/14/20 1542      Quality of Life   Select Quality of Life      Quality of Life Scores   Health/Function Pre 13.2 %    Socioeconomic Pre 15.13 %    Psych/Spiritual Pre 19.71 %    Family Pre 9.6 %    GLOBAL Pre 14.43  %          Scores of 19 and below usually indicate a poorer quality of life in these areas.  A difference of  2-3 points is a clinically meaningful difference.  A difference of 2-3 points in the total score of the Quality of Life Index has been associated with significant improvement in overall quality of life, self-image, physical symptoms, and general health in studies assessing change in quality of life.  PHQ-9: Recent Review Flowsheet Data    Depression screen [facility] 2/9 09/15/2020   Decreased Interest 2   Down, Depressed, Hopeless 3   PHQ - 2 Score 5   Altered sleeping 2   Tired, decreased energy 2   Change in appetite 0   Feeling bad or failure about yourself  1   Trouble concentrating 0   Moving slowly or fidgety/restless 0   Suicidal thoughts 0   PHQ-9 Score 10   Difficult doing work/chores Very difficult     Interpretation of Total Score  Total Score Depression Severity:  1-4 = Minimal depression, 5-9 = Mild depression, 10-14 = Moderate depression, 15-19 = Moderately severe depression, 20-27 = Severe depression   Psychosocial Evaluation and Intervention:   Psychosocial Re-Evaluation:  Psychosocial Re-Evaluation    [place] Name 09/20/20 1613 10/19/20 1438 11/09/20 1742         Psychosocial Re-Evaluation   Current issues with Current Stress Concerns;Current Anxiety/Panic;Current Depression Current Stress Concerns;Current Anxiety/Panic;Current Depression Current Stress Concerns;Current Anxiety/Panic;Current Depression     Comments Reviewwed quality of life questionnaire today withb the patient. Will reassess when the patient returns to exercise at cardiac rehab. Wendy [name] has not voiced any increased stressors when in attendance     Expected Outcomes Wendy [name] will have decreased anxiety and depression upon completion of phase 2 cardiacc rehab. Tzivia will have decreased anxiety and depression upon completion of phase 2 cardiacc rehab. Wendy [name] will have decreased anxiety and depression  upon completion of phase 2 cardiacc rehab.     Interventions Encouraged to attend Cardiac Rehabilitation for the exercise;Relaxation education;Stress management education Encouraged to attend Cardiac Rehabilitation for the exercise;Relaxation education;Stress management education Encouraged to attend Cardiac Rehabilitation for the exercise;Relaxation education;Stress management education  Comments Wendy [name] has history of anxiety and depression.  Pt receives couseling virtually and medication at the [facility] in [place].  Arneshia is currently out of her medication - Seroquel and plans to contact her provider today for renewal of her prescription. Traniyah lives alone and financially this is stressful due to the demand of running a household particular since she has not returned back to work. Wendy [name] has history of anxiety and depression.  Pt receives couseling virtually and medication at the [facility] in [place].  Wendy [name] is currently out of her medication - Seroquel and plans to contact her provider today for renewal of her prescription. Wendy [name] lives alone and financially this is stressful due to the demand of running a household particular since she has not returned back to work. -           Initial Review   Source of Stress Concerns Family;Financial;Unable to perform yard/household activities Family;Financial;Unable to perform yard/household activities -            Psychosocial Discharge (Final Psychosocial Re-Evaluation):  Psychosocial Re-Evaluation - 11/09/20 1742      Psychosocial Re-Evaluation   Current issues with Current Stress Concerns;Current Anxiety/Panic;Current Depression    Comments Wendy [name] has not voiced any increased stressors when in attendance    Expected Outcomes Wendy [name] will have decreased anxiety and depression upon completion of phase 2 cardiacc rehab.    Interventions Encouraged to attend Cardiac Rehabilitation for the exercise;Relaxation education;Stress  management education           Vocational Rehabilitation: Provide vocational rehab assistance to qualifying candidates.   Vocational Rehab Evaluation & Intervention:  Vocational Rehab - 09/15/20 1236      Initial Vocational Rehab Evaluation & Intervention   Assessment shows need for Vocational Rehabilitation No   Wendy [name] works part time and is on disability. Wendy [name] does not need vocational rehab at this time.          Education: Education Goals: Education classes will be provided on a weekly basis, covering required topics. Participant will state understanding/return demonstration of topics presented.  Learning Barriers/Preferences:  Learning Barriers/Preferences - 09/14/20 1522      Learning Barriers/Preferences   Learning Barriers Sight   wears reading glasses   Learning Preferences Audio;Computer/Internet;Group Instruction;Individual Instruction;Pictoral;Skilled Demonstration;Verbal Instruction;Video;Written Material           Education Topics: Hypertension, Hypertension Reduction -Define heart disease and high blood pressure. Discus how high blood pressure affects the body and ways to reduce high blood pressure.   Exercise and Your Heart -Discuss why it is important to exercise, the FITT principles of exercise, normal and abnormal responses to exercise, and how to exercise safely.   Angina -Discuss definition of angina, causes of angina, treatment of angina, and how to decrease risk of having angina.   Cardiac Medications -Review what the following cardiac medications are used for, how they affect the body, and side effects that may occur when taking the medications.  Medications include Aspirin, Beta blockers, calcium channel blockers, ACE Inhibitors, angiotensin receptor blockers, diuretics, digoxin, and antihyperlipidemics.   Congestive Heart Failure -Discuss the definition of CHF, how to live with CHF, the signs and symptoms of CHF, and how keep track of  weight and sodium intake.   Heart Disease and Intimacy -Discus the effect sexual activity has on the heart, how changes occur during intimacy as we age, and safety during sexual activity.   Smoking Cessation / COPD -Discuss different methods to quit smoking, the health benefits of  quitting smoking, and the definition of COPD.   Nutrition I: Fats -Discuss the types of cholesterol, what cholesterol does to the heart, and how cholesterol levels can be controlled.   Nutrition II: Labels -Discuss the different components of food labels and how to read food label   Heart Parts/Heart Disease and PAD -Discuss the anatomy of the heart, the pathway of blood circulation through the heart, and these are affected by heart disease.   Stress I: Signs and Symptoms -Discuss the causes of stress, how stress may lead to anxiety and depression, and ways to limit stress.   Stress II: Relaxation -Discuss different types of relaxation techniques to limit stress.   Warning Signs of Stroke / TIA -Discuss definition of a stroke, what the signs and symptoms are of a stroke, and how to identify when someone is having stroke.   Knowledge Questionnaire Score:  Knowledge Questionnaire Score - 09/14/20 1545      Knowledge Questionnaire Score   Pre Score 20/24           Core Components/Risk Factors/Patient Goals at Admission:  Personal Goals and Risk Factors at Admission - 09/14/20 1522      Core Components/Risk Factors/Patient Goals on Admission    Weight Management Yes;Obesity;Weight Loss    Intervention Weight Management: Develop a combined nutrition and exercise program designed to reach desired caloric intake, while maintaining appropriate intake of nutrient and fiber, sodium and fats, and appropriate energy expenditure required for the weight goal.;Weight Management: Provide education and appropriate resources to help participant work on and attain dietary goals.;Weight Management/Obesity:  Establish reasonable short term and long term weight goals.;Obesity: Provide education and appropriate resources to help participant work on and attain dietary goals.    Admit Weight 183 lb 13.8 oz (83.4 kg)    Expected Outcomes Short Term: Continue to assess and modify interventions until short term weight is achieved;Long Term: Adherence to nutrition and physical activity/exercise program aimed toward attainment of established weight goal;Weight Maintenance: Understanding of the daily nutrition guidelines, which includes 25-35% calories from fat, 7% or less cal from saturated fats, less than 200mg  cholesterol, less than 1.5gm of sodium, & 5 or more servings of fruits and vegetables daily;Weight Loss: Understanding of general recommendations for a balanced deficit meal plan, which promotes 1-2 lb weight loss per week and includes a negative energy balance of [phone] kcal/d;Understanding recommendations for meals to include 15-35% energy as protein, 25-35% energy from fat, 35-60% energy from carbohydrates, less than 200mg  of dietary cholesterol, 20-35 gm of total fiber daily;Understanding of distribution of calorie intake throughout the day with the consumption of 4-5 meals/snacks    Hypertension Yes    Intervention Provide education on lifestyle modifcations including regular physical activity/exercise, weight management, moderate sodium restriction and increased consumption of fresh fruit, vegetables, and low fat dairy, alcohol moderation, and smoking cessation.;Monitor prescription use compliance.    Expected Outcomes Short Term: Continued assessment and intervention until BP is < 140/53mm HG in hypertensive participants. < 130/68mm HG in hypertensive participants with diabetes, heart failure or chronic kidney disease.;Long Term: Maintenance of blood pressure at goal levels.    Lipids Yes    Intervention Provide education and support for participant on nutrition & aerobic/resistive exercise along with  prescribed medications to achieve LDL 70mg , HDL >40mg .    Expected Outcomes Short Term: Participant states understanding of desired cholesterol values and is compliant with medications prescribed. Participant is following exercise prescription and nutrition guidelines.;Long Term: Cholesterol controlled with medications as prescribed,  with individualized exercise RX and with personalized nutrition plan. Value goals: LDL < 70mg , HDL > 40 mg.    Stress Yes    Intervention Offer individual and/or small group education and counseling on adjustment to heart disease, stress management and health-related lifestyle change. Teach and support self-help strategies.;Refer participants experiencing significant psychosocial distress to appropriate mental health specialists for further evaluation and treatment. When possible, include family members and significant others in education/counseling sessions.    Expected Outcomes Short Term: Participant demonstrates changes in health-related behavior, relaxation and other stress management skills, ability to obtain effective social support, and compliance with psychotropic medications if prescribed.           Core Components/Risk Factors/Patient Goals Review:   Goals and Risk Factor Review    Row Name 09/20/20 1412 09/20/20 1617 10/19/20 1439 11/09/20 1743       Core Components/Risk Factors/Patient Goals Review   Personal Goals Review Weight Management/Obesity;Lipids;Hypertension;Stress Weight Management/Obesity;Lipids;Hypertension;Stress;Tobacco Cessation Weight Management/Obesity;Lipids;Hypertension;Stress;Tobacco Cessation Weight Management/Obesity;Lipids;Hypertension;Stress;Tobacco Cessation    Review Reene started exercise on 09/20/20. Decreased ectopy noted. Quality of life questionnaire reviewed Wendy [name] started exercise on 09/20/20. Decreased ectopy noted. Quality of life questionnaire reviewed. Vital signs were stable. patient continues to abstain from smoking.  Will offer emotinal support for anxiety depression as needed Patient's last exercise session was on 09/29/20. Awaiting return to exercise Wendy [name]'s attendance remains intermittent. Wendy [name] will complete cardiac rehab on 11/12/20    Expected Outcomes - Wendy [name] will continue to participte in phase 2 cardiac rehab for exercise, nutrtion and lifestyle modifications Wendy [name] will continue to participte in phase 2 cardiac rehab for exercise, nutrtion and lifestyle modifications Wendy [name] will continue to participte in phase 2 cardiac rehab for exercise, nutrtion and lifestyle modifications           Core Components/Risk Factors/Patient Goals at Discharge (Final Review):   Goals and Risk Factor Review - 11/09/20 1743      Core Components/Risk Factors/Patient Goals Review   Personal Goals Review Weight Management/Obesity;Lipids;Hypertension;Stress;Tobacco Cessation    Review Wendy [name]'s attendance remains intermittent. Wendy [name] will complete cardiac rehab on 11/12/20    Expected Outcomes Wendy [name] will continue to participte in phase 2 cardiac rehab for exercise, nutrtion and lifestyle modifications           ITP Comments:  ITP Comments    Row Name 09/15/20 1234 09/20/20 1408 10/19/20 1437 11/09/20 1741     ITP Comments Dr Fransico Him MD, Medical Director 30 Day ITP REview. Patient started exercise at cardiac rehab on 09/20/20 Tolerated well 30 Day ITP REview. Margeret has not returned to exercise. Leanna's last day of exercise was on 01/90/22 30 Day ITP REview. Roschelle continues to have sporadic attendance. Nashika will complete cardiac rehab on 11/12/20           Comments: See ITP comments.Barnet Pall, RN,BSN 11/09/2020 5:45 PM

## 2020-11-10 ENCOUNTER — Telehealth (HOSPITAL_COMMUNITY): Payer: Self-pay | Admitting: *Deleted

## 2020-11-10 ENCOUNTER — Other Ambulatory Visit: Payer: Self-pay

## 2020-11-10 ENCOUNTER — Encounter (HOSPITAL_COMMUNITY)
Admission: RE | Admit: 2020-11-10 | Discharge: 2020-11-10 | Disposition: A | Discharge status: Home / Self Care | Payer: Medicare Other | Source: Ambulatory Visit | Attending: Internal Medicine | Admitting: Internal Medicine

## 2020-11-10 DIAGNOSIS — I214 Non-ST elevation (NSTEMI) myocardial infarction: Secondary | ICD-10-CM | POA: Insufficient documentation

## 2020-11-10 DIAGNOSIS — Z955 Presence of coronary angioplasty implant and graft: Secondary | ICD-10-CM | POA: Insufficient documentation

## 2020-11-10 NOTE — Telephone Encounter (Signed)
-----   Message from Werner Lean, MD sent at 11/10/2020  2:52 PM EST ----- Regarding: RE: Return to cardiac rehab Haven't seen her yet- but from our ED colleagues assessment; no evidence of new cardiac disease.  Therefore, would be reasonable to return.  ----- Message ----- From: Magda Kiel, RN Sent: 11/10/2020   2:49 PM EST To: Werner Lean, MD Subject: Return to cardiac rehab                        Dr Gus Puma went to the emergency room with chest pain and shortness of breath on 11/02/20. Are you okay with her returning to exercise at cardiac rehab on Friday 11/12/20?   Thanks for your input!  Sincerely, Barnet Pall RN Cardiac rehab

## 2020-11-10 NOTE — Progress Notes (Signed)
Incomplete Session Note  Patient Details  Name: Wendy [name] MRN: [id number] Date of Birth: 08/01/1966 Referring Provider:   Flowsheet Row CARDIAC REHAB PHASE II ORIENTATION from 09/14/2020 in [facility]  Referring Provider Dr. Rudean Haskell, MD      [name] did not complete her rehab session.  It was noted that Jamayia went to the ED on 11/02/20 with chest pain and shortness of breath. Makayli denies complaints or symptoms today. BP 100/58. Oxygen saturation 98% on room air. Wt 88.3 kg. Lung fields clear upon assessment.Trace ankle edema otherwise no additional edema  Advised that the patient not exercise today. Will check with Dr Gasper Sells about the patient returning to exercise on Friday. Patient states understanding. Barnet Pall, RN,BSN 11/10/2020 2:44 PM

## 2020-11-10 NOTE — Telephone Encounter (Signed)
Spoke to [place] she will return to exercise on Friday per Dr Gasper Sells. Patient will be given an additional week to attend.Barnet Pall, RN,BSN 11/10/2020 4:11 PM

## 2020-11-11 ENCOUNTER — Ambulatory Visit (INDEPENDENT_AMBULATORY_CARE_PROVIDER_SITE_OTHER): Payer: Medicare Other | Admitting: Family Medicine

## 2020-11-11 ENCOUNTER — Encounter (INDEPENDENT_AMBULATORY_CARE_PROVIDER_SITE_OTHER): Payer: Self-pay

## 2020-11-11 DIAGNOSIS — M542 Cervicalgia: Secondary | ICD-10-CM | POA: Diagnosis not present

## 2020-11-11 DIAGNOSIS — M5412 Radiculopathy, cervical region: Secondary | ICD-10-CM | POA: Diagnosis not present

## 2020-11-11 DIAGNOSIS — M5136 Other intervertebral disc degeneration, lumbar region: Secondary | ICD-10-CM | POA: Diagnosis not present

## 2020-11-11 MED FILL — ROSUVASTATIN CALCIUM 40 MG: 40 | 30 days supply | Qty: 30 | Fill #4

## 2020-11-11 MED FILL — NITROGLYCERIN 0.4 MG TAB SL: 0.4 | 25 days supply | Qty: 25 | Fill #2

## 2020-11-12 ENCOUNTER — Other Ambulatory Visit: Payer: Self-pay

## 2020-11-12 ENCOUNTER — Encounter (HOSPITAL_COMMUNITY)
Admission: RE | Admit: 2020-11-12 | Discharge: 2020-11-12 | Disposition: A | Discharge status: Home / Self Care | Payer: Medicare Other | Source: Ambulatory Visit | Attending: Internal Medicine | Admitting: Internal Medicine

## 2020-11-12 DIAGNOSIS — I214 Non-ST elevation (NSTEMI) myocardial infarction: Secondary | ICD-10-CM

## 2020-11-12 DIAGNOSIS — Z955 Presence of coronary angioplasty implant and graft: Secondary | ICD-10-CM | POA: Diagnosis not present

## 2020-11-12 NOTE — Progress Notes (Signed)
Marcel returned to exercise today and exercised without difficulty.Barnet Pall, RN,BSN 11/12/2020 3:23 PM

## 2020-11-15 ENCOUNTER — Encounter (HOSPITAL_COMMUNITY)
Admission: RE | Admit: 2020-11-15 | Discharge: 2020-11-15 | Disposition: A | Discharge status: Home / Self Care | Payer: Medicare Other | Source: Ambulatory Visit | Attending: Internal Medicine | Admitting: Internal Medicine

## 2020-11-15 ENCOUNTER — Other Ambulatory Visit: Payer: Self-pay

## 2020-11-15 DIAGNOSIS — Z955 Presence of coronary angioplasty implant and graft: Secondary | ICD-10-CM | POA: Diagnosis not present

## 2020-11-15 DIAGNOSIS — I214 Non-ST elevation (NSTEMI) myocardial infarction: Secondary | ICD-10-CM

## 2020-11-17 ENCOUNTER — Encounter (HOSPITAL_COMMUNITY)
Admission: RE | Admit: 2020-11-17 | Discharge: 2020-11-17 | Disposition: A | Discharge status: Home / Self Care | Payer: Medicare Other | Source: Ambulatory Visit | Attending: Internal Medicine | Admitting: Internal Medicine

## 2020-11-17 ENCOUNTER — Encounter (HOSPITAL_COMMUNITY): Payer: Medicare Other

## 2020-11-17 ENCOUNTER — Other Ambulatory Visit: Payer: Self-pay

## 2020-11-17 VITALS — Ht 58.75 in | Wt 193.1 lb

## 2020-11-17 DIAGNOSIS — I214 Non-ST elevation (NSTEMI) myocardial infarction: Secondary | ICD-10-CM

## 2020-11-17 DIAGNOSIS — Z955 Presence of coronary angioplasty implant and graft: Secondary | ICD-10-CM

## 2020-11-18 DIAGNOSIS — R0789 Other chest pain: Secondary | ICD-10-CM | POA: Diagnosis not present

## 2020-11-18 DIAGNOSIS — R001 Bradycardia, unspecified: Secondary | ICD-10-CM | POA: Diagnosis not present

## 2020-11-18 DIAGNOSIS — R079 Chest pain, unspecified: Secondary | ICD-10-CM | POA: Diagnosis not present

## 2020-11-19 ENCOUNTER — Encounter (HOSPITAL_COMMUNITY)
Admission: RE | Admit: 2020-11-19 | Discharge: 2020-11-19 | Disposition: A | Discharge status: Home / Self Care | Payer: Medicare Other | Source: Ambulatory Visit | Attending: Internal Medicine | Admitting: Internal Medicine

## 2020-11-19 ENCOUNTER — Other Ambulatory Visit: Payer: Self-pay

## 2020-11-19 DIAGNOSIS — Z955 Presence of coronary angioplasty implant and graft: Secondary | ICD-10-CM

## 2020-11-19 DIAGNOSIS — I214 Non-ST elevation (NSTEMI) myocardial infarction: Secondary | ICD-10-CM | POA: Diagnosis not present

## 2020-11-19 NOTE — Progress Notes (Signed)
Discharge Progress Report  Patient Details  Name: Wendy [name] MRN: [id number] Date of Birth: Apr 25, 1966 Referring Provider:   Flowsheet Row CARDIAC REHAB PHASE II ORIENTATION from 09/14/2020 in [place]  Referring Provider Dr. Rudean Haskell, MD       Number of Visits: 12  Reason for Discharge:  Patient reached a stable level of exercise.  Smoking History:  Social History   Tobacco Use  Smoking Status Former Smoker  . Packs/day: 1.00  . Years: 20.00  . Pack years: 20.00  . Types: Cigarettes  . Quit date: 07/14/2020  . Years since quitting: 0.3  Smokeless Tobacco Never Used    Diagnosis:  NSTEMI (non-ST elevated myocardial infarction) ([place]) 07/14/20  07/15/20 S/P DES RCA, OM1  ADL [facility]:   Initial Exercise Prescription:  Initial Exercise Prescription - 09/14/20 1500      Date of Initial Exercise RX and Referring Provider   Date 09/14/20    Referring Provider Dr. Rudean Haskell, MD    Expected Discharge Date 11/12/20      NuStep   Level 1    SPM 75    Minutes 15    METs 1.8      Track   Laps 8    Minutes 15    METs 1.93      Prescription Details   Frequency (times per week) 3    Duration Progress to 30 minutes of continuous aerobic without signs/symptoms of physical distress      Intensity   THRR 40-80% of Max Heartrate 66-133    Ratings of Perceived Exertion 11-13    Perceived Dyspnea 0-4      Progression   Progression Continue progressive overload as per policy without signs/symptoms or physical distress.      Resistance Training   Training Prescription Yes    Weight 2 lbs    Reps 10-15           Discharge Exercise Prescription (Final Exercise Prescription Changes):  Exercise Prescription Changes - 10/29/20 1451      Response to Exercise   Blood Pressure (Admit) 108/62    Blood Pressure (Exercise) 114/60    Blood Pressure (Exit) 114/58    Heart Rate (Admit) 80 bpm    Heart Rate  (Exercise) 101 bpm    Heart Rate (Exit) 72 bpm    Rating of Perceived Exertion (Exercise) 11    Symptoms None    Comments Reviewed Home exercise Rx and Goals    Duration Continue with 30 min of aerobic exercise without signs/symptoms of physical distress.    Intensity THRR unchanged      Progression   Progression Continue to progress workloads to maintain intensity without signs/symptoms of physical distress.    Average METs 2.7      Resistance Training   Training Prescription No    Weight Arrived late not time for weights      Interval Training   Interval Training No      NuStep   Level 2    SPM 85    Minutes 15    METs 2.5      Track   Laps 16    Minutes 15    METs 2.86      Home Exercise Plan   Plans to continue exercise at Home (comment)    Frequency Add 3 additional days to program exercise sessions.    Initial Home Exercises Provided 10/29/20  Functional Capacity:  6 Minute Walk    Row Name 09/14/20 1506 11/15/20 1315       6 Minute Walk   Phase -- Discharge    Distance 800 feet 1251 feet    Distance % Change -- 56.38 %    Distance Feet Change -- 451 ft    Walk Time 6 minutes 6 minutes    # of Rest Breaks 1  5:03 - 6:00 (57 seconds) 0    MPH 1.51 2.37    METS 2.21 2.93    RPE 13 13    Perceived Dyspnea  2 0    VO2 Peak 7.73 10.25    Symptoms Yes (comment) No    Comments Low back pain/bilateral quadriceps pain 4/10 (chronic per pt), SOB, RPD = 2 --    Resting HR 87 bpm 84 bpm    Resting BP 116/52 104/70    Resting Oxygen Saturation  97 % 97 %    Exercise Oxygen Saturation  during 6 min walk 99 % 98 %    Max Ex. HR 97 bpm 99 bpm    Max Ex. BP 124/60 124/76    2 Minute Post BP 110/68 --           Psychological, QOL, Others - Outcomes: PHQ 2/9: Depression screen [facility] 2/9 11/19/2020 09/15/2020  Decreased Interest 0 2  Down, Depressed, Hopeless 1 3  PHQ - 2 Score 1 5  Altered sleeping - 2  Tired, decreased energy - 2  Change in  appetite - 0  Feeling bad or failure about yourself  - 1  Trouble concentrating - 0  Moving slowly or fidgety/restless - 0  Suicidal thoughts - 0  PHQ-9 Score - 10  Difficult doing work/chores - Very difficult    Quality of Life:  Quality of Life - 11/19/20 1430      Quality of Life   Select Quality of Life      Quality of Life Scores   Health/Function Post 27.2 %    Socioeconomic Post 24.75 %    Psych/Spiritual Post 28.29 %    Family Post 22.8 %    GLOBAL Post 26.23 %           Personal Goals: Goals established at orientation with interventions provided to work toward goal.  Personal Goals and Risk Factors at Admission - 09/14/20 1522      Core Components/Risk Factors/Patient Goals on Admission    Weight Management Yes;Obesity;Weight Loss    Intervention Weight Management: Develop a combined nutrition and exercise program designed to reach desired caloric intake, while maintaining appropriate intake of nutrient and fiber, sodium and fats, and appropriate energy expenditure required for the weight goal.;Weight Management: Provide education and appropriate resources to help participant work on and attain dietary goals.;Weight Management/Obesity: Establish reasonable short term and long term weight goals.;Obesity: Provide education and appropriate resources to help participant work on and attain dietary goals.    Admit Weight 183 lb 13.8 oz (83.4 kg)    Expected Outcomes Short Term: Continue to assess and modify interventions until short term weight is achieved;Long Term: Adherence to nutrition and physical activity/exercise program aimed toward attainment of established weight goal;Weight Maintenance: Understanding of the daily nutrition guidelines, which includes 25-35% calories from fat, 7% or less cal from saturated fats, less than 286m cholesterol, less than 1.5gm of sodium, & 5 or more servings of fruits and vegetables daily;Weight Loss: Understanding of general recommendations  for a balanced deficit meal plan,  which promotes 1-2 lb weight loss per week and includes a negative energy balance of [phone] kcal/d;Understanding recommendations for meals to include 15-35% energy as protein, 25-35% energy from fat, 35-60% energy from carbohydrates, less than 293m of dietary cholesterol, 20-35 gm of total fiber daily;Understanding of distribution of calorie intake throughout the day with the consumption of 4-5 meals/snacks    Hypertension Yes    Intervention Provide education on lifestyle modifcations including regular physical activity/exercise, weight management, moderate sodium restriction and increased consumption of fresh fruit, vegetables, and low fat dairy, alcohol moderation, and smoking cessation.;Monitor prescription use compliance.    Expected Outcomes Short Term: Continued assessment and intervention until BP is < 140/931mHG in hypertensive participants. < 130/8016mG in hypertensive participants with diabetes, heart failure or chronic kidney disease.;Long Term: Maintenance of blood pressure at goal levels.    Lipids Yes    Intervention Provide education and support for participant on nutrition & aerobic/resistive exercise along with prescribed medications to achieve LDL <56m29mDL >40mg74m Expected Outcomes Short Term: Participant states understanding of desired cholesterol values and is compliant with medications prescribed. Participant is following exercise prescription and nutrition guidelines.;Long Term: Cholesterol controlled with medications as prescribed, with individualized exercise RX and with personalized nutrition plan. Value goals: LDL < 56mg,56m > 40 mg.    Stress Yes    Intervention Offer individual and/or small group education and counseling on adjustment to heart disease, stress management and health-related lifestyle change. Teach and support self-help strategies.;Refer participants experiencing significant psychosocial distress to appropriate mental  health specialists for further evaluation and treatment. When possible, include family members and significant others in education/counseling sessions.    Expected Outcomes Short Term: Participant demonstrates changes in health-related behavior, relaxation and other stress management skills, ability to obtain effective social support, and compliance with psychotropic medications if prescribed.            Personal Goals Discharge:  Goals and Risk Factor Review    Row Name 09/20/20 1412 09/20/20 1617 10/19/20 1439 11/09/20 1743       Core Components/Risk Factors/Patient Goals Review   Personal Goals Review Weight Management/Obesity;Lipids;Hypertension;Stress Weight Management/Obesity;Lipids;Hypertension;Stress;Tobacco Cessation Weight Management/Obesity;Lipids;Hypertension;Stress;Tobacco Cessation Weight Management/Obesity;Lipids;Hypertension;Stress;Tobacco Cessation    Review AngelaSevannaed exercise on 09/20/20. Decreased ectopy noted. Quality of life questionnaire reviewed AngelaSynaed exercise on 09/20/20. Decreased ectopy noted. Quality of life questionnaire reviewed. Vital signs were stable. patient continues to abstain from smoking. Will offer emotinal support for anxiety depression as needed Patient's last exercise session was on 09/29/20. Awaiting return to exercise Leather's attendance remains intermittent. AngelaGaynorcomplete cardiac rehab on 11/12/20    Expected Outcomes -- AngelaErnacontinue to participte in phase 2 cardiac rehab for exercise, nutrtion and lifestyle modifications AngelaAlixandreacontinue to participte in phase 2 cardiac rehab for exercise, nutrtion and lifestyle modifications AngelaBrigitcontinue to participte in phase 2 cardiac rehab for exercise, nutrtion and lifestyle modifications           Exercise Goals and Review:  Exercise Goals    Row Name 09/14/20 1510             Exercise Goals   Increase Physical Activity Yes       Intervention Provide advice,  education, support and counseling about physical activity/exercise needs.;Develop an individualized exercise prescription for aerobic and resistive training based on initial evaluation findings, risk stratification, comorbidities and participant's personal goals.       Expected Outcomes Short Term: Attend rehab on  a regular basis to increase amount of physical activity.;Long Term: Add in home exercise to make exercise part of routine and to increase amount of physical activity.;Long Term: Exercising regularly at least 3-5 days a week.       Increase Strength and Stamina Yes       Intervention Provide advice, education, support and counseling about physical activity/exercise needs.;Develop an individualized exercise prescription for aerobic and resistive training based on initial evaluation findings, risk stratification, comorbidities and participant's personal goals.       Expected Outcomes Short Term: Increase workloads from initial exercise prescription for resistance, speed, and METs.;Short Term: Perform resistance training exercises routinely during rehab and add in resistance training at home;Long Term: Improve cardiorespiratory fitness, muscular endurance and strength as measured by increased METs and functional capacity (6MWT)       Able to understand and use rate of perceived exertion (RPE) scale Yes       Intervention Provide education and explanation on how to use RPE scale       Expected Outcomes Short Term: Able to use RPE daily in rehab to express subjective intensity level;Long Term:  Able to use RPE to guide intensity level when exercising independently       Knowledge and understanding of Target Heart Rate Range (THRR) Yes       Intervention Provide education and explanation of THRR including how the numbers were predicted and where they are located for reference       Expected Outcomes Short Term: Able to state/look up THRR;Short Term: Able to use daily as guideline for intensity in  rehab;Long Term: Able to use THRR to govern intensity when exercising independently       Understanding of Exercise Prescription Yes       Intervention Provide education, explanation, and written materials on patient's individual exercise prescription       Expected Outcomes Short Term: Able to explain program exercise prescription;Long Term: Able to explain home exercise prescription to exercise independently              Exercise Goals Re-Evaluation:  Exercise Goals Re-Evaluation    [place] Name 09/20/20 1443 10/29/20 1445           Exercise Goal Re-Evaluation   Exercise Goals Review Increase Physical Activity;Increase Strength and Stamina;Able to understand and use rate of perceived exertion (RPE) scale;Knowledge and understanding of Target Heart Rate Range (THRR);Understanding of Exercise Prescription Increase Physical Activity;Increase Strength and Stamina;Able to understand and use rate of perceived exertion (RPE) scale;Knowledge and understanding of Target Heart Rate Range (THRR);Understanding of Exercise Prescription      Comments Pt's first day of exercise in the [facility] program. Pt understands RPE scale, THRR, and Exercise Rx. Reviewed Goals and home exercise Rx. Pt has gained enough confidence to begin walking at home on her own. She was encouraged to walk 2-4x/week for 30 minutes to increase CV fitness and help facilitate weight loss.      Expected Outcomes Will continue to monitor patient and progress exercise workloads as tolerated. Pt will walk at home on her own 2-4 x/week for 30 minutes.             Nutrition & Weight - Outcomes:  Pre Biometrics - 09/14/20 1330      Pre Biometrics   Waist Circumference 38.5 inches    Hip Circumference 46.5 inches    Waist to Hip Ratio 0.83 %    Triceps Skinfold 45 mm    % Body Fat  48.3 %    Grip Strength 32 kg    Flexibility 11.25 in    Single Leg Stand 15.56 seconds           Post Biometrics - 11/17/20 1435       Post  Biometrics    Height 4' 10.75" (1.492 m)    Weight 87.6 kg    Waist Circumference 40 inches    Hip Circumference 46.5 inches    Waist to Hip Ratio 0.86 %    BMI (Calculated) 39.35    Triceps Skinfold 45 mm    % Body Fat 49.9 %    Grip Strength 32 kg    Flexibility 11.5 in    Single Leg Stand 6.06 seconds           Nutrition:  Nutrition Therapy & Goals - 09/29/20 1452      Nutrition Therapy   Diet TLC    Drug/Food Interactions Statins/Certain Fruits      Personal Nutrition Goals   Nutrition Goal Pt to identify food quantities necessary to achieve weight loss of 6-24 lb at graduation from cardiac rehab.    Personal Goal #2 Pt to build a healthy plate including vegetables, fruits, whole grains, and low-fat dairy products in a heart healthy meal plan.      Intervention Plan   Intervention Prescribe, educate and counsel regarding individualized specific dietary modifications aiming towards targeted core components such as weight, hypertension, lipid management, diabetes, heart failure and other comorbidities.    Expected Outcomes Long Term Goal: Adherence to prescribed nutrition plan.;Short Term Goal: A plan has been developed with personal nutrition goals set during dietitian appointment.           Nutrition Discharge:   Education Questionnaire Score:  Knowledge Questionnaire Score - 11/23/20 1132      Knowledge Questionnaire Score   Post Score 22/24           Goals reviewed with patient; copy given to patient.Pt graduated from cardiac rehab program on 11/19/20 with completion of 12 exercise sessions in Phase II. Samantha's participation was intermittent and poor at times. Mele was given an extra week to attend as she was out with several ED visits and non attendance. Hadia did gain weight while in the program. Eniyah did stop smoking. Annalee  progressed nicely during her participation in rehab as evidenced by increased MET level. Jernie increased her distance on her post exercise  walk test by 451 feet!   Medication list reconciled. Repeat  PHQ score-1. Akanksha did report not feeling as depressed as when she first completed the program. Kielyn had a increase in her quality of life questionnaire post exercise.  Pt has made significant lifestyle changes and should be commended for her success. Pt feels she has achieved her goals during cardiac rehab.   Pt plans to continue exercise by walking her dog's two-three times a week. We are proud of Jezabella's progress!Barnet Pall, RN,BSN 12/02/2020 8:25 AM

## 2020-11-25 ENCOUNTER — Ambulatory Visit (INDEPENDENT_AMBULATORY_CARE_PROVIDER_SITE_OTHER): Payer: Medicare Other | Admitting: Family Medicine

## 2020-11-25 ENCOUNTER — Ambulatory Visit: Payer: Medicare Other

## 2020-12-06 ENCOUNTER — Ambulatory Visit
Admission: RE | Admit: 2020-12-06 | Discharge: 2020-12-06 | Disposition: A | Discharge status: Home / Self Care | Payer: Medicare Other | Source: Ambulatory Visit | Attending: Physician Assistant | Admitting: Physician Assistant

## 2020-12-06 DIAGNOSIS — F17210 Nicotine dependence, cigarettes, uncomplicated: Secondary | ICD-10-CM | POA: Diagnosis not present

## 2020-12-06 DIAGNOSIS — I252 Old myocardial infarction: Secondary | ICD-10-CM

## 2020-12-06 DIAGNOSIS — Z87891 Personal history of nicotine dependence: Secondary | ICD-10-CM

## 2020-12-14 ENCOUNTER — Telehealth: Payer: Self-pay | Admitting: *Deleted

## 2020-12-14 MED FILL — Rosuvastatin Calcium Tab 40 MG: ORAL | 30 days supply | Qty: 30 | Fill #0 | Status: AC

## 2020-12-14 MED FILL — Clopidogrel Bisulfate Tab 75 MG (Base Equiv): ORAL | 90 days supply | Qty: 90 | Fill #0 | Status: AC

## 2020-12-14 NOTE — Telephone Encounter (Signed)
   [facility] Pre-operative Risk Assessment    Patient Name: Wendy [name]  DOB: 1966/01/26  MRN: [id number]   HEARTCARE STAFF: - Please ensure there is not already an duplicate clearance open for this procedure. - Under Visit Info/Reason for Call, type in Other and utilize the format Clearance MM/DD/YY or Clearance TBD. Do not use dashes or single digits. - If request is for dental extraction, please clarify the # of teeth to be extracted.  Request for surgical clearance:  1. What type of surgery is being performed? LESI L4-L5   2. When is this surgery scheduled? TBD   3. What type of clearance is required (medical clearance vs. Pharmacy clearance to hold med vs. Both)? MEDICAL  4. Are there any medications that need to be held prior to surgery and how long? PLAVIX x 7 DAYS PRIOR   5. Practice name and name of physician performing surgery? [facility]; DR. Lenord Carbo   6. What is the office phone number? [phone]   7.   What is the office fax number? [phone]  8.   Anesthesia type (None, local, MAC, general) ? NOT LISTED   Julaine Hua 12/14/2020, 2:54 PM  _________________________________________________________________   (provider comments below)

## 2020-12-14 NOTE — Telephone Encounter (Signed)
Hi Dr. Gasper Sells,   Patient has upcoming spinal injection planned and is being asked to hold Plavix for 7 days. Patient has CAD with recent NSTEMI in 07/2020 s/p DES to RCA and DES to OM1. Patient was last seen by you 10/14/2020 at which time she reported occasional nocturnal chest pain but no exertional chest pain. Is she OK to hold Plavix for 7 days and when would you feel comfortable with her holding Plavix? 6 months out from PCI or full 12 months?  Please route response back to P CV DIV PREOP.  Thank you! Donnetta Gillin

## 2020-12-15 ENCOUNTER — Other Ambulatory Visit (HOSPITAL_COMMUNITY): Payer: Self-pay

## 2020-12-15 NOTE — Telephone Encounter (Signed)
   Patient Name: Wendy [name]  DOB: [birth year]  MRN: [id number]   Primary Cardiologist: Werner Lean, MD  Chart reviewed as part of pre-operative protocol coverage. Patient has CAD with recent NSTEMI in 07/2020 s/p DES to RCA and DES to OM1. Patient was last seen by [facility] on 10/14/2020 at which time she reported occasional nocturnal chest pain but no exertional chest pain. Patient was contacted today for further pre-op evaluation at which time she continues to note occasional chest pain but upon further questions it sounds like this is really actually palpitations. Recent monitor showed underling sinus rhythm with one short run of SVT and rare PAC/PVC but no significant arrhythmias. It does not sound like angina.   I discussed with patient that ideally we would continue uninterrupted dual antiplatelet therapy for 12 months after NSTEMI (until 07/15/2021); however, Dr. Gasper Sells said it would be reasonable for her to hold Plavix for spinal injection after 6 months (after 01/12/2021) due to her back pain. Patient told me she would rather wait the full 12 months. I will let the requesting office know so they can be in touch with patient about other pain control options in the meantime.   Please send new clearance form closer to time of spinal injection so that we can complete risk assessment.  Please call with questions.  Darreld Mclean, PA-C 12/15/2020, 9:56 AM

## 2020-12-15 NOTE — Telephone Encounter (Signed)
Ideally would wait at least 6 months given NSTEMI indication; though one year is the recommendation given her back pain and prior shared decision making 6 months is reasonable.  In May 2022; ok to hold plavix for one week.  Rudean Haskell, MD [facility]  [facility] [place], [place] [address] [phone]  9:12 AM

## 2020-12-16 ENCOUNTER — Other Ambulatory Visit: Payer: Self-pay | Admitting: Neurosurgery

## 2020-12-16 ENCOUNTER — Other Ambulatory Visit (HOSPITAL_COMMUNITY): Payer: Self-pay

## 2020-12-16 DIAGNOSIS — M5136 Other intervertebral disc degeneration, lumbar region: Secondary | ICD-10-CM

## 2020-12-17 DIAGNOSIS — R001 Bradycardia, unspecified: Secondary | ICD-10-CM | POA: Diagnosis not present

## 2020-12-17 DIAGNOSIS — R42 Dizziness and giddiness: Secondary | ICD-10-CM | POA: Diagnosis not present

## 2020-12-17 DIAGNOSIS — J80 Acute respiratory distress syndrome: Secondary | ICD-10-CM | POA: Diagnosis not present

## 2020-12-17 DIAGNOSIS — R079 Chest pain, unspecified: Secondary | ICD-10-CM | POA: Diagnosis not present

## 2020-12-17 DIAGNOSIS — R0789 Other chest pain: Secondary | ICD-10-CM | POA: Diagnosis not present

## 2020-12-18 ENCOUNTER — Emergency Department (HOSPITAL_COMMUNITY): Payer: Medicare Other

## 2020-12-18 ENCOUNTER — Emergency Department (HOSPITAL_COMMUNITY)
Admission: EM | Admit: 2020-12-18 | Discharge: 2020-12-18 | Disposition: A | Discharge status: Home / Self Care | Payer: Medicare Other | Attending: Emergency Medicine | Admitting: Emergency Medicine

## 2020-12-18 ENCOUNTER — Other Ambulatory Visit: Payer: Self-pay

## 2020-12-18 ENCOUNTER — Encounter (HOSPITAL_COMMUNITY): Payer: Self-pay | Admitting: Emergency Medicine

## 2020-12-18 DIAGNOSIS — Z87891 Personal history of nicotine dependence: Secondary | ICD-10-CM | POA: Insufficient documentation

## 2020-12-18 DIAGNOSIS — J45909 Unspecified asthma, uncomplicated: Secondary | ICD-10-CM | POA: Diagnosis not present

## 2020-12-18 DIAGNOSIS — R42 Dizziness and giddiness: Secondary | ICD-10-CM | POA: Insufficient documentation

## 2020-12-18 DIAGNOSIS — R112 Nausea with vomiting, unspecified: Secondary | ICD-10-CM | POA: Insufficient documentation

## 2020-12-18 DIAGNOSIS — I10 Essential (primary) hypertension: Secondary | ICD-10-CM | POA: Insufficient documentation

## 2020-12-18 DIAGNOSIS — I708 Atherosclerosis of other arteries: Secondary | ICD-10-CM | POA: Diagnosis not present

## 2020-12-18 DIAGNOSIS — K5750 Diverticulosis of both small and large intestine without perforation or abscess without bleeding: Secondary | ICD-10-CM | POA: Diagnosis not present

## 2020-12-18 DIAGNOSIS — K921 Melena: Secondary | ICD-10-CM | POA: Insufficient documentation

## 2020-12-18 DIAGNOSIS — Z91040 Latex allergy status: Secondary | ICD-10-CM | POA: Insufficient documentation

## 2020-12-18 DIAGNOSIS — I2510 Atherosclerotic heart disease of native coronary artery without angina pectoris: Secondary | ICD-10-CM | POA: Diagnosis not present

## 2020-12-18 DIAGNOSIS — Z79899 Other long term (current) drug therapy: Secondary | ICD-10-CM | POA: Insufficient documentation

## 2020-12-18 DIAGNOSIS — Z7982 Long term (current) use of aspirin: Secondary | ICD-10-CM | POA: Diagnosis not present

## 2020-12-18 DIAGNOSIS — R197 Diarrhea, unspecified: Secondary | ICD-10-CM | POA: Insufficient documentation

## 2020-12-18 DIAGNOSIS — R0789 Other chest pain: Secondary | ICD-10-CM | POA: Diagnosis not present

## 2020-12-18 DIAGNOSIS — Z91010 Allergy to peanuts: Secondary | ICD-10-CM | POA: Diagnosis not present

## 2020-12-18 DIAGNOSIS — R1084 Generalized abdominal pain: Secondary | ICD-10-CM | POA: Diagnosis not present

## 2020-12-18 DIAGNOSIS — R0602 Shortness of breath: Secondary | ICD-10-CM | POA: Diagnosis not present

## 2020-12-18 DIAGNOSIS — K219 Gastro-esophageal reflux disease without esophagitis: Secondary | ICD-10-CM | POA: Insufficient documentation

## 2020-12-18 DIAGNOSIS — N200 Calculus of kidney: Secondary | ICD-10-CM | POA: Diagnosis not present

## 2020-12-18 LAB — BASIC METABOLIC PANEL
Anion gap: 3 — ABNORMAL LOW (ref 5–15)
BUN: 9 mg/dL (ref 6–20)
CO2: 27 mmol/L (ref 22–32)
Calcium: 9.1 mg/dL (ref 8.9–10.3)
Chloride: 108 mmol/L (ref 98–111)
Creatinine, Ser: 0.78 mg/dL (ref 0.44–1.00)
GFR, Estimated: >60 mL/min (ref >60)
Glucose, Bld: 95 mg/dL (ref 70–99)
Potassium: 3.6 mmol/L (ref 3.5–5.1)
Sodium: 138 mmol/L (ref 135–145)

## 2020-12-18 LAB — CBC
HCT: 39.6 % (ref 36.0–46.0)
Hemoglobin: 12.2 g/dL (ref 12.0–15.0)
MCH: 27.7 pg (ref 26.0–34.0)
MCHC: 30.8 g/dL (ref 30.0–36.0)
MCV: 89.8 fL (ref 80.0–100.0)
Platelets: 213 10*3/uL (ref 150–400)
RBC: 4.41 MIL/uL (ref 3.87–5.11)
RDW: 12.3 % (ref 11.5–15.5)
WBC: 5.6 10*3/uL (ref 4.0–10.5)
nRBC: 0 % (ref 0.0–0.2)

## 2020-12-18 LAB — URINALYSIS, ROUTINE W REFLEX MICROSCOPIC
Bilirubin Urine: NEGATIVE
Glucose, UA: NEGATIVE mg/dL
Ketones, ur: NEGATIVE mg/dL
Leukocytes,Ua: NEGATIVE
Nitrite: NEGATIVE
Protein, ur: NEGATIVE mg/dL
Specific Gravity, Urine: 1.005 (ref 1.005–1.030)
pH: 7 (ref 5.0–8.0)

## 2020-12-18 LAB — I-STAT BETA HCG BLOOD, ED (MC, WL, AP ONLY): I-stat hCG, quantitative: <5 m[IU]/mL (ref <5)

## 2020-12-18 LAB — TROPONIN I (HIGH SENSITIVITY)
Troponin I (High Sensitivity): 5 ng/L (ref <18)
Troponin I (High Sensitivity): 4 ng/L (ref <18)

## 2020-12-18 LAB — BRAIN NATRIURETIC PEPTIDE: B Natriuretic Peptide: 163.1 pg/mL — ABNORMAL HIGH (ref 0.0–100.0)

## 2020-12-18 LAB — POC OCCULT BLOOD, ED: Fecal Occult Bld: NEGATIVE

## 2020-12-18 MED ORDER — ALUM & MAG HYDROXIDE-SIMETH 200-200-20 MG/5ML PO SUSP
30.0000 mL | Freq: Once | ORAL | Status: AC
  Administered 2020-12-18: 30 mL via ORAL
  Filled 2020-12-18: qty 30

## 2020-12-18 MED ORDER — PANTOPRAZOLE SODIUM 40 MG IV SOLR
40.0000 mg | Freq: Once | INTRAVENOUS | Status: AC
  Administered 2020-12-18: 40 mg via INTRAVENOUS
  Filled 2020-12-18: qty 40

## 2020-12-18 MED ORDER — LORAZEPAM 2 MG/ML IJ SOLN
1.0000 mg | Freq: Once | INTRAMUSCULAR | Status: AC
  Administered 2020-12-18: 1 mg via INTRAVENOUS
  Filled 2020-12-18: qty 1

## 2020-12-18 MED ORDER — SODIUM CHLORIDE 0.9 % IV BOLUS
1000.0000 mL | Freq: Once | INTRAVENOUS | Status: AC
  Administered 2020-12-18: 1000 mL via INTRAVENOUS

## 2020-12-18 NOTE — ED Notes (Signed)
All appropriate discharge materials reviewed at length with patient. Time for questions provided. Pt has no other questions at this time and verbalizes understanding of all provided materials.  

## 2020-12-18 NOTE — ED Triage Notes (Signed)
Pt. Stated, I started having dizziness, and SOB last night . I called EMS last night they said to call your Dr. Today I started having stomach pain with N/V/D.

## 2020-12-18 NOTE — ED Provider Notes (Signed)
Care assumed at shift change from [place], [place], pending remainder of workup. See their note for full HPI and workup. Briefly, pt presenting with LH, CP ,SOB while walking yesterday, all improved after belching. N/V/D, abd pain, Rectal bleeding last night. Intermittent chest pressure since last night, seems noncardiac chest pain, and more so related to GI symptoms with heartburn and N/V/D.  Physical Exam  BP (!) 113/53 (BP Location: Right Arm)   Pulse (!) 54   Temp 98.5 F (36.9 C) (Oral)   Resp 18   LMP 07/12/2016 (Approximate) Comment: 2-3 day cycle  SpO2 98%   Physical Exam Vitals and nursing note reviewed.  Constitutional:      Appearance: She is well-developed.  HENT:     Head: Normocephalic and atraumatic.  Eyes:     Conjunctiva/sclera: Conjunctivae normal.  Cardiovascular:     Rate and Rhythm: Normal rate.  Pulmonary:     Effort: Pulmonary effort is normal.  Abdominal:     General: Bowel sounds are normal.     Palpations: Abdomen is soft.  Neurological:     Mental Status: She is alert.  Psychiatric:        Mood and Affect: Mood normal.        Behavior: Behavior normal.     ED Course/Procedures   Clinical Course as of 12/18/20 1833  Sat Dec 18, 2020  1720 Patient evaluated. Reports mild generalized abd pain. In no distress. Discussed reassuring CT results. Pending repeat trop, medication administration, U/A. [JR]    Clinical Course User Index [JR] Zayn Selley, [place] N, PA-C    Procedures Results for orders placed or performed during the hospital encounter of 70/35/00  Basic metabolic panel  Result Value Ref Range   Sodium 138 135 - 145 mmol/L   Potassium 3.6 3.5 - 5.1 mmol/L   Chloride 108 98 - 111 mmol/L   CO2 27 22 - 32 mmol/L   Glucose, Bld 95 70 - 99 mg/dL   BUN 9 6 - 20 mg/dL   Creatinine, Ser 0.78 0.44 - 1.00 mg/dL   Calcium 9.1 8.9 - 10.3 mg/dL   GFR, Estimated >60 >60 mL/min   Anion gap 3 (L) 5 - 15  CBC  Result Value Ref Range   WBC 5.6 4.0 - 10.5  K/uL   RBC 4.41 3.87 - 5.11 MIL/uL   Hemoglobin 12.2 12.0 - 15.0 g/dL   HCT 39.6 36.0 - 46.0 %   MCV 89.8 80.0 - 100.0 fL   MCH 27.7 26.0 - 34.0 pg   MCHC 30.8 30.0 - 36.0 g/dL   RDW 12.3 11.5 - 15.5 %   Platelets 213 150 - 400 K/uL   nRBC 0.0 0.0 - 0.2 %  Urinalysis, Routine w reflex microscopic Urine, Clean Catch  Result Value Ref Range   Color, Urine STRAW (A) YELLOW   APPearance HAZY (A) CLEAR   Specific Gravity, Urine 1.005 1.005 - 1.030   pH 7.0 5.0 - 8.0   Glucose, UA NEGATIVE NEGATIVE mg/dL   Hgb urine dipstick SMALL (A) NEGATIVE   Bilirubin Urine NEGATIVE NEGATIVE   Ketones, ur NEGATIVE NEGATIVE mg/dL   Protein, ur NEGATIVE NEGATIVE mg/dL   Nitrite NEGATIVE NEGATIVE   Leukocytes,Ua NEGATIVE NEGATIVE   RBC / HPF 0-5 0 - 5 RBC/hpf   WBC, UA 0-5 0 - 5 WBC/hpf   Bacteria, UA RARE (A) NONE SEEN   Squamous Epithelial / LPF 6-10 0 - 5   Mucus PRESENT   Brain natriuretic peptide  Result Value Ref Range   B Natriuretic Peptide 163.1 (H) 0.0 - 100.0 pg/mL  I-Stat beta hCG blood, ED  Result Value Ref Range   I-stat hCG, quantitative <5.0 <5 mIU/mL   Comment 3          POC occult blood, ED  Result Value Ref Range   Fecal Occult Bld NEGATIVE NEGATIVE  Troponin I (High Sensitivity)  Result Value Ref Range   Troponin I (High Sensitivity) 5 <18 ng/L  Troponin I (High Sensitivity)  Result Value Ref Range   Troponin I (High Sensitivity) 4 <18 ng/L   CT Abdomen Pelvis Wo Contrast  Result Date: 12/18/2020 CLINICAL DATA:  Acute, non localized abdominal pain with nausea, vomiting and blood in the stool today. EXAM: CT ABDOMEN AND PELVIS WITHOUT CONTRAST TECHNIQUE: Multidetector CT imaging of the abdomen and pelvis was performed following the standard protocol without IV contrast. COMPARISON:  02/18/2019.  Chest radiographs obtained earlier today. FINDINGS: Lower chest: Stable mild linear scarring at the left lung base. Stable enlarged heart. Left coronary artery stent. Hepatobiliary:  No focal liver abnormality is seen. No gallstones, gallbladder wall thickening, or biliary dilatation. Pancreas: Unremarkable. No pancreatic ductal dilatation or surrounding inflammatory changes. Spleen: Normal in size without focal abnormality. Adrenals/Urinary Tract: Adrenal glands are unremarkable. Stable tiny lower pole left renal calculus. Otherwise, unremarkable kidneys and ureters. Bladder is unremarkable. Stomach/Bowel: Mild sigmoid colon diverticulosis without evidence of diverticulitis. No evidence of appendicitis. Unremarkable stomach and small bowel. Vascular/Lymphatic: Atheromatous arterial calcifications without aneurysm. No enlarged lymph nodes. Reproductive: Uterus and bilateral adnexa are unremarkable. Other: No abdominal wall hernia or abnormality. No abdominopelvic ascites. Musculoskeletal: Lumbar and lower thoracic spine degenerative changes. IMPRESSION: 1. No acute abnormality. 2. Mild sigmoid diverticulosis. 3. Stable tiny, nonobstructing lower pole left renal calculus. Electronically Signed   By: Claudie Revering M.D.   On: 12/18/2020 16:39   DG Chest 2 View  Result Date: 12/18/2020 CLINICAL DATA:  Shortness of breath EXAM: CHEST - 2 VIEW COMPARISON:  November 01, 2020 chest radiograph; chest CT December 06, 2020 FINDINGS: Lungs are clear. Heart is slightly enlarged with pulmonary vascularity normal. No adenopathy. There is aortic atherosclerosis. No bone lesions. IMPRESSION: Heart slightly enlarged. Lungs clear. Aortic Atherosclerosis (ICD10-I70.0). Electronically Signed   By: Lowella Grip III M.D.   On: 12/18/2020 15:30      MDM  Symptoms improved with GI cocktail, IV fluids and Ativan for nausea (QT prolongation with history of the same)  CT scan is negative.  Delta trop is also negative.  UA is unremarkable.  Low suspicion for any cardiac cause of chest pain.  Symptoms improved with medications here.  Recommend clear liquids, antacids, PCP follow-up.  Strict return precautions  discussed.  She is discharged in no distress and agreeable with plan.       Jayde Daffin, [place] N, PA-C 12/18/20 1839    Daleen Bo, MD 12/20/20 [phone]

## 2020-12-18 NOTE — ED Provider Notes (Signed)
Wendy [name]   CSN: [id number] Arrival date & time: 12/18/20  1417     History Chief Complaint  Patient presents with  . Abdominal Pain  . Chest Pain  . Dizziness  . Nausea  . Emesis    Wendy [name] is a 55 y.o. female with pertinent past medical history of hypertension, hyperlipidemia, depression, tobacco abuse, GERD, chronic abdominal pain recent NSTEMI in November 2021 (mRCA DES, 50% pRCA; OM1 PCI, D1 50%) with preserved EF that presents emerge department today for complaints of shortness of breath, dizziness, chest tightness, nausea, vomiting, diarrhea, bloody stools.  Patient states yesterday when she was at the store, she was walking and speaking to someone and all of a sudden felt slightly short of breath and dizzy with some chest tightness, states that she burped and drank some water and then felt immediately better.  Dizziness not described as vertiginous. No syncope. EMS was called however she states that she did not feel like she needed to go since she had immediate resolution of symptoms with water, however this morning she states that the same thing happened again, therefore she came here.  This time she also had vomiting with diarrhea.  States that she did have an episode this morning where she wiped and she did have blood on the toilet paper, no blood in the toilet bowl.  Denies any rectal pain.  Patient also admits to generalized abdominal pain.   Denies any chest pain or shortness of breath currently.  States that she never really had chest pain, felt more like a pressure sensation in her chest.  Does not feel similar to her prior MI.  Patient follows Dr. Gasper Sells, cardiology.  Patient denies any leg swelling, fevers, palpitations.  States that she did have to lay in a chair last night to go to bed, unsure if it was from her abdominal pain or if this was actually orthopnea.  Has not happened her before. No cough or URI  symptoms. Patient has been compliant with her metoprolol and her Plavix.  Chart review also shows that patient presented to the urgent care for abdominal burping and abdominal pain many months ago patient has a detailed history involving chronic abdominal pain and has multiple abdominal scans and an esophagus study which all been benign.  HPI     Past Medical History:  Diagnosis Date  . Anxiety   . Asthma    daily and prn inhalers  . Cervical disc disease    decreased range of motion  . Chronic pain    shoulders, neck, lower back  . Coronary artery disease   . Deformity, hand    left  . Depression   . Endometriosis    Lupron injection Q 3 mos.  . Fluid retention in legs   . Hyperlipidemia   . Hypertension   . Injury, brachial plexus    left  . Multiple allergies    takes allergy shots  . PONV (postoperative nausea and vomiting)   . 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 Wears partial dentures    upper partial    Patient Active Problem List   Diagnosis Date Noted  . PVC (premature ventricular contraction) 10/14/2020  . Coronary artery disease involving native coronary artery of native heart without angina pectoris 07/30/2020  . Essential hypertension 07/30/2020  . Hyperlipidemia 07/30/2020  . Gastroesophageal reflux disease without esophagitis 07/30/2020  . Nonrheumatic mitral valve regurgitation 07/30/2020  .  Prolonged Q-T interval on ECG 07/30/2020  . NSTEMI (non-ST elevated myocardial infarction) ([place]) 07/15/2020  . BV (bacterial vaginosis) 03/06/2013  . ANXIETY 11/05/2007  . DEPRESSION 11/05/2007  . NAUSEA, CHRONIC 11/05/2007  . FIBROIDS, UTERUS 03/26/2007  . MIGRAINE, COMMON 03/26/2007  . OVARIAN CYST 03/26/2007  . 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 [place] DISEASE, LUMBOSACRAL SPINE 03/26/2007  . 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    Past Surgical History:  Procedure Laterality Date  . ANKLE RECONSTRUCTION   02/01/2012   Procedure: RECONSTRUCTION ANKLE;  Surgeon: Wylene Simmer, MD;  Location: [facility];  Service: Orthopedics;  Laterality: Right;  Excision of right calcaneonavicular coalition with autologus fat graft interposition  . BRACHIAL PLEXUS EXPLORATION    . CARDIAC CATHETERIZATION    . CORONARY STENT INTERVENTION N/A 07/15/2020   Procedure: CORONARY STENT INTERVENTION;  Surgeon: Jettie Booze, MD;  Location: [facility];  Service: Cardiovascular;  Laterality: N/A;  . DIAGNOSTIC LAPAROSCOPY  10/02/2008   peritoneal bx.  Marland Kitchen LEFT HEART CATH AND CORONARY ANGIOGRAPHY N/A 07/15/2020   Procedure: LEFT HEART CATH AND CORONARY ANGIOGRAPHY;  Surgeon: Jettie Booze, MD;  Location: [facility];  Service: Cardiovascular;  Laterality: N/A;  . MULTIPLE TOOTH EXTRACTIONS     upper teeth and wisdom teeth  . OVARIAN CYST REMOVAL  2000  . RIB RESECTION     left - cervical rib removal  . SHOULDER ARTHROSCOPY W/ ROTATOR CUFF REPAIR  07/2011   right  . SHOULDER SURGERY     left  . TOOTH EXTRACTION     x 1     OB History    Gravida  0   Para  0   Term  0   Preterm  0   AB  0   Living  0     SAB  0   IAB  0   Ectopic  0   Multiple  0   Live Births              Family History  Problem Relation Age of Onset  . Cancer Mother   . Dementia Father   . Stroke Father     Social History   Tobacco Use  . Smoking status: Former Smoker    Packs/day: 1.00    Years: 20.00    Pack years: 20.00    Types: Cigarettes    Quit date: 07/14/2020    Years since quitting: 0.4  . Smokeless tobacco: Never Used  Vaping Use  . Vaping Use: Never used  Substance Use Topics  . Alcohol use: No    Alcohol/week: 0.0 standard drinks    Comment: occasional  . Drug use: No    Home Medications Prior to Admission medications   Medication Sig Start Date End Date Taking? Authorizing Provider  acetaminophen (TYLENOL) 325 MG tablet Take 2 tablets (650 mg total)  by mouth every 6 (six) hours as needed. Patient taking differently: Take 650 mg by mouth every 6 (six) hours as needed for mild pain or headache. 10/01/20   Mesner, Corene Cornea, MD  albuterol (PROVENTIL HFA;VENTOLIN HFA) 108 (90 Base) MCG/ACT inhaler Inhale 2 puffs into the lungs every 4 (four) hours as needed for wheezing or shortness of breath (cough). 11/20/16   Street, [place], PA-C  aspirin EC 81 MG EC tablet Take 1 tablet (81 mg total) by mouth daily. Swallow whole. 07/17/20   Bhagat, Crista Luria, PA  beclomethasone (QVAR) 80 MCG/ACT inhaler Inhale 1 puff  into the lungs 2 (two) times daily as needed (sob/wheezing).     [provider]  clopidogrel (PLAVIX) 75 MG tablet TAKE 1 TABLET (75 MG TOTAL) BY MOUTH DAILY. 09/21/20 09/21/21  Werner Lean, MD  cyclobenzaprine (FLEXERIL) 10 MG tablet Take 1 tablet (10 mg total) by mouth 2 (two) times daily as needed for muscle spasms. 10/01/20   Mesner, Corene Cornea, MD  diltiazem (CARDIZEM CD) 120 MG 24 hr capsule TAKE 1 CAPSULE (120 MG TOTAL) BY MOUTH DAILY. 10/14/20 10/14/21  Werner Lean, MD  EPINEPHrine (EPIPEN JR) 0.15 MG/0.3ML injection Inject 0.15 mg into the muscle daily as needed for anaphylaxis.     [provider]  ergocalciferol (VITAMIN D2) 50000 UNITS capsule Take 50,000 Units by mouth every Sunday. Sundays    [provider]  furosemide (LASIX) 20 MG tablet TAKE 1/2 TABLET(10 MG) BY MOUTH DAILY AS NEEDED FOR FLUID RETENTION OR SWELLING 11/08/20 11/08/21  Chandrasekhar, Mahesh A, MD  gabapentin (NEURONTIN) 600 MG tablet Take 600 mg by mouth at bedtime.    [provider]  hydrOXYzine (ATARAX/VISTARIL) 25 MG tablet Take 25 mg by mouth 3 (three) times daily as needed for anxiety or itching.  10/09/19   [provider]  isosorbide mononitrate (IMDUR) 30 MG 24 hr tablet TAKE 1 TABLET (30 MG TOTAL) BY MOUTH DAILY. 11/08/20 11/08/21  Rudean Haskell A, MD  metoprolol tartrate (LOPRESSOR) 25 MG tablet TAKE  1 TABLET (25 MG TOTAL) BY MOUTH 2 (TWO) TIMES DAILY. 09/27/20 09/27/21  Chandrasekhar, Lyda Kalata A, MD  nitroGLYCERIN (NITROSTAT) 0.4 MG SL tablet PLACE 1 TABLET (0.4 MG TOTAL) UNDER THE TONGUE EVERY 5 (FIVE) MINUTES FOR 3 DOSES AS NEEDED FOR CHEST PAIN. 07/16/20 07/16/21  Bhagat, Crista Luria, PA  polyethylene glycol (MIRALAX) 17 g packet Take 17 g by mouth daily. Patient taking differently: Take 17 g by mouth every other day. 07/24/20   Scot Jun, FNP  potassium chloride SA (KLOR-CON) 20 MEQ tablet Take 20 mEq by mouth daily.    [provider]  rosuvastatin (CRESTOR) 40 MG tablet TAKE 1 TABLET (40 MG TOTAL) BY MOUTH DAILY. 07/16/20 07/16/21  Leanor Kail, PA  senna (SENOKOT) 8.6 MG TABS tablet Take 1 tablet by mouth every other day.    [provider]    Allergies    Pfizer-biontech covid-19 vacc [phone] mrna vacc (moderna)], Chocolate, Ivp dye [iodinated diagnostic agents], Latex, Shellfish allergy, Fish-derived products, Peanut-containing drug products, and Soap  Review of Systems   Review of Systems  Constitutional: Negative for chills, diaphoresis, fatigue and fever.  HENT: Negative for congestion, sore throat and trouble swallowing.   Eyes: Negative for pain and visual disturbance.  Respiratory: Positive for chest tightness and shortness of breath. Negative for cough and wheezing.   Cardiovascular: Negative for chest pain, palpitations and leg swelling.  Gastrointestinal: Positive for abdominal pain, blood in stool, diarrhea, nausea and vomiting. Negative for abdominal distention.  Genitourinary: Negative for difficulty urinating.  Musculoskeletal: Negative for back pain, neck pain and neck stiffness.  Skin: Negative for pallor.  Neurological: Negative for dizziness, speech difficulty, weakness and headaches.  Psychiatric/Behavioral: Negative for confusion.    Physical Exam Updated Vital Signs BP (!) 113/53 (BP Location: Right Arm)   Pulse (!) 54    Temp 98.5 F (36.9 C) (Oral)   Resp 18   LMP 07/12/2016 (Approximate) Comment: 2-3 day cycle  SpO2 98%   Physical Exam Constitutional:      General: She is not in acute  distress.    Appearance: Normal appearance. She is not ill-appearing, toxic-appearing or diaphoretic.  HENT:     Mouth/Throat:     Mouth: Mucous membranes are moist.     Pharynx: Oropharynx is clear.  Eyes:     General: No scleral icterus.    Extraocular Movements: Extraocular movements intact.     Pupils: Pupils are equal, round, and reactive to light.  Cardiovascular:     Rate and Rhythm: Normal rate and regular rhythm.     Pulses: Normal pulses.     Heart sounds: Normal heart sounds.  Pulmonary:     Effort: Pulmonary effort is normal. No respiratory distress.     Breath sounds: Normal breath sounds. No stridor. No wheezing, rhonchi or rales.  Chest:     Chest wall: No tenderness.  Abdominal:     General: Abdomen is flat. There is no distension.     Palpations: Abdomen is soft.     Tenderness: There is generalized abdominal tenderness. There is no guarding or rebound.  Genitourinary:    Comments: Chaperone present. Digital Rectal exam reveals sphincter with good tone. No external hemorrhoids, masses, or fissures. Stool color is brown with no overt blood. No gross melena.   Musculoskeletal:        General: No swelling or tenderness. Normal range of motion.     Cervical back: Normal range of motion and neck supple. No rigidity.     Right lower leg: No edema.     Left lower leg: No edema.     Comments: No leg swelling  Skin:    General: Skin is warm and dry.     Capillary Refill: Capillary refill takes less than 2 seconds.     Coloration: Skin is not pale.  Neurological:     General: No focal deficit present.     Mental Status: She is alert and oriented to person, place, and time.     Cranial Nerves: No cranial nerve deficit.     Sensory: No sensory deficit.     Motor: No weakness.     Coordination:  Coordination normal.     Gait: Gait normal.     Deep Tendon Reflexes: Reflexes normal.  Psychiatric:        Mood and Affect: Mood normal.        Behavior: Behavior normal.     ED Results / Procedures / Treatments   Labs (all labs ordered are listed, but only abnormal results are displayed) Labs Reviewed  BASIC METABOLIC PANEL - Abnormal; Notable for the following components:      Result Value   Anion gap 3 (*)    All other components within normal limits  BRAIN NATRIURETIC PEPTIDE - Abnormal; Notable for the following components:   B Natriuretic Peptide 163.1 (*)    All other components within normal limits  CBC  URINALYSIS, ROUTINE W REFLEX MICROSCOPIC  CBG MONITORING, ED  I-STAT BETA HCG BLOOD, ED ([facility], [facility], [facility] ONLY)  POC OCCULT BLOOD, ED  TROPONIN I (HIGH SENSITIVITY)    EKG EKG Interpretation  Date/Time:  Saturday December 18 2020 14:28:03 EDT Ventricular Rate:  56 PR Interval:  159 QRS Duration: 86 QT Interval:  461 QTC Calculation: 445 R Axis:   32 Text Interpretation: Sinus rhythm since last tracing no significant change Confirmed by Daleen Bo [id number]) on 12/18/2020 3:35:56 PM   Radiology DG Chest 2 View  Result Date: 12/18/2020 CLINICAL DATA:  Shortness of breath EXAM: CHEST - 2 VIEW  COMPARISON:  November 01, 2020 chest radiograph; chest CT December 06, 2020 FINDINGS: Lungs are clear. Heart is slightly enlarged with pulmonary vascularity normal. No adenopathy. There is aortic atherosclerosis. No bone lesions. IMPRESSION: Heart slightly enlarged. Lungs clear. Aortic Atherosclerosis (ICD10-I70.0). Electronically Signed   By: Lowella Grip III M.D.   On: 12/18/2020 15:30    Procedures Procedures   Medications Ordered in ED Medications  LORazepam (ATIVAN) injection 1 mg (has no administration in time range)  pantoprazole (PROTONIX) injection 40 mg (has no administration in time range)  alum & mag hydroxide-simeth (MAALOX/MYLANTA) 200-200-20 MG/5ML suspension 30  mL (has no administration in time range)  sodium chloride 0.9 % bolus 1,000 mL (1,000 mLs Intravenous New Bag/Given 12/18/20 1455)    ED Course  I have reviewed the triage vital signs and the nursing notes.  Pertinent labs & imaging results that were available during my care of the patient were reviewed by me and considered in my medical decision making (see chart for details).    MDM Rules/Calculators/A&P                         MUMTAZ LOVINS is a 55 y.o. female with pertinent past medical history of hypertension, hyperlipidemia, depression, tobacco abuse, GERD, chronic abdominal pain recent NSTEMI in November 2021 (mRCA DES, 50% pRCA; OM1 PCI, D1 50%) with preserved EF that presents emerge department today for complaints of shortness of breath, dizziness, chest tightness, nausea, vomiting, diarrhea, bloody stools.  Patient appears well, hemodynamically stable, digital rectal exam did not show any gross blood.  Hemoccult was negative.  Differential to include ACS, CHF, colitis, gastroenteritis, GERD, PUD.  Low likelihood for acute GI bleed, patient most likely with diarrhea and had rectal tear which is where she has some blood on the toilet paper.  No gross blood on the toilet bowl.  Hemoglobin stable.  Work-up today so far shows CBC without any leukocytosis, hemoglobin stable.  Fecal occult negative.  Chest x-ray interpreted by me does show enlarged heart.  EKG sinus rhythm. First trop 5, will need second since chest pressure has been intermittent. BNP slighlty elevated 163.   Pt care was handed off to J. Quentin Cornwall PA-C at 4. Complete history and physical and current plan have been communicated.  Please refer to their [name] for the remainder of ED care and ultimate disposition. Pending other labs and CT abdomen pelvis. Pending orthostatics.    Final Clinical Impression(s) / ED Diagnoses Final diagnoses:  Nausea vomiting and diarrhea  Dizziness    Rx / DC Orders ED Discharge Orders    None        Alfredia Client, PA-C 12/18/20 1604    Daleen Bo, MD 12/20/20 1103

## 2020-12-18 NOTE — Discharge Instructions (Addendum)
Please read instructions below. Drink clear liquids until your stomach feels better. Then, slowly introduce bland foods into your diet as tolerated, such as bread, rice, apples, bananas. You can take pepcid as needed for heartburn, indigestion, stomach upset. Follow up with your primary care. Return to the ER for severe abdominal pain, fever, uncontrollable vomiting, or new or concerning symptoms.

## 2021-01-08 DIAGNOSIS — E785 Hyperlipidemia, unspecified: Secondary | ICD-10-CM | POA: Diagnosis not present

## 2021-01-08 DIAGNOSIS — K219 Gastro-esophageal reflux disease without esophagitis: Secondary | ICD-10-CM | POA: Diagnosis not present

## 2021-01-08 DIAGNOSIS — I10 Essential (primary) hypertension: Secondary | ICD-10-CM | POA: Diagnosis not present

## 2021-01-13 ENCOUNTER — Other Ambulatory Visit (HOSPITAL_COMMUNITY): Payer: Self-pay

## 2021-01-13 MED FILL — Rosuvastatin Calcium Tab 40 MG: ORAL | 30 days supply | Qty: 30 | Fill #1 | Status: AC

## 2021-01-13 MED FILL — Furosemide Tab 20 MG: ORAL | 90 days supply | Qty: 45 | Fill #0 | Status: CN

## 2021-01-13 MED FILL — Diltiazem HCl Coated Beads Cap ER 24HR 120 MG: ORAL | 90 days supply | Qty: 90 | Fill #0 | Status: AC

## 2021-01-14 ENCOUNTER — Other Ambulatory Visit (HOSPITAL_COMMUNITY): Payer: Self-pay

## 2021-01-14 MED FILL — Furosemide Tab 20 MG: ORAL | 90 days supply | Qty: 45 | Fill #0 | Status: AC

## 2021-01-17 ENCOUNTER — Other Ambulatory Visit (HOSPITAL_COMMUNITY): Payer: Self-pay

## 2021-01-20 NOTE — Progress Notes (Signed)
.  Cardiology Office Note:    Date:  01/24/2021   ID:  Wendy [name], DOB 1966/06/02, MRN [id number]  PCP:  Johna Roles, PA  [facility] Cardiologist:  Werner Lean, MD  [facility] Electrophysiologist:  None   Referring MD: Wardell Honour, MD   CC: Follow up visit; planned for spine injection  History of Present Illness:    Wendy [name] is a 55 y.o. female with a hx of CAD (NSTEMI 07/16/20 with mRCA DES, 50% pRCA; OM1 PCI, D1 50%) with preserved EF, HTN, HLD, Former Tobacco Use, GERD, who presented 07/15/20 for NSTEMI. Seen 09/21/2020 with PVCs and ZioPatch was placed.  In interim, see 10/01/20 and 10/08/20 with chest pain; no change in novel biomarkers.  Started on 10/08/20.  Patient notes that she is doing OK.  Since last visit notes ED visit had nausea and vomiting with blood in her stoo in March and April.  Relevant interval testing or therapy include plavix. Started probiotics.    No chest pain. No SOB/DOE and no PND/Orthopnea.  No weight gain but is unable to loose weight.  Notes some leg swelling.  No change in  palpitations and no syncope.  Notes back pain.  Notes blood in her stool (a lot during ED admission).  Last event 12/19/20. Has pictures of events.   Past Medical History:  Diagnosis Date  . Anxiety   . Asthma    daily and prn inhalers  . Cervical disc disease    decreased range of motion  . Chronic pain    shoulders, neck, lower back  . Coronary artery disease   . Deformity, hand    left  . Depression   . Endometriosis    Lupron injection Q 3 mos.  . Fluid retention in legs   . Hyperlipidemia   . Hypertension   . Injury, brachial plexus    left  . Multiple allergies    takes allergy shots  . PONV (postoperative nausea and vomiting)   . 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 Wears partial dentures    upper partial    Past Surgical History:  Procedure Laterality Date  . ANKLE RECONSTRUCTION   02/01/2012   Procedure: RECONSTRUCTION ANKLE;  Surgeon: Wylene Simmer, MD;  Location: [place];  Service: Orthopedics;  Laterality: Right;  Excision of right calcaneonavicular coalition with autologus fat graft interposition  . BRACHIAL PLEXUS EXPLORATION    . CARDIAC CATHETERIZATION    . CORONARY STENT INTERVENTION N/A 07/15/2020   Procedure: CORONARY STENT INTERVENTION;  Surgeon: Jettie Booze, MD;  Location: [place] [facility];  Service: Cardiovascular;  Laterality: N/A;  . DIAGNOSTIC LAPAROSCOPY  10/02/2008   peritoneal bx.  Marland Kitchen LEFT HEART CATH AND CORONARY ANGIOGRAPHY N/A 07/15/2020   Procedure: LEFT HEART CATH AND CORONARY ANGIOGRAPHY;  Surgeon: Jettie Booze, MD;  Location: [place] [facility];  Service: Cardiovascular;  Laterality: N/A;  . MULTIPLE TOOTH EXTRACTIONS     upper teeth and wisdom teeth  . OVARIAN CYST REMOVAL  2000  . RIB RESECTION     left - cervical rib removal  . SHOULDER ARTHROSCOPY W/ ROTATOR CUFF REPAIR  07/2011   right  . SHOULDER SURGERY     left  . TOOTH EXTRACTION     x 1   Current Medications: Current Meds  Medication Sig  . acetaminophen (TYLENOL) 325 MG tablet Take 2 tablets (650 mg total) by mouth every 6 (  six) hours as needed.  Marland Kitchen albuterol (PROVENTIL HFA;VENTOLIN HFA) 108 (90 Base) MCG/ACT inhaler Inhale 2 puffs into the lungs every 4 (four) hours as needed for wheezing or shortness of breath (cough).  Marland Kitchen aspirin EC 81 MG EC tablet Take 1 tablet (81 mg total) by mouth daily. Swallow whole.  . beclomethasone (QVAR) 80 MCG/ACT inhaler Inhale 1 puff into the lungs 2 (two) times daily as needed (sob/wheezing).   [employer]) POWD as needed for constipation.  . clopidogrel (PLAVIX) 75 MG tablet TAKE 1 TABLET (75 MG TOTAL) BY MOUTH DAILY.  . cyclobenzaprine (FLEXERIL) 10 MG tablet Take 1 tablet (10 mg total) by mouth 2 (two) times daily as needed for muscle spasms.  Marland Kitchen diltiazem (CARDIZEM CD) 120 MG 24 hr capsule  TAKE 1 CAPSULE (120 MG TOTAL) BY MOUTH DAILY.  Marland Kitchen EPINEPHrine (EPIPEN JR) 0.15 MG/0.3ML injection Inject 0.15 mg into the muscle daily as needed for anaphylaxis.   Marland Kitchen ergocalciferol (VITAMIN D2) 50000 UNITS capsule Take 50,000 Units by mouth every Sunday. Sundays  . furosemide (LASIX) 20 MG tablet TAKE 1/2 TABLET(10 MG) BY MOUTH DAILY AS NEEDED FOR FLUID RETENTION OR SWELLING  . gabapentin (NEURONTIN) 600 MG tablet Take 600 mg by mouth at bedtime.  . hydrOXYzine (ATARAX/VISTARIL) 25 MG tablet Take 25 mg by mouth 3 (three) times daily as needed for anxiety or itching.   . isosorbide mononitrate (IMDUR) 30 MG 24 hr tablet TAKE 1 TABLET (30 MG TOTAL) BY MOUTH DAILY.  . metoprolol tartrate (LOPRESSOR) 25 MG tablet TAKE 1 TABLET (25 MG TOTAL) BY MOUTH 2 (TWO) TIMES DAILY.  . nitroGLYCERIN (NITROSTAT) 0.4 MG SL tablet PLACE 1 TABLET (0.4 MG TOTAL) UNDER THE TONGUE EVERY 5 (FIVE) MINUTES FOR 3 DOSES AS NEEDED FOR CHEST PAIN.  Marland Kitchen polyethylene glycol (MIRALAX / GLYCOLAX) 17 g packet Take 17 g by mouth every other day.  . potassium chloride SA (KLOR-CON) 20 MEQ tablet Take 20 mEq by mouth daily.  . rosuvastatin (CRESTOR) 40 MG tablet TAKE 1 TABLET (40 MG TOTAL) BY MOUTH DAILY.  Marland Kitchen senna (SENOKOT) 8.6 MG TABS tablet Take 1 tablet by mouth every other day.  . [DISCONTINUED] polyethylene glycol (MIRALAX) 17 g packet Take 17 g by mouth daily. (Patient taking differently: Take 17 g by mouth every other day.)     Allergies:   Pfizer-biontech covid-19 vacc [covid-19 mrna vacc (moderna)], Chocolate, Ivp dye [iodinated diagnostic agents], Latex, Shellfish allergy, Fish-derived products, Peanut-containing drug products, and Soap   Social History   Socioeconomic History  . Marital status: Single    Spouse name: Not on file  . Number of children: Not on file  . Years of education: 7  . Highest education level: Not on file  Occupational History  . Not on file  Tobacco Use  . Smoking status: Former Smoker     Packs/day: 1.00    Years: 20.00    Pack years: 20.00    Types: Cigarettes    Quit date: 07/14/2020    Years since quitting: 0.5  . Smokeless tobacco: Never Used  Vaping Use  . Vaping Use: Never used  Substance and Sexual Activity  . Alcohol use: No    Alcohol/week: 0.0 standard drinks    Comment: occasional  . Drug use: No  . Sexual activity: Yes    Partners: Male    Birth control/protection: None  Other Topics Concern  . Not on file  Social History Narrative  . Not on file  Social Determinants of Health   Financial Resource Strain: Not on file  Food Insecurity: Not on file  Transportation Needs: Not on file  Physical Activity: Not on file  Stress: Not on file  Social Connections: Not on file    Family History: The patient's family history includes Cancer in her mother; Dementia in her father; Stroke in her father.  ROS:   Please see the history of present illness.     All other systems reviewed and are negative.  EKGs/Labs/Other Studies Reviewed:    The following studies were reviewed today:  EKG:   09/21/20: Sinus rhythm rate 61 with QTc 440 07/19/2020: Sinus Rhythm 84 QTc 508  Recent Labs: 07/15/2020: TSH 0.618 09/21/2020: ALT 9 12/18/2020: B Natriuretic Peptide 163.1; BUN 9; Creatinine, Ser 0.78; Hemoglobin 12.2; Platelets 213; Potassium 3.6; Sodium 138  Recent Lipid Panel    Component Value Date/Time   CHOL 152 09/21/2020 0928   TRIG 49 09/21/2020 0928   HDL 67 09/21/2020 0928   CHOLHDL 2.3 09/21/2020 0928   CHOLHDL 3.1 07/15/2020 1730   VLDL 9 07/15/2020 1730   LDLCALC 74 09/21/2020 0928   Cardiac Event Monitoring: Date: 10/14/20 Results:  Patient had a minimum heart rate of 49 bpm, maximum heart rate of 174 bpm, and average heart rate of 76 bpm.  Predominant underlying rhythm was sinus rhythm.  One run of supraventricular tachycardia occurred lasting 4 beats at longest with a max rate of 174 bpm at fastest.  Isolated PACs were rare (<1.0%), with  rare couplets present.  Isolated PVCs were rare (<1.0%).  No evidence of complete heart block .  Triggered and diary events associated with sinus rhythm.   No malignant arrhythmias.   Transthoracic Echocardiogram: Date: 1/11.22 Results: Mild to moderate MR; possible posterior restriction IMPRESSIONS  1. Left ventricular ejection fraction, by estimation, is 55 to 60%. The  left ventricle has normal function. The left ventricle has no regional  wall motion abnormalities. Left ventricular diastolic parameters are  indeterminate.  2. Right ventricular systolic function is normal. The right ventricular  size is normal. There is normal pulmonary artery systolic pressure.  3. MR is eccentric, directed posterior into LA. The mitral valve is  normal in structure. Mild to moderate mitral valve regurgitation.  4. The aortic valve is normal in structure. Aortic valve regurgitation is  mild to moderate.   Left/Right Heart Catheterizations: Date:07/16/20 Results:  Prox RCA lesion is 50% stenosed. Vasospasm noted on this lesion which improved with IC NTG.  Mid RCA lesion is 99% stenosed. This was the culprit lesion which was ulcerated.  A drug-eluting stent was successfully placed using a STENT RESOLUTE ONYX 3.0X15, postdilated to 3.25 mm.  Post intervention, there is a 0% residual stenosis.  1st Mrg lesion is 80% stenosed.  A drug-eluting stent was successfully placed using a STENT RESOLUTE ONYX 2.5X18, postdilated to 3.0 mm.  Post intervention, there is a 0% residual stenosis.  Prox Cx lesion is 25% stenosed.  1st Diag lesion is 50% stenosed.  Prox LAD lesion is 25% stenosed.  The left ventricular systolic function is normal.  LV end diastolic pressure is moderately elevated.  The left ventricular ejection fraction is 55-65% by visual estimate.  There is no aortic valve stenosis.  Physical Exam:    VS:  BP (!) 120/58   Pulse (!) 55   Ht 4\' 11"  (1.499 m)   Wt 89.1  kg   LMP 07/12/2016 (Approximate) Comment: 2-3 day cycle  SpO2 98%  BMI 39.67 kg/m     Wt Readings from Last 3 Encounters:  01/24/21 89.1 kg  11/17/20 87.6 kg  11/02/20 86 kg    GEN: Well nourished, well developed in no acute distress HEENT: Normal NECK: No JVD LYMPHATICS: No lymphadenopathy CARDIAC: RRR, II/VI holosystolic murmur RESPIRATORY:  Clear to auscultation without rales, wheezing or rhonchi  ABDOMEN: Soft, non-tender, non-distended MUSCULOSKELETAL:  Non-pitting edema; No deformity  SKIN: Warm and dry NEUROLOGIC:  Alert and oriented x 3 PSYCHIATRIC:  Normal affect   ASSESSMENT:    1. Melena   2. Nonrheumatic mitral valve regurgitation   3. DOE (dyspnea on exertion)   4. Mixed hyperlipidemia   5. Coronary artery disease involving native coronary artery of native heart without angina pectoris   6. Essential hypertension    PLAN:    In order of problems listed above:  Preoperative Risk Assessment: Back injection - No further cardiac testing is recommended prior to surgery.  - The patient may proceed to surgery at acceptable risk (can take short 5 days off of DAPT)  Melana (stool pictures reviewed) Coronary Artery Disease; Obstructive and possible vasospasm component HLD HTN PVCs - asymptomatic - anatomy: 50% pRCA, mRCA PCI, OM1 PCI, pLCX 25%, D1 50%, pLAD 25% - continue ASA 81 mg; Plavix 75 mg PO daily - continue statin, goal LDL < 70; will recheck lipids LFTs at next appointment - continue BB - continue nitrates; Imdur 30 mg PO daily - Continue CCB: diltiazem 120 mg XL Daily - CBC and PC MD follow up  Mitral Vale Regurgitation - mild to moderate regurgitation - for now continue PRN lasix - BNP, and BMP; repeating echo early in the setting of new sx  November 2022 follow up unless new symptoms or abnormal test results warranting change in plan  Would be reasonable for  APP Follow up    Medication Adjustments/Labs and Tests Ordered: Current  medicines are reviewed at length with the patient today.  Concerns regarding medicines are outlined above.  Orders Placed This Encounter  Procedures  . CBC  . Basic metabolic panel  . Pro b natriuretic peptide (BNP)  . ECHOCARDIOGRAM COMPLETE   No orders of the defined types were placed in this encounter.   Patient Instructions  Medication Instructions:  Your physician recommends that you continue on your current medications as directed. Please refer to the Current Medication list given to you today.  *If you need a refill on your cardiac medications before your next appointment, please call your pharmacy*   Lab Work: TODAY: CBC, BNP, BMP If you have labs (blood work) drawn today and your tests are completely normal, you will receive your results only by: Marland Kitchen MyChart Message (if you have MyChart) OR . A paper copy in the mail If you have any lab test that is abnormal or we need to change your treatment, we will call you to review the results.   Testing/Procedures: Your physician has requested that you have an echocardiogram. Echocardiography is a painless test that uses sound waves to create images of your heart. It provides your doctor with information about the size and shape of your heart and how well your heart's chambers and valves are working. This procedure takes approximately one hour. There are no restrictions for this procedure.    Follow-Up: At [facility], you and your health needs are our priority.  As part of our continuing mission to provide you with exceptional heart care, we have created designated Provider Care Teams.  These Care Teams include your primary Cardiologist (physician) and Advanced Practice Providers (APPs -  Physician Assistants and Nurse Practitioners) who all work together to provide you with the care you need, when you need it.  We recommend signing up for the patient portal called "MyChart".  Sign up information is provided on this After Visit  Summary.  MyChart is used to connect with patients for Virtual Visits (Telemedicine).  Patients are able to view lab/test results, encounter notes, upcoming appointments, etc.  Non-urgent messages can be sent to your provider as well.   To learn more about what you can do with MyChart, go to [email or web address].    Your next appointment:   6 month(s)  The format for your next appointment:   In Person  Provider:   You may see Werner Lean, MD or one of the following Advanced Practice Providers on your designated Care Team:    Melina Copa, PA-C  Ermalinda Barrios, PA-C          Signed, Werner Lean, MD  01/24/2021 10:49 AM    [place]

## 2021-01-21 ENCOUNTER — Other Ambulatory Visit (HOSPITAL_COMMUNITY): Payer: Self-pay

## 2021-01-21 MED FILL — Metoprolol Tartrate Tab 25 MG: ORAL | 90 days supply | Qty: 180 | Fill #0 | Status: AC

## 2021-01-24 ENCOUNTER — Other Ambulatory Visit: Payer: Medicare Other | Admitting: *Deleted

## 2021-01-24 ENCOUNTER — Other Ambulatory Visit: Payer: Self-pay

## 2021-01-24 ENCOUNTER — Encounter: Payer: Self-pay | Admitting: Internal Medicine

## 2021-01-24 ENCOUNTER — Ambulatory Visit: Payer: Medicare Other | Admitting: Internal Medicine

## 2021-01-24 VITALS — BP 120/58 | HR 55 | Ht 59.0 in | Wt 196.4 lb

## 2021-01-24 DIAGNOSIS — I10 Essential (primary) hypertension: Secondary | ICD-10-CM

## 2021-01-24 DIAGNOSIS — K921 Melena: Secondary | ICD-10-CM | POA: Diagnosis not present

## 2021-01-24 DIAGNOSIS — R0600 Dyspnea, unspecified: Secondary | ICD-10-CM | POA: Diagnosis not present

## 2021-01-24 DIAGNOSIS — E782 Mixed hyperlipidemia: Secondary | ICD-10-CM | POA: Diagnosis not present

## 2021-01-24 DIAGNOSIS — R0609 Other forms of dyspnea: Secondary | ICD-10-CM | POA: Insufficient documentation

## 2021-01-24 DIAGNOSIS — I2510 Atherosclerotic heart disease of native coronary artery without angina pectoris: Secondary | ICD-10-CM | POA: Diagnosis not present

## 2021-01-24 DIAGNOSIS — I340 Nonrheumatic mitral (valve) insufficiency: Secondary | ICD-10-CM

## 2021-01-24 LAB — LIPID PANEL
Chol/HDL Ratio: 2 ratio (ref 0.0–4.4)
Cholesterol, Total: 172 mg/dL (ref 100–199)
HDL: 88 mg/dL (ref >39)
LDL Chol Calc (NIH): 74 mg/dL (ref 0–99)
Triglycerides: 45 mg/dL (ref 0–149)
VLDL Cholesterol Cal: 10 mg/dL (ref 5–40)

## 2021-01-24 LAB — HEPATIC FUNCTION PANEL
ALT: 9 IU/L (ref 0–32)
AST: 14 IU/L (ref 0–40)
Albumin: 4.4 g/dL (ref 3.8–4.9)
Alkaline Phosphatase: 66 IU/L (ref 44–121)
Bilirubin Total: 0.4 mg/dL (ref 0.0–1.2)
Bilirubin, Direct: 0.15 mg/dL (ref 0.00–0.40)
Total Protein: 7 g/dL (ref 6.0–8.5)

## 2021-01-24 NOTE — Patient Instructions (Addendum)
Medication Instructions:  Your physician recommends that you continue on your current medications as directed. Please refer to the Current Medication list given to you today.  *If you need a refill on your cardiac medications before your next appointment, please call your pharmacy*   Lab Work: TODAY: CBC, BNP, BMP If you have labs (blood work) drawn today and your tests are completely normal, you will receive your results only by: Marland Kitchen MyChart Message (if you have MyChart) OR . A paper copy in the mail If you have any lab test that is abnormal or we need to change your treatment, we will call you to review the results.   Testing/Procedures: Your physician has requested that you have an echocardiogram. Echocardiography is a painless test that uses sound waves to create images of your heart. It provides your doctor with information about the size and shape of your heart and how well your heart's chambers and valves are working. This procedure takes approximately one hour. There are no restrictions for this procedure.    Follow-Up: At [facility], you and your health needs are our priority.  As part of our continuing mission to provide you with exceptional heart care, we have created designated Provider Care Teams.  These Care Teams include your primary Cardiologist (physician) and Advanced Practice Providers (APPs -  Physician Assistants and Nurse Practitioners) who all work together to provide you with the care you need, when you need it.  We recommend signing up for the patient portal called "MyChart".  Sign up information is provided on this After Visit Summary.  MyChart is used to connect with patients for Virtual Visits (Telemedicine).  Patients are able to view lab/test results, encounter notes, upcoming appointments, etc.  Non-urgent messages can be sent to your provider as well.   To learn more about what you can do with MyChart, go to [email or web address].    Your next appointment:    6 month(s)  The format for your next appointment:   In Person  Provider:   You may see Werner Lean, MD or one of the following Advanced Practice Providers on your designated Care Team:    Melina Copa, PA-C  Ermalinda Barrios, PA-C

## 2021-01-25 LAB — BASIC METABOLIC PANEL
BUN/Creatinine Ratio: 8 — ABNORMAL LOW (ref 9–23)
BUN: 6 mg/dL (ref 6–24)
CO2: 23 mmol/L (ref 20–29)
Calcium: 9.2 mg/dL (ref 8.7–10.2)
Chloride: 104 mmol/L (ref 96–106)
Creatinine, Ser: 0.76 mg/dL (ref 0.57–1.00)
Glucose: 83 mg/dL (ref 65–99)
Potassium: 4.1 mmol/L (ref 3.5–5.2)
Sodium: 142 mmol/L (ref 134–144)
eGFR: 92 mL/min/{1.73_m2} (ref >59)

## 2021-01-25 LAB — CBC
Hematocrit: 38.6 % (ref 34.0–46.6)
Hemoglobin: 12.4 g/dL (ref 11.1–15.9)
MCH: 27.3 pg (ref 26.6–33.0)
MCHC: 32.1 g/dL (ref 31.5–35.7)
MCV: 85 fL (ref 79–97)
Platelets: 227 10*3/uL (ref 150–450)
RBC: 4.55 x10E6/uL (ref 3.77–5.28)
RDW: 12.2 % (ref 11.7–15.4)
WBC: 5.3 10*3/uL (ref 3.4–10.8)

## 2021-01-25 LAB — PRO B NATRIURETIC PEPTIDE: NT-Pro BNP: 144 pg/mL (ref 0–287)

## 2021-01-26 ENCOUNTER — Other Ambulatory Visit (HOSPITAL_COMMUNITY): Payer: Self-pay

## 2021-01-26 ENCOUNTER — Telehealth: Payer: Self-pay

## 2021-01-26 MED ORDER — EZETIMIBE 10 MG PO TABS
10.0000 mg | ORAL_TABLET | Freq: Every day | ORAL | 3 refills | Status: DC
  Filled 2021-01-26: qty 90, 90d supply, fill #0
  Filled 2021-04-25: qty 90, 90d supply, fill #1
  Filled 2021-08-12: qty 90, 90d supply, fill #2

## 2021-01-26 NOTE — Telephone Encounter (Signed)
Patient notified of results and MD recommendations.  F/U labs to be drawn at next OV pt is aware to schedule early morning appointment.  She is agreeable to plan, prescription sent to pharmacy of pt choice.

## 2021-01-26 NOTE — Telephone Encounter (Signed)
-----   Message from Werner Lean, MD sent at 01/26/2021  9:06 AM EDT ----- Results: LFTs WNL LDL near goal Plan: Start Zetia 10 mg PO Daily and check lipids and lfts at next visit  Werner Lean, MD

## 2021-01-28 DIAGNOSIS — K625 Hemorrhage of anus and rectum: Secondary | ICD-10-CM | POA: Diagnosis not present

## 2021-02-07 ENCOUNTER — Other Ambulatory Visit: Payer: Self-pay | Admitting: Physician Assistant

## 2021-02-07 MED FILL — Nitroglycerin SL Tab 0.4 MG: SUBLINGUAL | 10 days supply | Qty: 25 | Fill #0 | Status: AC

## 2021-02-08 ENCOUNTER — Other Ambulatory Visit (HOSPITAL_COMMUNITY): Payer: Self-pay

## 2021-02-09 ENCOUNTER — Other Ambulatory Visit (HOSPITAL_COMMUNITY): Payer: Self-pay

## 2021-02-09 MED ORDER — ROSUVASTATIN CALCIUM 40 MG PO TABS
40.0000 mg | ORAL_TABLET | Freq: Every day | ORAL | 3 refills | Status: DC
  Filled 2021-02-09: qty 90, 90d supply, fill #0
  Filled 2021-05-12: qty 90, 90d supply, fill #1
  Filled 2021-08-17: qty 90, 90d supply, fill #2
  Filled 2021-11-24: qty 90, 90d supply, fill #3

## 2021-02-09 MED FILL — Isosorbide Mononitrate Tab ER 24HR 30 MG: ORAL | 90 days supply | Qty: 90 | Fill #0 | Status: AC

## 2021-02-10 ENCOUNTER — Other Ambulatory Visit (HOSPITAL_COMMUNITY): Payer: Self-pay

## 2021-02-11 ENCOUNTER — Other Ambulatory Visit (HOSPITAL_COMMUNITY): Payer: Self-pay

## 2021-02-14 DIAGNOSIS — R079 Chest pain, unspecified: Secondary | ICD-10-CM | POA: Diagnosis not present

## 2021-02-14 DIAGNOSIS — R0602 Shortness of breath: Secondary | ICD-10-CM | POA: Diagnosis not present

## 2021-02-14 DIAGNOSIS — R0789 Other chest pain: Secondary | ICD-10-CM | POA: Diagnosis not present

## 2021-02-14 DIAGNOSIS — I10 Essential (primary) hypertension: Secondary | ICD-10-CM | POA: Diagnosis not present

## 2021-02-24 ENCOUNTER — Other Ambulatory Visit: Payer: Self-pay

## 2021-02-24 ENCOUNTER — Ambulatory Visit (HOSPITAL_COMMUNITY): Payer: Medicare Other | Attending: Cardiology

## 2021-02-24 DIAGNOSIS — R0609 Other forms of dyspnea: Secondary | ICD-10-CM

## 2021-02-24 DIAGNOSIS — R0600 Dyspnea, unspecified: Secondary | ICD-10-CM | POA: Diagnosis not present

## 2021-02-24 DIAGNOSIS — I340 Nonrheumatic mitral (valve) insufficiency: Secondary | ICD-10-CM | POA: Insufficient documentation

## 2021-02-24 LAB — ECHOCARDIOGRAM COMPLETE
Area-P 1/2: 3.12 cm2
P 1/2 time: 399 msec
S' Lateral: 2.7 cm

## 2021-02-25 ENCOUNTER — Telehealth: Payer: Self-pay | Admitting: Internal Medicine

## 2021-02-25 NOTE — Telephone Encounter (Signed)
Returned pt call in regards to results.  I left a message letting her know that I can see that she has seen results on My Chart and if she has any questions to call the office.

## 2021-02-25 NOTE — Telephone Encounter (Signed)
Patient is returning call to discuss echo results. °

## 2021-02-25 NOTE — Telephone Encounter (Signed)
Patient called back in I reviewed results with her.  She expresses that she has BLE swelling.  She reports that she does not wear compressions socks.  She has also eaten a bit more salt then normal.  She has had 2 fast food burgers in the last week.  She has furosemide 10 mg  ordered PRN/ swelling.  She has taken 20 mg PO QD for at least the past 2 weeks with little results.   She also complained of her heart racing and she takes NTG to help it.  I advised her that NTG is used for CP not fast HR.  She reports that she does have anxiety. Per pt she takes her imdur, lopressor, and diltiazem as ordered.  I asked that she check BP and HR daily for a week and send in readings.  I also suggested that she try to improve her diet by decreasing the amount of salt that she consumes.

## 2021-02-25 NOTE — Telephone Encounter (Signed)
Patient returning call.

## 2021-02-28 DIAGNOSIS — H35033 Hypertensive retinopathy, bilateral: Secondary | ICD-10-CM | POA: Diagnosis not present

## 2021-03-17 ENCOUNTER — Other Ambulatory Visit (HOSPITAL_COMMUNITY): Payer: Self-pay

## 2021-03-17 MED FILL — Furosemide Tab 20 MG: ORAL | 90 days supply | Qty: 45 | Fill #1 | Status: CN

## 2021-03-17 MED FILL — Clopidogrel Bisulfate Tab 75 MG (Base Equiv): ORAL | 90 days supply | Qty: 90 | Fill #1 | Status: AC

## 2021-03-18 ENCOUNTER — Other Ambulatory Visit (HOSPITAL_COMMUNITY): Payer: Self-pay

## 2021-03-18 MED FILL — Furosemide Tab 20 MG: ORAL | 90 days supply | Qty: 45 | Fill #1 | Status: CN

## 2021-03-24 ENCOUNTER — Other Ambulatory Visit: Payer: Self-pay | Admitting: Physician Assistant

## 2021-03-24 ENCOUNTER — Ambulatory Visit
Admission: RE | Admit: 2021-03-24 | Discharge: 2021-03-24 | Disposition: A | Discharge status: Home / Self Care | Payer: Medicare Other | Source: Ambulatory Visit | Attending: Physician Assistant | Admitting: Physician Assistant

## 2021-03-24 DIAGNOSIS — R52 Pain, unspecified: Secondary | ICD-10-CM

## 2021-03-24 DIAGNOSIS — Z23 Encounter for immunization: Secondary | ICD-10-CM | POA: Diagnosis not present

## 2021-03-24 DIAGNOSIS — M25562 Pain in left knee: Secondary | ICD-10-CM | POA: Diagnosis not present

## 2021-03-24 DIAGNOSIS — I10 Essential (primary) hypertension: Secondary | ICD-10-CM | POA: Diagnosis not present

## 2021-03-24 DIAGNOSIS — E785 Hyperlipidemia, unspecified: Secondary | ICD-10-CM | POA: Diagnosis not present

## 2021-03-24 DIAGNOSIS — G8929 Other chronic pain: Secondary | ICD-10-CM | POA: Diagnosis not present

## 2021-03-24 DIAGNOSIS — M25561 Pain in right knee: Secondary | ICD-10-CM | POA: Diagnosis not present

## 2021-03-24 DIAGNOSIS — I252 Old myocardial infarction: Secondary | ICD-10-CM | POA: Diagnosis not present

## 2021-03-28 ENCOUNTER — Other Ambulatory Visit (HOSPITAL_COMMUNITY): Payer: Self-pay

## 2021-03-28 MED FILL — Nitroglycerin SL Tab 0.4 MG: SUBLINGUAL | 10 days supply | Qty: 25 | Fill #1 | Status: AC

## 2021-03-28 MED FILL — Furosemide Tab 20 MG: ORAL | 90 days supply | Qty: 45 | Fill #1 | Status: AC

## 2021-04-08 ENCOUNTER — Other Ambulatory Visit (HOSPITAL_COMMUNITY): Payer: Self-pay

## 2021-04-13 ENCOUNTER — Other Ambulatory Visit (HOSPITAL_COMMUNITY): Payer: Self-pay

## 2021-04-15 ENCOUNTER — Other Ambulatory Visit (HOSPITAL_COMMUNITY): Payer: Self-pay

## 2021-04-15 MED FILL — Diltiazem HCl Coated Beads Cap ER 24HR 120 MG: ORAL | 90 days supply | Qty: 90 | Fill #1 | Status: AC

## 2021-04-19 ENCOUNTER — Other Ambulatory Visit (HOSPITAL_COMMUNITY): Payer: Self-pay

## 2021-04-25 ENCOUNTER — Encounter (HOSPITAL_COMMUNITY): Payer: Self-pay

## 2021-04-25 ENCOUNTER — Other Ambulatory Visit: Payer: Self-pay

## 2021-04-25 ENCOUNTER — Ambulatory Visit (HOSPITAL_COMMUNITY)
Admission: EM | Admit: 2021-04-25 | Discharge: 2021-04-25 | Disposition: A | Discharge status: Home / Self Care | Payer: Medicare Other | Attending: Sports Medicine | Admitting: Sports Medicine

## 2021-04-25 DIAGNOSIS — Z113 Encounter for screening for infections with a predominantly sexual mode of transmission: Secondary | ICD-10-CM | POA: Diagnosis present

## 2021-04-25 DIAGNOSIS — L989 Disorder of the skin and subcutaneous tissue, unspecified: Secondary | ICD-10-CM | POA: Diagnosis not present

## 2021-04-25 DIAGNOSIS — N898 Other specified noninflammatory disorders of vagina: Secondary | ICD-10-CM

## 2021-04-25 DIAGNOSIS — Z20822 Contact with and (suspected) exposure to covid-19: Secondary | ICD-10-CM | POA: Diagnosis not present

## 2021-04-25 LAB — POC URINE PREG, ED: Preg Test, Ur: NEGATIVE

## 2021-04-25 LAB — HIV ANTIBODY (ROUTINE TESTING W REFLEX): HIV Screen 4th Generation wRfx: NONREACTIVE

## 2021-04-25 MED ORDER — MUPIROCIN CALCIUM 2 % EX CREA: 1.0000 "application " | TOPICAL_CREAM | Freq: Two times a day (BID) | CUTANEOUS | 0 refills | Status: AC

## 2021-04-25 NOTE — Discharge Instructions (Addendum)
Apply mupirocin ointment twice daily to the affected area  We will call you regarding your lab results  Monitor for COVID symptoms in the next 3 to 7 days.

## 2021-04-25 NOTE — ED Triage Notes (Addendum)
Patient states she would like to be tested for Covid d/t exposure. Pt also states bump to abd that might be a possible spider bite (started 4 days ago). Pt also wants to be tested for all STDs (has odor).

## 2021-04-25 NOTE — ED Provider Notes (Signed)
Wendy [name]    CSN: [id number] Arrival date & time: 04/25/21  1621      History   Chief Complaint Chief Complaint  Patient presents with   Insect Bite    HPI Wendy [name] is a 55 y.o. female here for multiple concerns.  HPI  Lesion on right abdomen -she noticed a small lesion with an opening in the skin that look like a bug bite about 1 week ago.  She has tried washing with soap and water, applying Vaseline and alcohol swab.  It is only tender to palpation.  There is a small area of redness around it that has grown over the last few days.  She denies any drainage or discharge from this area.  No warm to the area.  She is unsure if she had a bite, but denies any scrapes or trauma to the area that she knows of.  She also reports that she had a close exposure to a patient who tested positive for COVID.  She states she interacted with him for about 20 minutes over the weekend Saturday night.  The patient herself is completely asymptomatic without fever or chills, fatigue, headache, chest pain, shortness of breath, nasal congestion or rhinorrhea, no cough.  He is inquiring about testing today.  Desire for STI testing.  The patient reports that she was called by another woman from her previous partner that states she had been having unprotected sex with this female as well.  The patient would like STI testing today.  She denies any symptoms.  She denies any  bleeding or abnormal discharge.  Dysuria or pelvic pain.  She does report an unusual vaginal odor, but but this has been going on for the last days to weeks.  She does have a history of bacterial vaginosis.  Past Medical History:  Diagnosis Date   Anxiety    Asthma    daily and prn inhalers   Cervical disc disease    decreased range of motion   Chronic pain    shoulders, neck, lower back   Coronary artery disease    Deformity, hand    left   Depression    Endometriosis    Lupron injection Q 3 mos.   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   Wears partial dentures    upper partial    Patient Active Problem List   Diagnosis Date Noted   Melena 01/24/2021   DOE (dyspnea on exertion) 01/24/2021   PVC (premature ventricular contraction) 10/14/2020   Coronary artery disease involving native coronary artery of native heart without angina pectoris 07/30/2020   Essential hypertension 07/30/2020   Hyperlipidemia 07/30/2020   Gastroesophageal reflux disease without esophagitis 07/30/2020   Nonrheumatic mitral valve regurgitation 07/30/2020   Prolonged Q-T interval on ECG 07/30/2020   NSTEMI (non-ST elevated myocardial infarction) ([place]) 07/15/2020   BV (bacterial vaginosis) 03/06/2013   ANXIETY 11/05/2007   DEPRESSION 11/05/2007   NAUSEA, CHRONIC 11/05/2007   FIBROIDS, UTERUS 03/26/2007   MIGRAINE, COMMON 03/26/2007   OVARIAN CYST 03/26/2007   DEGENERATIVE [place] DISEASE, CERVICAL SPINE 03/26/2007   DEGENERATIVE [place] DISEASE, LUMBOSACRAL SPINE 03/26/2007   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    Past Surgical History:  Procedure Laterality Date  ANKLE RECONSTRUCTION  02/01/2012   Procedure: RECONSTRUCTION ANKLE;  Surgeon: Wylene Simmer, MD;  Location: [facility];  Service: Orthopedics;  Laterality: Right;  Excision of right calcaneonavicular coalition with autologus fat graft interposition   BRACHIAL PLEXUS EXPLORATION     CARDIAC CATHETERIZATION     CORONARY STENT INTERVENTION N/A 07/15/2020   Procedure: CORONARY STENT INTERVENTION;  Surgeon: Jettie Booze, MD;  Location: [facility];  Service: Cardiovascular;  Laterality: N/A;   DIAGNOSTIC LAPAROSCOPY  10/02/2008   peritoneal bx.   LEFT HEART CATH AND CORONARY ANGIOGRAPHY N/A 07/15/2020   Procedure: LEFT HEART CATH AND  CORONARY ANGIOGRAPHY;  Surgeon: Jettie Booze, MD;  Location: [facility];  Service: Cardiovascular;  Laterality: N/A;   MULTIPLE TOOTH EXTRACTIONS     upper teeth and wisdom teeth   OVARIAN CYST REMOVAL  2000   RIB RESECTION     left - cervical rib removal   SHOULDER ARTHROSCOPY W/ ROTATOR CUFF REPAIR  07/2011   right   SHOULDER SURGERY     left   TOOTH EXTRACTION     x 1    OB History     Gravida  0   Para  0   Term  0   Preterm  0   AB  0   Living  0      SAB  0   IAB  0   Ectopic  0   Multiple  0   Live Births               Home Medications    Prior to Admission medications   Medication Sig Start Date End Date Taking? Authorizing Provider  albuterol (PROVENTIL HFA;VENTOLIN HFA) 108 (90 Base) MCG/ACT inhaler Inhale 2 puffs into the lungs every 4 (four) hours as needed for wheezing or shortness of breath (cough). 11/20/16  Yes Street, [place], PA-C  aspirin EC 81 MG EC tablet Take 1 tablet (81 mg total) by mouth daily. Swallow whole. 07/17/20  Yes Bhagat, Bhavinkumar, PA  beclomethasone (QVAR) 80 MCG/ACT inhaler Inhale 1 puff into the lungs 2 (two) times daily as needed (sob/wheezing).    Yes [provider]  clopidogrel (PLAVIX) 75 MG tablet TAKE 1 TABLET (75 MG TOTAL) BY MOUTH DAILY. 09/21/20 09/21/21 Yes Chandrasekhar, Mahesh A, MD  diltiazem (CARDIZEM CD) 120 MG 24 hr capsule TAKE 1 CAPSULE (120 MG TOTAL) BY MOUTH DAILY. 10/14/20 10/14/21 Yes Chandrasekhar, Mahesh A, MD  ergocalciferol (VITAMIN D2) 50000 UNITS capsule Take 50,000 Units by mouth every Sunday. Sundays   Yes [provider]  furosemide (LASIX) 20 MG tablet TAKE 1/2 TABLET(10 MG) BY MOUTH DAILY AS NEEDED FOR FLUID RETENTION OR SWELLING 11/08/20 11/08/21 Yes Chandrasekhar, Mahesh A, MD  gabapentin (NEURONTIN) 600 MG tablet Take 600 mg by mouth at bedtime.   Yes [provider]  hydrOXYzine (ATARAX/VISTARIL) 25 MG tablet Take 25 mg by mouth 3 (three) times  daily as needed for anxiety or itching.  10/09/19  Yes [provider]  isosorbide mononitrate (IMDUR) 30 MG 24 hr tablet TAKE 1 TABLET (30 MG TOTAL) BY MOUTH DAILY. 11/08/20 11/08/21 Yes Chandrasekhar, Mahesh A, MD  metoprolol tartrate (LOPRESSOR) 25 MG tablet TAKE 1 TABLET (25 MG TOTAL) BY MOUTH 2 (TWO) TIMES DAILY. 09/27/20 09/27/21 Yes Chandrasekhar, Mahesh A, MD  mupirocin cream (BACTROBAN) 2 % Apply 1 application topically 2 (two) times daily for 7 days. 04/25/21 05/02/21 Yes Elba Barman, DO  potassium chloride SA (KLOR-CON)  20 MEQ tablet Take 20 mEq by mouth daily.   Yes [provider]  rosuvastatin (CRESTOR) 40 MG tablet Take 1 tablet (40 mg total) by mouth daily. 02/09/21  Yes Chandrasekhar, Mahesh A, MD  senna (SENOKOT) 8.6 MG TABS tablet Take 1 tablet by mouth every other day.   Yes [provider]  acetaminophen (TYLENOL) 325 MG tablet Take 2 tablets (650 mg total) by mouth every 6 (six) hours as needed. 10/01/20   Mesner, Corene Cornea, MD  [employer] [facility]) POWD as needed for constipation.    [provider]  cyclobenzaprine (FLEXERIL) 10 MG tablet Take 1 tablet (10 mg total) by mouth 2 (two) times daily as needed for muscle spasms. 10/01/20   Mesner, Corene Cornea, MD  EPINEPHrine (EPIPEN JR) 0.15 MG/0.3ML injection Inject 0.15 mg into the muscle daily as needed for anaphylaxis.     [provider]  ezetimibe (ZETIA) 10 MG tablet Take 1 tablet (10 mg total) by mouth daily. 01/26/21   Chandrasekhar, Mahesh A, MD  nitroGLYCERIN (NITROSTAT) 0.4 MG SL tablet PLACE 1 TABLET (0.4 MG TOTAL) UNDER THE TONGUE EVERY 5 (FIVE) MINUTES FOR 3 DOSES AS NEEDED FOR CHEST PAIN. 07/16/20 07/16/21  Bhagat, Crista Luria, PA  polyethylene glycol (MIRALAX / GLYCOLAX) 17 g packet Take 17 g by mouth every other day.    [provider]    Family History Family History  Problem Relation Age of Onset   Cancer Mother    Dementia Father    Stroke Father     Social  History Social History   Tobacco Use   Smoking status: Former    Packs/day: 1.00    Years: 20.00    Pack years: 20.00    Types: Cigarettes    Quit date: 07/14/2020    Years since quitting: 0.7   Smokeless tobacco: Never  Vaping Use   Vaping Use: Never used  Substance Use Topics   Alcohol use: No    Alcohol/week: 0.0 standard drinks    Comment: occasional   Drug use: No     Allergies   Pfizer-biontech covid-19 vacc [covid-19 mrna vacc (moderna)], Chocolate, Ivp dye [iodinated diagnostic agents], Latex, Shellfish allergy, Fish-derived products, Peanut-containing drug products, and Soap   Review of Systems Review of Systems  Constitutional:  Negative for chills, fatigue and fever.  HENT:  Negative for congestion.   Respiratory:  Negative for cough and shortness of breath.   Cardiovascular:  Negative for chest pain.  Skin:  Positive for color change and rash.  Hematological:  Negative for adenopathy.    Physical Exam Triage Vital Signs ED Triage Vitals  Enc Vitals Group     BP 04/25/21 1737 122/74     Pulse Rate 04/25/21 1737 72     Resp 04/25/21 1737 18     Temp 04/25/21 1737 98 F (36.7 C)     Temp Source 04/25/21 1737 Oral     SpO2 04/25/21 1737 98 %     Weight --      Height --      Head Circumference --      Peak Flow --      Pain Score 04/25/21 1731 0     Pain Loc --      Pain Edu? --      Excl. in [place]? --    No data found.  Updated Vital Signs BP 122/74 (BP Location: Right Arm)   Pulse 72   Temp 98 F (36.7 C) (Oral)  Resp 18   LMP 07/12/2016 (Approximate) Comment: 2-3 day cycle  SpO2 98%    Physical Exam Gen: Well-appearing, in no acute distress; non-toxic CV: Regular Rate. Well-perfused. Warm.  Resp: Breathing unlabored on room air; no wheezing. Psych: Fluid speech in conversation; appropriate affect; normal thought process Neuro: Sensation intact throughout. No gross coordination deficits.  Skin: There is a small approximately 1.5 x 2 cm  circular lesion on her left abdomen.  There is a small area of surrounding erythema, no endurance or warmth to palpation.  There is a very small punctate lesion, that could represent a previous insect bite.  No drainage or pus noted.  No abnormal odor coming from the area.    UC Treatments / Results  Labs (all labs ordered are listed, but only abnormal results are displayed) Labs Reviewed  HIV ANTIBODY (ROUTINE TESTING W REFLEX)  RPR  POC URINE PREG, ED  CERVICOVAGINAL ANCILLARY ONLY    EKG   Radiology No results found.  Procedures Procedures (including critical care time)  Medications Ordered in UC Medications - No data to display  Initial Impression / Assessment and Plan / UC Course  I have reviewed the triage vital signs and the nursing notes.  Pertinent labs & imaging results that were available during my care of the patient were reviewed by me and considered in my medical decision making (see chart for details).     1.) Skin lesion - small area (1.5 x 2cm) of erythema, likely a bug bite that has surrounding erythema. Will apply Mupirocin ointment BID x 7 days. Skin hygiene discussed, return precautions given.  2. STI screening  3. Vaginal odor - pt reported unusual vaginal odor x days-weeks. Patient request STI screening. Gon/Chlam, HIV, RPR testing today. Self-swab testing for BV, Trich, Yeast vaginitis today. Will call with results. Encouraged safe protected sex habits.  4. Possible covid-19 exposure -patient is completely asymptomatic and had exposure on about 36 hours ago.  Explained to her that even if COVID was transmitted to her, her test would likely not be positive at this time.  Discussed warning symptoms and signs to look for over the next few days.  CDC guidelines discussed with patient today. Final Clinical Impressions(s) / UC Diagnoses   Final diagnoses:  Skin lesion  Routine screening for STI (sexually transmitted infection)  Close exposure to COVID-19  virus  Vaginal odor     Discharge Instructions      Apply mupirocin ointment twice daily to the affected area  We will call you regarding your lab results  Monitor for COVID symptoms in the next 3 to 7 days.      ED Prescriptions     Medication Sig Dispense Auth. Provider   mupirocin cream (BACTROBAN) 2 % Apply 1 application topically 2 (two) times daily for 7 days. 15 g Elba Barman, DO      PDMP not reviewed this encounter.   Elba Barman, DO 04/25/21 1830

## 2021-04-26 ENCOUNTER — Other Ambulatory Visit (HOSPITAL_COMMUNITY): Payer: Self-pay

## 2021-04-26 ENCOUNTER — Other Ambulatory Visit: Payer: Self-pay | Admitting: Family Medicine

## 2021-04-26 LAB — CERVICOVAGINAL ANCILLARY ONLY
Bacterial Vaginitis (gardnerella): NEGATIVE
Candida Glabrata: NEGATIVE
Candida Vaginitis: NEGATIVE
Chlamydia: NEGATIVE
Comment: NEGATIVE
Comment: NEGATIVE
Comment: NEGATIVE
Comment: NEGATIVE
Comment: NEGATIVE
Comment: NORMAL
Neisseria Gonorrhea: NEGATIVE
Trichomonas: NEGATIVE

## 2021-04-26 LAB — RPR: RPR Ser Ql: NONREACTIVE

## 2021-05-11 ENCOUNTER — Other Ambulatory Visit (HOSPITAL_COMMUNITY): Payer: Self-pay

## 2021-05-11 MED FILL — Nitroglycerin SL Tab 0.4 MG: SUBLINGUAL | 10 days supply | Qty: 25 | Fill #2 | Status: AC

## 2021-05-12 ENCOUNTER — Other Ambulatory Visit (HOSPITAL_COMMUNITY): Payer: Self-pay

## 2021-05-12 MED FILL — Isosorbide Mononitrate Tab ER 24HR 30 MG: ORAL | 90 days supply | Qty: 90 | Fill #1 | Status: AC

## 2021-05-27 DIAGNOSIS — U071 COVID-19: Secondary | ICD-10-CM | POA: Diagnosis not present

## 2021-06-02 ENCOUNTER — Other Ambulatory Visit (HOSPITAL_COMMUNITY): Payer: Self-pay

## 2021-06-02 MED FILL — Metoprolol Tartrate Tab 25 MG: ORAL | 90 days supply | Qty: 180 | Fill #1 | Status: AC

## 2021-06-03 DIAGNOSIS — M722 Plantar fascial fibromatosis: Secondary | ICD-10-CM | POA: Diagnosis not present

## 2021-06-20 ENCOUNTER — Other Ambulatory Visit (HOSPITAL_COMMUNITY): Payer: Self-pay

## 2021-06-20 MED FILL — Clopidogrel Bisulfate Tab 75 MG (Base Equiv): ORAL | 90 days supply | Qty: 90 | Fill #2 | Status: AC

## 2021-07-13 ENCOUNTER — Other Ambulatory Visit (HOSPITAL_COMMUNITY): Payer: Self-pay

## 2021-07-13 DIAGNOSIS — W57XXXA Bitten or stung by nonvenomous insect and other nonvenomous arthropods, initial encounter: Secondary | ICD-10-CM | POA: Diagnosis not present

## 2021-07-13 DIAGNOSIS — M722 Plantar fascial fibromatosis: Secondary | ICD-10-CM | POA: Diagnosis not present

## 2021-07-13 DIAGNOSIS — S90861A Insect bite (nonvenomous), right foot, initial encounter: Secondary | ICD-10-CM | POA: Diagnosis not present

## 2021-07-13 DIAGNOSIS — M7701 Medial epicondylitis, right elbow: Secondary | ICD-10-CM | POA: Diagnosis not present

## 2021-07-13 MED ORDER — TRIAMCINOLONE ACETONIDE 0.1 % EX OINT
1.0000 "application " | TOPICAL_OINTMENT | Freq: Two times a day (BID) | CUTANEOUS | 0 refills | Status: DC
  Filled 2021-07-13: qty 80, 14d supply, fill #0

## 2021-07-15 ENCOUNTER — Other Ambulatory Visit (HOSPITAL_COMMUNITY): Payer: Self-pay

## 2021-07-15 IMAGING — DX DG CHEST 2V
2 series · 2 of 2 positions shown · non-contrast
Comparison: Chest radiograph dated 10/08/2020.

CLINICAL DATA: 54-year-old female with chest pain.

EXAM:
CHEST - 2 VIEW

[chest pa]
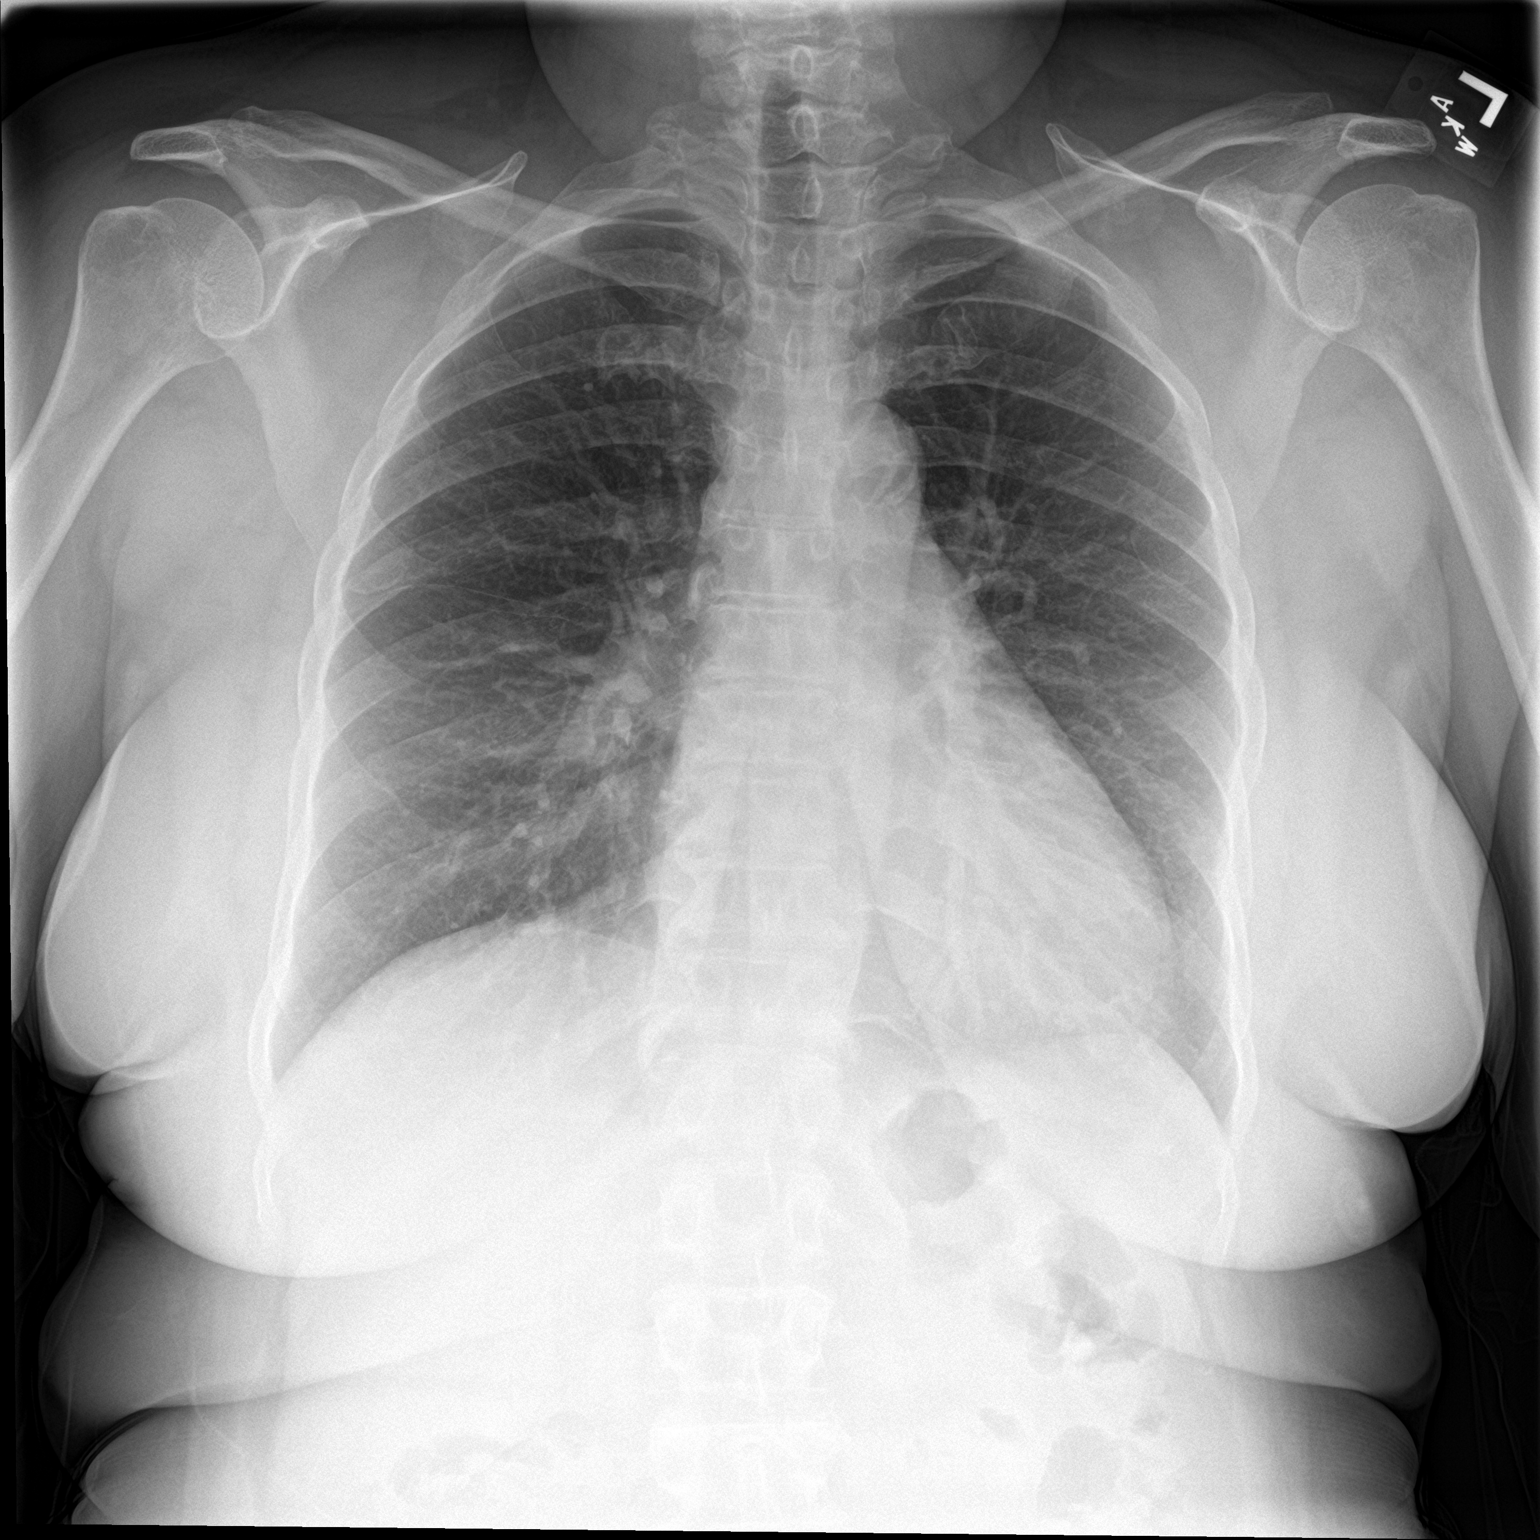

[chest lat]
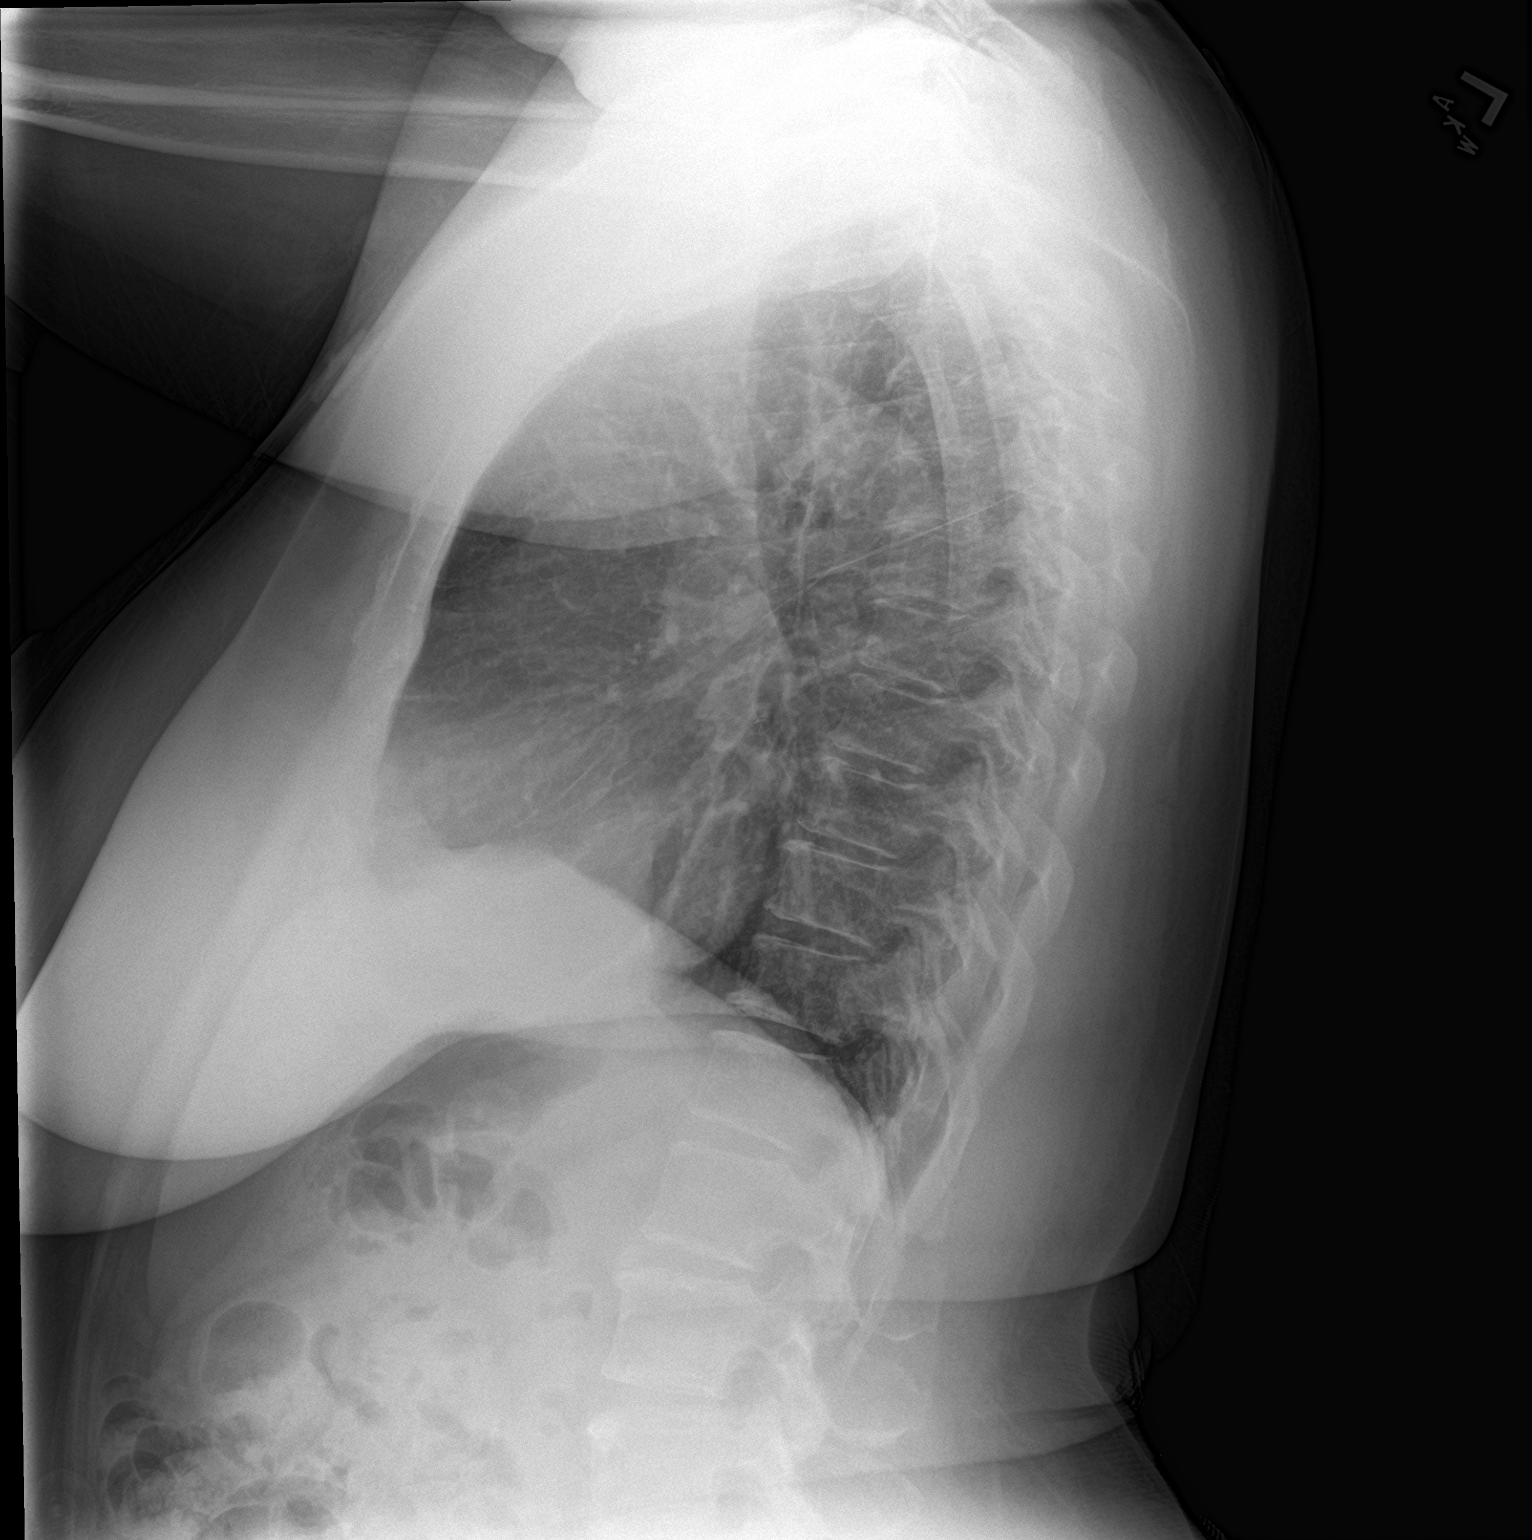

[2 of 2 positions shown; findings below may reference images not displayed]

FINDINGS: No focal consolidation, pleural effusion, pneumothorax. The cardiac
silhouette is within limits. No acute osseous pathology.
IMPRESSION: No active cardiopulmonary disease.

## 2021-07-15 MED FILL — Nitroglycerin SL Tab 0.4 MG: SUBLINGUAL | 10 days supply | Qty: 25 | Fill #3 | Status: AC

## 2021-08-11 ENCOUNTER — Other Ambulatory Visit: Payer: Self-pay | Admitting: Physician Assistant

## 2021-08-11 ENCOUNTER — Other Ambulatory Visit (HOSPITAL_COMMUNITY): Payer: Self-pay

## 2021-08-11 MED ORDER — NITROGLYCERIN 0.4 MG SL SUBL
SUBLINGUAL_TABLET | SUBLINGUAL | 5 refills | Status: DC
  Filled 2021-08-11: qty 25, 5d supply, fill #0
  Filled 2021-09-27: qty 25, 5d supply, fill #1
  Filled 2021-12-06: qty 25, 5d supply, fill #2
  Filled 2022-02-01: qty 25, 5d supply, fill #3
  Filled 2022-03-24: qty 25, 5d supply, fill #4
  Filled 2022-04-24: qty 25, 5d supply, fill #5

## 2021-08-12 ENCOUNTER — Other Ambulatory Visit (HOSPITAL_COMMUNITY): Payer: Self-pay

## 2021-08-12 MED ORDER — GABAPENTIN 600 MG PO TABS
600.0000 mg | ORAL_TABLET | Freq: Every day | ORAL | 2 refills | Status: DC
  Filled 2021-08-12: qty 90, 90d supply, fill #0

## 2021-08-14 NOTE — Progress Notes (Signed)
.  Cardiology Office Note:    Date:  08/15/2021   ID:  Wendy [name], DOB [birth year], MRN [id number]  PCP:  Johna Roles, PA  [facility] Cardiologist:  Werner Lean, MD  [facility] Electrophysiologist:  None   Referring MD: Johna Roles, PA   CC: Follow up visit  History of Present Illness:    Wendy [name] is a 55 y.o. female with a hx of CAD (NSTEMI 07/16/20 with mRCA DES, 50% pRCA; OM1 PCI, D1 50%) with preserved EF, HTN, HLD, Former Tobacco Use, GERD, who presented 07/15/20 for NSTEMI. Seen 09/21/2020 with PVCs and ZioPatch was placed.  In interim, see 10/01/20 and 10/08/20 with chest pain; no change in novel biomarkers.  Had some melena that has resolved. Seen 08/15/21   Patient notes that she is doing well.  Notes that she is overweight and is trying to exercise but has been having joint pain.  There are no interval hospital/ED visit.    No chest pain or pressure .  No SOB.  Has DOE going up stairs.  No PND/Orthopnea.  No weight gain or leg swelling.  No palpitations or syncope.  Continues to be smoke free.   Past Medical History:  Diagnosis Date   Anxiety    Asthma    daily and prn inhalers   Cervical disc disease    decreased range of motion   Chronic pain    shoulders, neck, lower back   Coronary artery disease    Deformity, hand    left   Depression    Endometriosis    Lupron injection Q 3 mos.   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   Wears partial dentures    upper partial    Past Surgical History:  Procedure Laterality Date   ANKLE RECONSTRUCTION  02/01/2012   Procedure: RECONSTRUCTION ANKLE;  Surgeon: Wylene Simmer, MD;  Location: [place];  Service: Orthopedics;  Laterality: Right;  Excision of right  calcaneonavicular coalition with autologus fat graft interposition   BRACHIAL PLEXUS EXPLORATION     CARDIAC CATHETERIZATION     CORONARY STENT INTERVENTION N/A 07/15/2020   Procedure: CORONARY STENT INTERVENTION;  Surgeon: Jettie Booze, MD;  Location: [place] [facility];  Service: Cardiovascular;  Laterality: N/A;   DIAGNOSTIC LAPAROSCOPY  10/02/2008   peritoneal bx.   LEFT HEART CATH AND CORONARY ANGIOGRAPHY N/A 07/15/2020   Procedure: LEFT HEART CATH AND CORONARY ANGIOGRAPHY;  Surgeon: Jettie Booze, MD;  Location: [place] [facility];  Service: Cardiovascular;  Laterality: N/A;   MULTIPLE TOOTH EXTRACTIONS     upper teeth and wisdom teeth   OVARIAN CYST REMOVAL  2000   RIB RESECTION     left - cervical rib removal   SHOULDER ARTHROSCOPY W/ ROTATOR CUFF REPAIR  07/2011   right   SHOULDER SURGERY     left   TOOTH EXTRACTION     x 1   Current Medications: Current Meds  Medication Sig   acetaminophen (TYLENOL) 325 MG tablet Take 2 tablets (650 mg total) by mouth every 6 (six) hours as needed.   albuterol (PROVENTIL HFA;VENTOLIN HFA) 108 (90 Base) MCG/ACT inhaler Inhale 2 puffs into the lungs every 4 (  four) hours as needed for wheezing or shortness of breath (cough).   aspirin EC 81 MG EC tablet Take 1 tablet (81 mg total) by mouth daily. Swallow whole.   beclomethasone (QVAR) 80 MCG/ACT inhaler Inhale 1 puff into the lungs 2 (two) times daily as needed (sob/wheezing).    [employer] (POLYOX WSR-301) POWD as needed for constipation.   cyclobenzaprine (FLEXERIL) 10 MG tablet Take 1 tablet (10 mg total) by mouth 2 (two) times daily as needed for muscle spasms.   diltiazem (CARDIZEM CD) 120 MG 24 hr capsule TAKE 1 CAPSULE (120 MG TOTAL) BY MOUTH DAILY.   EPINEPHrine (EPIPEN JR) 0.15 MG/0.3ML injection Inject 0.15 mg into the muscle daily as needed for anaphylaxis.    ergocalciferol (VITAMIN D2) 50000 UNITS capsule Take 50,000 Units by mouth every Sunday. Sundays   ezetimibe  (ZETIA) 10 MG tablet Take 1 tablet (10 mg total) by mouth daily.   fluticasone (FLOVENT HFA) 44 MCG/ACT inhaler as needed.   furosemide (LASIX) 20 MG tablet TAKE 1/2 TABLET(10 MG) BY MOUTH DAILY AS NEEDED FOR FLUID RETENTION OR SWELLING   gabapentin (NEURONTIN) 600 MG tablet Take 600 mg by mouth at bedtime.   hydrOXYzine (ATARAX/VISTARIL) 25 MG tablet Take 25 mg by mouth 3 (three) times daily as needed for anxiety or itching.    isosorbide mononitrate (IMDUR) 30 MG 24 hr tablet TAKE 1 TABLET (30 MG TOTAL) BY MOUTH DAILY.   metoprolol tartrate (LOPRESSOR) 25 MG tablet TAKE 1 TABLET (25 MG TOTAL) BY MOUTH 2 (TWO) TIMES DAILY.   nitroGLYCERIN (NITROSTAT) 0.4 MG SL tablet PLACE 1 TABLET (0.4 MG TOTAL) UNDER THE TONGUE EVERY 5 (FIVE) MINUTES FOR 3 DOSES AS NEEDED FOR CHEST PAIN.   polyethylene glycol (MIRALAX / GLYCOLAX) 17 g packet Take 17 g by mouth every other day.   potassium chloride SA (KLOR-CON) 20 MEQ tablet Take 20 mEq by mouth daily.   rosuvastatin (CRESTOR) 40 MG tablet Take 1 tablet (40 mg total) by mouth daily.   senna (SENOKOT) 8.6 MG TABS tablet Take 1 tablet by mouth as needed.   triamcinolone ointment (KENALOG) 0.1 % Apply 1 application topically 2 (two) times daily for 14 days (Patient taking differently: Apply 1 application topically as needed (rash).)   valACYclovir (VALTREX) 1000 MG tablet as needed.   [DISCONTINUED] clopidogrel (PLAVIX) 75 MG tablet TAKE 1 TABLET (75 MG TOTAL) BY MOUTH DAILY.   [DISCONTINUED] gabapentin (NEURONTIN) 600 MG tablet Take 1 tablet (600 mg total) by mouth daily.     Allergies:   Pfizer-biontech covid-19 vacc [covid-19 mrna vacc (moderna)], Chocolate, Ivp dye [iodinated diagnostic agents], Latex, Shellfish allergy, Fish-derived products, Peanut-containing drug products, and Soap   Social History   Socioeconomic History   Marital status: Single    Spouse name: Not on file   Number of children: Not on file   Years of education: 14   Highest  education level: Not on file  Occupational History   Not on file  Tobacco Use   Smoking status: Former    Packs/day: 1.00    Years: 20.00    Pack years: 20.00    Types: Cigarettes    Quit date: 07/14/2020    Years since quitting: 1.0   Smokeless tobacco: Never  Vaping Use   Vaping Use: Never used  Substance and Sexual Activity   Alcohol use: No    Alcohol/week: 0.0 standard drinks    Comment: occasional   Drug use: No   Sexual activity: Yes  Partners: Male    Birth control/protection: None  Other Topics Concern   Not on file  Social History Narrative   Not on file   Social Determinants of Health   Financial Resource Strain: Not on file  Food Insecurity: Not on file  Transportation Needs: Not on file  Physical Activity: Not on file  Stress: Not on file  Social Connections: Not on file    Family History: The patient's family history includes Cancer in her mother; Dementia in her father; Stroke in her father.  ROS:   Please see the history of present illness.     All other systems reviewed and are negative.  EKGs/Labs/Other Studies Reviewed:    The following studies were reviewed today:  EKG:   08/15/21: Sinus bradycardia with TWI 09/21/20: Sinus rhythm rate 61 with QTc 440 07/19/2020: Sinus Rhythm 84 QTc 508  Recent Labs: 12/18/2020: B Natriuretic Peptide 163.1 01/24/2021: ALT 9; BUN 6; Creatinine, Ser 0.76; Hemoglobin 12.4; NT-Pro BNP 144; Platelets 227; Potassium 4.1; Sodium 142  Recent Lipid Panel    Component Value Date/Time   CHOL 172 01/24/2021 1043   TRIG 45 01/24/2021 1043   HDL 88 01/24/2021 1043   CHOLHDL 2.0 01/24/2021 1043   CHOLHDL 3.1 07/15/2020 1730   VLDL 9 07/15/2020 1730   LDLCALC 74 01/24/2021 1043   Cardiac Event Monitoring: Date: 10/14/20 Results: Patient had a minimum heart rate of 49 bpm, maximum heart rate of 174 bpm, and average heart rate of 76 bpm. Predominant underlying rhythm was sinus rhythm. One run of supraventricular  tachycardia occurred lasting 4 beats at longest with a max rate of 174 bpm at fastest. Isolated PACs were rare (<1.0%), with rare couplets present. Isolated PVCs were rare (<1.0%). No evidence of complete heart block . Triggered and diary events associated with sinus rhythm.   No malignant arrhythmias.   Transthoracic Echocardiogram: Date: 02/24/21 Results:  IMPRESSIONS   1. Left ventricular ejection fraction, by estimation, is 60 to 65%. The  left ventricle has normal function. The left ventricle has no regional  wall motion abnormalities. Left ventricular diastolic parameters are  consistent with Grade I diastolic  dysfunction (impaired relaxation). The average left ventricular global  longitudinal strain is -21.8 %. The global longitudinal strain is normal.   2. Right ventricular systolic function is normal. The right ventricular  size is normal.   3. The mitral valve is normal in structure. Mild mitral valve  regurgitation. No evidence of mitral stenosis.   4. The aortic valve is tricuspid. Aortic valve regurgitation is mild. No  aortic stenosis is present.   5. The inferior vena cava is normal in size with greater than 50%  respiratory variability, suggesting right atrial pressure of 3 mmHg.   Left/Right Heart Catheterizations: Date:07/16/20 Results: Prox RCA lesion is 50% stenosed. Vasospasm noted on this lesion which improved with IC NTG. Mid RCA lesion is 99% stenosed. This was the culprit lesion which was ulcerated. A drug-eluting stent was successfully placed using a STENT RESOLUTE ONYX 3.0X15, postdilated to 3.25 mm. Post intervention, there is a 0% residual stenosis. 1st Mrg lesion is 80% stenosed. A drug-eluting stent was successfully placed using a STENT RESOLUTE ONYX 2.5X18, postdilated to 3.0 mm. Post intervention, there is a 0% residual stenosis. Prox Cx lesion is 25% stenosed. 1st Diag lesion is 50% stenosed. Prox LAD lesion is 25% stenosed. The left  ventricular systolic function is normal. LV end diastolic pressure is moderately elevated. The left ventricular ejection fraction is  55-65% by visual estimate. There is no aortic valve stenosis.  Physical Exam:    VS:  BP 118/62   Pulse 99   Ht 4\' 11"  (1.499 m)   Wt 203 lb (92.1 kg)   LMP 07/12/2016 (Approximate) Comment: 2-3 day cycle  SpO2 99%   BMI 41.00 kg/m     Wt Readings from Last 3 Encounters:  08/15/21 203 lb (92.1 kg)  01/24/21 196 lb 6.4 oz (89.1 kg)  11/17/20 193 lb 2 oz (87.6 kg)    Gen: No distress, morbid obesity Neck: No JVD Cardiac: No Rubs or Gallops, holosystolic murmur II/VI, normal rhythm and bilateral radial pulse Respiratory: Clear to auscultation bilaterally, normal effort, normal  respiratory rate GI: Soft, nontender, non-distended  MS: No  edema;  moves all extremities Integument: Skin feels warm Neuro:  At time of evaluation, alert and oriented to person/place/time/situation  Psych: Normal affect, patient feels well   ASSESSMENT:    1. Coronary artery disease involving native coronary artery of native heart without angina pectoris   2. Essential hypertension   3. Nonrheumatic mitral valve regurgitation     PLAN:    In order of problems listed above:  Coronary Artery Disease; Obstructive and possible vasospasm component HLD HTN PVCs - asymptomatic - anatomy: 50% pRCA, mRCA PCI, OM1 PCI, pLCX 25%, D1 50%, pLAD 25% - continue ASA 81 mg; Stop plavix - continue statin, goal LDL < 55; will recheck lipids LFTs today; likely lipid clinic for Repatha - continue BB - continue nitrates; Imdur 30 mg PO daily (CP free) - Continue CCB: diltiazem 120 mg XL Daily  Mitral Vale Regurgitation Mild AI - mild to moderate regurgitation - for now continue PRN lasix   Echo in 2025  Will plan for Six month follow up unless new symptoms or abnormal test results warranting change in plan  Would be reasonable for me or APP Follow up   Medication  Adjustments/Labs and Tests Ordered: Current medicines are reviewed at length with the patient today.  Concerns regarding medicines are outlined above.  Orders Placed This Encounter  Procedures   Lipid panel   ALT   EKG 12-Lead    No orders of the defined types were placed in this encounter.   Patient Instructions  Medication Instructions:  Your physician has recommended you make the following change in your medication: STOP: clopidogrel (Plavix)   *If you need a refill on your cardiac medications before your next appointment, please call your pharmacy*   Lab Work: TODAY: Lipid panel and liver function test  If you have labs (blood work) drawn today and your tests are completely normal, you will receive your results only by: [place] (if you have MyChart) OR A paper copy in the mail If you have any lab test that is abnormal or we need to change your treatment, we will call you to review the results.   Testing/Procedures: NONE   Follow-Up: At [facility], you and your health needs are our priority.  As part of our continuing mission to provide you with exceptional heart care, we have created designated Provider Care Teams.  These Care Teams include your primary Cardiologist (physician) and Advanced Practice Providers (APPs -  Physician Assistants and Nurse Practitioners) who all work together to provide you with the care you need, when you need it.   Your next appointment:   6 month(s)  The format for your next appointment:   In Person  Provider:   Werner Lean,  MD {      Signed, Werner Lean, MD  08/15/2021 2:28 PM    [place]

## 2021-08-15 ENCOUNTER — Encounter: Payer: Self-pay | Admitting: Internal Medicine

## 2021-08-15 ENCOUNTER — Other Ambulatory Visit (HOSPITAL_COMMUNITY): Payer: Self-pay

## 2021-08-15 ENCOUNTER — Ambulatory Visit: Payer: Medicare Other | Admitting: Internal Medicine

## 2021-08-15 ENCOUNTER — Other Ambulatory Visit: Payer: Self-pay

## 2021-08-15 VITALS — BP 118/62 | HR 99 | Ht 59.0 in | Wt 203.0 lb

## 2021-08-15 DIAGNOSIS — I10 Essential (primary) hypertension: Secondary | ICD-10-CM | POA: Diagnosis not present

## 2021-08-15 DIAGNOSIS — I340 Nonrheumatic mitral (valve) insufficiency: Secondary | ICD-10-CM

## 2021-08-15 DIAGNOSIS — I2510 Atherosclerotic heart disease of native coronary artery without angina pectoris: Secondary | ICD-10-CM

## 2021-08-15 NOTE — Patient Instructions (Signed)
Medication Instructions:  Your physician has recommended you make the following change in your medication: STOP: clopidogrel (Plavix)   *If you need a refill on your cardiac medications before your next appointment, please call your pharmacy*   Lab Work: TODAY: Lipid panel and liver function test  If you have labs (blood work) drawn today and your tests are completely normal, you will receive your results only by: [place] (if you have MyChart) OR A paper copy in the mail If you have any lab test that is abnormal or we need to change your treatment, we will call you to review the results.   Testing/Procedures: NONE   Follow-Up: At [facility], you and your health needs are our priority.  As part of our continuing mission to provide you with exceptional heart care, we have created designated Provider Care Teams.  These Care Teams include your primary Cardiologist (physician) and Advanced Practice Providers (APPs -  Physician Assistants and Nurse Practitioners) who all work together to provide you with the care you need, when you need it.   Your next appointment:   6 month(s)  The format for your next appointment:   In Person  Provider:   Werner Lean, MD {

## 2021-08-16 LAB — ALT: ALT: 9 IU/L (ref 0–32)

## 2021-08-16 LAB — LIPID PANEL
Chol/HDL Ratio: 2.1 ratio (ref 0.0–4.4)
Cholesterol, Total: 149 mg/dL (ref 100–199)
HDL: 72 mg/dL (ref >39)
LDL Chol Calc (NIH): 66 mg/dL (ref 0–99)
Triglycerides: 49 mg/dL (ref 0–149)
VLDL Cholesterol Cal: 11 mg/dL (ref 5–40)

## 2021-08-17 ENCOUNTER — Other Ambulatory Visit (HOSPITAL_COMMUNITY): Payer: Self-pay

## 2021-08-17 MED FILL — Isosorbide Mononitrate Tab ER 24HR 30 MG: ORAL | 90 days supply | Qty: 90 | Fill #2 | Status: AC

## 2021-08-17 MED FILL — Diltiazem HCl Coated Beads Cap ER 24HR 120 MG: ORAL | 90 days supply | Qty: 90 | Fill #2 | Status: AC

## 2021-08-18 ENCOUNTER — Telehealth: Payer: Self-pay

## 2021-08-18 DIAGNOSIS — I2510 Atherosclerotic heart disease of native coronary artery without angina pectoris: Secondary | ICD-10-CM

## 2021-08-18 DIAGNOSIS — E782 Mixed hyperlipidemia: Secondary | ICD-10-CM

## 2021-08-18 NOTE — Telephone Encounter (Signed)
-----   Message from Werner Lean, MD sent at 08/16/2021 11:19 AM EST ----- Results: LDL 66 Plan: Lipid clinic (goal < 55) for Inclisiran discussion  Werner Lean, MD

## 2021-08-18 NOTE — Telephone Encounter (Signed)
Called pt reviewed results and MD recommendations.  Pt is agreeable to plan order placed and OV scheduled for 09/13/20.  Pt expressed that hands go numb often and at times feet.  I advised pt to f/u with PCP pt verbalizes understanding. All questions answered.

## 2021-08-19 DIAGNOSIS — R195 Other fecal abnormalities: Secondary | ICD-10-CM | POA: Diagnosis not present

## 2021-08-19 DIAGNOSIS — Z03818 Encounter for observation for suspected exposure to other biological agents ruled out: Secondary | ICD-10-CM | POA: Diagnosis not present

## 2021-08-19 DIAGNOSIS — R1010 Upper abdominal pain, unspecified: Secondary | ICD-10-CM | POA: Diagnosis not present

## 2021-08-19 DIAGNOSIS — R509 Fever, unspecified: Secondary | ICD-10-CM | POA: Diagnosis not present

## 2021-08-19 DIAGNOSIS — R112 Nausea with vomiting, unspecified: Secondary | ICD-10-CM | POA: Diagnosis not present

## 2021-08-30 DIAGNOSIS — M79671 Pain in right foot: Secondary | ICD-10-CM | POA: Diagnosis not present

## 2021-08-30 DIAGNOSIS — M7701 Medial epicondylitis, right elbow: Secondary | ICD-10-CM | POA: Diagnosis not present

## 2021-08-30 DIAGNOSIS — M79672 Pain in left foot: Secondary | ICD-10-CM | POA: Diagnosis not present

## 2021-09-02 ENCOUNTER — Emergency Department (HOSPITAL_BASED_OUTPATIENT_CLINIC_OR_DEPARTMENT_OTHER): Payer: Medicare Other

## 2021-09-02 ENCOUNTER — Other Ambulatory Visit: Payer: Self-pay

## 2021-09-02 ENCOUNTER — Emergency Department (HOSPITAL_COMMUNITY)
Admission: EM | Admit: 2021-09-02 | Discharge: 2021-09-02 | Disposition: A | Discharge status: Home / Self Care | Payer: Medicare Other | Attending: Emergency Medicine | Admitting: Emergency Medicine

## 2021-09-02 ENCOUNTER — Emergency Department (HOSPITAL_COMMUNITY): Payer: Medicare Other

## 2021-09-02 ENCOUNTER — Other Ambulatory Visit (HOSPITAL_COMMUNITY): Payer: Self-pay

## 2021-09-02 DIAGNOSIS — Z955 Presence of coronary angioplasty implant and graft: Secondary | ICD-10-CM | POA: Diagnosis not present

## 2021-09-02 DIAGNOSIS — Z7982 Long term (current) use of aspirin: Secondary | ICD-10-CM | POA: Diagnosis not present

## 2021-09-02 DIAGNOSIS — M7989 Other specified soft tissue disorders: Secondary | ICD-10-CM | POA: Diagnosis not present

## 2021-09-02 DIAGNOSIS — Z91040 Latex allergy status: Secondary | ICD-10-CM | POA: Diagnosis not present

## 2021-09-02 DIAGNOSIS — Z91010 Allergy to peanuts: Secondary | ICD-10-CM | POA: Diagnosis not present

## 2021-09-02 DIAGNOSIS — M25521 Pain in right elbow: Secondary | ICD-10-CM | POA: Diagnosis not present

## 2021-09-02 DIAGNOSIS — Z87891 Personal history of nicotine dependence: Secondary | ICD-10-CM | POA: Diagnosis not present

## 2021-09-02 DIAGNOSIS — S63501A Unspecified sprain of right wrist, initial encounter: Secondary | ICD-10-CM | POA: Diagnosis not present

## 2021-09-02 DIAGNOSIS — M79641 Pain in right hand: Secondary | ICD-10-CM

## 2021-09-02 DIAGNOSIS — Z79899 Other long term (current) drug therapy: Secondary | ICD-10-CM | POA: Diagnosis not present

## 2021-09-02 DIAGNOSIS — J45909 Unspecified asthma, uncomplicated: Secondary | ICD-10-CM | POA: Diagnosis not present

## 2021-09-02 DIAGNOSIS — I10 Essential (primary) hypertension: Secondary | ICD-10-CM | POA: Diagnosis not present

## 2021-09-02 DIAGNOSIS — I2510 Atherosclerotic heart disease of native coronary artery without angina pectoris: Secondary | ICD-10-CM | POA: Diagnosis not present

## 2021-09-02 DIAGNOSIS — Z7951 Long term (current) use of inhaled steroids: Secondary | ICD-10-CM | POA: Insufficient documentation

## 2021-09-02 MED ORDER — LIDOCAINE 5 % EX OINT
1.0000 "application " | TOPICAL_OINTMENT | CUTANEOUS | 0 refills | Status: AC | PRN
  Filled 2021-09-02: qty 30, 30d supply, fill #0

## 2021-09-02 MED ORDER — PREDNISONE 10 MG PO TABS
40.0000 mg | ORAL_TABLET | Freq: Every day | ORAL | 0 refills | Status: DC
  Filled 2021-09-02: qty 20, 5d supply, fill #0

## 2021-09-02 NOTE — ED Triage Notes (Signed)
Pt. Stated, Ive had elbow pain for 2 months and now its gone to my hand that's painful. My hands started hurting 2 days ago.

## 2021-09-02 NOTE — ED Provider Notes (Signed)
[facility] EMERGENCY DEPARTMENT Provider Note   CSN: [id number] Arrival date & time: 09/02/21  3790     History Chief Complaint  Patient presents with   Hand Pain   Elbow Pain    Wendy [name] is a 55 y.o. female presents emergency department for right elbow pain that is been lasting over 2 months.  Patient reports that for the past few days the pain has been going into her right hand as well and has been accompanied by swelling.  She was seen by her primary care a few days ago and was diagnosed with medial epicondylitis and was given exercises to do.  She reports that she has had some tingling and numbness to the hand throughout, and no distinct pattern.  Denies any weakness.  Medical history includes MI with 2 cardiac stents.  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 Bilateral shoulder surgery. No known drug allergies.  Former smoker.   Hand Pain      Past Medical History:  Diagnosis Date   Anxiety    Asthma    daily and prn inhalers   Cervical disc disease    decreased range of motion   Chronic pain    shoulders, neck, lower back   Coronary artery disease    Deformity, hand    left   Depression    Endometriosis    Lupron injection Q 3 mos.   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   Wears partial dentures    upper partial    Patient Active Problem List   Diagnosis Date Noted   Melena 01/24/2021   DOE (dyspnea on exertion) 01/24/2021   PVC (premature ventricular contraction) 10/14/2020   Coronary artery disease involving native coronary artery of native heart without angina pectoris 07/30/2020   Essential hypertension 07/30/2020   Hyperlipidemia 07/30/2020   Gastroesophageal reflux disease without  esophagitis 07/30/2020   Nonrheumatic mitral valve regurgitation 07/30/2020   BV (bacterial vaginosis) 03/06/2013   ANXIETY 11/05/2007   DEPRESSION 11/05/2007   NAUSEA, CHRONIC 11/05/2007   FIBROIDS, UTERUS 03/26/2007   MIGRAINE, COMMON 03/26/2007   OVARIAN CYST 03/26/2007   DEGENERATIVE DISC DISEASE, CERVICAL SPINE 03/26/2007   DEGENERATIVE DISC DISEASE, LUMBOSACRAL SPINE 03/26/2007   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    Past Surgical History:  Procedure Laterality Date   ANKLE RECONSTRUCTION  02/01/2012   Procedure: RECONSTRUCTION ANKLE;  Surgeon: Wylene Simmer, MD;  Location: [facility];  Service: Orthopedics;  Laterality: Right;  Excision of right calcaneonavicular coalition with autologus fat graft interposition   BRACHIAL PLEXUS EXPLORATION     CARDIAC CATHETERIZATION     CORONARY STENT INTERVENTION N/A 07/15/2020   Procedure: CORONARY STENT INTERVENTION;  Surgeon: Jettie Booze, MD;  Location: [facility];  Service: Cardiovascular;  Laterality: N/A;   DIAGNOSTIC LAPAROSCOPY  10/02/2008   peritoneal bx.   LEFT HEART CATH AND CORONARY ANGIOGRAPHY N/A 07/15/2020   Procedure: LEFT HEART CATH AND CORONARY ANGIOGRAPHY;  Surgeon: Jettie Booze, MD;  Location: [facility];  Service: Cardiovascular;  Laterality: N/A;   MULTIPLE TOOTH EXTRACTIONS     upper teeth and wisdom teeth   OVARIAN CYST REMOVAL  2000  RIB RESECTION     left - cervical rib removal   SHOULDER ARTHROSCOPY W/ ROTATOR CUFF REPAIR  07/2011   right   SHOULDER SURGERY     left   TOOTH EXTRACTION     x 1     OB History     Gravida  0   Para  0   Term  0   Preterm  0   AB  0   Living  0      SAB  0   IAB  0   Ectopic  0   Multiple  0   Live Births              Family History  Problem Relation Age of Onset   Cancer Mother    Dementia Father    Stroke Father     Social History   Tobacco Use   Smoking status: Former    Packs/day: 1.00    Years: 20.00     Pack years: 20.00    Types: Cigarettes    Quit date: 07/14/2020    Years since quitting: 1.1   Smokeless tobacco: Never  Vaping Use   Vaping Use: Never used  Substance Use Topics   Alcohol use: No    Alcohol/week: 0.0 standard drinks    Comment: occasional   Drug use: No    Home Medications Prior to Admission medications   Medication Sig Start Date End Date Taking? Authorizing Provider  acetaminophen (TYLENOL) 325 MG tablet Take 2 tablets (650 mg total) by mouth every 6 (six) hours as needed. 10/01/20   Mesner, Corene Cornea, MD  albuterol (PROVENTIL HFA;VENTOLIN HFA) 108 (90 Base) MCG/ACT inhaler Inhale 2 puffs into the lungs every 4 (four) hours as needed for wheezing or shortness of breath (cough). 11/20/16   Street, [place], PA-C  aspirin EC 81 MG EC tablet Take 1 tablet (81 mg total) by mouth daily. Swallow whole. 07/17/20   Bhagat, Crista Luria, PA  beclomethasone (QVAR) 80 MCG/ACT inhaler Inhale 1 puff into the lungs 2 (two) times daily as needed (sob/wheezing).     [provider]  [employer] (POLYOX WSR-301) POWD as needed for constipation.    [provider]  cyclobenzaprine (FLEXERIL) 10 MG tablet Take 1 tablet (10 mg total) by mouth 2 (two) times daily as needed for muscle spasms. 10/01/20   Mesner, Corene Cornea, MD  diltiazem (CARDIZEM CD) 120 MG 24 hr capsule TAKE 1 CAPSULE (120 MG TOTAL) BY MOUTH DAILY. 10/14/20 11/15/21  Werner Lean, MD  EPINEPHrine (EPIPEN JR) 0.15 MG/0.3ML injection Inject 0.15 mg into the muscle daily as needed for anaphylaxis.     [provider]  ergocalciferol (VITAMIN D2) 50000 UNITS capsule Take 50,000 Units by mouth every Sunday. Sundays    [provider]  ezetimibe (ZETIA) 10 MG tablet Take 1 tablet (10 mg total) by mouth daily. 01/26/21   Werner Lean, MD  fluticasone (FLOVENT HFA) 44 MCG/ACT inhaler as needed. 02/03/21   [provider]  furosemide (LASIX) 20 MG tablet TAKE 1/2 TABLET(10 MG) BY  MOUTH DAILY AS NEEDED FOR FLUID RETENTION OR SWELLING 11/08/20 11/08/21  Chandrasekhar, Mahesh A, MD  gabapentin (NEURONTIN) 600 MG tablet Take 600 mg by mouth at bedtime.    [provider]  hydrOXYzine (ATARAX/VISTARIL) 25 MG tablet Take 25 mg by mouth 3 (three) times daily as needed for anxiety or itching.  10/09/19   [provider]  isosorbide mononitrate (IMDUR) 30 MG 24 hr  tablet TAKE 1 TABLET (30 MG TOTAL) BY MOUTH DAILY. 11/08/20 11/15/21  Rudean Haskell A, MD  metoprolol tartrate (LOPRESSOR) 25 MG tablet TAKE 1 TABLET (25 MG TOTAL) BY MOUTH 2 (TWO) TIMES DAILY. 09/27/20 09/27/21  Chandrasekhar, Lyda Kalata A, MD  nitroGLYCERIN (NITROSTAT) 0.4 MG SL tablet PLACE 1 TABLET (0.4 MG TOTAL) UNDER THE TONGUE EVERY 5 (FIVE) MINUTES FOR 3 DOSES AS NEEDED FOR CHEST PAIN. 08/11/21   Chandrasekhar, Mahesh A, MD  polyethylene glycol (MIRALAX / GLYCOLAX) 17 g packet Take 17 g by mouth every other day.    [provider]  potassium chloride SA (KLOR-CON) 20 MEQ tablet Take 20 mEq by mouth daily.    [provider]  rosuvastatin (CRESTOR) 40 MG tablet Take 1 tablet (40 mg total) by mouth daily. 02/09/21   Werner Lean, MD  senna (SENOKOT) 8.6 MG TABS tablet Take 1 tablet by mouth as needed.    [provider]  triamcinolone ointment (KENALOG) 0.1 % Apply 1 application topically 2 (two) times daily for 14 days Patient taking differently: Apply 1 application topically as needed (rash). 07/13/21     valACYclovir (VALTREX) 1000 MG tablet as needed.    [provider]    Allergies    Pfizer-biontech covid-19 vacc [covid-19 mrna vacc (moderna)], Chocolate, Ivp dye [iodinated contrast media], Latex, Shellfish allergy, Fish-derived products, Peanut-containing drug products, and Soap  Review of Systems   Review of Systems  Musculoskeletal:  Positive for joint swelling and myalgias. Negative for back pain and neck pain.  Neurological:  Positive for  numbness. Negative for weakness.  All other systems reviewed and are negative.  Physical Exam Updated Vital Signs BP 105/62 (BP Location: Left Arm)    Pulse 60    Temp 98 F (36.7 C) (Oral)    Resp 16    LMP 07/12/2016 (Approximate) Comment: 2-3 day cycle   SpO2 100%   Physical Exam Vitals and nursing note reviewed.  Constitutional:      General: She is not in acute distress.    Appearance: Normal appearance. She is not toxic-appearing.  Eyes:     General: No scleral icterus. Cardiovascular:     Rate and Rhythm: Normal rate.  Pulmonary:     Effort: Pulmonary effort is normal. No respiratory distress.  Musculoskeletal:        General: Tenderness and deformity present.     Comments: Obvious deformity to the left middle fingers with atrophy to the left forearm and hand. This is chronic for the patient.   Possible mild swelling noted to the right thumb and thenar eminence. Difficult to tell due to the significant baseline atrophy of the patient's left hand/arm. No overlying skin changes, discoloration, abrasion, or rash noted. Cap refill <3 seconds. Sensation intact. The patient has abduction and adduction intact with pain of the thumb and fingers. Radial pulse strong and regular. Pain in elbow with medial palpation and with extending the arm. Positive Phalen's. Negative Tinel's.   Skin:    General: Skin is dry.     Findings: No rash.  Neurological:     General: No focal deficit present.     Mental Status: She is alert. Mental status is at baseline.     Comments: Unable to assess decreased strength in the right hand as she has left hand atrophy.  No good baseline.  Psychiatric:        Mood and Affect: Mood normal.    ED Results / Procedures / Treatments  Labs (all labs ordered are listed, but only abnormal results are displayed) Labs Reviewed - No data to display  EKG None  Radiology DG Elbow Complete Right  Result Date: 09/02/2021 CLINICAL DATA:  Elbow pain EXAM: RIGHT  ELBOW - COMPLETE 3+ VIEW COMPARISON:  None. FINDINGS: There is no evidence of fracture, dislocation, or joint effusion. There is no evidence of arthropathy or other focal bone abnormality. Soft tissues are unremarkable. IMPRESSION: No acute osseous abnormality identified. Electronically Signed   By: Ofilia Neas M.D.   On: 09/02/2021 14:02   DG Hand Complete Right  Result Date: 09/02/2021 CLINICAL DATA:  Hand pain EXAM: RIGHT HAND - COMPLETE 3+ VIEW COMPARISON:  None. FINDINGS: There is no evidence of fracture or dislocation. There is no evidence of arthropathy or other focal bone abnormality. Possible soft tissue swelling in the fingers. IMPRESSION: No acute osseous abnormality identified. Electronically Signed   By: Ofilia Neas M.D.   On: 09/02/2021 14:05   UE VENOUS DUPLEX ([facility] 7am - 7pm)  Result Date: 09/02/2021 UPPER VENOUS STUDY  Patient Name:  TINIA ORAVEC  Date of Exam:   09/02/2021 Medical Rec #: [id number]         Accession #:    [id number] Date of Birth: 04-Jun-1966         Patient Gender: F Patient Age:   34 years Exam Location:  [facility] Procedure:      VAS [place] UPPER EXTREMITY VENOUS DUPLEX Referring Phys: Gareth Morgan --------------------------------------------------------------------------------  Indications: right elbow pain, right hand pain, right hand swelling x1 week Comparison Study: No prior study Performing Technologist: Maudry Mayhew MHA, RDMS, RVT, RDCS  Examination Guidelines: A complete evaluation includes B-mode imaging, spectral Doppler, color Doppler, and power Doppler as needed of all accessible portions of each vessel. Bilateral testing is considered an integral part of a complete examination. Limited examinations for reoccurring indications may be performed as noted.  Right Findings: +----------+------------+---------+-----------+----------+-------+  RIGHT      Compressible Phasicity Spontaneous Properties Summary   +----------+------------+---------+-----------+----------+-------+  IJV            Full        Yes        Yes                         +----------+------------+---------+-----------+----------+-------+  Subclavian     Full        Yes        Yes                         +----------+------------+---------+-----------+----------+-------+  Axillary       Full        Yes        Yes                         +----------+------------+---------+-----------+----------+-------+  Brachial       Full        Yes        Yes                         +----------+------------+---------+-----------+----------+-------+  Radial         Full                                               +----------+------------+---------+-----------+----------+-------+  Ulnar          Full                                               +----------+------------+---------+-----------+----------+-------+  Cephalic       Full                                               +----------+------------+---------+-----------+----------+-------+  Basilic        Full                                               +----------+------------+---------+-----------+----------+-------+  Left Findings: +----------+------------+---------+-----------+----------+-------+  LEFT       Compressible Phasicity Spontaneous Properties Summary  +----------+------------+---------+-----------+----------+-------+  Subclavian                 Yes        Yes                         +----------+------------+---------+-----------+----------+-------+  Summary:  Right: No evidence of deep vein thrombosis in the upper extremity. No evidence of superficial vein thrombosis in the upper extremity.  Left: No evidence of thrombosis in the subclavian.  *See table(s) above for measurements and observations.    Preliminary     Procedures Procedures   Medications Ordered in ED Medications - No data to display  ED Course  I have reviewed the triage vital signs and the nursing notes.  Pertinent labs &  imaging results that were available during my care of the patient were reviewed by me and considered in my medical decision making (see chart for details).  55 year old female presents emerged department with right hand and elbow pain.  Parental diagnosis includes is not limited to, at cubital tunnel, carpal tunnel, MSK, medial epicondylitis, compartment syndrome, septic joint.  Low suspicion for compartment syndrome and surgery based on duration of symptoms and physical exam, and patient interview.  Patient had positive Phalen's, negative Tennille's.  She does have some swelling to the medial epicondyle area of her right elbow.  Sensations intact.  Compartments are soft.  Cap refill less than 3 seconds.  She is able to AB and adduct her fingers and thumb.  Vital signs are stable.  Afebrile.  Plain films of her right hand and elbow showed no osseous abnormalities.  Hand shows possibly some soft tissue swelling.  No air producing bacteria, bony abnormalities visualized.  Upper extremity ultrasound of the right arm is negative.  No signs for DVT.  Discussed this case with my attending who assessed at bedside.  Recommended prednisone to be sent home on.  We will additionally prescribe topical lidocaine.  Recommended follow-up with hand surgery.  Strict return precautions discussed.  Patient agrees with plan.  Patient is stable be discharged home with condition.  On discharge there is a mistake in the vital signs and the pulse rate was 18, pulse rate was around 64.  Th this is a documentation error like they were trying to place it in for respirations as the respirations were 18.  I discussed this  case with my attending physician who cosigned this note including patient's presenting symptoms, physical exam, and planned diagnostics and interventions. Attending physician stated agreement with plan or made changes to plan which were implemented.    MDM Rules/Calculators/A&P                           Final  Clinical Impression(s) / ED Diagnoses Final diagnoses:  Right elbow pain  Right hand pain    Rx / DC Orders ED Discharge Orders          Ordered    predniSONE (DELTASONE) 10 MG tablet  Daily        09/02/21 1502    lidocaine (XYLOCAINE) 5 % ointment  As needed        09/02/21 1502             Sherrell Puller, [place] 09/04/21 2446    Gareth Morgan, MD 09/07/21 1701

## 2021-09-02 NOTE — ED Notes (Signed)
Patient transported to vascular. 

## 2021-09-02 NOTE — Progress Notes (Signed)
Orthopedic Tech Progress Note Patient Details:  Wendy [name] September 25, 1965 [id number]  Ortho Devices Type of Ortho Device: Velcro wrist splint Ortho Device/Splint Location: right Ortho Device/Splint Interventions: Ordered, Application, Adjustment   Post Interventions Patient Tolerated: Well Instructions Provided: Adjustment of device  Charline Bills Jimmye Wisnieski 09/02/2021, 3:42 PM Applied velcro wrist splint.

## 2021-09-02 NOTE — ED Notes (Signed)
Patient verbalizes understanding of discharge instructions. Opportunity for questioning and answers were provided. Armband removed by staff, pt discharged from ED ambulatory.   

## 2021-09-02 NOTE — ED Notes (Signed)
Ortho tech called to apply splint.

## 2021-09-02 NOTE — Progress Notes (Signed)
Right upper extremity venous duplex completed. Refer to "CV Proc" under chart review to view preliminary results.  09/02/2021 2:46 PM Kelby Aline., MHA, RVT, RDCS, RDMS

## 2021-09-02 NOTE — Discharge Instructions (Addendum)
You were seen here today for evaluation of your right elbow and right wrist pain.  Your x-ray of your hand and elbow are normal.  The ultrasound study of your hand and arm are normal.  This is likely some inflammatory process, possible carpal tunnel.  You have been prescribed prednisone, an anti-inflammatory medication to take for the next 5 days.  Additionally, you have been placed in a volar hand splint.  You can remove this to shower.  Distally, you have been prescribed topical lidocaine to apply to the affected areas as needed.  Please follow-up with the hand surgeon listed on his discharge paperwork to schedule an appointment for reevaluation.  If you have any concern, new or worsening symptoms, please return to the nearest emergency department for reevaluation.

## 2021-09-13 ENCOUNTER — Ambulatory Visit: Payer: Medicare Other | Admitting: Pharmacist

## 2021-09-13 ENCOUNTER — Other Ambulatory Visit: Payer: Self-pay

## 2021-09-13 DIAGNOSIS — I2510 Atherosclerotic heart disease of native coronary artery without angina pectoris: Secondary | ICD-10-CM | POA: Diagnosis not present

## 2021-09-13 DIAGNOSIS — E782 Mixed hyperlipidemia: Secondary | ICD-10-CM | POA: Diagnosis not present

## 2021-09-13 NOTE — Patient Instructions (Signed)
I will submit a prior authorization to your insurance for [place]. I will call you once I hear back  Please call me at [phone] with any questions  Tips for living a healthier life     Building a Healthy and Balanced Diet Make most of your meal vegetables and fruits -  of your plate. Aim for color and variety, and remember that potatoes dont count as vegetables on the Healthy Eating Plate because of their negative impact on blood sugar.  Go for whole grains -  of your plate. Whole and intact grains--whole wheat, barley, wheat berries, quinoa, oats, brown rice, and foods made with them, such as whole wheat pasta--have a milder effect on blood sugar and insulin than white bread, white rice, and other refined grains.  Protein power -  of your plate. Fish, poultry, beans, and nuts are all healthy, versatile protein sources--they can be mixed into salads, and pair well with vegetables on a plate. Limit red meat, and avoid processed meats such as bacon and sausage.  Healthy plant oils - in moderation. Choose healthy vegetable oils like olive, canola, soy, corn, sunflower, peanut, and others, and avoid partially hydrogenated oils, which contain unhealthy trans fats. Remember that low-fat does not mean healthy.  Drink water, coffee, or tea. Skip sugary drinks, limit milk and dairy products to one to two servings per day, and limit juice to a small glass per day.  Stay active. The red figure running across the [place] is a reminder that staying active is also important in weight control.  The main message of the Healthy Eating Plate is to focus on diet quality:  The type of carbohydrate in the diet is more important than the amount of carbohydrate in the diet, because some sources of carbohydrate--like vegetables (other than potatoes), fruits, whole grains, and beans--are healthier than others. The Healthy Eating Plate also advises consumers to avoid sugary  beverages, a major source of calories--usually with little nutritional value--in the American diet. The Healthy Eating Plate encourages consumers to use healthy oils, and it does not set a maximum on the percentage of calories people should get each day from healthy sources of fat. In this way, the Healthy Eating Plate recommends the opposite of the low-fat message promoted for decades by the USDA.  [employer].no  SUGAR  Sugar is a huge problem in the modern day diet. Sugar is a big contributor to heart disease, diabetes, high triglyceride levels, fatty liver disease and obesity. Sugar is hidden in almost all packaged foods/beverages. Added sugar is extra sugar that is added beyond what is naturally found and has no nutritional benefit for your body. The American Heart Association recommends limiting added sugars to no more than 25g for women and 36 grams for men per day. There are many names for sugar including maltose, sucrose (names ending in "ose"), high fructose corn syrup, molasses, cane sugar, corn sweetener, raw sugar, syrup, honey or fruit juice concentrate.   One of the best ways to limit your added sugars is to stop drinking sweetened beverages such as soda, sweet tea, and fruit juice.  There is 65g of added sugars in one 20oz bottle of Coke! That is equal to 7.5 donuts.   Pay attention and read all nutrition facts labels. Below is an examples of a nutrition facts label. The #1 is showing you the total sugars where the # 2 is showing you the added sugars. This one serving has almost the max amount of  added sugars per day!     20 oz Soda 65g Sugar = 7.5 Glazed Donuts  16oz Energy  Drink 54g Sugar = 6.5 Glazed Donuts  Large Sweet  Tea 38g Sugar = 4 Glazed Donuts  20oz Sports  Drink 34g Sugar = 3.5 Glazed Donuts  8oz Chocolate Milk 24g Sugar =2.5 Glazed Donuts  8oz Orange  Juice 21g Sugar = 2 Glazed Donuts  1 Juice  Box 14g Sugar = 1.5 Glazed Donuts  16oz Water= NO SUGAR!!  EXERCISE  Exercise is good. Weve all heard that. In an ideal world, we would all have time and resources to get plenty of it. When you are active, your heart pumps more efficiently and you will feel better.  Multiple studies show that even walking regularly has benefits that include living a longer life. The American Heart Association recommends 150 minutes per week of exercise (30 minutes per day most days of the week). You can do this in any increment you wish. Nine or more 10-minute walks count. So does an hour-long exercise class. Break the time apart into what will work in your life. Some of the best things you can do include walking briskly, jogging, cycling or swimming laps. Not everyone is ready to exercise. Sometimes we need to start with just getting active. Here are some easy ways to be more active throughout the day:  Take the stairs instead of the elevator  Go for a 10-15 minute walk during your lunch break (find a friend to make it more enjoyable)  When shopping, park at the back of the parking lot  If you take public transportation, get off one stop early and walk the extra distance  Pace around while making phone calls  Check with your doctor if you arent sure what your limitations may be. Always remember to drink plenty of water when doing any type of exercise. Dont feel like a failure if youre not getting the 90-150 minutes per week. If you started by being a couch potato, then just a 10-minute walk each day is a huge improvement. Start with little victories and work your way up.   HEALTHY EATING TIPS  When looking to improve your eating habits, whether to lose weight, lower blood pressure or just be healthier, it helps to know what a serving size is.   Grains 1 slice of bread,  bagel,  cup pasta or rice  Vegetables 1 cup fresh or raw vegetables,  cup cooked or canned Fruits 1 piece of medium sized fruit,   cup canned,   Meats/Proteins  cup dried       1 oz meat, 1 egg,  cup cooked beans, nuts or seeds  Dairy        Fats Individual yogurt container, 1 cup (8oz)    1 teaspoon margarine/butter or vegetable  milk or milk alternative, 1 slice of cheese          oil; 1 tablespoon mayonnaise or salad dressing                  Plan ahead: make a menu of the meals for a week then create a grocery list to go with that menu. Consider meals that easily stretch into a night of leftovers, such as stews or casseroles. Or consider making two of your favorite meal and put one in the freezer for another night. Try a night or two each week that is meatless or no cook such as salads. When you get  home from the grocery store wash and prepare your vegetables and fruits. Then when you need them they are ready to go.   Tips for going to the grocery store:  [place] store or generic brands  Check the weekly ad from your store on-line or in their in-store flyer  Look at the unit price on the shelf tag to compare/contrast the costs of different items  Buy fruits/vegetables in season  Carrots, bananas and apples are low-cost, naturally healthy items  If meats or frozen vegetables are on sale, buy some extras and put in your freezer  Limit buying prepared or ready to eat items, even if they are pre-made salads or fruit snacks  Do not shop when youre hungry  Foods at eye level tend to be more expensive. Look on the high and low shelves for deals.  Consider shopping at the farmers market for fresh foods in season.  Avoid the cookie and chip aisles (these are expensive, high in calories and low in nutritional value). Shop on the outside of the grocery store.  Healthy food preparations:  If you cant get lean hamburger, be sure to drain the fat when cooking  Steam, saut (in olive oil), grill or bake foods  Experiment with different seasonings to avoid adding salt to your foods. Kosher salt, sea salt and Himalayan salt  are all still salt and should be avoided. Try seasoning food with onion, garlic, thyme, rosemary, basil ect. Onion powder or garlic powder is ok. Avoid if it says salt (ie garlic salt).

## 2021-09-13 NOTE — Progress Notes (Signed)
Patient ID: Wendy [name]                 DOB: Feb 12, 1966                    MRN: [id number]     HPI: Wendy [name] is a 56 y.o. female patient referred to lipid clinic by Dr. Gasper Sells. PMH is significant for  CAD (NSTEMI 07/16/20 with mRCA DES, 50% pRCA; OM1 PCI, D1 50%) with preserved EF, HTN, HLD, Former Tobacco Use and GERD. Most recent LDL was found to be 66. Patient was referred to lipid clinic to discuss PCSK9i vs Leqvio.   Patient presents today to lipid clinic. She denies any issues with rosuvastatin or ezetimibe. Had issue with pain in elbow and swelling which seems unrelated to medications. Admits that she does not exercise like she should. Gets SOB sometimes walking up steps. Was going to the gym, sometimes would do well and sometimes would be SOB and fatigued. Her biggest weakness is sweets. She has a medicare advantage plan.   Current Medications: rosuvastatin 40mg  daily, ezetimibe 10mg  daily Risk Factors: premature CAD, HTN  LDL goal: <55  Diet:  Breakfast: eggs, sausage, biscuit, pancakes, syrup Lunch: nothing Dinner:  Big sweets eater drinks soda  Exercise: was going to the gym but stopped, did get an exercise bike for christmas  Family History: The patient's family history includes Cancer in her mother; Dementia in her father; Stroke in her father.  Social History: former smoker  Labs: 08/15/21 TC 149, TG 49, HDL 72, LD-CL 66 (rosuvastatin 40mg  daily, ezetimibe 10mg  daily)  Past Medical History:  Diagnosis Date   Anxiety    Asthma    daily and prn inhalers   Cervical disc disease    decreased range of motion   Chronic pain    shoulders, neck, lower back   Coronary artery disease    Deformity, hand    left   Depression    Endometriosis    Lupron injection Q 3 mos.   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 30 tablet 0   albuterol (PROVENTIL HFA;VENTOLIN HFA) 108 (90 Base) MCG/ACT inhaler Inhale 2 puffs into the lungs every 4 (four) hours as needed for wheezing or shortness of breath (cough). 1 Inhaler 0   aspirin EC 81 MG EC tablet Take 1 tablet (81 mg total) by mouth daily. Swallow whole. 30 tablet 11   beclomethasone (QVAR) 80 MCG/ACT inhaler Inhale 1 puff into the lungs 2 (two) times daily as needed (sob/wheezing).      [employer] (POLYOX WSR-301) POWD as needed for constipation.     cyclobenzaprine (FLEXERIL) 10 MG tablet Take 1 tablet (10 mg total) by mouth 2 (two) times daily as needed for muscle spasms. 20 tablet 0   diltiazem (CARDIZEM CD) 120 MG 24 hr capsule TAKE 1 CAPSULE (120 MG TOTAL) BY MOUTH DAILY. 90 capsule 3   EPINEPHrine (EPIPEN JR) 0.15 MG/0.3ML  injection Inject 0.15 mg into the muscle daily as needed for anaphylaxis.      ergocalciferol (VITAMIN D2) 50000 UNITS capsule Take 50,000 Units by mouth every Sunday. Sundays     ezetimibe (ZETIA) 10 MG tablet Take 1 tablet (10 mg total) by mouth daily. 90 tablet 3   fluticasone (FLOVENT HFA) 44 MCG/ACT inhaler as needed.     furosemide (LASIX) 20 MG tablet TAKE 1/2 TABLET(10 MG) BY MOUTH DAILY AS NEEDED FOR FLUID RETENTION OR SWELLING 45 tablet 3   gabapentin (NEURONTIN) 600 MG tablet Take 600 mg by mouth at bedtime.     hydrOXYzine (ATARAX/VISTARIL) 25 MG tablet Take 25 mg by mouth 3 (three) times daily as needed for anxiety or itching.      isosorbide mononitrate (IMDUR) 30 MG 24 hr tablet TAKE 1 TABLET (30 MG TOTAL) BY MOUTH DAILY. 90 tablet 3   lidocaine (XYLOCAINE) 5 % ointment Apply 1 application topically as needed. 30 g 0    metoprolol tartrate (LOPRESSOR) 25 MG tablet TAKE 1 TABLET (25 MG TOTAL) BY MOUTH 2 (TWO) TIMES DAILY. 180 tablet 3   nitroGLYCERIN (NITROSTAT) 0.4 MG SL tablet PLACE 1 TABLET (0.4 MG TOTAL) UNDER THE TONGUE EVERY 5 (FIVE) MINUTES FOR 3 DOSES AS NEEDED FOR CHEST PAIN. 25 tablet 5   polyethylene glycol (MIRALAX / GLYCOLAX) 17 g packet Take 17 g by mouth every other day.     potassium chloride SA (KLOR-CON) 20 MEQ tablet Take 20 mEq by mouth daily.     predniSONE (DELTASONE) 10 MG tablet Take 4 tablets (40 mg total) by mouth daily. 20 tablet 0   rosuvastatin (CRESTOR) 40 MG tablet Take 1 tablet (40 mg total) by mouth daily. 90 tablet 3   senna (SENOKOT) 8.6 MG TABS tablet Take 1 tablet by mouth as needed.     triamcinolone ointment (KENALOG) 0.1 % Apply 1 application topically 2 (two) times daily for 14 days (Patient taking differently: Apply 1 application topically as needed (rash).) 80 g 0   valACYclovir (VALTREX) 1000 MG tablet as needed.     No current facility-administered medications on file prior to visit.    Allergies  Allergen Reactions   Pfizer-Biontech Covid-19 Vacc [Covid-19 Mrna Vacc (Moderna)] Shortness Of Breath and Swelling    Tightness in Chest   Chocolate Hives   Ivp Dye [Iodinated Contrast Media] Nausea And Vomiting    After CT scan   Latex Hives, Itching and Swelling   Shellfish Allergy    Fish-Derived Products Nausea And Vomiting   Peanut-Containing Drug Products Hives   Soap Rash    Assessment/Plan:  1. Hyperlipidemia - LDL-C is above goal of <55 due to premature disease. Discussed adding PCSK9i. Reviewed injection technique, side effects, CVRR and cost with patient. She is agreeable to try. Will submit prior authorization for Repatha. Will apply for [employer] grant once approved. We discussed that if LDL is sufficiently low on Repatha we could d/c ezetimibe once repeat labs come back. Continue rosuvastatin 40mg  daily and ezetimibe 10mg  daily.  We talked about  trying to limit sweets, avoid overly processed foods, limit saturated fats, increase intake of vegetables, whole grains, beans, lean meats and limit red meat. I encouraged exercise.   Thank you,   Ramond Dial, Pharm.D, BCPS, CPP [facility]  [address], [place], [place] [address]  Phone: [phone]; Fax: [phone]

## 2021-09-14 ENCOUNTER — Telehealth: Payer: Self-pay | Admitting: Pharmacist

## 2021-09-14 DIAGNOSIS — E782 Mixed hyperlipidemia: Secondary | ICD-10-CM

## 2021-09-14 MED ORDER — REPATHA SURECLICK 140 MG/ML ~~LOC~~ SOAJ: 1.0000 "pen " | SUBCUTANEOUS | 11 refills | Status: DC

## 2021-09-14 NOTE — Telephone Encounter (Signed)
PA for Repatha approved. [place] approved. Info called into [facility]. Went through for no charge. Patient notified and scheduled for follow up labs on 3/16.  CARD NO. [id number]   CARD STATUS Active   BIN [id number]   PCN [id number]   PC GROUP [id number]

## 2021-09-19 DIAGNOSIS — E559 Vitamin D deficiency, unspecified: Secondary | ICD-10-CM | POA: Diagnosis not present

## 2021-09-19 DIAGNOSIS — Z122 Encounter for screening for malignant neoplasm of respiratory organs: Secondary | ICD-10-CM | POA: Diagnosis not present

## 2021-09-19 DIAGNOSIS — Z0000 Encounter for general adult medical examination without abnormal findings: Secondary | ICD-10-CM | POA: Diagnosis not present

## 2021-09-19 DIAGNOSIS — Z87891 Personal history of nicotine dependence: Secondary | ICD-10-CM | POA: Diagnosis not present

## 2021-09-19 DIAGNOSIS — Z23 Encounter for immunization: Secondary | ICD-10-CM | POA: Diagnosis not present

## 2021-09-27 ENCOUNTER — Other Ambulatory Visit (HOSPITAL_COMMUNITY): Payer: Self-pay

## 2021-09-27 DIAGNOSIS — M25521 Pain in right elbow: Secondary | ICD-10-CM | POA: Diagnosis not present

## 2021-10-10 DIAGNOSIS — M79601 Pain in right arm: Secondary | ICD-10-CM | POA: Diagnosis not present

## 2021-10-10 DIAGNOSIS — M25521 Pain in right elbow: Secondary | ICD-10-CM | POA: Diagnosis not present

## 2021-10-18 DIAGNOSIS — M79601 Pain in right arm: Secondary | ICD-10-CM | POA: Diagnosis not present

## 2021-10-18 DIAGNOSIS — M25521 Pain in right elbow: Secondary | ICD-10-CM | POA: Diagnosis not present

## 2021-10-21 ENCOUNTER — Other Ambulatory Visit (HOSPITAL_COMMUNITY): Payer: Self-pay

## 2021-10-21 ENCOUNTER — Other Ambulatory Visit: Payer: Self-pay | Admitting: Internal Medicine

## 2021-10-21 MED ORDER — METOPROLOL TARTRATE 25 MG PO TABS
ORAL_TABLET | Freq: Two times a day (BID) | ORAL | 3 refills | Status: DC
  Filled 2021-10-21: qty 180, 90d supply, fill #0

## 2021-10-24 ENCOUNTER — Other Ambulatory Visit (HOSPITAL_COMMUNITY): Payer: Self-pay

## 2021-10-26 ENCOUNTER — Encounter: Payer: Self-pay | Admitting: Pharmacist

## 2021-11-01 DIAGNOSIS — M79601 Pain in right arm: Secondary | ICD-10-CM | POA: Diagnosis not present

## 2021-11-01 DIAGNOSIS — M25521 Pain in right elbow: Secondary | ICD-10-CM | POA: Diagnosis not present

## 2021-11-09 ENCOUNTER — Other Ambulatory Visit: Payer: Self-pay | Admitting: Physician Assistant

## 2021-11-09 DIAGNOSIS — Z87891 Personal history of nicotine dependence: Secondary | ICD-10-CM

## 2021-11-09 DIAGNOSIS — Z122 Encounter for screening for malignant neoplasm of respiratory organs: Secondary | ICD-10-CM

## 2021-11-14 DIAGNOSIS — M79644 Pain in right finger(s): Secondary | ICD-10-CM | POA: Diagnosis not present

## 2021-11-14 DIAGNOSIS — M25571 Pain in right ankle and joints of right foot: Secondary | ICD-10-CM | POA: Diagnosis not present

## 2021-11-14 DIAGNOSIS — M7701 Medial epicondylitis, right elbow: Secondary | ICD-10-CM | POA: Diagnosis not present

## 2021-11-24 ENCOUNTER — Other Ambulatory Visit (HOSPITAL_COMMUNITY): Payer: Self-pay

## 2021-11-24 ENCOUNTER — Other Ambulatory Visit: Payer: Self-pay

## 2021-11-24 ENCOUNTER — Other Ambulatory Visit: Payer: Medicare Other

## 2021-11-24 DIAGNOSIS — E782 Mixed hyperlipidemia: Secondary | ICD-10-CM | POA: Diagnosis not present

## 2021-11-25 ENCOUNTER — Other Ambulatory Visit: Payer: Self-pay | Admitting: Pharmacist

## 2021-11-25 LAB — LIPID PANEL
Chol/HDL Ratio: 1.5 ratio (ref 0.0–4.4)
Cholesterol, Total: 96 mg/dL — ABNORMAL LOW (ref 100–199)
HDL: 62 mg/dL (ref >39)
LDL Chol Calc (NIH): 23 mg/dL (ref 0–99)
Triglycerides: 37 mg/dL (ref 0–149)
VLDL Cholesterol Cal: 11 mg/dL (ref 5–40)

## 2021-11-25 LAB — APOLIPOPROTEIN B: Apolipoprotein B: 38 mg/dL (ref <90)

## 2021-11-29 ENCOUNTER — Telehealth: Payer: Self-pay | Admitting: Pharmacist

## 2021-11-29 ENCOUNTER — Other Ambulatory Visit (HOSPITAL_COMMUNITY): Payer: Self-pay

## 2021-11-29 MED ORDER — REPATHA SURECLICK 140 MG/ML ~~LOC~~ SOAJ
1.0000 "pen " | SUBCUTANEOUS | 11 refills | Status: DC
  Filled 2021-11-29: qty 2, 28d supply, fill #0
  Filled 2021-12-29: qty 2, 28d supply, fill #1
  Filled 2022-01-31: qty 2, 28d supply, fill #2
  Filled 2022-02-28: qty 2, 28d supply, fill #3
  Filled 2022-03-24: qty 2, 28d supply, fill #4
  Filled 2022-04-24: qty 2, 28d supply, fill #5
  Filled 2022-05-23: qty 2, 28d supply, fill #6
  Filled 2022-06-21: qty 2, 28d supply, fill #7
  Filled 2022-07-20: qty 2, 28d supply, fill #8
  Filled 2022-08-21: qty 2, 28d supply, fill #9
  Filled 2022-09-18: qty 2, 28d supply, fill #10
  Filled 2022-10-16: qty 2, 28d supply, fill #11

## 2021-11-29 NOTE — Telephone Encounter (Signed)
Reviewed labs with patient. Advised that she can stop ezetimibe '10mg'$  daily since LDL and Apo B are very good. Will recheck in 3 months.  ?Pt requested Rx for Repatha be sent to [facility]. Rx sent. ?

## 2021-12-05 ENCOUNTER — Emergency Department (HOSPITAL_COMMUNITY)
Admission: EM | Admit: 2021-12-05 | Discharge: 2021-12-06 | Discharge status: Against Medical Advice | Payer: Medicare Other | Attending: Emergency Medicine | Admitting: Emergency Medicine

## 2021-12-05 ENCOUNTER — Encounter (HOSPITAL_COMMUNITY): Payer: Self-pay | Admitting: Emergency Medicine

## 2021-12-05 ENCOUNTER — Other Ambulatory Visit: Payer: Self-pay

## 2021-12-05 DIAGNOSIS — Z5321 Procedure and treatment not carried out due to patient leaving prior to being seen by health care provider: Secondary | ICD-10-CM | POA: Insufficient documentation

## 2021-12-05 DIAGNOSIS — R1030 Lower abdominal pain, unspecified: Secondary | ICD-10-CM | POA: Diagnosis not present

## 2021-12-05 DIAGNOSIS — K625 Hemorrhage of anus and rectum: Secondary | ICD-10-CM | POA: Insufficient documentation

## 2021-12-05 LAB — CBC WITH DIFFERENTIAL/PLATELET
Abs Immature Granulocytes: 0.02 10*3/uL (ref 0.00–0.07)
Basophils Absolute: 0 10*3/uL (ref 0.0–0.1)
Basophils Relative: 0 %
Eosinophils Absolute: 0.3 10*3/uL (ref 0.0–0.5)
Eosinophils Relative: 4 %
HCT: 39.6 % (ref 36.0–46.0)
Hemoglobin: 12.4 g/dL (ref 12.0–15.0)
Immature Granulocytes: 0 %
Lymphocytes Relative: 45 %
Lymphs Abs: 3.5 10*3/uL (ref 0.7–4.0)
MCH: 28.4 pg (ref 26.0–34.0)
MCHC: 31.3 g/dL (ref 30.0–36.0)
MCV: 90.6 fL (ref 80.0–100.0)
Monocytes Absolute: 0.8 10*3/uL (ref 0.1–1.0)
Monocytes Relative: 9 %
Neutro Abs: 3.4 10*3/uL (ref 1.7–7.7)
Neutrophils Relative %: 42 %
Platelets: 246 10*3/uL (ref 150–400)
RBC: 4.37 MIL/uL (ref 3.87–5.11)
RDW: 12.8 % (ref 11.5–15.5)
WBC: 8.1 10*3/uL (ref 4.0–10.5)
nRBC: 0 % (ref 0.0–0.2)

## 2021-12-05 LAB — COMPREHENSIVE METABOLIC PANEL
ALT: 15 U/L (ref 0–44)
AST: 18 U/L (ref 15–41)
Albumin: 3.8 g/dL (ref 3.5–5.0)
Alkaline Phosphatase: 51 U/L (ref 38–126)
Anion gap: 6 (ref 5–15)
BUN: 11 mg/dL (ref 6–20)
CO2: 24 mmol/L (ref 22–32)
Calcium: 9.2 mg/dL (ref 8.9–10.3)
Chloride: 109 mmol/L (ref 98–111)
Creatinine, Ser: 0.74 mg/dL (ref 0.44–1.00)
GFR, Estimated: >60 mL/min (ref >60)
Glucose, Bld: 94 mg/dL (ref 70–99)
Potassium: 3.5 mmol/L (ref 3.5–5.1)
Sodium: 139 mmol/L (ref 135–145)
Total Bilirubin: 0.5 mg/dL (ref 0.3–1.2)
Total Protein: 7 g/dL (ref 6.5–8.1)

## 2021-12-05 LAB — URINALYSIS, ROUTINE W REFLEX MICROSCOPIC
Bacteria, UA: NONE SEEN
Bilirubin Urine: NEGATIVE
Glucose, UA: NEGATIVE mg/dL
Ketones, ur: NEGATIVE mg/dL
Leukocytes,Ua: NEGATIVE
Nitrite: NEGATIVE
Protein, ur: NEGATIVE mg/dL
Specific Gravity, Urine: 1.008 (ref 1.005–1.030)
pH: 5 (ref 5.0–8.0)

## 2021-12-05 LAB — TYPE AND SCREEN
ABO/RH(D): AB POS
Antibody Screen: NEGATIVE

## 2021-12-05 LAB — LIPASE, BLOOD: Lipase: 47 U/L (ref 11–51)

## 2021-12-05 LAB — ABO/RH: ABO/RH(D): AB POS

## 2021-12-05 NOTE — ED Triage Notes (Signed)
Pt arrive POV from home for c/o abd pain and rectal bleed since this morning. No on blood thinners hx of ulcer. ?

## 2021-12-05 NOTE — ED Provider Triage Note (Signed)
Emergency Medicine Provider Triage Evaluation Note ? ?Wendy [name] , a 56 y.o. female  was evaluated in triage.  Pt complains of abdominal pain and rectal bleeding.  Patient reports that she woke up this morning with a complaint of abdominal pain.  Patient states that she went to the bathroom and after wiping noticed blood.  Patient states that since then she has seen "small globs of blood," and intolerable after having bowel movements.  Reports that abdominal pain is to the lower abdomen.  Pain has been present throughout the entire day. ? ?Patient is not on any blood thinners.  Denies any frequent NSAID use. ? ?Review of Systems  ?Positive: Abdominal pain, rectal bleeding ?Negative: Nausea, vomiting, diarrhea, dysuria, hematuria, urinary urgency vaginal pain, vaginal bleeding, vaginal discharge ? ?Physical Exam  ?BP (!) 158/66 (BP Location: Right Arm)   Pulse (!) 55   Temp 98 ?F (36.7 ?C) (Oral)   Resp 16   Ht '4\' 11"'$  (1.499 m)   Wt 92.1 kg   LMP 07/12/2016 (Approximate) Comment: 2-3 day cycle  SpO2 100%   BMI 41.00 kg/m?  ?Gen:   Awake, no distress   ?Resp:  Normal effort  ?MSK:   Moves extremities without difficulty  ?Other:  Abdomen soft, nondistended, nontender no guarding or rebound tenderness. ? ?Medical Decision Making  ?Medically screening exam initiated at 7:46 PM.  Appropriate orders placed.  JAMESON MORROW was informed that the remainder of the evaluation will be completed by another provider, this initial triage assessment does not replace that evaluation, and the importance of remaining in the ED until their evaluation is complete. ? ? ?  ?Loni Beckwith, PA-C ?12/05/21 1948 ? ?

## 2021-12-06 ENCOUNTER — Other Ambulatory Visit (HOSPITAL_COMMUNITY): Payer: Self-pay

## 2021-12-06 ENCOUNTER — Encounter: Payer: Self-pay | Admitting: Pharmacist

## 2021-12-06 ENCOUNTER — Other Ambulatory Visit: Payer: Self-pay | Admitting: Internal Medicine

## 2021-12-06 DIAGNOSIS — E782 Mixed hyperlipidemia: Secondary | ICD-10-CM

## 2021-12-06 MED ORDER — FUROSEMIDE 20 MG PO TABS
10.0000 mg | ORAL_TABLET | Freq: Every day | ORAL | 2 refills | Status: DC | PRN
  Filled 2021-12-06: qty 45, 90d supply, fill #0

## 2021-12-06 NOTE — ED Notes (Signed)
Called pt x4 to come back to a room, no response. Charge nurse notified. ?

## 2021-12-07 ENCOUNTER — Other Ambulatory Visit (HOSPITAL_COMMUNITY): Payer: Self-pay

## 2021-12-08 ENCOUNTER — Ambulatory Visit
Admission: RE | Admit: 2021-12-08 | Discharge: 2021-12-08 | Disposition: A | Discharge status: Home / Self Care | Payer: Medicare Other | Source: Ambulatory Visit | Attending: Physician Assistant | Admitting: Physician Assistant

## 2021-12-08 DIAGNOSIS — Z87891 Personal history of nicotine dependence: Secondary | ICD-10-CM

## 2021-12-08 DIAGNOSIS — Z122 Encounter for screening for malignant neoplasm of respiratory organs: Secondary | ICD-10-CM

## 2021-12-09 ENCOUNTER — Other Ambulatory Visit (HOSPITAL_COMMUNITY): Payer: Self-pay

## 2021-12-09 DIAGNOSIS — K625 Hemorrhage of anus and rectum: Secondary | ICD-10-CM | POA: Diagnosis not present

## 2021-12-09 MED ORDER — KETOCONAZOLE 2 % EX CREA
1.0000 "application " | TOPICAL_CREAM | Freq: Two times a day (BID) | CUTANEOUS | 0 refills | Status: AC
  Filled 2021-12-09: qty 30, 15d supply, fill #0

## 2021-12-09 MED ORDER — HYDROCORTISONE (PERIANAL) 2.5 % EX CREA
1.0000 "application " | TOPICAL_CREAM | Freq: Two times a day (BID) | CUTANEOUS | 0 refills | Status: DC
  Filled 2021-12-09: qty 30, 14d supply, fill #0

## 2021-12-14 ENCOUNTER — Other Ambulatory Visit: Payer: Self-pay | Admitting: Internal Medicine

## 2021-12-15 ENCOUNTER — Other Ambulatory Visit (HOSPITAL_COMMUNITY): Payer: Self-pay

## 2021-12-15 MED ORDER — ISOSORBIDE MONONITRATE ER 30 MG PO TB24
30.0000 mg | ORAL_TABLET | Freq: Every day | ORAL | 2 refills | Status: DC
  Filled 2021-12-15: qty 90, 90d supply, fill #0
  Filled 2022-04-24: qty 90, 90d supply, fill #1
  Filled 2022-10-16: qty 90, 90d supply, fill #2

## 2021-12-30 ENCOUNTER — Other Ambulatory Visit (HOSPITAL_COMMUNITY): Payer: Self-pay

## 2022-01-31 ENCOUNTER — Other Ambulatory Visit (HOSPITAL_COMMUNITY): Payer: Self-pay

## 2022-02-01 ENCOUNTER — Other Ambulatory Visit (HOSPITAL_COMMUNITY): Payer: Self-pay

## 2022-02-01 ENCOUNTER — Other Ambulatory Visit: Payer: Self-pay | Admitting: Internal Medicine

## 2022-02-01 DIAGNOSIS — R051 Acute cough: Secondary | ICD-10-CM | POA: Diagnosis not present

## 2022-02-01 DIAGNOSIS — H66001 Acute suppurative otitis media without spontaneous rupture of ear drum, right ear: Secondary | ICD-10-CM | POA: Diagnosis not present

## 2022-02-01 MED ORDER — BENZONATATE 200 MG PO CAPS
200.0000 mg | ORAL_CAPSULE | Freq: Three times a day (TID) | ORAL | 0 refills | Status: DC
Start: 2022-02-01 — End: 2022-02-13
  Filled 2022-02-01: qty 30, 10d supply, fill #0

## 2022-02-01 MED ORDER — AMOXICILLIN-POT CLAVULANATE 875-125 MG PO TABS
1.0000 | ORAL_TABLET | Freq: Two times a day (BID) | ORAL | 0 refills | Status: DC
  Filled 2022-02-01: qty 20, 10d supply, fill #0

## 2022-02-01 MED ORDER — DILTIAZEM HCL ER COATED BEADS 120 MG PO CP24
120.0000 mg | ORAL_CAPSULE | Freq: Every day | ORAL | 2 refills | Status: DC
  Filled 2022-02-01: qty 90, 90d supply, fill #0
  Filled 2022-12-18 – 2023-01-18 (×3): qty 90, 90d supply, fill #1

## 2022-02-08 ENCOUNTER — Other Ambulatory Visit (HOSPITAL_COMMUNITY): Payer: Self-pay

## 2022-02-08 MED ORDER — PROMETHAZINE-DM 6.25-15 MG/5ML PO SYRP
5.0000 mL | ORAL_SOLUTION | Freq: Four times a day (QID) | ORAL | 0 refills | Status: DC
  Filled 2022-02-08: qty 100, 5d supply, fill #0

## 2022-02-08 MED ORDER — PREDNISONE 5 MG PO TABS
ORAL_TABLET | ORAL | 0 refills | Status: DC
  Filled 2022-02-08: qty 21, 6d supply, fill #0

## 2022-02-09 ENCOUNTER — Other Ambulatory Visit: Payer: Medicare Other | Admitting: *Deleted

## 2022-02-09 DIAGNOSIS — E782 Mixed hyperlipidemia: Secondary | ICD-10-CM | POA: Diagnosis not present

## 2022-02-10 LAB — LIPID PANEL
Chol/HDL Ratio: 1.6 ratio (ref 0.0–4.4)
Cholesterol, Total: 138 mg/dL (ref 100–199)
HDL: 89 mg/dL (ref >39)
LDL Chol Calc (NIH): 38 mg/dL (ref 0–99)
Triglycerides: 44 mg/dL (ref 0–149)
VLDL Cholesterol Cal: 11 mg/dL (ref 5–40)

## 2022-02-10 LAB — APOLIPOPROTEIN B: Apolipoprotein B: 42 mg/dL (ref <90)

## 2022-02-12 NOTE — Progress Notes (Unsigned)
.  Cardiology Office Note:    Date:  02/13/2022   ID:  Wendy [name], DOB [birth year], MRN [id number]  PCP:  Johna Roles, PA  [facility] Cardiologist:  Werner Lean, MD  [facility] Electrophysiologist:  None   Referring MD: Johna Roles, PA   CC: Follow up visit  History of Present Illness:    Wendy [name] is a 56 y.o. female with a hx of CAD (NSTEMI 07/16/20 with mRCA DES, 50% pRCA; OM1 PCI, D1 50%) with preserved EF, HTN, HLD, Former Tobacco Use, GERD, who presented 07/15/20 for NSTEMI.  2022: Seen w/ PVCs and ZioPatch was placed.  Had some melena that has resolved. 2023: stopped zetia, needs summer lipids  Patient notes that she is doing well.   Since last visit notes that there are days that she has some exertional fatigue with weight gain.  No leg swelling with her fluid pill if needed  Not sure why she is gaining this kind of weight Went to ED for rectal bleeding.  Did not stay in ED.  First time is was a lot, second time it was a little.  Happened again in May.  No chest pain or pressure.  No SOB/DOE and no PND/Orthopnea.   No palpitations or syncope .  Has felt great on the IMDUR and nitroglycerin and does not want to stop it.   Past Medical History:  Diagnosis Date   Anxiety    Asthma    daily and prn inhalers   Cervical disc disease    decreased range of motion   Chronic pain    shoulders, neck, lower back   Coronary artery disease    Deformity, hand    left   Depression    Endometriosis    Lupron injection Q 3 mos.   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   Wears partial dentures    upper partial    Past Surgical History:  Procedure Laterality Date   ANKLE RECONSTRUCTION  02/01/2012   Procedure: RECONSTRUCTION  ANKLE;  Surgeon: Wylene Simmer, MD;  Location: [place];  Service: Orthopedics;  Laterality: Right;  Excision of right calcaneonavicular coalition with autologus fat graft interposition   BRACHIAL PLEXUS EXPLORATION     CARDIAC CATHETERIZATION     CORONARY STENT INTERVENTION N/A 07/15/2020   Procedure: CORONARY STENT INTERVENTION;  Surgeon: Jettie Booze, MD;  Location: [place] [facility];  Service: Cardiovascular;  Laterality: N/A;   DIAGNOSTIC LAPAROSCOPY  10/02/2008   peritoneal bx.   LEFT HEART CATH AND CORONARY ANGIOGRAPHY N/A 07/15/2020   Procedure: LEFT HEART CATH AND CORONARY ANGIOGRAPHY;  Surgeon: Jettie Booze, MD;  Location: [place] [facility];  Service: Cardiovascular;  Laterality: N/A;   MULTIPLE TOOTH EXTRACTIONS     upper teeth and wisdom teeth   OVARIAN CYST REMOVAL  2000   RIB RESECTION     left - cervical rib removal   SHOULDER ARTHROSCOPY W/ ROTATOR CUFF REPAIR  07/2011   right   SHOULDER SURGERY     left   TOOTH EXTRACTION     x 1   Current Medications: Current Meds  Medication Sig   acetaminophen (TYLENOL) 325 MG tablet Take 2  tablets (650 mg total) by mouth every 6 (six) hours as needed.   albuterol (PROVENTIL HFA;VENTOLIN HFA) 108 (90 Base) MCG/ACT inhaler Inhale 2 puffs into the lungs every 4 (four) hours as needed for wheezing or shortness of breath (cough).   aspirin EC 81 MG EC tablet Take 1 tablet (81 mg total) by mouth daily. Swallow whole.   beclomethasone (QVAR) 80 MCG/ACT inhaler Inhale 1 puff into the lungs 2 (two) times daily as needed (sob/wheezing).    cyclobenzaprine (FLEXERIL) 10 MG tablet Take 1 tablet (10 mg total) by mouth 2 (two) times daily as needed for muscle spasms.   diltiazem (CARTIA XT) 120 MG 24 hr capsule Take 1 capsule (120 mg total) by mouth daily.   EPINEPHrine (EPIPEN JR) 0.15 MG/0.3ML injection Inject 0.15 mg into the muscle daily as needed for anaphylaxis.    ergocalciferol (DRISDOL) 200 MCG/ML drops  Take by mouth daily.   Evolocumab (REPATHA SURECLICK) 631 MG/ML SOAJ Inject 1 pen into the skin every 14 (fourteen) days.   fluticasone (FLOVENT HFA) 44 MCG/ACT inhaler as needed.   furosemide (LASIX) 20 MG tablet Take 0.5 tablets (10 mg total) by mouth daily as needed for fluid retention or swelling   gabapentin (NEURONTIN) 600 MG tablet Take 600 mg by mouth at bedtime.   hydrocortisone (ANUSOL-HC) 2.5 % rectal cream Apply 1 application topically 2 (two) times daily for hemorrhoids for 14 days.   hydrocortisone (ANUSOL-HC) 2.5 % rectal cream as needed. hemorrhoids   hydrOXYzine (ATARAX/VISTARIL) 25 MG tablet Take 25 mg by mouth 3 (three) times daily as needed for anxiety or itching.    isosorbide mononitrate (IMDUR) 30 MG 24 hr tablet Take 1 tablet (30 mg total) by mouth daily.   ketoconazole (NIZORAL) 2 % cream Apply 1 application topically 2 (two) times daily for 14 days.   lidocaine (XYLOCAINE) 5 % ointment Apply 1 application topically as needed.   nitroGLYCERIN (NITROSTAT) 0.4 MG SL tablet PLACE 1 TABLET (0.4 MG TOTAL) UNDER THE TONGUE EVERY 5 (FIVE) MINUTES FOR 3 DOSES AS NEEDED FOR CHEST PAIN.   polyethylene glycol (MIRALAX / GLYCOLAX) 17 g packet Take 17 g by mouth every other day.   potassium chloride SA (KLOR-CON) 20 MEQ tablet Take 20 mEq by mouth daily.   promethazine-dextromethorphan (PROMETHAZINE-DM) 6.25-15 MG/5ML syrup Take 5 mLs by mouth every 6 (six) hours if needed   rosuvastatin (CRESTOR) 40 MG tablet Take 1 tablet (40 mg total) by mouth daily.   senna (SENOKOT) 8.6 MG TABS tablet Take 1 tablet by mouth as needed.   valACYclovir (VALTREX) 1000 MG tablet as needed.   [DISCONTINUED] Bulk Chemicals (POLYOX WSR-301) POWD as needed for constipation.   [DISCONTINUED] metoprolol tartrate (LOPRESSOR) 25 MG tablet TAKE 1 TABLET (25 MG TOTAL) BY MOUTH 2 (TWO) TIMES DAILY.   [DISCONTINUED] predniSONE (DELTASONE) 5 MG tablet Take 6 tabs (30 mg total) daily for 1 day, THEN 5 tabs (25 mg  total) 1 day, THEN 4 tabs (20 mg total) for 1 day, THEN 3 tabs (15 mg total) daily for 1 day, THEN 2 tabs (10 mg total) for 1 day, THEN 1 tab (5 mg total) for 1 day.     Allergies:   Pfizer-biontech covid-19 vacc [covid-19 mrna vacc (moderna)], Chocolate, Ivp dye [iodinated contrast media], Latex, Shellfish allergy, Fish-derived products, Peanut-containing drug products, and Soap   Social History   Socioeconomic History   Marital status: Single    Spouse name: Not on file   Number of children:  Not on file   Years of education: 14   Highest education level: Not on file  Occupational History   Not on file  Tobacco Use   Smoking status: Former    Packs/day: 1.00    Years: 20.00    Pack years: 20.00    Types: Cigarettes    Quit date: 07/14/2020    Years since quitting: 1.5   Smokeless tobacco: Never  Vaping Use   Vaping Use: Never used  Substance and Sexual Activity   Alcohol use: No    Alcohol/week: 0.0 standard drinks    Comment: occasional   Drug use: No   Sexual activity: Yes    Partners: Male    Birth control/protection: None  Other Topics Concern   Not on file  Social History Narrative   Not on file   Social Determinants of Health   Financial Resource Strain: Not on file  Food Insecurity: Not on file  Transportation Needs: Not on file  Physical Activity: Not on file  Stress: Not on file  Social Connections: Not on file    Family History: The patient's family history includes Cancer in her mother; Dementia in her father; Stroke in her father.  ROS:   Please see the history of present illness.     All other systems reviewed and are negative.  EKGs/Labs/Other Studies Reviewed:    The following studies were reviewed today:  EKG:   08/15/21: Sinus bradycardia with TWI 09/21/20: Sinus rhythm rate 61 with QTc 440 07/19/2020: Sinus Rhythm 84 QTc 508  Recent Labs: 12/05/2021: ALT 15; BUN 11; Creatinine, Ser 0.74; Hemoglobin 12.4; Platelets 246; Potassium 3.5;  Sodium 139  Recent Lipid Panel    Component Value Date/Time   CHOL 138 02/09/2022 0843   TRIG 44 02/09/2022 0843   HDL 89 02/09/2022 0843   CHOLHDL 1.6 02/09/2022 0843   CHOLHDL 3.1 07/15/2020 1730   VLDL 9 07/15/2020 1730   LDLCALC 38 02/09/2022 0843   Cardiac Event Monitoring: Date: 10/14/20 Results: Patient had a minimum heart rate of 49 bpm, maximum heart rate of 174 bpm, and average heart rate of 76 bpm. Predominant underlying rhythm was sinus rhythm. One run of supraventricular tachycardia occurred lasting 4 beats at longest with a max rate of 174 bpm at fastest. Isolated PACs were rare (<1.0%), with rare couplets present. Isolated PVCs were rare (<1.0%). No evidence of complete heart block . Triggered and diary events associated with sinus rhythm.   No malignant arrhythmias.   Transthoracic Echocardiogram: Date: 02/24/21 Results:  IMPRESSIONS   1. Left ventricular ejection fraction, by estimation, is 60 to 65%. The  left ventricle has normal function. The left ventricle has no regional  wall motion abnormalities. Left ventricular diastolic parameters are  consistent with Grade I diastolic  dysfunction (impaired relaxation). The average left ventricular global  longitudinal strain is -21.8 %. The global longitudinal strain is normal.   2. Right ventricular systolic function is normal. The right ventricular  size is normal.   3. The mitral valve is normal in structure. Mild mitral valve  regurgitation. No evidence of mitral stenosis.   4. The aortic valve is tricuspid. Aortic valve regurgitation is mild. No  aortic stenosis is present.   5. The inferior vena cava is normal in size with greater than 50%  respiratory variability, suggesting right atrial pressure of 3 mmHg.   Left/Right Heart Catheterizations: Date:07/16/20 Results: Prox RCA lesion is 50% stenosed. Vasospasm noted on this lesion which improved with IC  NTG. Mid RCA lesion is 99% stenosed. This was the  culprit lesion which was ulcerated. A drug-eluting stent was successfully placed using a STENT RESOLUTE ONYX 3.0X15, postdilated to 3.25 mm. Post intervention, there is a 0% residual stenosis. 1st Mrg lesion is 80% stenosed. A drug-eluting stent was successfully placed using a STENT RESOLUTE ONYX 2.5X18, postdilated to 3.0 mm. Post intervention, there is a 0% residual stenosis. Prox Cx lesion is 25% stenosed. 1st Diag lesion is 50% stenosed. Prox LAD lesion is 25% stenosed. The left ventricular systolic function is normal. LV end diastolic pressure is moderately elevated. The left ventricular ejection fraction is 55-65% by visual estimate. There is no aortic valve stenosis.  Physical Exam:    VS:  BP (!) 98/56   Pulse (!) 59   Ht '4\' 11"'$  (1.499 m)   Wt 207 lb (93.9 kg)   LMP 07/12/2016 (Approximate) Comment: 2-3 day cycle  SpO2 99%   BMI 41.81 kg/m     Wt Readings from Last 3 Encounters:  02/13/22 207 lb (93.9 kg)  12/05/21 203 lb (92.1 kg)  08/15/21 203 lb (92.1 kg)    Gen: no distress, morbid obesity   Neck: No JVD Cardiac: No Rubs or Gallops, systolic and diastolic Murmur, bradycardia, +2 radial pulses Respiratory: Clear to auscultation bilaterally, normal effort, normal  respiratory rate GI: Soft, nontender, non-distended  MS: No  edema;  moves all extremities Integument: Skin feels warm Neuro:  At time of evaluation, alert and oriented to person/place/time/situation  Psych: Normal affect, patient feels well  ASSESSMENT:    1. Coronary artery disease involving native coronary artery of native heart without angina pectoris   2. Nonrheumatic mitral valve regurgitation   3. Essential hypertension   4. Mixed hyperlipidemia   5. PVC (premature ventricular contraction)   6. Nonrheumatic aortic valve insufficiency     PLAN:    Coronary Artery Disease; Obstructive and possible vasospasm component HLD HTN, presently with hypotension Rectal bleedinging PVCs -  asymptomatic - anatomy: 50% pRCA, mRCA PCI, OM1 PCI, pLCX 25%, D1 50%, pLAD 25% - continue ASA 81 mg; OK to hold ASA for GI procedures if needed - continue statin, goal LDL < 55; will recheck lipids & ALT during echo; continue repatha - bradycardia and low BP; given potentially having a vasospastic component, keep CCB for now - continue nitrates; Imdur 30 mg PO daily (CP free) no PRN nitro needed  Mitral Valve Regurgitation Mild AI - mild to moderate regurgitation in 2022 - echo  for f/u - for now continue PRN lasix    One year me or APP   Medication Adjustments/Labs and Tests Ordered: Current medicines are reviewed at length with the patient today.  Concerns regarding medicines are outlined above.  Orders Placed This Encounter  Procedures   Hepatic function panel   Lipid panel   ECHOCARDIOGRAM COMPLETE    No orders of the defined types were placed in this encounter.    Patient Instructions  Medication Instructions:  Your physician has recommended you make the following change in your medication:  STOP: metoprolol tartrate  *If you need a refill on your cardiac medications before your next appointment, please call your pharmacy*   Lab Work: SAME DAY AS ECHO: FLP, LFT (nothing to eat or drink 8-12 hours prior except water and black coffee)  If you have labs (blood work) drawn today and your tests are completely normal, you will receive your results only by: [place] (if you have MyChart) OR  A paper copy in the mail If you have any lab test that is abnormal or we need to change your treatment, we will call you to review the results.   Testing/Procedures: Your physician has requested that you have an echocardiogram. Echocardiography is a painless test that uses sound waves to create images of your heart. It provides your doctor with information about the size and shape of your heart and how well your heart's chambers and valves are working. This procedure takes  approximately one hour. There are no restrictions for this procedure.    Follow-Up: At [facility], you and your health needs are our priority.  As part of our continuing mission to provide you with exceptional heart care, we have created designated Provider Care Teams.  These Care Teams include your primary Cardiologist (physician) and Advanced Practice Providers (APPs -  Physician Assistants and Nurse Practitioners) who all work together to provide you with the care you need, when you need it.  We recommend signing up for the patient portal called "MyChart".  Sign up information is provided on this After Visit Summary.  MyChart is used to connect with patients for Virtual Visits (Telemedicine).  Patients are able to view lab/test results, encounter notes, upcoming appointments, etc.  Non-urgent messages can be sent to your provider as well.   To learn more about what you can do with MyChart, go to [email or web address].    Your next appointment:   1 year(s)  The format for your next appointment:   In Person  Provider:   Werner Lean, MD , Sharrell Ku, Ermalinda Barrios, or Christen Bame   Other Instructions Please monitor your blood pressure   Important Information About Sugar         Signed, Werner Lean, MD  02/13/2022 2:21 PM    [place]

## 2022-02-13 ENCOUNTER — Encounter: Payer: Self-pay | Admitting: Internal Medicine

## 2022-02-13 ENCOUNTER — Ambulatory Visit: Payer: Medicare Other | Admitting: Internal Medicine

## 2022-02-13 VITALS — BP 98/56 | HR 59 | Ht 59.0 in | Wt 207.0 lb

## 2022-02-13 DIAGNOSIS — I10 Essential (primary) hypertension: Secondary | ICD-10-CM

## 2022-02-13 DIAGNOSIS — I340 Nonrheumatic mitral (valve) insufficiency: Secondary | ICD-10-CM

## 2022-02-13 DIAGNOSIS — I493 Ventricular premature depolarization: Secondary | ICD-10-CM | POA: Diagnosis not present

## 2022-02-13 DIAGNOSIS — E782 Mixed hyperlipidemia: Secondary | ICD-10-CM | POA: Diagnosis not present

## 2022-02-13 DIAGNOSIS — I351 Nonrheumatic aortic (valve) insufficiency: Secondary | ICD-10-CM

## 2022-02-13 DIAGNOSIS — I2510 Atherosclerotic heart disease of native coronary artery without angina pectoris: Secondary | ICD-10-CM | POA: Diagnosis not present

## 2022-02-13 NOTE — Patient Instructions (Addendum)
Medication Instructions:  Your physician has recommended you make the following change in your medication:  STOP: metoprolol tartrate  *If you need a refill on your cardiac medications before your next appointment, please call your pharmacy*   Lab Work: SAME DAY AS ECHO: FLP, LFT (nothing to eat or drink 8-12 hours prior except water and black coffee)  If you have labs (blood work) drawn today and your tests are completely normal, you will receive your results only by: [place] (if you have MyChart) OR A paper copy in the mail If you have any lab test that is abnormal or we need to change your treatment, we will call you to review the results.   Testing/Procedures: Your physician has requested that you have an echocardiogram. Echocardiography is a painless test that uses sound waves to create images of your heart. It provides your doctor with information about the size and shape of your heart and how well your heart's chambers and valves are working. This procedure takes approximately one hour. There are no restrictions for this procedure.    Follow-Up: At [facility], you and your health needs are our priority.  As part of our continuing mission to provide you with exceptional heart care, we have created designated Provider Care Teams.  These Care Teams include your primary Cardiologist (physician) and Advanced Practice Providers (APPs -  Physician Assistants and Nurse Practitioners) who all work together to provide you with the care you need, when you need it.  We recommend signing up for the patient portal called "MyChart".  Sign up information is provided on this After Visit Summary.  MyChart is used to connect with patients for Virtual Visits (Telemedicine).  Patients are able to view lab/test results, encounter notes, upcoming appointments, etc.  Non-urgent messages can be sent to your provider as well.   To learn more about what you can do with MyChart, go to  [email or web address].    Your next appointment:   1 year(s)  The format for your next appointment:   In Person  Provider:   Werner Lean, MD , Sharrell Ku, Ermalinda Barrios, or Christen Bame   Other Instructions Please monitor your blood pressure   Important Information About Sugar

## 2022-02-15 ENCOUNTER — Other Ambulatory Visit (HOSPITAL_COMMUNITY): Payer: Self-pay

## 2022-02-15 DIAGNOSIS — J028 Acute pharyngitis due to other specified organisms: Secondary | ICD-10-CM | POA: Diagnosis not present

## 2022-02-15 MED ORDER — AZITHROMYCIN 250 MG PO TABS
ORAL_TABLET | ORAL | 0 refills | Status: AC
  Filled 2022-02-15: qty 6, 5d supply, fill #0

## 2022-02-15 MED ORDER — PANTOPRAZOLE SODIUM 20 MG PO TBEC
20.0000 mg | DELAYED_RELEASE_TABLET | Freq: Every day | ORAL | 1 refills | Status: DC
  Filled 2022-02-15: qty 30, 30d supply, fill #0
  Filled 2022-06-22: qty 30, 30d supply, fill #1

## 2022-02-15 MED ORDER — BUDESONIDE-FORMOTEROL FUMARATE 80-4.5 MCG/ACT IN AERO
2.0000 | INHALATION_SPRAY | Freq: Every day | RESPIRATORY_TRACT | 0 refills | Status: DC
  Filled 2022-02-15: qty 10.2, 60d supply, fill #0

## 2022-02-16 DIAGNOSIS — Z1231 Encounter for screening mammogram for malignant neoplasm of breast: Secondary | ICD-10-CM | POA: Diagnosis not present

## 2022-02-28 ENCOUNTER — Other Ambulatory Visit: Payer: Self-pay | Admitting: Internal Medicine

## 2022-02-28 ENCOUNTER — Other Ambulatory Visit (HOSPITAL_COMMUNITY): Payer: Self-pay

## 2022-02-28 MED ORDER — ROSUVASTATIN CALCIUM 40 MG PO TABS
40.0000 mg | ORAL_TABLET | Freq: Every day | ORAL | 3 refills | Status: DC
  Filled 2022-02-28: qty 90, 90d supply, fill #0
  Filled 2022-06-21: qty 90, 90d supply, fill #1
  Filled 2022-11-21: qty 90, 90d supply, fill #2

## 2022-03-03 ENCOUNTER — Ambulatory Visit (HOSPITAL_COMMUNITY): Payer: Medicare Other | Attending: Cardiology

## 2022-03-03 ENCOUNTER — Other Ambulatory Visit: Payer: Medicare Other

## 2022-03-03 DIAGNOSIS — I493 Ventricular premature depolarization: Secondary | ICD-10-CM

## 2022-03-03 DIAGNOSIS — I10 Essential (primary) hypertension: Secondary | ICD-10-CM | POA: Diagnosis not present

## 2022-03-03 DIAGNOSIS — I351 Nonrheumatic aortic (valve) insufficiency: Secondary | ICD-10-CM | POA: Diagnosis not present

## 2022-03-03 DIAGNOSIS — I340 Nonrheumatic mitral (valve) insufficiency: Secondary | ICD-10-CM | POA: Diagnosis not present

## 2022-03-03 DIAGNOSIS — I2510 Atherosclerotic heart disease of native coronary artery without angina pectoris: Secondary | ICD-10-CM | POA: Diagnosis not present

## 2022-03-03 DIAGNOSIS — E782 Mixed hyperlipidemia: Secondary | ICD-10-CM | POA: Diagnosis not present

## 2022-03-03 LAB — HEPATIC FUNCTION PANEL
ALT: 15 IU/L (ref 0–32)
AST: 17 IU/L (ref 0–40)
Albumin: 4.1 g/dL (ref 3.8–4.9)
Alkaline Phosphatase: 64 IU/L (ref 44–121)
Bilirubin Total: 0.3 mg/dL (ref 0.0–1.2)
Bilirubin, Direct: 0.11 mg/dL (ref 0.00–0.40)
Total Protein: 7 g/dL (ref 6.0–8.5)

## 2022-03-03 LAB — LIPID PANEL
Chol/HDL Ratio: 1.5 ratio (ref 0.0–4.4)
Cholesterol, Total: 131 mg/dL (ref 100–199)
HDL: 86 mg/dL (ref >39)
LDL Chol Calc (NIH): 35 mg/dL (ref 0–99)
Triglycerides: 40 mg/dL (ref 0–149)
VLDL Cholesterol Cal: 10 mg/dL (ref 5–40)

## 2022-03-03 LAB — ECHOCARDIOGRAM COMPLETE
Area-P 1/2: 4.63 cm2
MV M vel: 5.48 m/s
MV Peak grad: 120.1 mmHg
P 1/2 time: 473 msec
S' Lateral: 3.5 cm

## 2022-03-06 ENCOUNTER — Telehealth: Payer: Self-pay | Admitting: Internal Medicine

## 2022-03-06 NOTE — Telephone Encounter (Signed)
Attempted return phone call to pt and left voicemail message to contact office at [phone].  Advised RN will also send a MyChart message.

## 2022-03-06 NOTE — Telephone Encounter (Signed)
Patient is calling to get her results °

## 2022-03-12 DIAGNOSIS — W57XXXA Bitten or stung by nonvenomous insect and other nonvenomous arthropods, initial encounter: Secondary | ICD-10-CM | POA: Diagnosis not present

## 2022-03-12 DIAGNOSIS — S70362A Insect bite (nonvenomous), left thigh, initial encounter: Secondary | ICD-10-CM | POA: Diagnosis not present

## 2022-03-13 ENCOUNTER — Telehealth: Payer: Self-pay

## 2022-03-13 DIAGNOSIS — I351 Nonrheumatic aortic (valve) insufficiency: Secondary | ICD-10-CM

## 2022-03-13 NOTE — Telephone Encounter (Signed)
The patient has been notified of the result and verbalized understanding.  All questions (if any) were answered. Precious Gilding, RN 03/13/2022 12:31 PM   Order placed for Echo due June 2024 prior to OV with Dr. Gasper Sells.

## 2022-03-13 NOTE — Telephone Encounter (Signed)
-----   Message from Werner Lean, MD sent at 03/07/2022  4:31 PM EDT ----- Results: Similar AI Plan: Echo in June 2024  Werner Lean, MD

## 2022-03-20 DIAGNOSIS — H35033 Hypertensive retinopathy, bilateral: Secondary | ICD-10-CM | POA: Diagnosis not present

## 2022-03-24 ENCOUNTER — Other Ambulatory Visit (HOSPITAL_COMMUNITY): Payer: Self-pay

## 2022-04-14 ENCOUNTER — Emergency Department (HOSPITAL_COMMUNITY)
Admission: EM | Admit: 2022-04-14 | Discharge: 2022-04-14 | Disposition: A | Discharge status: Home / Self Care | Payer: Medicare Other | Attending: Emergency Medicine | Admitting: Emergency Medicine

## 2022-04-14 ENCOUNTER — Encounter (HOSPITAL_COMMUNITY): Payer: Self-pay | Admitting: Emergency Medicine

## 2022-04-14 ENCOUNTER — Other Ambulatory Visit: Payer: Self-pay

## 2022-04-14 ENCOUNTER — Emergency Department (HOSPITAL_COMMUNITY): Payer: Medicare Other

## 2022-04-14 DIAGNOSIS — Z91040 Latex allergy status: Secondary | ICD-10-CM | POA: Insufficient documentation

## 2022-04-14 DIAGNOSIS — R600 Localized edema: Secondary | ICD-10-CM | POA: Insufficient documentation

## 2022-04-14 DIAGNOSIS — Z91010 Allergy to peanuts: Secondary | ICD-10-CM | POA: Diagnosis not present

## 2022-04-14 DIAGNOSIS — I10 Essential (primary) hypertension: Secondary | ICD-10-CM | POA: Diagnosis not present

## 2022-04-14 DIAGNOSIS — R0602 Shortness of breath: Secondary | ICD-10-CM | POA: Diagnosis not present

## 2022-04-14 DIAGNOSIS — R200 Anesthesia of skin: Secondary | ICD-10-CM | POA: Diagnosis present

## 2022-04-14 DIAGNOSIS — R202 Paresthesia of skin: Secondary | ICD-10-CM | POA: Insufficient documentation

## 2022-04-14 DIAGNOSIS — Z7982 Long term (current) use of aspirin: Secondary | ICD-10-CM | POA: Diagnosis not present

## 2022-04-14 DIAGNOSIS — E876 Hypokalemia: Secondary | ICD-10-CM | POA: Insufficient documentation

## 2022-04-14 LAB — BASIC METABOLIC PANEL
Anion gap: 7 (ref 5–15)
BUN: 7 mg/dL (ref 6–20)
CO2: 25 mmol/L (ref 22–32)
Calcium: 8.9 mg/dL (ref 8.9–10.3)
Chloride: 107 mmol/L (ref 98–111)
Creatinine, Ser: 0.75 mg/dL (ref 0.44–1.00)
GFR, Estimated: >60 mL/min (ref >60)
Glucose, Bld: 96 mg/dL (ref 70–99)
Potassium: 3.2 mmol/L — ABNORMAL LOW (ref 3.5–5.1)
Sodium: 139 mmol/L (ref 135–145)

## 2022-04-14 LAB — CBC WITH DIFFERENTIAL/PLATELET
Abs Immature Granulocytes: 0.01 10*3/uL (ref 0.00–0.07)
Basophils Absolute: 0 10*3/uL (ref 0.0–0.1)
Basophils Relative: 0 %
Eosinophils Absolute: 0.2 10*3/uL (ref 0.0–0.5)
Eosinophils Relative: 3 %
HCT: 38.1 % (ref 36.0–46.0)
Hemoglobin: 12.1 g/dL (ref 12.0–15.0)
Immature Granulocytes: 0 %
Lymphocytes Relative: 42 %
Lymphs Abs: 2.5 10*3/uL (ref 0.7–4.0)
MCH: 27.9 pg (ref 26.0–34.0)
MCHC: 31.8 g/dL (ref 30.0–36.0)
MCV: 87.8 fL (ref 80.0–100.0)
Monocytes Absolute: 0.6 10*3/uL (ref 0.1–1.0)
Monocytes Relative: 10 %
Neutro Abs: 2.8 10*3/uL (ref 1.7–7.7)
Neutrophils Relative %: 45 %
Platelets: 229 10*3/uL (ref 150–400)
RBC: 4.34 MIL/uL (ref 3.87–5.11)
RDW: 12.7 % (ref 11.5–15.5)
WBC: 6.1 10*3/uL (ref 4.0–10.5)
nRBC: 0 % (ref 0.0–0.2)

## 2022-04-14 LAB — MAGNESIUM: Magnesium: 1.9 mg/dL (ref 1.7–2.4)

## 2022-04-14 LAB — TROPONIN I (HIGH SENSITIVITY)
Troponin I (High Sensitivity): 3 ng/L (ref <18)
Troponin I (High Sensitivity): 3 ng/L (ref <18)

## 2022-04-14 LAB — BRAIN NATRIURETIC PEPTIDE: B Natriuretic Peptide: 25.6 pg/mL (ref 0.0–100.0)

## 2022-04-14 MED ORDER — POTASSIUM CHLORIDE 20 MEQ PO PACK
40.0000 meq | PACK | Freq: Once | ORAL | Status: AC
Start: 2022-04-14 — End: 2022-04-14
  Administered 2022-04-14: 40 meq via ORAL
  Filled 2022-04-14: qty 2

## 2022-04-14 MED ORDER — POTASSIUM CHLORIDE ER 10 MEQ PO TBCR: 10.0000 meq | EXTENDED_RELEASE_TABLET | Freq: Every day | ORAL | 0 refills | Status: DC

## 2022-04-14 NOTE — ED Triage Notes (Addendum)
Pt comes in reporting "pins and needles" sensation to bilateral hands.  Also swelling to bilateral lower legs  Pt states she had these feelings yesterday but they resolved and came back today.  No neuro deficits noted in triage.  Pt does describe a "swimming" feeling in her head as well. No falls or frank dizziness.  No CP, n/v/d, or shob.

## 2022-04-14 NOTE — ED Provider Triage Note (Signed)
Emergency Medicine Provider Triage Evaluation Note  Wendy [name] , a 56 y.o. female  was evaluated in triage.  Pt complains of swelling to bilateral ankles. States this has been ongoing for a few days. She has had some shortness of breath as well. States that today began feeling pins and needles sensation in her fingers bilaterally and feels like she has "a wave in her head." She denies feeling dizzy or having visual changes. Denies falling or being off balance. Denies weakness in her extremities or sensation changes to her face. No chest pain, palpitations, N/V/D or fevers  Review of Systems  Positive:  Negative:   Physical Exam  BP 126/68 (BP Location: Right Arm)   Pulse 71   Temp 98.1 F (36.7 C) (Oral)   Resp 15   LMP 07/12/2016 (Approximate) Comment: 2-3 day cycle  SpO2 99%  Gen:   Awake, no distress   Resp:  Normal effort  MSK:   Moves extremities without difficulty  Other:  Non focal neurological exam. No nystagmus. No pronator. Equal strength. CN II-XII intact. 1+ BLE edema  Medical Decision Making  Medically screening exam initiated at 3:38 PM.  Appropriate orders placed.  Wendy [name] was informed that the remainder of the evaluation will be completed by another provider, this initial triage assessment does not replace that evaluation, and the importance of remaining in the ED until their evaluation is complete.     Mickie Hillier, PA-C 04/14/22 1544

## 2022-04-14 NOTE — Discharge Instructions (Signed)
You are seen in the emergency department today for generally feeling unwell and having numbness and tingling in your hands and swelling in your legs.  Overall, your work-up including physical exam, labs, EKG and chest x-ray were all reassuring.  You are safe to be discharged home and follow-up with your primary care provider.  Your potassium was slightly low so we have repleted in the ER and given you 5 days of daily tablets to bring your potassium up as this can lead to tingling sensations in your hands.  We also think a lot of your symptoms are related to the Lasix which you have started taking.  If you continue to take it, take the potassium.  Also, make sure you are drinking plenty of fluids and keep yourself from getting dehydrated.  Get yourself compression stockings and elevate your legs whenever you are sitting down or resting.  Come back to the ER if you have any new or concerning symptoms such as slurred speech, inability to use your arms or legs, severe chest pain, worsening shortness of breath, fainting, or any other symptoms concerning to you.

## 2022-04-14 NOTE — ED Provider Notes (Signed)
[facility] EMERGENCY DEPARTMENT Provider Note   CSN: [id number] Arrival date & time: 04/14/22  1516     History {Add pertinent medical, surgical, social history, OB history to HPI:1} Chief Complaint  Patient presents with   Leg Swelling   Numbness    Wendy [name] is a 55 y.o. female.  With PMH of anxiety, depression, migraines, HTN, PVCs who presents with numbness and tingling sensation to bilateral hands as well as swelling to bilateral lower extremities.  HPI     Home Medications Prior to Admission medications   Medication Sig Start Date End Date Taking? Authorizing Provider  acetaminophen (TYLENOL) 325 MG tablet Take 2 tablets (650 mg total) by mouth every 6 (six) hours as needed. 10/01/20   Mesner, Corene Cornea, MD  albuterol (PROVENTIL HFA;VENTOLIN HFA) 108 (90 Base) MCG/ACT inhaler Inhale 2 puffs into the lungs every 4 (four) hours as needed for wheezing or shortness of breath (cough). 11/20/16   Street, [place], PA-C  aspirin EC 81 MG EC tablet Take 1 tablet (81 mg total) by mouth daily. Swallow whole. 07/17/20   Bhagat, Crista Luria, PA  beclomethasone (QVAR) 80 MCG/ACT inhaler Inhale 1 puff into the lungs 2 (two) times daily as needed (sob/wheezing).     [provider]  budesonide-formoterol (SYMBICORT) 80-4.5 MCG/ACT inhaler Inhale 2 puffs into the lungs daily. 02/15/22     cyclobenzaprine (FLEXERIL) 10 MG tablet Take 1 tablet (10 mg total) by mouth 2 (two) times daily as needed for muscle spasms. 10/01/20   Mesner, Corene Cornea, MD  diltiazem (CARTIA XT) 120 MG 24 hr capsule Take 1 capsule (120 mg total) by mouth daily. 02/01/22   Chandrasekhar, Lyda Kalata A, MD  EPINEPHrine (EPIPEN JR) 0.15 MG/0.3ML injection Inject 0.15 mg into the muscle daily as needed for anaphylaxis.     [provider]  ergocalciferol (DRISDOL) 200 MCG/ML drops Take by mouth daily.    [provider]  Evolocumab (REPATHA SURECLICK) 287 MG/ML SOAJ Inject 1 pen into the skin  every 14 (fourteen) days. 11/29/21   Werner Lean, MD  fluticasone (FLOVENT HFA) 44 MCG/ACT inhaler as needed. 02/03/21   [provider]  furosemide (LASIX) 20 MG tablet Take 0.5 tablets (10 mg total) by mouth daily as needed for fluid retention or swelling 12/06/21   Chandrasekhar, Mahesh A, MD  gabapentin (NEURONTIN) 600 MG tablet Take 600 mg by mouth at bedtime.    [provider]  hydrocortisone (ANUSOL-HC) 2.5 % rectal cream Apply 1 application topically 2 (two) times daily for hemorrhoids for 14 days. 12/09/21     hydrocortisone (ANUSOL-HC) 2.5 % rectal cream as needed. hemorrhoids 12/09/21   [provider]  hydrOXYzine (ATARAX/VISTARIL) 25 MG tablet Take 25 mg by mouth 3 (three) times daily as needed for anxiety or itching.  10/09/19   [provider]  isosorbide mononitrate (IMDUR) 30 MG 24 hr tablet Take 1 tablet (30 mg total) by mouth daily. 12/15/21   Chandrasekhar, Mahesh A, MD  ketoconazole (NIZORAL) 2 % cream Apply 1 application topically 2 (two) times daily for 14 days. 12/09/21     lidocaine (XYLOCAINE) 5 % ointment Apply 1 application topically as needed. 09/02/21   Sherrell Puller, PA-C  nitroGLYCERIN (NITROSTAT) 0.4 MG SL tablet PLACE 1 TABLET (0.4 MG TOTAL) UNDER THE TONGUE EVERY 5 (FIVE) MINUTES FOR 3 DOSES AS NEEDED FOR CHEST PAIN. 08/11/21   Chandrasekhar, Mahesh A, MD  pantoprazole (PROTONIX) 20 MG tablet Take 1 tablet (20 mg total) by mouth  daily. 02/15/22     polyethylene glycol (MIRALAX / GLYCOLAX) 17 g packet Take 17 g by mouth every other day.    [provider]  potassium chloride SA (KLOR-CON) 20 MEQ tablet Take 20 mEq by mouth daily.    [provider]  promethazine-dextromethorphan (PROMETHAZINE-DM) 6.25-15 MG/5ML syrup Take 5 mLs by mouth every 6 (six) hours if needed 02/08/22     rosuvastatin (CRESTOR) 40 MG tablet Take 1 tablet (40 mg total) by mouth daily. 02/28/22   Werner Lean, MD  senna (SENOKOT)  8.6 MG TABS tablet Take 1 tablet by mouth as needed.    [provider]  valACYclovir (VALTREX) 1000 MG tablet as needed.    [provider]      Allergies    Pfizer-biontech covid-19 vacc [phone] mrna vacc (moderna)], Chocolate, Ivp dye [iodinated contrast media], Latex, Shellfish allergy, Fish-derived products, Peanut-containing drug products, and Soap    Review of Systems   Review of Systems  Physical Exam Updated Vital Signs BP (!) 117/55   Pulse 63   Temp 97.7 F (36.5 C) (Oral)   Resp 12   LMP 07/12/2016 (Approximate) Comment: 2-3 day cycle  SpO2 100%  Physical Exam  ED Results / Procedures / Treatments   Labs (all labs ordered are listed, but only abnormal results are displayed) Labs Reviewed  BASIC METABOLIC PANEL - Abnormal; Notable for the following components:      Result Value   Potassium 3.2 (*)    All other components within normal limits  BRAIN NATRIURETIC PEPTIDE  CBC WITH DIFFERENTIAL/PLATELET  MAGNESIUM  TROPONIN I (HIGH SENSITIVITY)  TROPONIN I (HIGH SENSITIVITY)    EKG EKG Interpretation  Date/Time:  Friday April 14 2022 15:34:02 EDT Ventricular Rate:  59 PR Interval:  162 QRS Duration: 78 QT Interval:  442 QTC Calculation: 437 R Axis:   49 Text Interpretation: Sinus bradycardia Otherwise normal ECG When compared with ECG of 18-Dec-2020 14:28, PREVIOUS ECG IS PRESENT Confirmed by Georgina Snell ([id number]) on 04/14/2022 7:14:17 PM  Radiology DG Chest 2 View  Result Date: 04/14/2022 CLINICAL DATA:  Shortness of breath EXAM: CHEST - 2 VIEW COMPARISON:  12/09/2018 CT FINDINGS: Cardiac shadow is within normal limits. Aortic calcifications are noted. Lungs are well aerated bilaterally. No focal infiltrate or sizable effusion is seen. No bony abnormality is noted. IMPRESSION: No acute abnormality noted. Electronically Signed   By: Inez Catalina M.D.   On: 04/14/2022 16:26    Procedures Procedures  {Document cardiac monitor, telemetry  assessment procedure when appropriate:1}  Medications Ordered in ED Medications  potassium chloride (KLOR-CON) packet 40 mEq (has no administration in time range)    ED Course/ Medical Decision Making/ A&P                           Medical Decision Making Wendy [name] is a 56 y.o. female.  With PMH of anxiety, depression, migraines, HTN, PVCs who presents with numbness and tingling sensation to bilateral hands as well as swelling to bilateral lower extremities.  Patient complaining of some lightheadedness and intermittent paresthesias in the setting of increased lower extremity swelling and recently starting Lasix.  I suspect most of her symptoms are secondary to Lasix use as her symptoms began after starting Lasix over the past 3 to 4 days.  I suspect some mild dehydration from polyuria from Lasix.  Additionally, her labs were consistent with Lasix use with mild hypokalemia 3.2.  No other acute electrolyte abnormalities.  Her EKG was sinus bradycardia with no ischemic changes.  In addition, her troponins have been reassuring and normal 3 and rpt 3.  No concern for atypical ACS.  Her chest x-ray showed no signs of fluid overload and she is not hypoxic with no signs of pulmonary edema, no consolidation concerning for pneumonia, no pneumothorax or cardiomegaly.  I suspect the intermittent paresthesias are likely from mild hypokalemia.  She has no focal neurologic deficits concerning for CVA or intracranial pathology.  Her lower extremity edema is equal in nature, no concern for DVT.  No tenderness or erythema or skin changes concerning for cellulitis.  Suspect dependent edema from long time standing.  Advised using compression stockings and elevating legs and will discharge with short course of potassium and advise close follow-up with PCP for repeat blood work check.  Strict return precaution discussed.  Safe for discharge home.  Amount and/or Complexity of Data Reviewed Labs:  ordered.  Risk Prescription drug management.   ***  {Document critical care time when appropriate:1} {Document review of labs and clinical decision tools ie heart score, Chads2Vasc2 etc:1}  {Document your independent review of radiology images, and any outside records:1} {Document your discussion with family members, caretakers, and with consultants:1} {Document social determinants of health affecting pt's care:1} {Document your decision making why or why not admission, treatments were needed:1} Final Clinical Impression(s) / ED Diagnoses Final diagnoses:  None    Rx / DC Orders ED Discharge Orders     None

## 2022-04-19 ENCOUNTER — Encounter (INDEPENDENT_AMBULATORY_CARE_PROVIDER_SITE_OTHER): Payer: Self-pay

## 2022-04-24 ENCOUNTER — Other Ambulatory Visit (HOSPITAL_COMMUNITY): Payer: Self-pay

## 2022-05-16 IMAGING — DX DG ELBOW COMPLETE 3+V*R*
4 series · 4 of 4 positions shown · non-contrast
Comparison: None.

CLINICAL DATA: Elbow pain

EXAM:
RIGHT ELBOW - COMPLETE 3+ VIEW

[elbow ap]
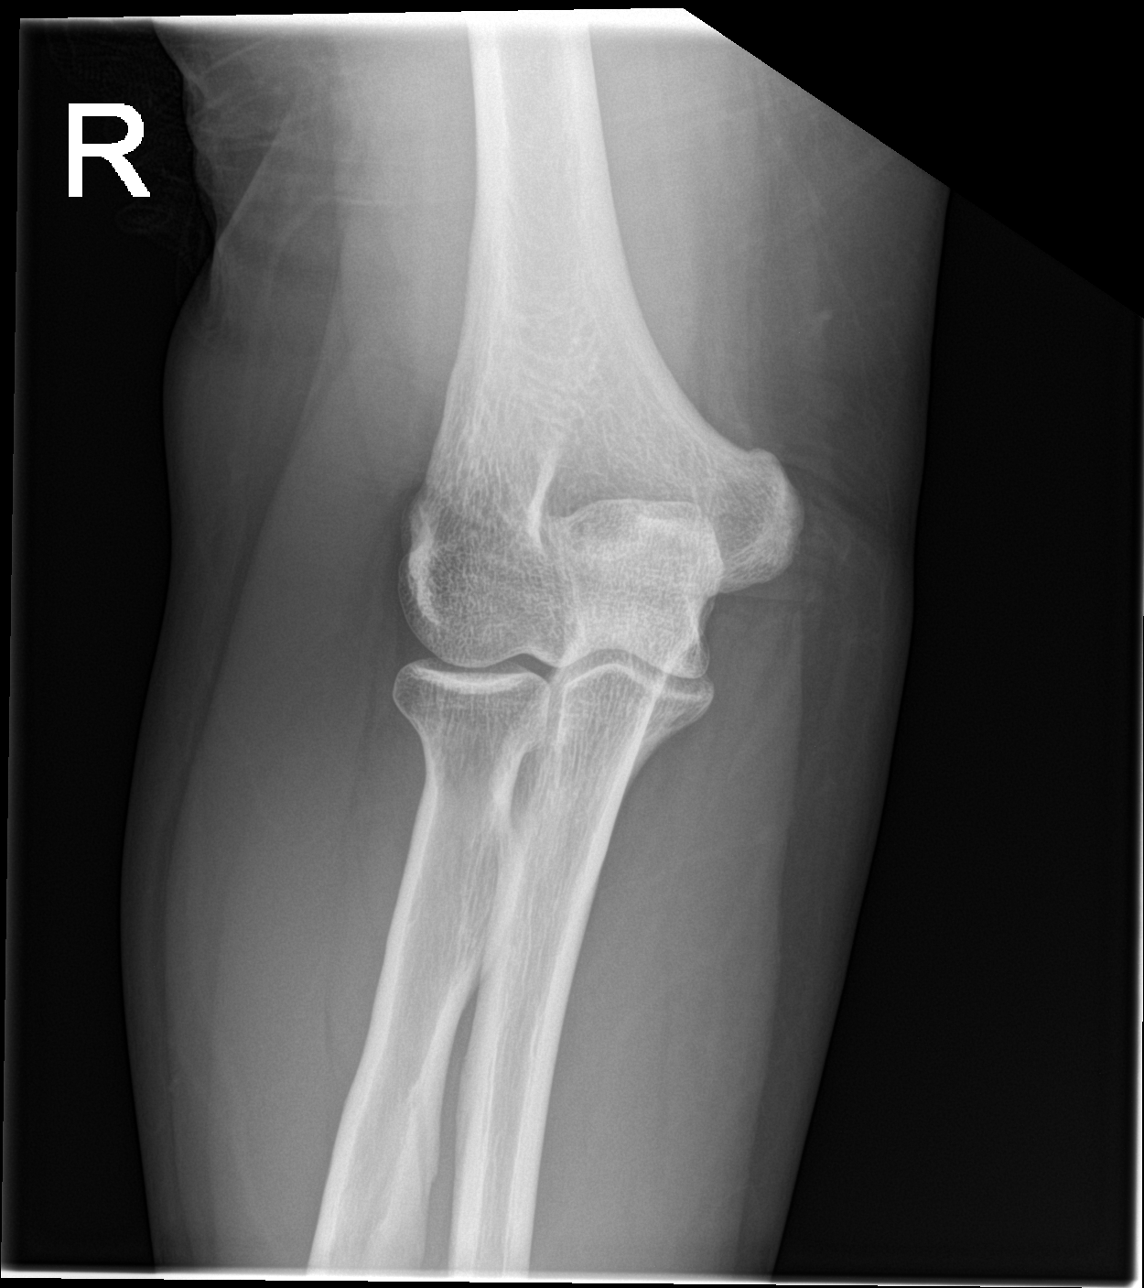

[elbow obl (1 of 2)]
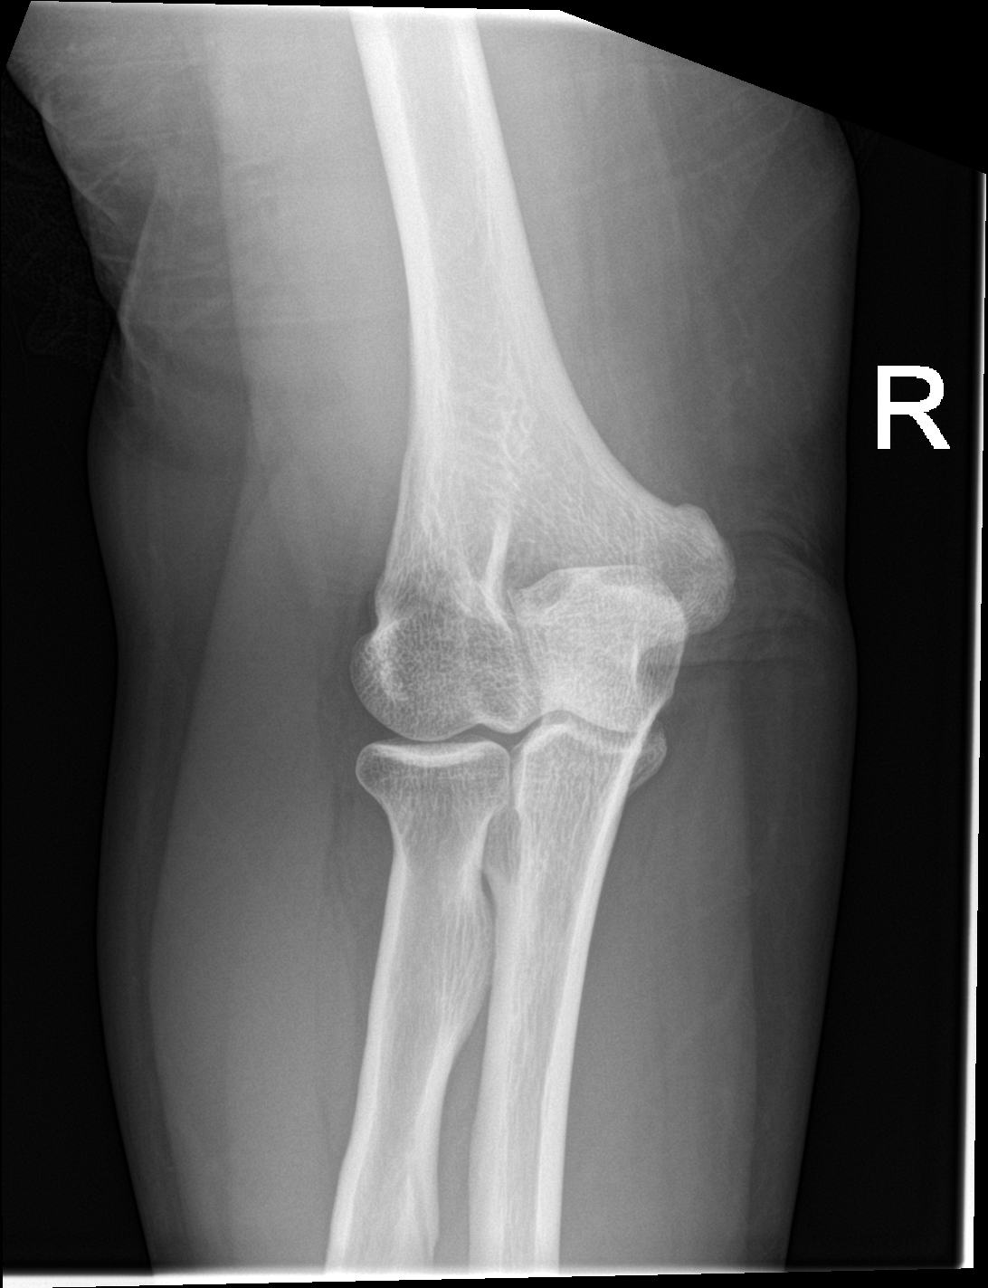

[elbow lat]
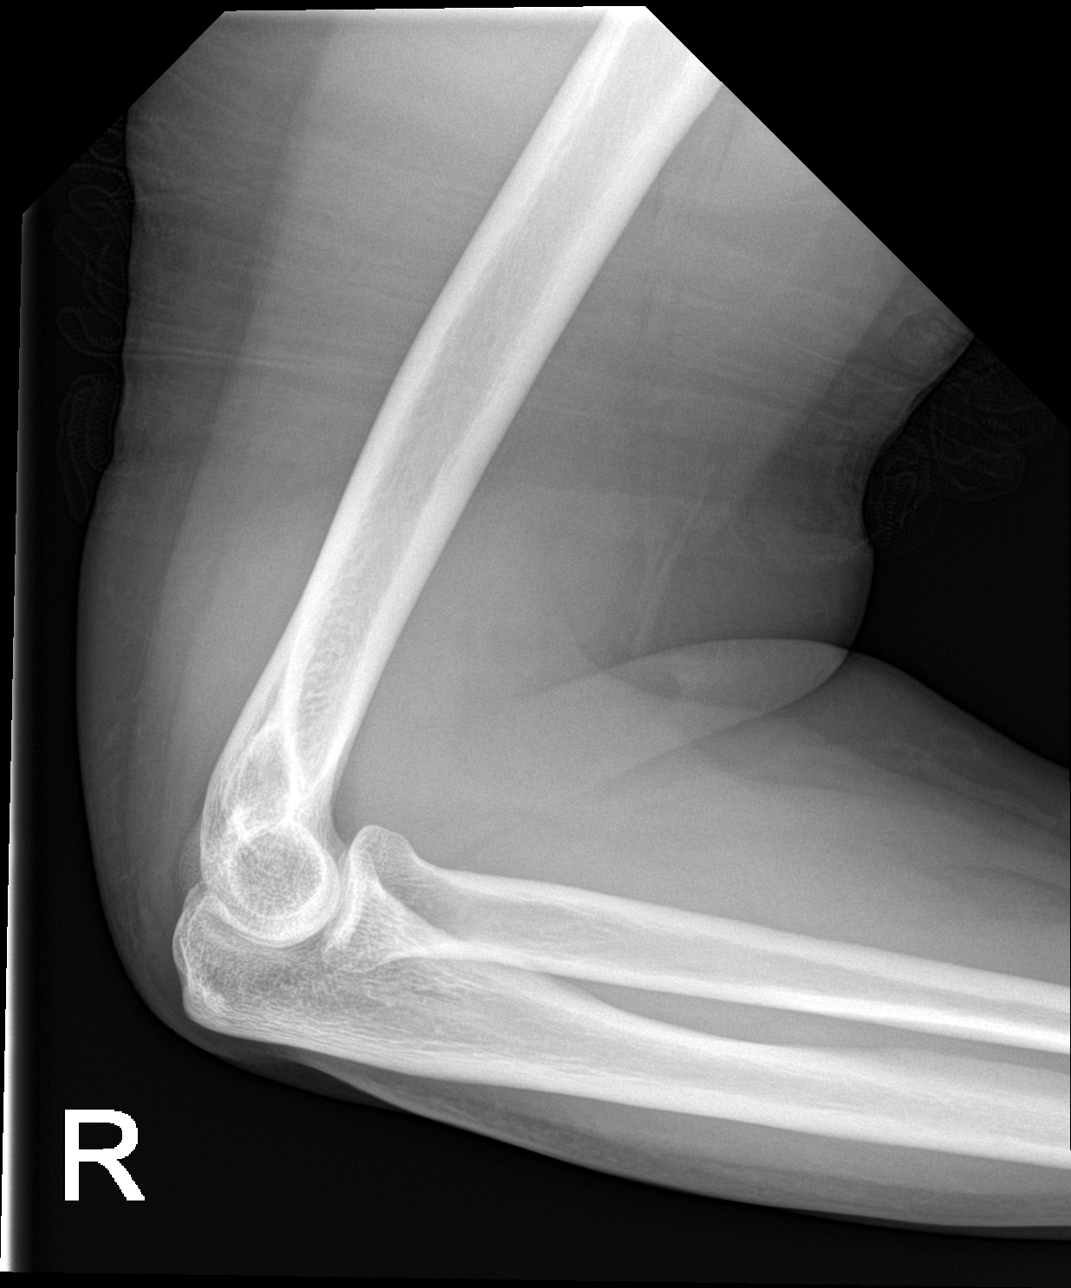

[elbow obl (2 of 2)]
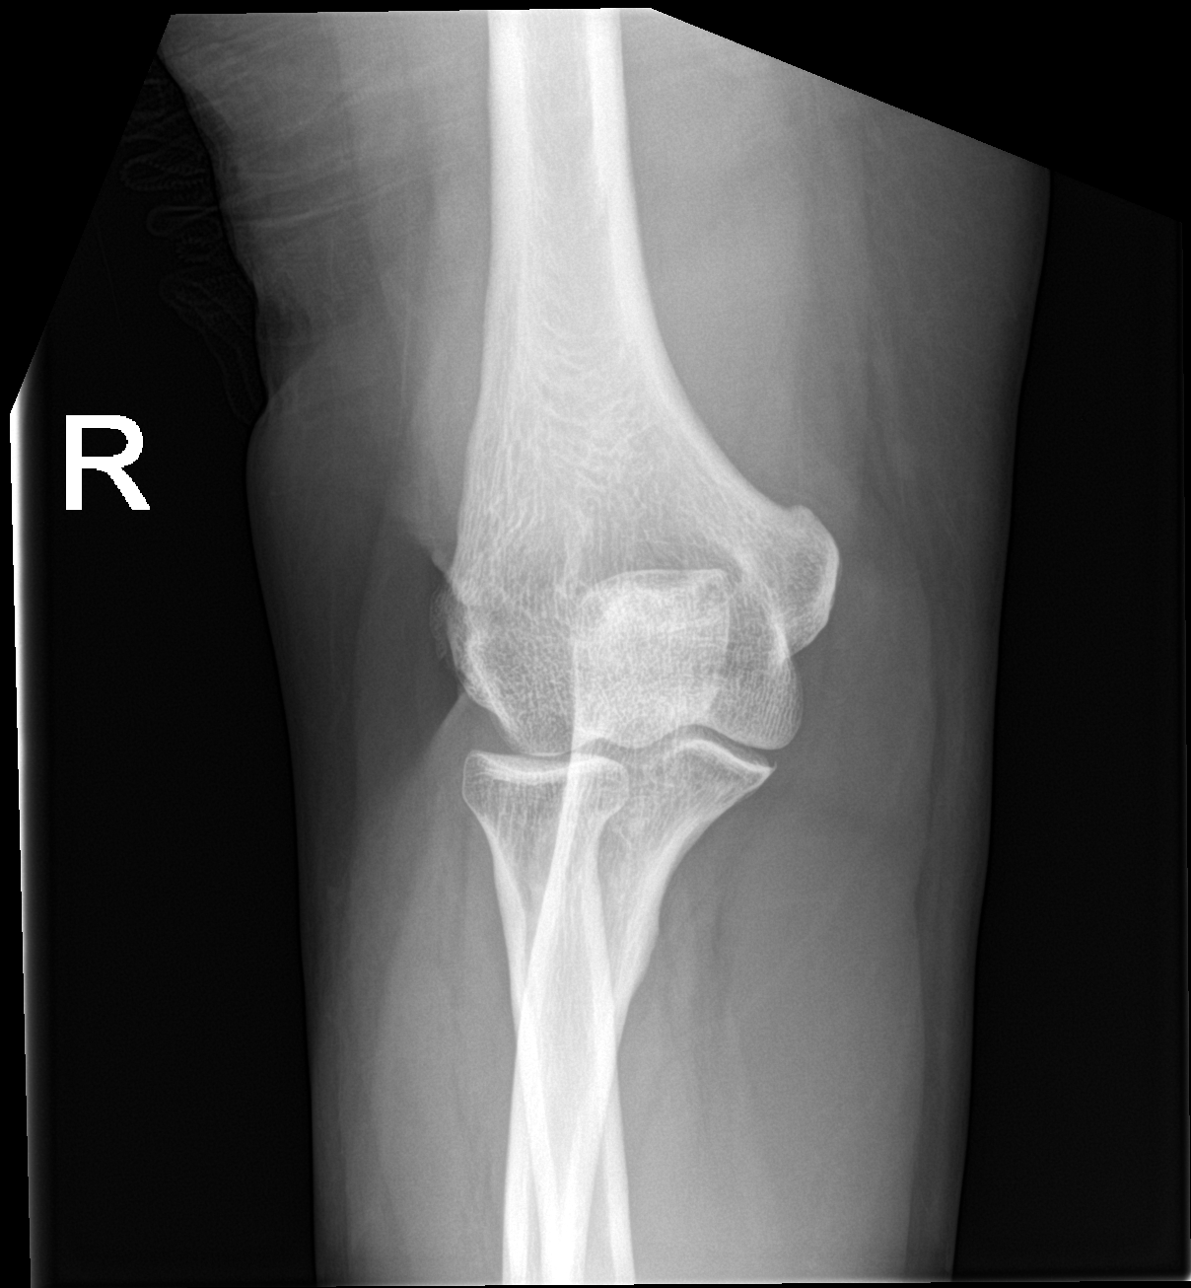

[4 of 4 positions shown; findings below may reference images not displayed]

FINDINGS: There is no evidence of fracture, dislocation, or joint effusion.
There is no evidence of arthropathy or other focal bone abnormality.
Soft tissues are unremarkable.
IMPRESSION: No acute osseous abnormality identified.

## 2022-05-16 IMAGING — DX DG HAND COMPLETE 3+V*R*
3 series · 3 of 3 positions shown · non-contrast
Comparison: None.

CLINICAL DATA: Hand pain

EXAM:
RIGHT HAND - COMPLETE 3+ VIEW

[hand pa]
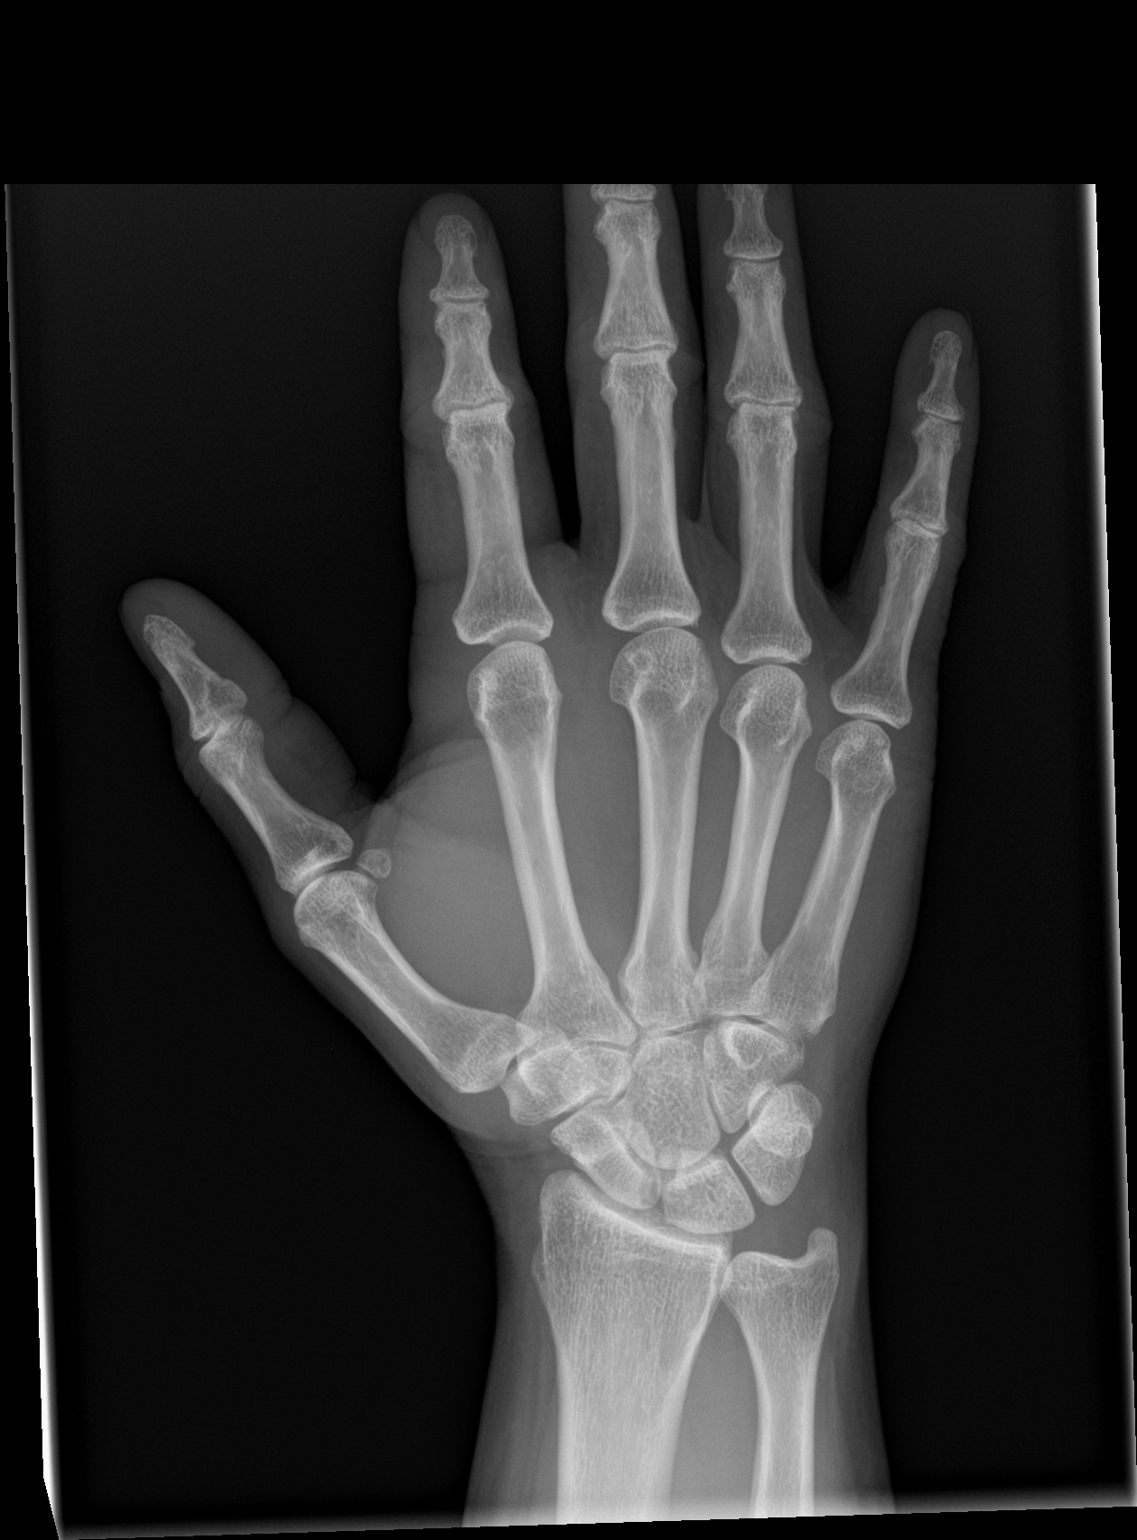

[hand obl]
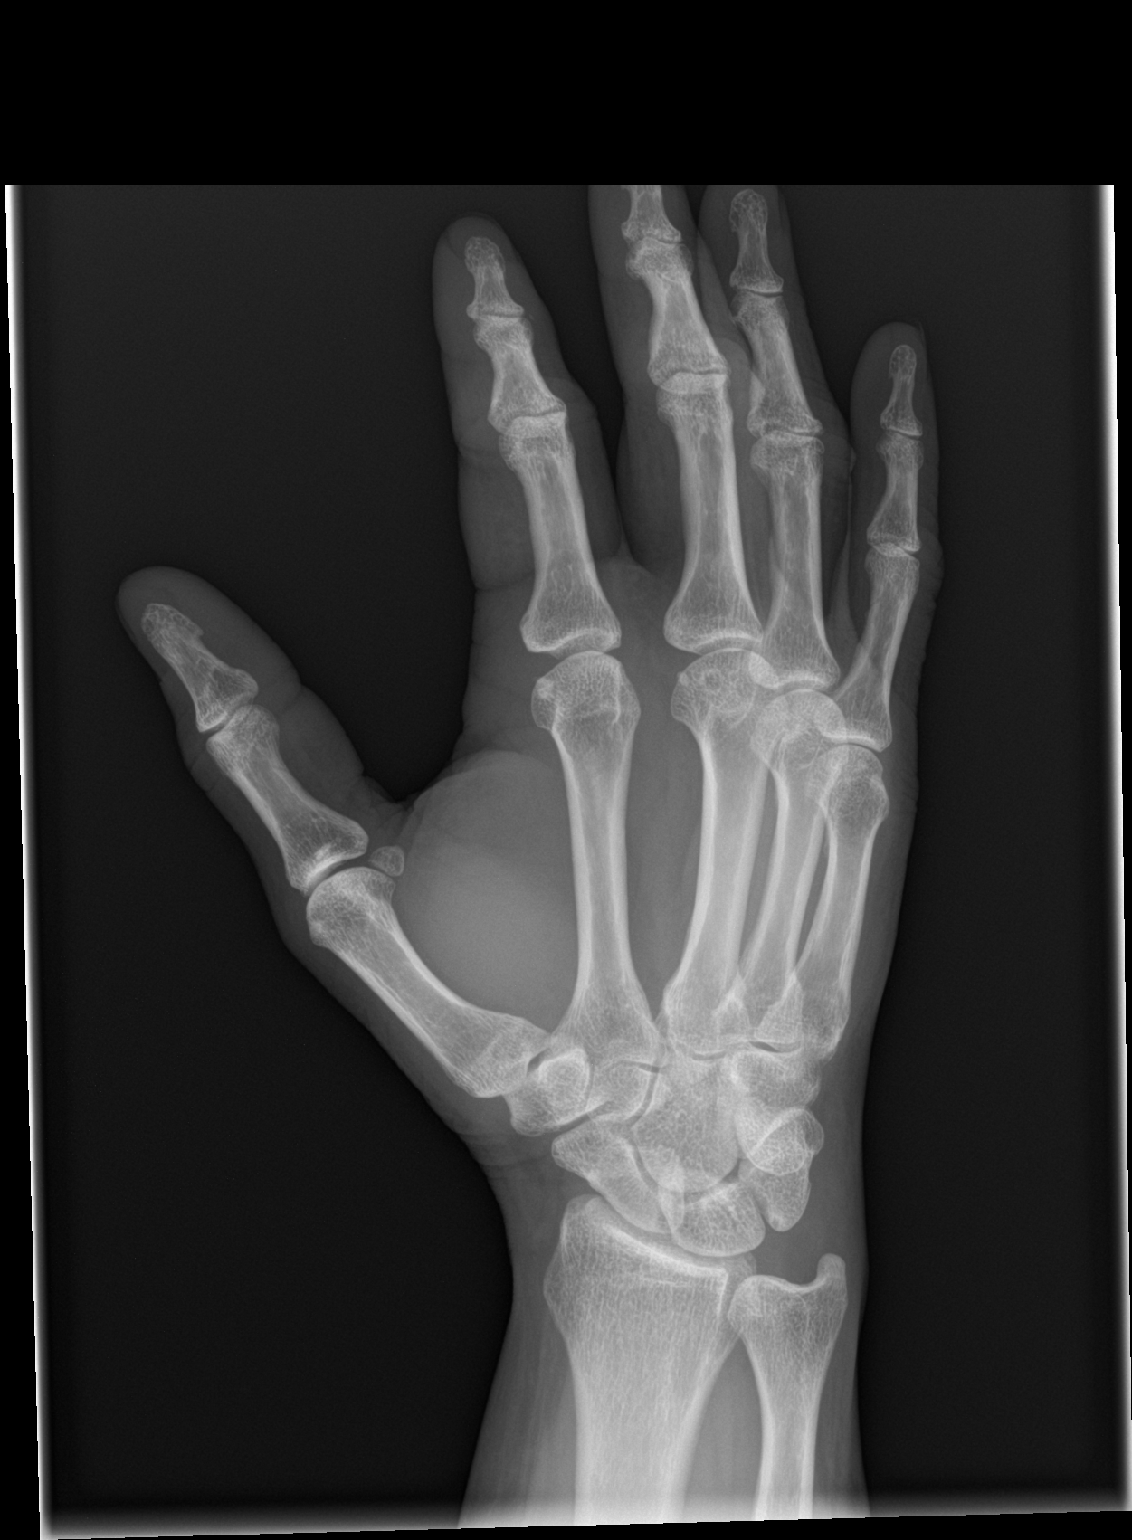

[hand lat]
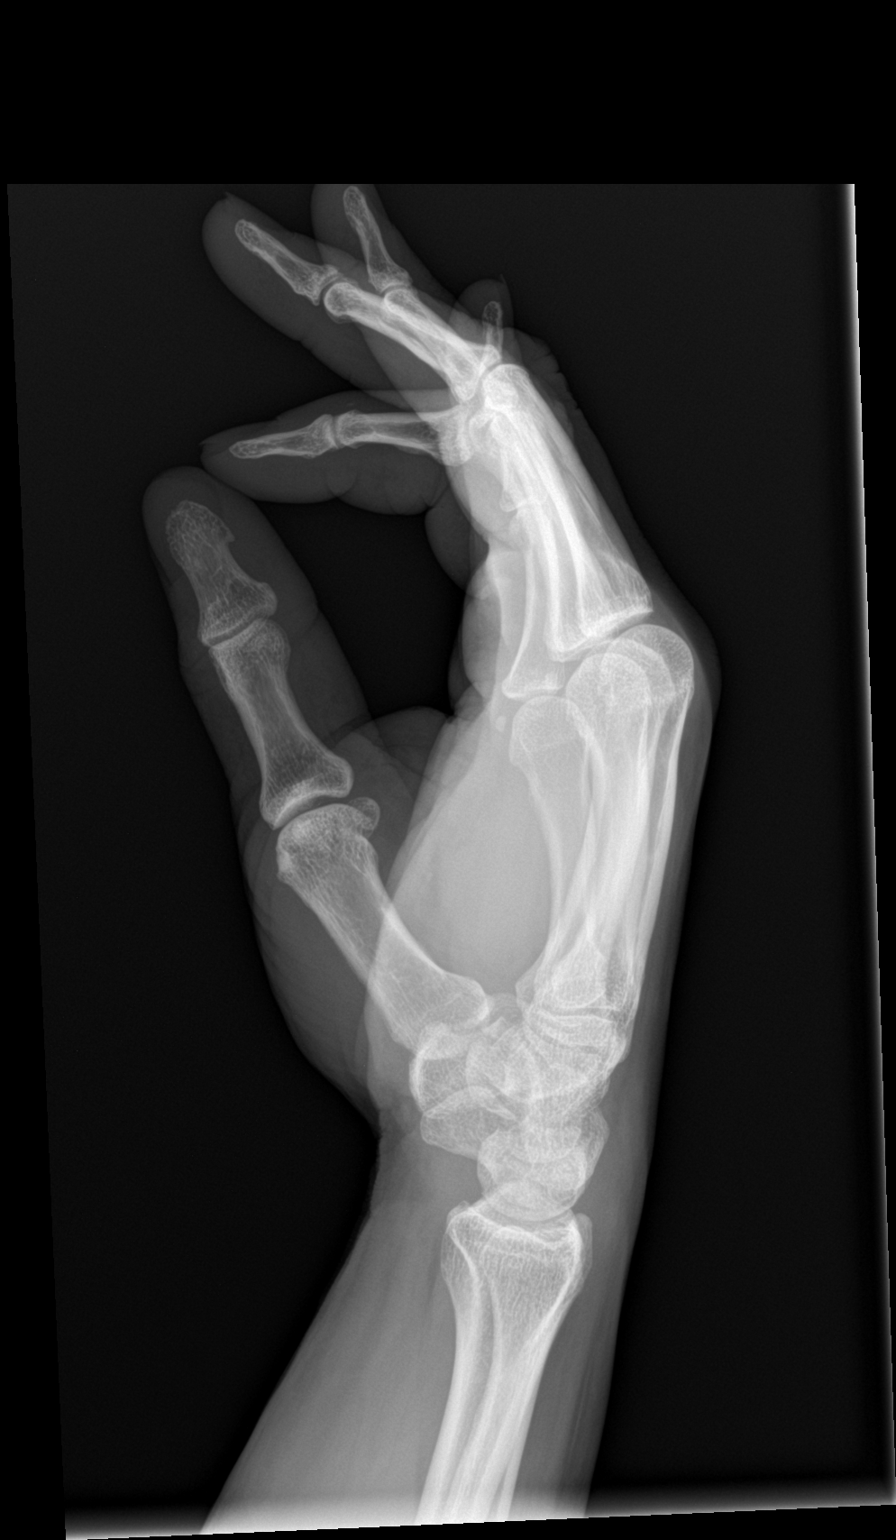

[3 of 3 positions shown; findings below may reference images not displayed]

FINDINGS: There is no evidence of fracture or dislocation. There is no
evidence of arthropathy or other focal bone abnormality. Possible
soft tissue swelling in the fingers.
IMPRESSION: No acute osseous abnormality identified.

## 2022-05-23 ENCOUNTER — Other Ambulatory Visit (HOSPITAL_COMMUNITY): Payer: Self-pay

## 2022-05-29 ENCOUNTER — Telehealth: Payer: Self-pay | Admitting: Internal Medicine

## 2022-05-29 NOTE — Telephone Encounter (Signed)
Pt c/o medication issue:  1. Name of Medication: Evolocumab (REPATHA SURECLICK) 276 MG/ML SOAJ  2. How are you currently taking this medication (dosage and times per day)? Inject 1 pen into the skin every 14 (fourteen) days.  3. Are you having a reaction (difficulty breathing--STAT)? no  4. What is your medication issue?  Starting to get cramps in her stomach, after she gave her self the shot she start bleeding (menstrual bleed like), and also got a big bruise from where she gave herself the shot. Marland Kitchen

## 2022-05-30 DIAGNOSIS — J029 Acute pharyngitis, unspecified: Secondary | ICD-10-CM | POA: Diagnosis not present

## 2022-05-30 DIAGNOSIS — D649 Anemia, unspecified: Secondary | ICD-10-CM | POA: Diagnosis not present

## 2022-05-30 DIAGNOSIS — R1084 Generalized abdominal pain: Secondary | ICD-10-CM | POA: Diagnosis not present

## 2022-05-30 DIAGNOSIS — Z03818 Encounter for observation for suspected exposure to other biological agents ruled out: Secondary | ICD-10-CM | POA: Diagnosis not present

## 2022-05-30 DIAGNOSIS — R233 Spontaneous ecchymoses: Secondary | ICD-10-CM | POA: Diagnosis not present

## 2022-05-30 NOTE — Telephone Encounter (Signed)
Spoke with patient. She has had stomach cramps for the last month and half. Not just after she given injection. Bleed for 10 min. Then she had a bruise at injection site. Will have pt hold next dose to see if cramps improve off medication. She will call us and let us know how she feels.

## 2022-05-31 ENCOUNTER — Other Ambulatory Visit: Payer: Self-pay | Admitting: Physician Assistant

## 2022-05-31 DIAGNOSIS — N950 Postmenopausal bleeding: Secondary | ICD-10-CM

## 2022-06-01 ENCOUNTER — Ambulatory Visit
Admission: RE | Admit: 2022-06-01 | Discharge: 2022-06-01 | Disposition: A | Discharge status: Home / Self Care | Payer: Medicare Other | Source: Ambulatory Visit | Attending: Physician Assistant | Admitting: Physician Assistant

## 2022-06-01 ENCOUNTER — Other Ambulatory Visit: Payer: Self-pay

## 2022-06-01 ENCOUNTER — Encounter (HOSPITAL_COMMUNITY): Payer: Self-pay

## 2022-06-01 ENCOUNTER — Emergency Department (HOSPITAL_COMMUNITY)
Admission: EM | Admit: 2022-06-01 | Discharge: 2022-06-02 | Disposition: A | Discharge status: Home / Self Care | Payer: Medicare Other | Attending: Emergency Medicine | Admitting: Emergency Medicine

## 2022-06-01 DIAGNOSIS — Z7982 Long term (current) use of aspirin: Secondary | ICD-10-CM | POA: Insufficient documentation

## 2022-06-01 DIAGNOSIS — E876 Hypokalemia: Secondary | ICD-10-CM | POA: Insufficient documentation

## 2022-06-01 DIAGNOSIS — I10 Essential (primary) hypertension: Secondary | ICD-10-CM | POA: Insufficient documentation

## 2022-06-01 DIAGNOSIS — Z91010 Allergy to peanuts: Secondary | ICD-10-CM | POA: Insufficient documentation

## 2022-06-01 DIAGNOSIS — S301XXA Contusion of abdominal wall, initial encounter: Secondary | ICD-10-CM | POA: Diagnosis not present

## 2022-06-01 DIAGNOSIS — K5730 Diverticulosis of large intestine without perforation or abscess without bleeding: Secondary | ICD-10-CM | POA: Diagnosis not present

## 2022-06-01 DIAGNOSIS — N200 Calculus of kidney: Secondary | ICD-10-CM | POA: Insufficient documentation

## 2022-06-01 DIAGNOSIS — M549 Dorsalgia, unspecified: Secondary | ICD-10-CM | POA: Diagnosis present

## 2022-06-01 DIAGNOSIS — I700 Atherosclerosis of aorta: Secondary | ICD-10-CM | POA: Insufficient documentation

## 2022-06-01 DIAGNOSIS — M5442 Lumbago with sciatica, left side: Secondary | ICD-10-CM | POA: Insufficient documentation

## 2022-06-01 DIAGNOSIS — N950 Postmenopausal bleeding: Secondary | ICD-10-CM

## 2022-06-01 DIAGNOSIS — Z91040 Latex allergy status: Secondary | ICD-10-CM | POA: Diagnosis not present

## 2022-06-01 LAB — CBC WITH DIFFERENTIAL/PLATELET
Abs Immature Granulocytes: 0.02 10*3/uL (ref 0.00–0.07)
Basophils Absolute: 0 10*3/uL (ref 0.0–0.1)
Basophils Relative: 0 %
Eosinophils Absolute: 0.2 10*3/uL (ref 0.0–0.5)
Eosinophils Relative: 3 %
HCT: 40.5 % (ref 36.0–46.0)
Hemoglobin: 12.6 g/dL (ref 12.0–15.0)
Immature Granulocytes: 0 %
Lymphocytes Relative: 38 %
Lymphs Abs: 2.9 10*3/uL (ref 0.7–4.0)
MCH: 27.8 pg (ref 26.0–34.0)
MCHC: 31.1 g/dL (ref 30.0–36.0)
MCV: 89.4 fL (ref 80.0–100.0)
Monocytes Absolute: 0.7 10*3/uL (ref 0.1–1.0)
Monocytes Relative: 9 %
Neutro Abs: 3.8 10*3/uL (ref 1.7–7.7)
Neutrophils Relative %: 50 %
Platelets: 249 10*3/uL (ref 150–400)
RBC: 4.53 MIL/uL (ref 3.87–5.11)
RDW: 12.6 % (ref 11.5–15.5)
WBC: 7.6 10*3/uL (ref 4.0–10.5)
nRBC: 0 % (ref 0.0–0.2)

## 2022-06-01 LAB — COMPREHENSIVE METABOLIC PANEL
ALT: 12 U/L (ref 0–44)
AST: 18 U/L (ref 15–41)
Albumin: 4 g/dL (ref 3.5–5.0)
Alkaline Phosphatase: 57 U/L (ref 38–126)
Anion gap: 9 (ref 5–15)
BUN: 9 mg/dL (ref 6–20)
CO2: 25 mmol/L (ref 22–32)
Calcium: 9 mg/dL (ref 8.9–10.3)
Chloride: 107 mmol/L (ref 98–111)
Creatinine, Ser: 0.74 mg/dL (ref 0.44–1.00)
GFR, Estimated: >60 mL/min (ref >60)
Glucose, Bld: 85 mg/dL (ref 70–99)
Potassium: 3.3 mmol/L — ABNORMAL LOW (ref 3.5–5.1)
Sodium: 141 mmol/L (ref 135–145)
Total Bilirubin: 0.5 mg/dL (ref 0.3–1.2)
Total Protein: 7.4 g/dL (ref 6.5–8.1)

## 2022-06-01 LAB — URINALYSIS, ROUTINE W REFLEX MICROSCOPIC
Bilirubin Urine: NEGATIVE
Glucose, UA: NEGATIVE mg/dL
Hgb urine dipstick: NEGATIVE
Ketones, ur: NEGATIVE mg/dL
Leukocytes,Ua: NEGATIVE
Nitrite: NEGATIVE
Protein, ur: NEGATIVE mg/dL
Specific Gravity, Urine: 1.024 (ref 1.005–1.030)
pH: 6 (ref 5.0–8.0)

## 2022-06-01 LAB — LIPASE, BLOOD: Lipase: 46 U/L (ref 11–51)

## 2022-06-01 MED ORDER — OXYCODONE-ACETAMINOPHEN 5-325 MG PO TABS
1.0000 | ORAL_TABLET | ORAL | Status: DC | PRN
  Administered 2022-06-01: 1 via ORAL
  Filled 2022-06-01: qty 1

## 2022-06-01 NOTE — ED Triage Notes (Signed)
Patient reports she had vaginal bleeding and went to OB had [place] and then bruise to left abd showed up.  Complains of back pain and radiating aorund to abd and down left leg.

## 2022-06-01 NOTE — ED Provider Triage Note (Signed)
Emergency Medicine Provider Triage Evaluation Note  CLAIRESSA [name] , a 56 y.o. female  was evaluated in triage.  Pt complains of generalized abdominal pain.  Has associated bruise to the left lower quadrant.  Has vaginal bleeding however notes that she is postmenopausal at this time.  Had ultrasound completed by her primary care provider today.  Notes back pain x3-4 days to the left side that radiates to the left lower quadrant into the left leg.  Notes in her back pain is worse with movement.  Denies recent injury, trauma, fall.  Was evaluated by her primary care provider 2 days ago with labs and a vaginal ultrasound ordered    Review of Systems  Positive:  Negative:   Physical Exam  BP (!) 152/80 (BP Location: Right Arm)   Pulse 81   Temp 98.6 F (37 C) (Oral)   Resp 20   LMP 07/12/2016 (Approximate) Comment: 2-3 day cycle  SpO2 97%  Gen:   Awake, no distress   Resp:  Normal effort  MSK:   Moves extremities without difficulty Other:  Diffuse abdominal tenderness to palpation.  Left lower back pain.  No spinal tenderness to palpation.  Medical Decision Making  Medically screening exam initiated at 7:04 PM.  Appropriate orders placed.  MERRIDITH DERSHEM was informed that the remainder of the evaluation will be completed by another provider, this initial triage assessment does not replace that evaluation, and the importance of remaining in the ED until their evaluation is complete.  Work-up initiated    Beryle Zeitz A, PA-C 06/01/22 1922

## 2022-06-02 ENCOUNTER — Emergency Department (HOSPITAL_COMMUNITY): Payer: Medicare Other

## 2022-06-02 ENCOUNTER — Other Ambulatory Visit (HOSPITAL_COMMUNITY): Payer: Self-pay

## 2022-06-02 DIAGNOSIS — K5730 Diverticulosis of large intestine without perforation or abscess without bleeding: Secondary | ICD-10-CM | POA: Diagnosis not present

## 2022-06-02 DIAGNOSIS — N200 Calculus of kidney: Secondary | ICD-10-CM | POA: Diagnosis not present

## 2022-06-02 DIAGNOSIS — S301XXA Contusion of abdominal wall, initial encounter: Secondary | ICD-10-CM | POA: Diagnosis not present

## 2022-06-02 DIAGNOSIS — I700 Atherosclerosis of aorta: Secondary | ICD-10-CM | POA: Diagnosis not present

## 2022-06-02 MED ORDER — POTASSIUM CHLORIDE CRYS ER 20 MEQ PO TBCR
20.0000 meq | EXTENDED_RELEASE_TABLET | Freq: Every day | ORAL | 0 refills | Status: DC
  Filled 2022-06-02: qty 20, 20d supply, fill #0

## 2022-06-02 MED ORDER — NAPROXEN 500 MG PO TABS
500.0000 mg | ORAL_TABLET | Freq: Two times a day (BID) | ORAL | 0 refills | Status: DC
  Filled 2022-06-02: qty 30, 15d supply, fill #0

## 2022-06-02 MED ORDER — CYCLOBENZAPRINE HCL 10 MG PO TABS
10.0000 mg | ORAL_TABLET | Freq: Once | ORAL | Status: AC
  Administered 2022-06-02: 10 mg via ORAL
  Filled 2022-06-02: qty 1

## 2022-06-02 MED ORDER — TIZANIDINE HCL 4 MG PO TABS
4.0000 mg | ORAL_TABLET | Freq: Four times a day (QID) | ORAL | 0 refills | Status: AC | PRN
  Filled 2022-06-02: qty 60, 15d supply, fill #0

## 2022-06-02 MED ORDER — OXYCODONE HCL 5 MG PO TABS
5.0000 mg | ORAL_TABLET | ORAL | 0 refills | Status: AC | PRN
  Filled 2022-06-02: qty 30, 5d supply, fill #0

## 2022-06-02 MED ORDER — OXYCODONE-ACETAMINOPHEN 5-325 MG PO TABS
1.0000 | ORAL_TABLET | Freq: Once | ORAL | Status: AC
  Administered 2022-06-02: 1 via ORAL
  Filled 2022-06-02: qty 1

## 2022-06-02 MED ORDER — IBUPROFEN 400 MG PO TABS
400.0000 mg | ORAL_TABLET | Freq: Once | ORAL | Status: AC
  Administered 2022-06-02: 400 mg via ORAL
  Filled 2022-06-02: qty 1

## 2022-06-02 MED ORDER — PREDNISONE 20 MG PO TABS
60.0000 mg | ORAL_TABLET | Freq: Once | ORAL | Status: AC
  Administered 2022-06-02: 60 mg via ORAL
  Filled 2022-06-02: qty 3

## 2022-06-02 MED ORDER — PREDNISONE 50 MG PO TABS
50.0000 mg | ORAL_TABLET | Freq: Every day | ORAL | 0 refills | Status: DC
  Filled 2022-06-02: qty 5, 5d supply, fill #0

## 2022-06-02 NOTE — Discharge Instructions (Addendum)
Apply ice to the areas where you are having pain.  Ice can be applied for 30 minutes at a time, 4 times a day.  In addition to the prescribed medication, you may take over-the-counter acetaminophen for pain.  Acetaminophen and naproxen work on pain in different ways and work very well together.  Reserve oxycodone tablets for pain that is not being adequately controlled with naproxen plus acetaminophen.  Return to the emergency department if pain is not being adequately controlled, or if you have any new or concerning symptoms.

## 2022-06-02 NOTE — ED Notes (Signed)
Pt transported to CT ?

## 2022-06-02 NOTE — ED Notes (Signed)
Patient verbalizes understanding of discharge instructions. Opportunity for questioning and answers were provided. Armband removed by staff, pt discharged from ED via wheelchair.  

## 2022-06-02 NOTE — ED Provider Notes (Signed)
[facility] EMERGENCY DEPARTMENT Provider Note   CSN: [id number] Arrival date & time: 06/01/22  1749     History  Chief Complaint  Patient presents with   Back Pain    Wendy [name] is a 56 y.o. female.  The history is provided by the patient.  Back Pain She has history of hypertension, hyperlipidemia and comes in because of back and abdominal pain which have been present for the last several weeks but very getting worse.  Back pain is primarily on the left side, but pain does radiate throughout her abdomen.  Pain actually seems to move between her abdomen and back.  Pain also radiates down her left leg.  She has been taking acetaminophen without relief.  She denies any weakness or numbness or tingling and denies any bowel or bladder dysfunction.  She did have an episode of vaginal bleeding and saw her primary care provider who ordered a pelvic ultrasound which was done earlier today.  She has had back problems in the past, but not this severe.  She did get a dose of oxycodone-acetaminophen in the emergency department which has given her partial relief.  She also noted a spontaneous bruise on the left side of her abdomen but no other spontaneous bruising.   Home Medications Prior to Admission medications   Medication Sig Start Date End Date Taking? Authorizing Provider  acetaminophen (TYLENOL) 325 MG tablet Take 2 tablets (650 mg total) by mouth every 6 (six) hours as needed. 10/01/20   Mesner, Corene Cornea, MD  albuterol (PROVENTIL HFA;VENTOLIN HFA) 108 (90 Base) MCG/ACT inhaler Inhale 2 puffs into the lungs every 4 (four) hours as needed for wheezing or shortness of breath (cough). 11/20/16   Street, [place], PA-C  aspirin EC 81 MG EC tablet Take 1 tablet (81 mg total) by mouth daily. Swallow whole. 07/17/20   Bhagat, Crista Luria, PA  beclomethasone (QVAR) 80 MCG/ACT inhaler Inhale 1 puff into the lungs 2 (two) times daily as needed (sob/wheezing).     [provider]  budesonide-formoterol (SYMBICORT) 80-4.5 MCG/ACT inhaler Inhale 2 puffs into the lungs daily. 02/15/22     cyclobenzaprine (FLEXERIL) 10 MG tablet Take 1 tablet (10 mg total) by mouth 2 (two) times daily as needed for muscle spasms. 10/01/20   Mesner, Corene Cornea, MD  diltiazem (CARTIA XT) 120 MG 24 hr capsule Take 1 capsule (120 mg total) by mouth daily. 02/01/22   Chandrasekhar, Lyda Kalata A, MD  EPINEPHrine (EPIPEN JR) 0.15 MG/0.3ML injection Inject 0.15 mg into the muscle daily as needed for anaphylaxis.     [provider]  ergocalciferol (DRISDOL) 200 MCG/ML drops Take by mouth daily.    [provider]  Evolocumab (REPATHA SURECLICK) 237 MG/ML SOAJ Inject 1 pen into the skin every 14 (fourteen) days. 11/29/21   Werner Lean, MD  fluticasone (FLOVENT HFA) 44 MCG/ACT inhaler as needed. 02/03/21   [provider]  furosemide (LASIX) 20 MG tablet Take 0.5 tablets (10 mg total) by mouth daily as needed for fluid retention or swelling 12/06/21   Chandrasekhar, Mahesh A, MD  gabapentin (NEURONTIN) 600 MG tablet Take 600 mg by mouth at bedtime.    [provider]  hydrocortisone (ANUSOL-HC) 2.5 % rectal cream Apply 1 application topically 2 (two) times daily for hemorrhoids for 14 days. 12/09/21     hydrocortisone (ANUSOL-HC) 2.5 % rectal cream as needed. hemorrhoids 12/09/21   [provider]  hydrOXYzine (ATARAX/VISTARIL) 25 MG tablet Take 25 mg by mouth  3 (three) times daily as needed for anxiety or itching.  10/09/19   [provider]  isosorbide mononitrate (IMDUR) 30 MG 24 hr tablet Take 1 tablet (30 mg total) by mouth daily. 12/15/21   Chandrasekhar, Mahesh A, MD  ketoconazole (NIZORAL) 2 % cream Apply 1 application topically 2 (two) times daily for 14 days. 12/09/21     lidocaine (XYLOCAINE) 5 % ointment Apply 1 application topically as needed. 09/02/21   Sherrell Puller, PA-C  nitroGLYCERIN (NITROSTAT) 0.4 MG SL tablet PLACE 1 TABLET (0.4 MG TOTAL)  UNDER THE TONGUE EVERY 5 (FIVE) MINUTES FOR 3 DOSES AS NEEDED FOR CHEST PAIN. 08/11/21   Chandrasekhar, Mahesh A, MD  pantoprazole (PROTONIX) 20 MG tablet Take 1 tablet (20 mg total) by mouth daily. 02/15/22     polyethylene glycol (MIRALAX / GLYCOLAX) 17 g packet Take 17 g by mouth every other day.    [provider]  potassium chloride (KLOR-CON) 10 MEQ tablet Take 1 tablet (10 mEq total) by mouth daily for 5 days. 04/14/22 04/19/22  Elgie Congo, MD  potassium chloride SA (KLOR-CON) 20 MEQ tablet Take 20 mEq by mouth daily.    [provider]  promethazine-dextromethorphan (PROMETHAZINE-DM) 6.25-15 MG/5ML syrup Take 5 mLs by mouth every 6 (six) hours if needed 02/08/22     rosuvastatin (CRESTOR) 40 MG tablet Take 1 tablet (40 mg total) by mouth daily. 02/28/22   Werner Lean, MD  senna (SENOKOT) 8.6 MG TABS tablet Take 1 tablet by mouth as needed.    [provider]  valACYclovir (VALTREX) 1000 MG tablet as needed.    [provider]      Allergies    Pfizer-biontech covid-19 vacc [covid-19 mrna vacc (moderna)], Chocolate, Ivp dye [iodinated contrast media], Latex, Shellfish allergy, Fish-derived products, Peanut-containing drug products, and Soap    Review of Systems   Review of Systems  Musculoskeletal:  Positive for back pain.  All other systems reviewed and are negative.   Physical Exam Updated Vital Signs BP 124/64   Pulse 67   Temp 97.9 F (36.6 C) (Oral)   Resp 15   Ht '4\' 11"'$  (1.499 m)   Wt 93.9 kg   LMP 07/12/2016 (Approximate) Comment: 2-3 day cycle  SpO2 100%   BMI 41.81 kg/m  Physical Exam Vitals and nursing note reviewed.   56 year old female, resting comfortably and in no acute distress. Vital signs are normal. Oxygen saturation is 100%, which is normal. Head is normocephalic and atraumatic. PERRLA, EOMI. Oropharynx is clear. Neck is nontender and supple without adenopathy or JVD. Back is mildly tender throughout the  lumbar spine with moderate left CVA tenderness.  Straight leg raise is negative on the right, positive at 15 degrees on the left. Lungs are clear without rales, wheezes, or rhonchi. Chest is nontender. Heart has regular rate and rhythm without murmur. Abdomen is soft, flat, with mild tenderness diffusely.  There is no rebound or guarding ecchymosis is noted in the left upper quadrant. Extremities have no cyanosis or edema, full range of motion is present. Skin is warm and dry without rash. Neurologic: Mental status is normal, cranial nerves are intact, strength is 5/5 in all 4 extremities, sensory exam is normal.  ED Results / Procedures / Treatments   Labs (all labs ordered are listed, but only abnormal results are displayed) Labs Reviewed  COMPREHENSIVE METABOLIC PANEL - Abnormal; Notable for the following components:      Result Value   Potassium  3.3 (*)    All other components within normal limits  CBC WITH DIFFERENTIAL/PLATELET  LIPASE, BLOOD  URINALYSIS, ROUTINE W REFLEX MICROSCOPIC   Radiology CT Renal Stone Study  Result Date: 06/02/2022 CLINICAL DATA:  Left lower quadrant bruise and left-sided pain that radiates to the left lower quadrant and left leg. EXAM: CT ABDOMEN AND PELVIS WITHOUT CONTRAST TECHNIQUE: Multidetector CT imaging of the abdomen and pelvis was performed following the standard protocol without IV contrast. RADIATION DOSE REDUCTION: This exam was performed according to the departmental dose-optimization program which includes automated exposure control, adjustment of the mA and/or kV according to patient size and/or use of iterative reconstruction technique. COMPARISON:  December 18, 2020 FINDINGS: Lower chest: No acute abnormality. Hepatobiliary: No focal liver abnormality is seen. No gallstones, gallbladder wall thickening, or biliary dilatation. Pancreas: Unremarkable. No pancreatic ductal dilatation or surrounding inflammatory changes. Spleen: Normal in size without  focal abnormality. Adrenals/Urinary Tract: Adrenal glands are unremarkable. Kidneys are normal, without obstructing renal calculi, focal lesion, or hydronephrosis. A 1 mm nonobstructing renal calculus is seen within the lower pole of the left kidney. Bladder is unremarkable. Stomach/Bowel: Stomach is within normal limits. Appendix appears normal. No evidence of bowel wall thickening, distention, or inflammatory changes. Small, noninflamed diverticula are seen within the sigmoid colon. Vascular/Lymphatic: Aortic atherosclerosis. No enlarged abdominal or pelvic lymph nodes. Reproductive: Uterus and bilateral adnexa are unremarkable. Other: No abdominal wall hernia or abnormality. No abdominopelvic ascites. Musculoskeletal: No acute or significant osseous findings. IMPRESSION: 1. 1 mm nonobstructing left renal calculus. 2. Sigmoid diverticulosis. 3. Aortic atherosclerosis. Aortic Atherosclerosis (ICD10-I70.0). Electronically Signed   By: Virgina Norfolk M.D.   On: 06/02/2022 01:47    Procedures Procedures    Medications Ordered in ED Medications  oxyCODONE-acetaminophen (PERCOCET/ROXICET) 5-325 MG per tablet 1 tablet (1 tablet Oral Given 06/01/22 2032)  oxyCODONE-acetaminophen (PERCOCET/ROXICET) 5-325 MG per tablet 1 tablet (has no administration in time range)  ibuprofen (ADVIL) tablet 400 mg (has no administration in time range)  cyclobenzaprine (FLEXERIL) tablet 10 mg (has no administration in time range)  predniSONE (DELTASONE) tablet 60 mg (has no administration in time range)    ED Course/ Medical Decision Making/ A&P                           Medical Decision Making Amount and/or Complexity of Data Reviewed Radiology: ordered.  Risk Prescription drug management.   Low back pain radiating down the left leg which seems to be sciatica.  However, because of abdominal pain is not clear.  Differential diagnosis does include diverticulitis, abdominal aortic aneurysm, pyelonephritis,  urolithiasis.  I have reviewed and interpreted the laboratory tests and my interpretation is mild hypokalemia but otherwise normal CBC and comprehensive metabolic panel, normal urinalysis.  Old records are reviewed, and she did have an MRI of her lumbar spine on 05/17/2007 which did show some mild disc degeneration at L4-5 with a bulge, mild facet degeneration at L4-5 and L5-S1.  Pelvic ultrasound was done earlier today, but reading is not available yet.  I have ordered renal stone protocol CT scan to rule out significant intra-abdominal pathology.  CT scan shows nonobstructing left renal calculus, aortic atherosclerosis without aneurysm.  I have independently viewed the images, and agree with the radiologist's interpretation.  I have ordered ibuprofen, oxycodone-acetaminophen, cyclobenzaprine, and prednisone.  I am sending patient home with prescriptions for a 5-day course of prednisone, prescriptions for naproxen and tizanidine as oral potassium and oxycodone  for pure pain control.  She is referred back to neurosurgery for further outpatient work-up and management.  Final Clinical Impression(s) / ED Diagnoses Final diagnoses:  Acute right-sided low back pain with left-sided sciatica  Hypokalemia    Rx / DC Orders ED Discharge Orders          Ordered    naproxen (NAPROSYN) 500 MG tablet  2 times daily        06/02/22 0429    tiZANidine (ZANAFLEX) 4 MG tablet  Every 6 hours PRN        06/02/22 0429    predniSONE (DELTASONE) 50 MG tablet  Daily        06/02/22 0429    oxyCODONE (ROXICODONE) 5 MG immediate release tablet  Every 4 hours PRN        06/02/22 0429    potassium chloride 20 MEQ TBCR  Daily        06/02/22 6950              Delora Fuel, MD 72/25/75 [phone]

## 2022-06-21 ENCOUNTER — Other Ambulatory Visit (HOSPITAL_COMMUNITY): Payer: Self-pay

## 2022-06-21 ENCOUNTER — Other Ambulatory Visit: Payer: Self-pay | Admitting: Internal Medicine

## 2022-06-22 ENCOUNTER — Other Ambulatory Visit (HOSPITAL_COMMUNITY): Payer: Self-pay

## 2022-06-22 MED ORDER — NITROGLYCERIN 0.4 MG SL SUBL
SUBLINGUAL_TABLET | SUBLINGUAL | 8 refills | Status: DC
  Filled 2022-06-22: qty 25, 7d supply, fill #0
  Filled 2022-10-16: qty 25, 7d supply, fill #1
  Filled 2023-02-02: qty 25, 7d supply, fill #2
  Filled 2023-06-11: qty 25, 7d supply, fill #3

## 2022-06-27 ENCOUNTER — Other Ambulatory Visit (HOSPITAL_COMMUNITY): Payer: Self-pay

## 2022-07-04 DIAGNOSIS — R21 Rash and other nonspecific skin eruption: Secondary | ICD-10-CM | POA: Diagnosis not present

## 2022-07-04 DIAGNOSIS — R609 Edema, unspecified: Secondary | ICD-10-CM | POA: Diagnosis not present

## 2022-07-07 DIAGNOSIS — R0789 Other chest pain: Secondary | ICD-10-CM | POA: Diagnosis not present

## 2022-07-07 DIAGNOSIS — R42 Dizziness and giddiness: Secondary | ICD-10-CM | POA: Diagnosis not present

## 2022-07-07 DIAGNOSIS — I10 Essential (primary) hypertension: Secondary | ICD-10-CM | POA: Diagnosis not present

## 2022-07-07 DIAGNOSIS — R079 Chest pain, unspecified: Secondary | ICD-10-CM | POA: Diagnosis not present

## 2022-07-10 ENCOUNTER — Encounter (HOSPITAL_BASED_OUTPATIENT_CLINIC_OR_DEPARTMENT_OTHER): Payer: Self-pay

## 2022-07-10 ENCOUNTER — Other Ambulatory Visit (HOSPITAL_COMMUNITY): Payer: Self-pay

## 2022-07-10 ENCOUNTER — Other Ambulatory Visit: Payer: Self-pay

## 2022-07-10 DIAGNOSIS — I10 Essential (primary) hypertension: Secondary | ICD-10-CM | POA: Diagnosis not present

## 2022-07-10 DIAGNOSIS — L209 Atopic dermatitis, unspecified: Secondary | ICD-10-CM | POA: Diagnosis not present

## 2022-07-10 DIAGNOSIS — L243 Irritant contact dermatitis due to cosmetics: Secondary | ICD-10-CM | POA: Insufficient documentation

## 2022-07-10 DIAGNOSIS — I700 Atherosclerosis of aorta: Secondary | ICD-10-CM | POA: Diagnosis not present

## 2022-07-10 DIAGNOSIS — I252 Old myocardial infarction: Secondary | ICD-10-CM | POA: Diagnosis not present

## 2022-07-10 DIAGNOSIS — I25119 Atherosclerotic heart disease of native coronary artery with unspecified angina pectoris: Secondary | ICD-10-CM | POA: Diagnosis not present

## 2022-07-10 DIAGNOSIS — Z91010 Allergy to peanuts: Secondary | ICD-10-CM | POA: Diagnosis not present

## 2022-07-10 DIAGNOSIS — Z7982 Long term (current) use of aspirin: Secondary | ICD-10-CM | POA: Insufficient documentation

## 2022-07-10 DIAGNOSIS — Z91040 Latex allergy status: Secondary | ICD-10-CM | POA: Diagnosis not present

## 2022-07-10 DIAGNOSIS — T7840XA Allergy, unspecified, initial encounter: Secondary | ICD-10-CM | POA: Diagnosis present

## 2022-07-10 DIAGNOSIS — L232 Allergic contact dermatitis due to cosmetics: Secondary | ICD-10-CM | POA: Diagnosis not present

## 2022-07-10 DIAGNOSIS — T300 Burn of unspecified body region, unspecified degree: Secondary | ICD-10-CM | POA: Diagnosis not present

## 2022-07-10 NOTE — ED Triage Notes (Signed)
Pt states that she put hair dye in her hair on Saturday. Now is breaking out on face and around hairline. Pt also reports that her hair is falling out more than usual. No SHOB. No tongue/throat swelling.

## 2022-07-11 ENCOUNTER — Emergency Department (HOSPITAL_BASED_OUTPATIENT_CLINIC_OR_DEPARTMENT_OTHER)
Admission: EM | Admit: 2022-07-11 | Discharge: 2022-07-11 | Disposition: A | Discharge status: Home / Self Care | Payer: Medicare Other | Attending: Emergency Medicine | Admitting: Emergency Medicine

## 2022-07-11 DIAGNOSIS — L243 Irritant contact dermatitis due to cosmetics: Secondary | ICD-10-CM

## 2022-07-11 NOTE — ED Provider Notes (Signed)
[facility] EMERGENCY DEPT  Provider Note  CSN: [id number] Arrival date & time: 07/10/22 2120  History Chief Complaint  Patient presents with   Allergic Reaction    Wendy [name] is a 56 y.o. female presents for evaluation of allergic reaction to her scalp. She applied a hair dye to her hairline 2 days prior to arrival and broke out in an itchy rash the next day. She saw her doctor earlier today and was given an Rx for pred-pak but did not have any improvement several hours later and the after-hours number advised her to come to the ED due to concerns of airway involvement. She denies any trouble breathing, throat or tongue swelling. She was told not to take benadryl for some reason.    Home Medications Prior to Admission medications   Medication Sig Start Date End Date Taking? Authorizing Provider  acetaminophen (TYLENOL) 325 MG tablet Take 2 tablets (650 mg total) by mouth every 6 (six) hours as needed. 10/01/20   Mesner, Corene Cornea, MD  albuterol (PROVENTIL HFA;VENTOLIN HFA) 108 (90 Base) MCG/ACT inhaler Inhale 2 puffs into the lungs every 4 (four) hours as needed for wheezing or shortness of breath (cough). 11/20/16   Street, [place], PA-C  aspirin EC 81 MG EC tablet Take 1 tablet (81 mg total) by mouth daily. Swallow whole. 07/17/20   Bhagat, Crista Luria, PA  beclomethasone (QVAR) 80 MCG/ACT inhaler Inhale 1 puff into the lungs 2 (two) times daily as needed (sob/wheezing).     [provider]  budesonide-formoterol (SYMBICORT) 80-4.5 MCG/ACT inhaler Inhale 2 puffs into the lungs daily. 02/15/22     cyclobenzaprine (FLEXERIL) 10 MG tablet Take 1 tablet (10 mg total) by mouth 2 (two) times daily as needed for muscle spasms. 10/01/20   Mesner, Corene Cornea, MD  diltiazem (CARTIA XT) 120 MG 24 hr capsule Take 1 capsule (120 mg total) by mouth daily. 02/01/22   Chandrasekhar, Lyda Kalata A, MD  EPINEPHrine (EPIPEN JR) 0.15 MG/0.3ML injection Inject 0.15 mg into the muscle daily as  needed for anaphylaxis.     [provider]  ergocalciferol (DRISDOL) 200 MCG/ML drops Take by mouth daily.    [provider]  Evolocumab (REPATHA SURECLICK) 629 MG/ML SOAJ Inject 1 pen into the skin every 14 (fourteen) days. 11/29/21   Werner Lean, MD  fluticasone (FLOVENT HFA) 44 MCG/ACT inhaler as needed. 02/03/21   [provider]  furosemide (LASIX) 20 MG tablet Take 0.5 tablets (10 mg total) by mouth daily as needed for fluid retention or swelling 12/06/21   Chandrasekhar, Mahesh A, MD  gabapentin (NEURONTIN) 600 MG tablet Take 600 mg by mouth at bedtime.    [provider]  hydrocortisone (ANUSOL-HC) 2.5 % rectal cream Apply 1 application topically 2 (two) times daily for hemorrhoids for 14 days. 12/09/21     hydrocortisone (ANUSOL-HC) 2.5 % rectal cream as needed. hemorrhoids 12/09/21   [provider]  hydrOXYzine (ATARAX/VISTARIL) 25 MG tablet Take 25 mg by mouth 3 (three) times daily as needed for anxiety or itching.  10/09/19   [provider]  isosorbide mononitrate (IMDUR) 30 MG 24 hr tablet Take 1 tablet (30 mg total) by mouth daily. 12/15/21   Chandrasekhar, Mahesh A, MD  ketoconazole (NIZORAL) 2 % cream Apply 1 application topically 2 (two) times daily for 14 days. 12/09/21     lidocaine (XYLOCAINE) 5 % ointment Apply 1 application topically as needed. 09/02/21   Sherrell Puller, PA-C  naproxen (NAPROSYN) 500 MG tablet Take  1 tablet (500 mg total) by mouth 2 (two) times daily. 2/67/12   Delora Fuel, MD  nitroGLYCERIN (NITROSTAT) 0.4 MG SL tablet PLACE 1 TABLET (0.4 MG TOTAL) UNDER THE TONGUE EVERY 5 (FIVE) MINUTES FOR 3 DOSES AS NEEDED FOR CHEST PAIN. 06/22/22   Chandrasekhar, Mahesh A, MD  oxyCODONE (ROXICODONE) 5 MG immediate release tablet Take 1 tablet (5 mg total) by mouth every 4 (four) hours as needed for severe pain. 4/58/09   Delora Fuel, MD  pantoprazole (PROTONIX) 20 MG tablet Take 1 tablet (20 mg total) by mouth  daily. 02/15/22     polyethylene glycol (MIRALAX / GLYCOLAX) 17 g packet Take 17 g by mouth every other day.    [provider]  potassium chloride SA (KLOR-CON M) 20 MEQ tablet Take 1 tablet (20 mEq total) by mouth daily. 9/83/38   Delora Fuel, MD  potassium chloride SA (KLOR-CON) 20 MEQ tablet Take 20 mEq by mouth daily.    [provider]  predniSONE (DELTASONE) 50 MG tablet Take 1 tablet (50 mg total) by mouth daily. 2/50/53   Delora Fuel, MD  promethazine-dextromethorphan (PROMETHAZINE-DM) 6.25-15 MG/5ML syrup Take 5 mLs by mouth every 6 (six) hours if needed 02/08/22     rosuvastatin (CRESTOR) 40 MG tablet Take 1 tablet (40 mg total) by mouth daily. 02/28/22   Werner Lean, MD  senna (SENOKOT) 8.6 MG TABS tablet Take 1 tablet by mouth as needed.    [provider]  tiZANidine (ZANAFLEX) 4 MG tablet Take 1 tablet (4 mg total) by mouth every 6 (six) hours as needed for muscle spasms. 9/76/73   Delora Fuel, MD  valACYclovir (VALTREX) 1000 MG tablet as needed.    [provider]  potassium chloride (KLOR-CON) 10 MEQ tablet Take 1 tablet (10 mEq total) by mouth daily for 5 days. 04/14/22 06/02/22  Elgie Congo, MD     Allergies    Pfizer-biontech Debbe Ode[place] vacc Debbe Ode[place] mrna vacc (moderna)], Chocolate, Ivp dye [iodinated contrast media], Latex, Shellfish allergy, Fish-derived products, Peanut-containing drug products, and Soap   Review of Systems   Review of Systems Please see HPI for pertinent positives and negatives  Physical Exam BP (!) 181/84   Pulse (!) 109   Temp 98.1 F (36.7 C)   Resp (!) 22   Ht '4\' 11"'$  (1.499 m)   Wt 92.5 kg   LMP 07/12/2016 (Approximate) Comment: 2-3 day cycle  SpO2 100%   BMI 41.20 kg/m   Physical Exam Vitals and nursing note reviewed.  HENT:     Head: Normocephalic.     Comments: Contact dermatitis reaction to forehead along the hairline    Nose: Nose normal.     Mouth/Throat:     Mouth: Mucous  membranes are moist.     Comments: No angioedema Eyes:     Extraocular Movements: Extraocular movements intact.  Pulmonary:     Effort: Pulmonary effort is normal.     Breath sounds: No stridor.  Musculoskeletal:        General: Normal range of motion.     Cervical back: Neck supple.  Skin:    Findings: No rash (on exposed skin).  Neurological:     Mental Status: She is alert and oriented to person, place, and time.  Psychiatric:        Mood and Affect: Mood normal.     ED Results / Procedures / Treatments   EKG None  Procedures Procedures  Medications Ordered in the ED Medications -  No data to display  Initial Impression and Plan  Patient with localized contact dermatitis. Advised to continue prednisone as prescribed and that it may take several days to improve. She can continue benadryl for itching and motrin/APAP for discomfort. No other ED interventions indicated.   ED Course       MDM Rules/Calculators/A&P Medical Decision Making Problems Addressed: Irritant contact dermatitis due to cosmetics: acute illness or injury  Risk OTC drugs. Prescription drug management.    Final Clinical Impression(s) / ED Diagnoses Final diagnoses:  Irritant contact dermatitis due to cosmetics    Rx / DC Orders ED Discharge Orders     None        Truddie Hidden, MD 07/11/22 [phone]

## 2022-07-12 ENCOUNTER — Emergency Department (HOSPITAL_COMMUNITY)
Admission: EM | Admit: 2022-07-12 | Discharge: 2022-07-12 | Disposition: A | Discharge status: Home / Self Care | Payer: Medicare Other | Attending: Emergency Medicine | Admitting: Emergency Medicine

## 2022-07-12 ENCOUNTER — Encounter (HOSPITAL_COMMUNITY): Payer: Self-pay | Admitting: Emergency Medicine

## 2022-07-12 ENCOUNTER — Other Ambulatory Visit (HOSPITAL_COMMUNITY): Payer: Self-pay

## 2022-07-12 DIAGNOSIS — Z91040 Latex allergy status: Secondary | ICD-10-CM | POA: Diagnosis not present

## 2022-07-12 DIAGNOSIS — I2510 Atherosclerotic heart disease of native coronary artery without angina pectoris: Secondary | ICD-10-CM | POA: Diagnosis not present

## 2022-07-12 DIAGNOSIS — R0602 Shortness of breath: Secondary | ICD-10-CM | POA: Insufficient documentation

## 2022-07-12 DIAGNOSIS — L249 Irritant contact dermatitis, unspecified cause: Secondary | ICD-10-CM | POA: Diagnosis not present

## 2022-07-12 DIAGNOSIS — R220 Localized swelling, mass and lump, head: Secondary | ICD-10-CM | POA: Diagnosis not present

## 2022-07-12 DIAGNOSIS — Z91010 Allergy to peanuts: Secondary | ICD-10-CM | POA: Insufficient documentation

## 2022-07-12 DIAGNOSIS — L259 Unspecified contact dermatitis, unspecified cause: Secondary | ICD-10-CM | POA: Diagnosis not present

## 2022-07-12 DIAGNOSIS — Z7902 Long term (current) use of antithrombotics/antiplatelets: Secondary | ICD-10-CM | POA: Diagnosis not present

## 2022-07-12 DIAGNOSIS — R0600 Dyspnea, unspecified: Secondary | ICD-10-CM | POA: Diagnosis not present

## 2022-07-12 DIAGNOSIS — R609 Edema, unspecified: Secondary | ICD-10-CM | POA: Diagnosis not present

## 2022-07-12 MED ORDER — FAMOTIDINE 20 MG PO TABS
20.0000 mg | ORAL_TABLET | Freq: Once | ORAL | Status: AC
Start: 2022-07-12 — End: 2022-07-12
  Administered 2022-07-12: 20 mg via ORAL
  Filled 2022-07-12: qty 1

## 2022-07-12 MED ORDER — DEXAMETHASONE SODIUM PHOSPHATE 10 MG/ML IJ SOLN
10.0000 mg | Freq: Once | INTRAMUSCULAR | Status: AC
  Administered 2022-07-12: 10 mg via INTRAMUSCULAR

## 2022-07-12 MED ORDER — DIPHENHYDRAMINE HCL 25 MG PO CAPS
50.0000 mg | ORAL_CAPSULE | Freq: Once | ORAL | Status: AC
  Administered 2022-07-12: 50 mg via ORAL
  Filled 2022-07-12: qty 2

## 2022-07-12 MED ORDER — TRIAMCINOLONE ACETONIDE 0.1 % EX CREA
1.0000 | TOPICAL_CREAM | Freq: Two times a day (BID) | CUTANEOUS | 0 refills | Status: AC
  Filled 2022-07-12: qty 30, 30d supply, fill #0

## 2022-07-12 MED ORDER — DEXAMETHASONE SODIUM PHOSPHATE 10 MG/ML IJ SOLN
10.0000 mg | Freq: Once | INTRAMUSCULAR | Status: DC
  Filled 2022-07-12: qty 1

## 2022-07-12 NOTE — ED Provider Triage Note (Signed)
Emergency Medicine Provider Triage Evaluation Note  Wendy [name] , a 56 y.o. female  was evaluated in triage.  Pt complains of facial swelling and rash on scalp.  Patient states the symptoms been present for approximately past 3 days after continued diarrhea here.Marland Kitchen  She was seen by provider 2 days ago and discharged with prednisone.  She states that she has been taking prednisone as prescribed as well as taking Benadryl at home.  She states that since then, has noticed increasing pruritic/burning of rash, facial swelling and some difficulty breathing.  She is unaware of if difficulty breathing is secondary to high anxiety state or if it is due to subsequent swelling.  Denies history of anaphylactic type reaction.  Has EpiPen but does not use it.  Has been putting Vaseline, baking soda paste, hydrocortisone cream on scalp.  Review of Systems  Positive: See above Negative:   Physical Exam  BP 136/76 (BP Location: Right Arm)   Pulse 87   Resp (!) 22   LMP 07/12/2016 (Approximate) Comment: 2-3 day cycle  SpO2 100%  Gen:   Awake, no distress   Resp:  Normal effort  MSK:   Moves extremities without difficulty  Other:  Possible right-sided facial swelling.  No obvious stridor noted.  No obvious respiratory distress.  Patient tolerating oral secretions without difficulty.  Lungs clear to auscultation.  Medical Decision Making  Medically screening exam initiated at 11:11 AM.  Appropriate orders placed.  Wendy [name] was informed that the remainder of the evaluation will be completed by another provider, this initial triage assessment does not replace that evaluation, and the importance of remaining in the ED until their evaluation is complete.     Wilnette Kales, [place] 07/12/22 1113

## 2022-07-12 NOTE — ED Triage Notes (Signed)
Patient BIB [facility] from home after her face started swelling after using a new hair dye on Saturday. Patient states she seen and started on prednisone three days ago but patient states she feels like the swelling is getting worse. Patient is alert, oriented, speaking in complete sentences, and is anxious at this time.  BP 148/88 HR 88 99% on room air

## 2022-07-12 NOTE — ED Provider Notes (Signed)
[facility] EMERGENCY DEPARTMENT Provider Note   CSN: [id number] Arrival date & time: 07/12/22  1102     History  Chief Complaint  Patient presents with   Facial Swelling    Wendy [name] is a 56 y.o. female.  HPI   56 year old female history of coronary artery disease presents today complaining of ongoing facial swelling with some dyspnea.  She had an allergic reaction to a new hair dye that occurred on Saturday.  She states the symptoms began after the Endya was initially rinsed off.  That consisted of having swelling and itching at the site.  She has shampooed the area several times since then.  She was seen by primary care doctor and started on a prednisone pack.  She reports taking the first several days and took 40 mg today.  She continued to have some itching and swelling and was seen at droppage yesterday.  She reports that she felt like she had some shortness of breath earlier.  She feels that she is having some swelling of the face.  Home Medications Prior to Admission medications   Medication Sig Start Date End Date Taking? Authorizing Provider  triamcinolone cream (KENALOG) 0.1 % Apply 1 Application topically 2 (two) times daily. 07/12/22  Yes Pattricia Boss, MD  acetaminophen (TYLENOL) 325 MG tablet Take 2 tablets (650 mg total) by mouth every 6 (six) hours as needed. 10/01/20   Mesner, Corene Cornea, MD  albuterol (PROVENTIL HFA;VENTOLIN HFA) 108 (90 Base) MCG/ACT inhaler Inhale 2 puffs into the lungs every 4 (four) hours as needed for wheezing or shortness of breath (cough). 11/20/16   Street, [place], PA-C  aspirin EC 81 MG EC tablet Take 1 tablet (81 mg total) by mouth daily. Swallow whole. 07/17/20   Bhagat, Crista Luria, PA  beclomethasone (QVAR) 80 MCG/ACT inhaler Inhale 1 puff into the lungs 2 (two) times daily as needed (sob/wheezing).     [provider]  budesonide-formoterol (SYMBICORT) 80-4.5 MCG/ACT inhaler Inhale 2 puffs into the lungs daily.  02/15/22     cyclobenzaprine (FLEXERIL) 10 MG tablet Take 1 tablet (10 mg total) by mouth 2 (two) times daily as needed for muscle spasms. 10/01/20   Mesner, Corene Cornea, MD  diltiazem (CARTIA XT) 120 MG 24 hr capsule Take 1 capsule (120 mg total) by mouth daily. 02/01/22   Chandrasekhar, Lyda Kalata A, MD  EPINEPHrine (EPIPEN JR) 0.15 MG/0.3ML injection Inject 0.15 mg into the muscle daily as needed for anaphylaxis.     [provider]  ergocalciferol (DRISDOL) 200 MCG/ML drops Take by mouth daily.    [provider]  Evolocumab (REPATHA SURECLICK) 737 MG/ML SOAJ Inject 1 pen into the skin every 14 (fourteen) days. 11/29/21   Werner Lean, MD  fluticasone (FLOVENT HFA) 44 MCG/ACT inhaler as needed. 02/03/21   [provider]  furosemide (LASIX) 20 MG tablet Take 0.5 tablets (10 mg total) by mouth daily as needed for fluid retention or swelling 12/06/21   Chandrasekhar, Mahesh A, MD  gabapentin (NEURONTIN) 600 MG tablet Take 600 mg by mouth at bedtime.    [provider]  hydrocortisone (ANUSOL-HC) 2.5 % rectal cream Apply 1 application topically 2 (two) times daily for hemorrhoids for 14 days. 12/09/21     hydrocortisone (ANUSOL-HC) 2.5 % rectal cream as needed. hemorrhoids 12/09/21   [provider]  hydrOXYzine (ATARAX/VISTARIL) 25 MG tablet Take 25 mg by mouth 3 (three) times daily as needed for anxiety or itching.  10/09/19   [provider]  isosorbide mononitrate (IMDUR) 30 MG 24 hr tablet Take 1 tablet (30 mg total) by mouth daily. 12/15/21   Chandrasekhar, Mahesh A, MD  ketoconazole (NIZORAL) 2 % cream Apply 1 application topically 2 (two) times daily for 14 days. 12/09/21     lidocaine (XYLOCAINE) 5 % ointment Apply 1 application topically as needed. 09/02/21   Sherrell Puller, PA-C  naproxen (NAPROSYN) 500 MG tablet Take 1 tablet (500 mg total) by mouth 2 (two) times daily. 4/74/25   Delora Fuel, MD  nitroGLYCERIN (NITROSTAT) 0.4 MG SL tablet PLACE 1  TABLET (0.4 MG TOTAL) UNDER THE TONGUE EVERY 5 (FIVE) MINUTES FOR 3 DOSES AS NEEDED FOR CHEST PAIN. 06/22/22   Chandrasekhar, Mahesh A, MD  oxyCODONE (ROXICODONE) 5 MG immediate release tablet Take 1 tablet (5 mg total) by mouth every 4 (four) hours as needed for severe pain. 9/56/38   Delora Fuel, MD  pantoprazole (PROTONIX) 20 MG tablet Take 1 tablet (20 mg total) by mouth daily. 02/15/22     polyethylene glycol (MIRALAX / GLYCOLAX) 17 g packet Take 17 g by mouth every other day.    [provider]  potassium chloride SA (KLOR-CON M) 20 MEQ tablet Take 1 tablet (20 mEq total) by mouth daily. 7/56/43   Delora Fuel, MD  potassium chloride SA (KLOR-CON) 20 MEQ tablet Take 20 mEq by mouth daily.    [provider]  predniSONE (DELTASONE) 50 MG tablet Take 1 tablet (50 mg total) by mouth daily. 12/07/49   Delora Fuel, MD  promethazine-dextromethorphan (PROMETHAZINE-DM) 6.25-15 MG/5ML syrup Take 5 mLs by mouth every 6 (six) hours if needed 02/08/22     rosuvastatin (CRESTOR) 40 MG tablet Take 1 tablet (40 mg total) by mouth daily. 02/28/22   Werner Lean, MD  senna (SENOKOT) 8.6 MG TABS tablet Take 1 tablet by mouth as needed.    [provider]  tiZANidine (ZANAFLEX) 4 MG tablet Take 1 tablet (4 mg total) by mouth every 6 (six) hours as needed for muscle spasms. 8/84/16   Delora Fuel, MD  valACYclovir (VALTREX) 1000 MG tablet as needed.    [provider]  potassium chloride (KLOR-CON) 10 MEQ tablet Take 1 tablet (10 mEq total) by mouth daily for 5 days. 04/14/22 06/02/22  Elgie Congo, MD      Allergies    Pfizer-biontech Debbe Odea vacc Debbe Odea mrna vacc (moderna)], Chocolate, Ivp dye [iodinated contrast media], Latex, Shellfish allergy, Fish-derived products, Peanut-containing drug products, and Soap    Review of Systems   Review of Systems  Physical Exam Updated Vital Signs BP 117/69   Pulse 63   Temp 98 F (36.7 C) (Oral)   Resp 16   LMP  07/12/2016 (Approximate) Comment: 2-3 day cycle  SpO2 98%  Physical Exam Vitals and nursing note reviewed.  Constitutional:      General: She is not in acute distress.    Appearance: Normal appearance.  HENT:     Head: Normocephalic.     Comments: Patient has some diffuse swelling of her face    Right Ear: External ear normal.     Left Ear: External ear normal.     Nose: Nose normal.     Mouth/Throat:     Mouth: Mucous membranes are moist.     Pharynx: Oropharynx is clear.     Comments: No tongue swelling no angioedema Eyes:     Pupils: Pupils are equal, round, and reactive to light.  Cardiovascular:  Rate and Rhythm: Normal rate and regular rhythm.     Pulses: Normal pulses.     Heart sounds: Normal heart sounds.  Pulmonary:     Effort: Pulmonary effort is normal.     Breath sounds: Normal breath sounds.  Abdominal:     Palpations: Abdomen is soft.  Musculoskeletal:        General: Normal range of motion.     Cervical back: Normal range of motion.  Skin:    General: Skin is warm and dry.     Capillary Refill: Capillary refill takes less than 2 seconds.     Comments: Patient has contact dermatitis from hairline back into scalp  Neurological:     General: No focal deficit present.     Mental Status: She is alert.  Psychiatric:        Mood and Affect: Mood normal.     ED Results / Procedures / Treatments   Labs (all labs ordered are listed, but only abnormal results are displayed) Labs Reviewed - No data to display  EKG None  Radiology No results found.  Procedures Procedures    Medications Ordered in ED Medications  famotidine (PEPCID) tablet 20 mg (20 mg Oral Given 07/12/22 1239)  diphenhydrAMINE (BENADRYL) capsule 50 mg (50 mg Oral Given 07/12/22 1239)  dexamethasone (DECADRON) injection 10 mg (10 mg Intramuscular Given 07/12/22 1241)    ED Course/ Medical Decision Making/ A&P                           Medical Decision Making 56 year old female  presents today with contact dermatitis of scalp now with some swelling of her face. There is not appear to be any respiratory involvement although she does states she had some dyspnea earlier.  She does not have any wheezing and oxygen saturations and respiratory rate are normal. Patient appears to have no involvement of swelling in the mouth or throat. Blood pressure and heart rate have been normal. Differential diagnosis includes but is not limited to contact dermatitis, acute allergic reaction, anaphylaxis, Patient does not appear to have anaphylaxis based on exam and history Contact dermatitis appears to have some increased local involvement with some edema noted of her face although no airway involvement and she is able to open her eyes normally. Will add Benadryl and give a shot of Decadron and observe here in ED Patient remains stable with some decreased facial swelling.  She is requesting cream for her scalp No airway involvment, hemodynamically stable for d/c   Risk Prescription drug management.           Final Clinical Impression(s) / ED Diagnoses Final diagnoses:  None    Rx / DC Orders ED Discharge Orders          Ordered    triamcinolone cream (KENALOG) 0.1 %  2 times daily        07/12/22 1308              Pattricia Boss, MD 07/12/22 1543

## 2022-07-12 NOTE — Discharge Instructions (Signed)
Please continue your oral prednisone Use steroid cream as prescribed Return if any difficulty breathing or lightheadedness

## 2022-07-21 ENCOUNTER — Other Ambulatory Visit (HOSPITAL_COMMUNITY): Payer: Self-pay

## 2022-07-21 ENCOUNTER — Telehealth: Payer: Self-pay

## 2022-07-21 NOTE — Telephone Encounter (Signed)
        Patient  visited [facility] on 11/1  f   Telephone encounter attempt :  1st  A HIPAA compliant voice message was left requesting a return call.  Instructed patient to call back    [facility] [employer]  [phone] [address], [place], [place] [address] Phone: [phone] Email: [email or web address]

## 2022-07-24 ENCOUNTER — Other Ambulatory Visit (HOSPITAL_COMMUNITY): Payer: Self-pay

## 2022-08-11 DIAGNOSIS — L209 Atopic dermatitis, unspecified: Secondary | ICD-10-CM | POA: Diagnosis not present

## 2022-08-11 DIAGNOSIS — E559 Vitamin D deficiency, unspecified: Secondary | ICD-10-CM | POA: Diagnosis not present

## 2022-08-11 DIAGNOSIS — G8929 Other chronic pain: Secondary | ICD-10-CM | POA: Diagnosis not present

## 2022-08-11 DIAGNOSIS — I700 Atherosclerosis of aorta: Secondary | ICD-10-CM | POA: Diagnosis not present

## 2022-08-11 DIAGNOSIS — I10 Essential (primary) hypertension: Secondary | ICD-10-CM | POA: Diagnosis not present

## 2022-08-11 DIAGNOSIS — I25119 Atherosclerotic heart disease of native coronary artery with unspecified angina pectoris: Secondary | ICD-10-CM | POA: Diagnosis not present

## 2022-08-11 DIAGNOSIS — G540 Brachial plexus disorders: Secondary | ICD-10-CM | POA: Diagnosis not present

## 2022-08-11 DIAGNOSIS — J439 Emphysema, unspecified: Secondary | ICD-10-CM | POA: Diagnosis not present

## 2022-08-11 DIAGNOSIS — Z23 Encounter for immunization: Secondary | ICD-10-CM | POA: Diagnosis not present

## 2022-08-11 DIAGNOSIS — Z0000 Encounter for general adult medical examination without abnormal findings: Secondary | ICD-10-CM | POA: Diagnosis not present

## 2022-08-11 DIAGNOSIS — I252 Old myocardial infarction: Secondary | ICD-10-CM | POA: Diagnosis not present

## 2022-08-21 ENCOUNTER — Other Ambulatory Visit (HOSPITAL_COMMUNITY): Payer: Self-pay

## 2022-08-25 ENCOUNTER — Ambulatory Visit: Payer: Medicare Other | Admitting: Internal Medicine

## 2022-08-25 ENCOUNTER — Encounter (HOSPITAL_COMMUNITY): Payer: Self-pay | Admitting: Emergency Medicine

## 2022-08-25 ENCOUNTER — Other Ambulatory Visit: Payer: Self-pay

## 2022-08-25 ENCOUNTER — Ambulatory Visit (HOSPITAL_COMMUNITY)
Admission: EM | Admit: 2022-08-25 | Discharge: 2022-08-25 | Disposition: A | Discharge status: Home / Self Care | Payer: Medicare Other | Attending: Emergency Medicine | Admitting: Emergency Medicine

## 2022-08-25 ENCOUNTER — Encounter: Payer: Self-pay | Admitting: Internal Medicine

## 2022-08-25 VITALS — BP 100/60 | HR 86 | Temp 98.2°F | Ht 59.0 in | Wt 203.9 lb

## 2022-08-25 DIAGNOSIS — J310 Chronic rhinitis: Secondary | ICD-10-CM

## 2022-08-25 DIAGNOSIS — L501 Idiopathic urticaria: Secondary | ICD-10-CM

## 2022-08-25 DIAGNOSIS — M545 Low back pain, unspecified: Secondary | ICD-10-CM | POA: Diagnosis not present

## 2022-08-25 DIAGNOSIS — J449 Chronic obstructive pulmonary disease, unspecified: Secondary | ICD-10-CM

## 2022-08-25 DIAGNOSIS — K219 Gastro-esophageal reflux disease without esophagitis: Secondary | ICD-10-CM | POA: Diagnosis not present

## 2022-08-25 DIAGNOSIS — M79672 Pain in left foot: Secondary | ICD-10-CM

## 2022-08-25 DIAGNOSIS — S161XXA Strain of muscle, fascia and tendon at neck level, initial encounter: Secondary | ICD-10-CM

## 2022-08-25 DIAGNOSIS — L253 Unspecified contact dermatitis due to other chemical products: Secondary | ICD-10-CM

## 2022-08-25 MED ORDER — KETOROLAC TROMETHAMINE 30 MG/ML IJ SOLN
INTRAMUSCULAR | Status: AC
  Filled 2022-08-25: qty 1

## 2022-08-25 MED ORDER — FLUTICASONE PROPIONATE 50 MCG/ACT NA SUSP: 2.0000 | Freq: Every day | NASAL | 5 refills | Status: DC

## 2022-08-25 MED ORDER — KETOROLAC TROMETHAMINE 60 MG/2ML IM SOLN
30.0000 mg | Freq: Once | INTRAMUSCULAR | Status: AC
  Administered 2022-08-25: 30 mg via INTRAMUSCULAR

## 2022-08-25 MED ORDER — PANTOPRAZOLE SODIUM 20 MG PO TBEC
20.0000 mg | DELAYED_RELEASE_TABLET | Freq: Every day | ORAL | 3 refills | Status: DC
Start: 2022-08-25 — End: 2023-05-02

## 2022-08-25 NOTE — Discharge Instructions (Addendum)
Alternate tylenol and ibuprofen over the next few days for pain and inflammation. You can take the muscle relaxer at night.  Try gentle stretching.  It may take several days to a week for you to feel back to normal.  Please return or follow with your primary care provider as needed.
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
## 2022-08-25 NOTE — Progress Notes (Signed)
NEW PATIENT  Date of Service/Encounter:  08/25/22  Consult requested by: Johna Roles, PA   Subjective:   Wendy [name] (DOB: 15-Nov-1965) is a 56 y.o. female who presents to the clinic on 08/25/2022 with a chief complaint of Establish Care and Allergic Reaction (Pt states she put hair dye in broke out in hives x 1 month ago) .    History obtained from: chart review and patient.   Contact Dermatitis Reports around end of October, she broke out in a blister like rash with eyelid swelling after using a hair dye.  She had to be seen in the ER twice for this after seeing her PCP and was told to complete her course of prednisone given by her PCP; at the second ER visit, she was given Decadron shot and benadryl.  She also has noticed certain perfumes and soaps break her out. She now avoids all hair dye.  Rhinitis:  Started around age 51s  Symptoms include: nasal congestion, rhinorrhea, post nasal drainage, watery eyes, and itchy eyes  Occurs year-round Potential triggers: none Treatments tried:  Zyrtec PRN Flonase PRN  Previous allergy testing: yes a few years ago but can't recall results  History of reflux/heartburn: yes on PPI and it is helping History of chronic sinusitis or sinus surgery: no Nonallergic triggers: strong odors   Concern for Food Allergy:  She was breaking out for years with hives and they thought maybe it was food related.   Peanut: she is eating peanuts and has not had issues recently.  Fish: hives, nausea with some fish but not all of them. She is able to eat trout and fish sandwich.   Shellfish: she is eating shellfish and has not had any issues. Chocolate: she is eating chocolate now without any issues.   Chronic Urticaria/Angioedema: She randomly breaks out in hives and this started a few years ago.  No clear triggers. Usually lasts less than a day.  Hives are itchy, red and raised. She has had facial swelling in the past with it.  No recent  episodes in the past 1 month.  She is not taking anything for this.   COPD:  She also has COPD. She is managed by her PCP and is prescribed Symbicort 81mg BID but forgets to take it many times.  Has smoked about 30 years, 1ppd, quit 2021 after her heart attack. She does report some episodes of shortness of breath and uses albuterol due to SOB about 2-3x/week.  CT chest 11/2020 for lung cancer screening showed mild centrilobular emphysema. Denies any history of childhood asthma and reports her symptoms started about 2 years ago.    Past Medical History: Past Medical History:  Diagnosis Date   Anxiety    Asthma    daily and prn inhalers   Cervical disc disease    decreased range of motion   Chronic pain    shoulders, neck, lower back   Coronary artery disease    Deformity, hand    left   Depression    Eczema    Endometriosis    Lupron injection Q 3 mos.   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   Urticaria    Wears partial dentures  upper partial    Past Surgical History: Past Surgical History:  Procedure Laterality Date   ANKLE RECONSTRUCTION  02/01/2012   Procedure: RECONSTRUCTION ANKLE;  Surgeon: Wylene Simmer, MD;  Location: [facility];  Service: Orthopedics;  Laterality: Right;  Excision of right calcaneonavicular coalition with autologus fat graft interposition   BRACHIAL PLEXUS EXPLORATION     CARDIAC CATHETERIZATION     CORONARY STENT INTERVENTION N/A 07/15/2020   Procedure: CORONARY STENT INTERVENTION;  Surgeon: Jettie Booze, MD;  Location: [facility];  Service: Cardiovascular;  Laterality: N/A;   DIAGNOSTIC LAPAROSCOPY  10/02/2008   peritoneal bx.   LEFT HEART CATH AND CORONARY ANGIOGRAPHY N/A 07/15/2020   Procedure: LEFT HEART CATH  AND CORONARY ANGIOGRAPHY;  Surgeon: Jettie Booze, MD;  Location: [facility];  Service: Cardiovascular;  Laterality: N/A;   MULTIPLE TOOTH EXTRACTIONS     upper teeth and wisdom teeth   OVARIAN CYST REMOVAL  2000   RIB RESECTION     left - cervical rib removal   SHOULDER ARTHROSCOPY W/ ROTATOR CUFF REPAIR  07/2011   right   SHOULDER SURGERY     left   TOOTH EXTRACTION     x 1    Family History: Family History  Problem Relation Age of Onset   Cancer Mother    Dementia Father    Stroke Father     Social History:  Lives in a 56 year house Flooring in bedroom: laminate Pets: none Tobacco use/exposure: quit 2021; 30 year history, 1ppd Job: PCA; helps elderly  Medication List:  Allergies as of 08/25/2022       Reactions   Pfizer-biontech Covid-19 Vacc [covid-19 Mrna Vacc (moderna)] Shortness Of Breath, Swelling   Tightness in Chest   Chocolate Hives   Ivp Dye [iodinated Contrast Media] Nausea And Vomiting   After CT scan   Latex Hives, Itching, Swelling   Shellfish Allergy    Fish-derived Products Nausea And Vomiting   Peanut-containing Drug Products Hives   Soap Rash        Medication List        Accurate as of August 25, 2022  2:47 PM. If you have any questions, ask your nurse or doctor.          acetaminophen 325 MG tablet Commonly known as: TYLENOL Take 2 tablets (650 mg total) by mouth every 6 (six) hours as needed.   albuterol 108 (90 Base) MCG/ACT inhaler Commonly known as: VENTOLIN HFA Inhale 2 puffs into the lungs every 4 (four) hours as needed for wheezing or shortness of breath (cough).   aspirin EC 81 MG tablet Take 1 tablet (81 mg total) by mouth daily. Swallow whole.   beclomethasone 80 MCG/ACT inhaler Commonly known as: QVAR Inhale 1 puff into the lungs 2 (two) times daily as needed (sob/wheezing).   Cartia XT 120 MG 24 hr capsule Generic drug: diltiazem Take 1 capsule (120 mg total) by mouth daily.    cyclobenzaprine 10 MG tablet Commonly known as: FLEXERIL Take 1 tablet (10 mg total) by mouth 2 (two) times daily as needed for muscle spasms.   EPINEPHrine 0.15 MG/0.3ML injection Commonly known as: EPIPEN JR Inject 0.15 mg into the muscle daily as needed for anaphylaxis.   ergocalciferol (VITAMIN D2) 200 MCG/ML drops Commonly known as: DRISDOL Take by mouth daily.   fluticasone 44 MCG/ACT inhaler Commonly known as: FLOVENT HFA as needed.   furosemide 20 MG tablet Commonly known as: LASIX Take  0.5 tablets (10 mg total) by mouth daily as needed for fluid retention or swelling   gabapentin 600 MG tablet Commonly known as: NEURONTIN Take 600 mg by mouth at bedtime.   hydrOXYzine 25 MG tablet Commonly known as: ATARAX Take 25 mg by mouth 3 (three) times daily as needed for anxiety or itching.   isosorbide mononitrate 30 MG 24 hr tablet Commonly known as: IMDUR Take 1 tablet (30 mg total) by mouth daily.   ketoconazole 2 % cream Commonly known as: NIZORAL Apply 1 application topically 2 (two) times daily for 14 days.   lidocaine 5 % ointment Commonly known as: XYLOCAINE Apply 1 application topically as needed.   naproxen 500 MG tablet Commonly known as: NAPROSYN Take 1 tablet (500 mg total) by mouth 2 (two) times daily.   nitroGLYCERIN 0.4 MG SL tablet Commonly known as: NITROSTAT PLACE 1 TABLET (0.4 MG TOTAL) UNDER THE TONGUE EVERY 5 (FIVE) MINUTES FOR 3 DOSES AS NEEDED FOR CHEST PAIN.   oxyCODONE 5 MG immediate release tablet Commonly known as: Roxicodone Take 1 tablet (5 mg total) by mouth every 4 (four) hours as needed for severe pain.   pantoprazole 20 MG tablet Commonly known as: PROTONIX Take 1 tablet (20 mg total) by mouth daily.   polyethylene glycol 17 g packet Commonly known as: MIRALAX / GLYCOLAX Take 17 g by mouth every other day.   potassium chloride SA 20 MEQ tablet Commonly known as: KLOR-CON M Take 20 mEq by mouth daily.   potassium  chloride SA 20 MEQ tablet Commonly known as: KLOR-CON M Take 1 tablet (20 mEq total) by mouth daily.   predniSONE 50 MG tablet Commonly known as: DELTASONE Take 1 tablet (50 mg total) by mouth daily.   Proctozone-HC 2.5 % rectal cream Generic drug: hydrocortisone Apply 1 application topically 2 (two) times daily for hemorrhoids for 14 days.   hydrocortisone 2.5 % rectal cream Commonly known as: ANUSOL-HC as needed. hemorrhoids   promethazine-dextromethorphan 6.25-15 MG/5ML syrup Commonly known as: PROMETHAZINE-DM Take 5 mLs by mouth every 6 (six) hours if needed   Repatha SureClick 956 MG/ML Soaj Generic drug: Evolocumab Inject 1 pen into the skin every 14 (fourteen) days.   rosuvastatin 40 MG tablet Commonly known as: CRESTOR Take 1 tablet (40 mg total) by mouth daily.   senna 8.6 MG Tabs tablet Commonly known as: SENOKOT Take 1 tablet by mouth as needed.   Symbicort 80-4.5 MCG/ACT inhaler Generic drug: budesonide-formoterol Inhale 2 puffs into the lungs daily.   tiZANidine 4 MG tablet Commonly known as: ZANAFLEX Take 1 tablet (4 mg total) by mouth every 6 (six) hours as needed for muscle spasms.   triamcinolone cream 0.1 % Commonly known as: KENALOG Apply 1 Application topically 2 (two) times daily.   valACYclovir 1000 MG tablet Commonly known as: VALTREX as needed.         REVIEW OF SYSTEMS: Pertinent positives and negatives discussed in HPI.   Objective:   Physical Exam: BP 100/60 (BP Location: Right Arm, Patient Position: Sitting, Cuff Size: Large)   Pulse 86   Temp 98.2 F (36.8 C) (Temporal)   Ht '4\' 11"'$  (1.499 m)   Wt 203 lb 14.4 oz (92.5 kg)   LMP 07/12/2016 (Approximate) Comment: 2-3 day cycle  SpO2 99%   BMI 41.18 kg/m  Body mass index is 41.18 kg/m. 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 Chronic rhinitis   3. Idiopathic urticaria   4. Gastroesophageal reflux disease, unspecified whether esophagitis present     Plan/Recommendations:  Chronic Rhinitis, Uncontrolled - Hold all anti histamines until next visit. We will perform skin testing 1-59 at the time.  - Use nasal saline rinses before nose sprays such as with Neilmed Sinus Rinse.  Use distilled water.   - Use Flonase 2 sprays each nostril daily. Aim upward and outward.  Contact Dermatitis, Resolved - The facial rash/swelling is likely due to contact dermatitis to chemical in hair dye. Recommend avoidance of hair dye. If it recurs, we can consider patch testing to identify triggers.   - Prior to use of new products, recommend testing them behind the ear for a few days and watching for reaction for 1 week prior to its use.    GERD, Uncontrolled - Continue Protonix '20mg'$  daily.  - Lifestyle measures discussed.   Idiopathic Urticaria (Hives), Stable - At this time etiology of hives and swelling is unknown. Hives can be caused by a variety of different triggers including illness/infection, exercise, pressure, vibrations, extremes of temperature to name a few however majority of the time there is no identifiable trigger.  -Will discuss anti histamines regimen at next.    Food reactions - Continue eating fish, shellfish, peanuts and chocolate.  You do not have an allergy to these as you are not having reactions when you consume them.  Chronic hives are rarely caused by foods and therefore, these are not the cause for your hives.  - No need for an Epipen.  COPD - Continue follow up with PCP.  If uncontrolled can consider Pulm referral. -  Remember to take Symbicort 90mg 2 puffs twice daily, not as needed. - Rescue inhaler: Albuterol 2 puffs via spacer every 4-6 hours as needed for respiratory symptoms of cough, shortness of breath, or wheezing     Return in about 1 week (around 09/01/2022).  PHarlon Flor MD [facility]

## 2022-08-25 NOTE — Patient Instructions (Addendum)
Chronic Rhinitis: - Hold all anti histamines until next visit.  - Use nasal saline rinses before nose sprays such as with Neilmed Sinus Rinse.  Use distilled water.   - Use Flonase 2 sprays each nostril daily. Aim upward and outward.  Food reactions:  - Continue eating fish, shellfish, peanuts and chocolate.  You do not have an allergy to these as you are not having reactions when you consume them.  Chronic hives are rarely caused by foods and therefore, these are not the cause for your hives.  - No need for an Epipen.  Contact Dermatitis - The facial rash/swelling is likely due to contact dermatitis to chemical in hair dye. Recommend avoidance of hair dye. If it recurs, we can consider patch testing.   - Prior to use of new products, recommend testing them behind the ear for a few days and watching for reaction for 1 week prior to its use.    GERD: - Continue Protonix '20mg'$  daily.  -Avoid lying down for at least two hours after a meal or after drinking acidic beverages, like soda, or other caffeinated beverages. This can help to prevent stomach contents from flowing back into the esophagus. -Keep your head elevated while you sleep. Using an extra pillow or two can also help to prevent reflux. -Eat smaller and more frequent meals each day instead of a few large meals. This promotes digestion and can aid in preventing heartburn. -Wear loose-fitting clothes to ease pressure on the stomach, which can worsen heartburn and reflux. -Quit smoking. Smoking can increase the production of stomach acid and reduce the function of the lower esophageal sphincter, the muscle that keeps acid and other stomach content from reentering the esophagus. Smoking can also decrease the amount of saliva, which neutralizes acid produced by the body.  -Reduce excess weight around the midsection. This can ease pressure on the stomach. Such pressure can force some stomach contents back up the esophagus.   Idiopathic  Urticaria (Hives): - At this time etiology of hives and swelling is unknown. Hives can be caused by a variety of different triggers including illness/infection, exercise, pressure, vibrations, extremes of temperature to name a few however majority of the time there is no identifiable trigger.  -Will discuss anti histamines regimen at next.     COPD - Continue follow up with PCP.  If uncontrolled can consider Pulm referral. - Remember to take Symbicort 96mg 2 puffs twice daily, not as needed. - Rescue inhaler: Albuterol 2 puffs via spacer every 4-6 hours as needed for respiratory symptoms of cough, shortness of breath, or wheezing

## 2022-08-29 ENCOUNTER — Encounter: Payer: Self-pay | Admitting: Internal Medicine

## 2022-08-29 ENCOUNTER — Ambulatory Visit (INDEPENDENT_AMBULATORY_CARE_PROVIDER_SITE_OTHER): Payer: Medicare Other | Admitting: Internal Medicine

## 2022-08-29 ENCOUNTER — Other Ambulatory Visit: Payer: Self-pay

## 2022-08-29 VITALS — BP 110/60 | HR 84 | Temp 97.6°F | Resp 16 | Wt 205.6 lb

## 2022-08-29 DIAGNOSIS — J3089 Other allergic rhinitis: Secondary | ICD-10-CM

## 2022-08-29 DIAGNOSIS — L501 Idiopathic urticaria: Secondary | ICD-10-CM | POA: Diagnosis not present

## 2022-08-29 DIAGNOSIS — K219 Gastro-esophageal reflux disease without esophagitis: Secondary | ICD-10-CM

## 2022-08-29 DIAGNOSIS — Z03818 Encounter for observation for suspected exposure to other biological agents ruled out: Secondary | ICD-10-CM | POA: Diagnosis not present

## 2022-08-29 MED ORDER — FLUTICASONE PROPIONATE 50 MCG/ACT NA SUSP: 2.0000 | Freq: Every day | NASAL | 5 refills | Status: DC

## 2022-08-29 MED ORDER — AZELASTINE HCL 0.1 % NA SOLN: 1.0000 | Freq: Two times a day (BID) | NASAL | 5 refills | Status: DC

## 2022-08-29 MED ORDER — FEXOFENADINE HCL 180 MG PO TABS: 180.0000 mg | ORAL_TABLET | Freq: Every day | ORAL | 5 refills | Status: DC

## 2022-08-29 NOTE — Patient Instructions (Addendum)
Chronic Rhinitis: - Positive skin test 08/2022: none - Avoidance measures discussed. - Use nasal saline rinses before nose sprays such as with Neilmed Sinus Rinse.  Use distilled water.   - Use Flonase 2 sprays each nostril daily. Aim upward and outward. - Use Azelastine 1-2 sprays each nostril twice daily as needed. Aim upward and outward.  GERD: -Continue Protonix '20mg'$  daily.  -Avoid lying down for at least two hours after a meal or after drinking acidic beverages, like soda, or other caffeinated beverages. This can help to prevent stomach contents from flowing back into the esophagus. -Keep your head elevated while you sleep. Using an extra pillow or two can also help to prevent reflux. -Eat smaller and more frequent meals each day instead of a few large meals. This promotes digestion and can aid in preventing heartburn. -Wear loose-fitting clothes to ease pressure on the stomach, which can worsen heartburn and reflux. -Reduce excess weight around the midsection. This can ease pressure on the stomach. Such pressure can force some stomach contents back up the esophagus.   Idiopathic Urticaria (Hives), Stable  - At this time etiology of hives and swelling is unknown. Hives can be caused by a variety of different triggers including illness/infection, exercise, pressure, vibrations, extremes of temperature to name a few however majority of the time there is no identifiable trigger.  -If hives become frequent or are uncontrolled on anti-histamines, we will consider lab work.  -Start Allegra '180mg'$  daily.   -If no improvement in 1 week, increase to Allegra '180mg'$  twice daily.   -If no improvement in 1 week, add Pepcid '20mg'$  twice daily and continue Allegra '180mg'$  twice daily.  COPD - Continue follow up with PCP.  If uncontrolled with coughing, wheezing, shortness of breath, can consider Pulm referral. - Remember to take Symbicort 8mg 2 puffs twice daily, not as needed. - Rescue inhaler: Albuterol  2 puffs via spacer every 4-6 hours as needed for respiratory symptoms of cough, shortness of breath, or wheezing

## 2022-08-29 NOTE — Progress Notes (Signed)
FOLLOW UP Date of Service/Encounter:  08/29/22   Subjective:  Wendy [name] (DOB: 1965-11-24) is a 56 y.o. female who returns to the [facility] [place] on 08/29/2022 for follow up for skin testing.   History obtained from: chart review and patient.  Doing okay since last visit and has noticed maybe a mild upper respiratory infection with congestion/drainage/cough but no high fevers, COVID/Flu/RSV negative.    Past Medical History: Past Medical History:  Diagnosis Date   Anxiety    Asthma    daily and prn inhalers   Cervical disc disease    decreased range of motion   Chronic pain    shoulders, neck, lower back   Coronary artery disease    Deformity, hand    left   Depression    Eczema    Endometriosis    Lupron injection Q 3 mos.   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 Physical Exam: GEN: alert, well developed HEENT: clear conjunctiva, MMM HEART: regular rate  LUNGS: no coughing, unlabored respiration SKIN: no rashes or lesions  Skin Testing:  Skin prick testing was placed, which includes aeroallergens/foods, histamine control, and saline control.  Verbal consent was obtained prior to placing test.  Patient tolerated procedure well.  Allergy testing results were read and interpreted by myself, documented by clinical staff. Adequate positive and negative control.  Positive results to:  Results discussed with patient/family.  Airborne Adult Perc - 08/29/22 1644      Time Antigen Placed 1614    Allergen Manufacturer Lavella Hammock    Location Back    Number of Test 59    1. Control-Buffer 50% Glycerol Negative    2. Control-Histamine 1 mg/ml 3+    3. Albumin saline Negative    4. [place] Negative    5. [place] Negative    6. Johnson Negative    7. Strat[place] Blue Negative    8. Meadow Fescue Negative    9. Perennial Rye Negative    10. Sweet Vernal Negative    11. Timothy Negative    12. Cocklebur Negative    13. Burweed Marshelder Negative    14. Ragweed, short Negative    15. Ragweed, Giant Negative    16. Plantain,  English Negative    17. Lamb's Quarters Negative    18. Sheep Sorrell Negative    19. Rough Pigweed Negative    20. Marsh Elder, Rough Negative    21. Mugwort, Common Negative    22. Ash mix Negative    23. Birch mix Negative    24. Beech American Negative    25. Box, Elder Negative    26. Cedar, red Negative    27. Cottonwood, [place] Negative    28. Elm mix Negative    29. Hickory Negative    30. Maple mix Negative  [place], [place] mix Negative    32. Pecan Pollen Negative    33. Pine mix Negative    34. Sycamore Eastern Negative    35. [place], Black Pollen Negative    36. Alternaria alternata Negative    37. Cladosporium Herbarum Negative    38. Aspergillus mix Negative    39. Penicillium mix Negative    40. Bipolaris sorokiniana (Helminthosporium) Negative    41. Drechslera spicifera (Curvularia) Negative    42. Mucor plumbeus Negative    43. Fusarium moniliforme Negative    44. Aureobasidium pullulans (pullulara) Negative    45. Rhizopus oryzae Negative    46. Botrytis cinera Negative    47. Epicoccum nigrum Negative    48. Phoma betae Negative    49. Candida Albicans Negative    50. Trichophyton mentagrophytes Negative    51. Mite, D Farinae  5,000 AU/ml Negative    52. Mite, D Pteronyssinus  5,000 AU/ml Negative    53. Cat Hair 10,000 BAU/ml Negative    54.  Dog Epithelia Negative    55. Mixed Feathers  Negative    56. Horse Epithelia Negative    57. Cockroach, German Negative    58. Mouse Negative    59. Tobacco Leaf Negative             Intradermal - 08/29/22 1645     Time Antigen Placed 1630    Allergen Manufacturer Lavella Hammock    Location Arm    Number of Test 15    Control Negative    [place] Negative    Johnson Negative    7 Grass Negative    Ragweed mix Negative    Weed mix Negative    Tree mix Negative    Mold 1 Negative    Mold 2 Negative    Mold 3 Negative    Mold 4 Negative    Cat Negative    Dog Negative    Cockroach Negative    Mite mix Negative              Assessment/Plan  Chronic Rhinitis: - Positive skin test 08/2022: none - Avoidance measures discussed. - Use nasal saline rinses before nose sprays such as with Neilmed Sinus Rinse.  Use distilled water.   - Use Flonase 2 sprays each nostril daily. Aim upward and outward. - Use Azelastine 1-2 sprays each nostril twice daily as needed. Aim upward and outward.  GERD: -Continue Protonix '20mg'$  daily.  -Avoid lying down for at least two hours after a meal or after drinking acidic beverages, like soda, or other caffeinated beverages. This can help to prevent stomach contents from flowing back into the esophagus. -Keep your head elevated while you sleep. Using an extra pillow or two can also help to prevent reflux. -Eat smaller and more frequent meals each day instead of a few large meals. This promotes digestion and can aid in preventing heartburn. -Wear loose-fitting clothes to ease pressure on the stomach, which can worsen heartburn and reflux. -Reduce excess weight around the midsection. This can ease pressure on the stomach. Such pressure can force some stomach contents back up the esophagus.   Idiopathic Urticaria (Hives), Stable  - At this time etiology of hives and swelling is unknown. Hives can be caused by a variety of different triggers including illness/infection, exercise, pressure, vibrations,  extremes of temperature to name a few however majority of the time there is no identifiable trigger.  -If hives become frequent or are uncontrolled on anti-histamines,  we will consider lab work.  -Start Allegra '180mg'$  daily.   -If no improvement in 1 week, increase to Allegra '180mg'$  twice daily.   -If no improvement in 1 week, add Pepcid '20mg'$  twice daily and continue Allegra '180mg'$  twice daily.  COPD - Continue follow up with PCP.  If uncontrolled with coughing, wheezing, shortness of breath, can consider Pulm referral. - Remember to take Symbicort 79mg 2 puffs twice daily, not as needed. - Rescue inhaler: Albuterol 2 puffs via spacer every 4-6 hours as needed for respiratory symptoms of cough, shortness of breath, or wheezing   Return in about 2 months (around 10/30/2022). PHarlon Flor MD  [facility]

## 2022-08-30 ENCOUNTER — Ambulatory Visit: Payer: Medicare Other | Admitting: Family Medicine

## 2022-08-31 ENCOUNTER — Telehealth: Payer: Self-pay | Admitting: Internal Medicine

## 2022-08-31 ENCOUNTER — Encounter: Payer: Self-pay | Admitting: Internal Medicine

## 2022-08-31 NOTE — Telephone Encounter (Signed)
Patient states her arm is itchy & swelling up in a few spots where the intradermal testing was done on Tuesday, 08-29-2022.  Patient states she will try to send mychart message with pictures of her arm.   Please advise  Best contact [phone]

## 2022-08-31 NOTE — Telephone Encounter (Signed)
Spoke with pt she is working on getting pic to [place] on mychart but informed her she can do compress cold on the sites take benadryl and use hydrocortisone cream she stated understanding

## 2022-09-01 NOTE — Telephone Encounter (Signed)
Patient has been informed and verbalized understanding.

## 2022-09-15 ENCOUNTER — Other Ambulatory Visit (HOSPITAL_COMMUNITY): Payer: Self-pay

## 2022-09-18 ENCOUNTER — Other Ambulatory Visit (HOSPITAL_COMMUNITY): Payer: Self-pay

## 2022-09-22 DIAGNOSIS — I252 Old myocardial infarction: Secondary | ICD-10-CM | POA: Diagnosis not present

## 2022-09-22 DIAGNOSIS — I700 Atherosclerosis of aorta: Secondary | ICD-10-CM | POA: Diagnosis not present

## 2022-09-22 DIAGNOSIS — I25119 Atherosclerotic heart disease of native coronary artery with unspecified angina pectoris: Secondary | ICD-10-CM | POA: Diagnosis not present

## 2022-09-22 DIAGNOSIS — G8929 Other chronic pain: Secondary | ICD-10-CM | POA: Diagnosis not present

## 2022-09-22 DIAGNOSIS — I10 Essential (primary) hypertension: Secondary | ICD-10-CM | POA: Diagnosis not present

## 2022-09-25 ENCOUNTER — Other Ambulatory Visit (HOSPITAL_COMMUNITY): Payer: Self-pay

## 2022-09-25 MED ORDER — AMOXICILLIN 500 MG PO CAPS
500.0000 mg | ORAL_CAPSULE | Freq: Three times a day (TID) | ORAL | 0 refills | Status: DC
  Filled 2022-09-25: qty 21, 7d supply, fill #0

## 2022-09-26 ENCOUNTER — Other Ambulatory Visit (HOSPITAL_COMMUNITY): Payer: Self-pay

## 2022-09-29 DIAGNOSIS — Z0279 Encounter for issue of other medical certificate: Secondary | ICD-10-CM | POA: Diagnosis not present

## 2022-10-06 ENCOUNTER — Emergency Department (HOSPITAL_COMMUNITY)
Admission: EM | Admit: 2022-10-06 | Discharge: 2022-10-07 | Disposition: A | Discharge status: Home / Self Care | Payer: 59 | Attending: Emergency Medicine | Admitting: Emergency Medicine

## 2022-10-06 ENCOUNTER — Emergency Department (HOSPITAL_COMMUNITY): Payer: 59

## 2022-10-06 DIAGNOSIS — Z7951 Long term (current) use of inhaled steroids: Secondary | ICD-10-CM | POA: Diagnosis not present

## 2022-10-06 DIAGNOSIS — I10 Essential (primary) hypertension: Secondary | ICD-10-CM | POA: Diagnosis not present

## 2022-10-06 DIAGNOSIS — Z7982 Long term (current) use of aspirin: Secondary | ICD-10-CM | POA: Insufficient documentation

## 2022-10-06 DIAGNOSIS — I209 Angina pectoris, unspecified: Secondary | ICD-10-CM | POA: Diagnosis not present

## 2022-10-06 DIAGNOSIS — R079 Chest pain, unspecified: Secondary | ICD-10-CM | POA: Diagnosis not present

## 2022-10-06 DIAGNOSIS — Z91010 Allergy to peanuts: Secondary | ICD-10-CM | POA: Insufficient documentation

## 2022-10-06 DIAGNOSIS — I252 Old myocardial infarction: Secondary | ICD-10-CM | POA: Insufficient documentation

## 2022-10-06 DIAGNOSIS — Z91040 Latex allergy status: Secondary | ICD-10-CM | POA: Diagnosis not present

## 2022-10-06 DIAGNOSIS — R0602 Shortness of breath: Secondary | ICD-10-CM | POA: Insufficient documentation

## 2022-10-06 DIAGNOSIS — J45909 Unspecified asthma, uncomplicated: Secondary | ICD-10-CM | POA: Insufficient documentation

## 2022-10-06 DIAGNOSIS — R0789 Other chest pain: Secondary | ICD-10-CM | POA: Diagnosis present

## 2022-10-06 DIAGNOSIS — I213 ST elevation (STEMI) myocardial infarction of unspecified site: Secondary | ICD-10-CM | POA: Diagnosis not present

## 2022-10-06 DIAGNOSIS — I2510 Atherosclerotic heart disease of native coronary artery without angina pectoris: Secondary | ICD-10-CM

## 2022-10-06 LAB — CBC WITH DIFFERENTIAL/PLATELET
Abs Immature Granulocytes: 0.01 10*3/uL (ref 0.00–0.07)
Basophils Absolute: 0 10*3/uL (ref 0.0–0.1)
Basophils Relative: 1 %
Eosinophils Absolute: 0.3 10*3/uL (ref 0.0–0.5)
Eosinophils Relative: 4 %
HCT: 39.4 % (ref 36.0–46.0)
Hemoglobin: 12.7 g/dL (ref 12.0–15.0)
Immature Granulocytes: 0 %
Lymphocytes Relative: 39 %
Lymphs Abs: 2.3 10*3/uL (ref 0.7–4.0)
MCH: 28 pg (ref 26.0–34.0)
MCHC: 32.2 g/dL (ref 30.0–36.0)
MCV: 87 fL (ref 80.0–100.0)
Monocytes Absolute: 0.6 10*3/uL (ref 0.1–1.0)
Monocytes Relative: 9 %
Neutro Abs: 2.8 10*3/uL (ref 1.7–7.7)
Neutrophils Relative %: 47 %
Platelets: 251 10*3/uL (ref 150–400)
RBC: 4.53 MIL/uL (ref 3.87–5.11)
RDW: 12.9 % (ref 11.5–15.5)
WBC: 6 10*3/uL (ref 4.0–10.5)
nRBC: 0 % (ref 0.0–0.2)

## 2022-10-06 LAB — COMPREHENSIVE METABOLIC PANEL
ALT: 14 U/L (ref 0–44)
AST: 23 U/L (ref 15–41)
Albumin: 4.1 g/dL (ref 3.5–5.0)
Alkaline Phosphatase: 50 U/L (ref 38–126)
Anion gap: 9 (ref 5–15)
BUN: 8 mg/dL (ref 6–20)
CO2: 25 mmol/L (ref 22–32)
Calcium: 9.6 mg/dL (ref 8.9–10.3)
Chloride: 105 mmol/L (ref 98–111)
Creatinine, Ser: 0.78 mg/dL (ref 0.44–1.00)
GFR, Estimated: >60 mL/min (ref >60)
Glucose, Bld: 110 mg/dL — ABNORMAL HIGH (ref 70–99)
Potassium: 3.3 mmol/L — ABNORMAL LOW (ref 3.5–5.1)
Sodium: 139 mmol/L (ref 135–145)
Total Bilirubin: 0.6 mg/dL (ref 0.3–1.2)
Total Protein: 7.1 g/dL (ref 6.5–8.1)

## 2022-10-06 LAB — TROPONIN I (HIGH SENSITIVITY)
Troponin I (High Sensitivity): 5 ng/L (ref <18)
Troponin I (High Sensitivity): 3 ng/L (ref <18)

## 2022-10-06 NOTE — ED Triage Notes (Signed)
Patient BIB [facility] from home for evaluation of sudden onset of chest pain today that felt like when she had a STEMI previously. Patient self administered 1x SL NTG and 324 mg ASA. Patient reports complete resolution to pain after nitroglycerin, patient is alert, oriented, and in no apparent distress at this time.

## 2022-10-06 NOTE — ED Provider Triage Note (Signed)
Emergency Medicine Provider Triage Evaluation Note  Wendy [name] , a 57 y.o. female  was evaluated in triage.  Pt complains of shortness of breath, chest pain.  Patient with history of NSTEMI with 2 stents placed.  Patient states that she had 2 episodes 1 last night and 1 earlier today.  Had abrupt onset left-sided chest pain when she was doing her hearing.  States that she took nitroglycerin and aspirin today after episode which completely resolved symptoms.  Denies cough, congestion, fever, abdominal pain, nausea, vomiting.  Review of Systems  Positive: See above Negative:   Physical Exam  LMP 07/12/2016 (Approximate) Comment: 2-3 day cycle Gen:   Awake, no distress   Resp:  Normal effort  MSK:   Moves extremities without difficulty  Other:    Medical Decision Making  Medically screening exam initiated at 7:42 PM.  Appropriate orders placed.  Wendy [name] was informed that the remainder of the evaluation will be completed by another provider, this initial triage assessment does not replace that evaluation, and the importance of remaining in the ED until their evaluation is complete.     Wilnette Kales, [place] 10/06/22 1949

## 2022-10-07 DIAGNOSIS — I209 Angina pectoris, unspecified: Secondary | ICD-10-CM | POA: Diagnosis not present

## 2022-10-07 LAB — TROPONIN I (HIGH SENSITIVITY): Troponin I (High Sensitivity): 4 ng/L (ref <18)

## 2022-10-07 LAB — BRAIN NATRIURETIC PEPTIDE: B Natriuretic Peptide: 29.4 pg/mL (ref 0.0–100.0)

## 2022-10-07 NOTE — Discharge Instructions (Addendum)
Call the cardiology office to set up close follow up.

## 2022-10-07 NOTE — ED Provider Notes (Signed)
[facility] Provider Note  CSN: [id number] Arrival date & time: 10/06/22 2706  Chief Complaint(s) Chest Pain  HPI Wendy [name] is a 57 y.o. female     Chest Pain Pain location:  L chest Pain quality: pressure   Pain radiates to:  Upper back Onset quality:  Sudden Duration:  3 minutes Timing:  Constant Progression:  Resolved Chronicity:  Recurrent (but this was more intense than usual) Context: at rest   Relieved by:  Nitroglycerin Associated symptoms: shortness of breath   Associated symptoms: no cough, no diaphoresis, no fatigue, no headache, no nausea and no vomiting   Risk factors: coronary artery disease, high cholesterol, hypertension and obesity   Risk factors: no smoking    Pain started at 1800pm. Has not returned since being relieved with 1 NTG.  Past Medical History Past Medical History:  Diagnosis Date   Anxiety    Asthma    daily and prn inhalers   Cervical disc disease    decreased range of motion   Chronic pain    shoulders, neck, lower back   Coronary artery disease    Deformity, hand    left   Depression    Eczema    Endometriosis    Lupron injection Q 3 mos.   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   Urticaria    Wears partial dentures    upper partial   Patient Active Problem List   Diagnosis Date Noted   Nonrheumatic aortic valve insufficiency 02/13/2022   Melena 01/24/2021   DOE (dyspnea on exertion) 01/24/2021   PVC (premature ventricular contraction) 10/14/2020   Coronary artery disease involving native coronary artery of native heart without angina pectoris 07/30/2020   Essential hypertension 07/30/2020   Hyperlipidemia 07/30/2020   Gastroesophageal reflux disease without  esophagitis 07/30/2020   Nonrheumatic mitral valve regurgitation 07/30/2020   BV (bacterial vaginosis) 03/06/2013   ANXIETY 11/05/2007   DEPRESSION 11/05/2007   NAUSEA, CHRONIC 11/05/2007   FIBROIDS, UTERUS 03/26/2007   MIGRAINE, COMMON 03/26/2007   OVARIAN CYST 03/26/2007   DEGENERATIVE DISC DISEASE, CERVICAL SPINE 03/26/2007   DEGENERATIVE DISC DISEASE, LUMBOSACRAL SPINE 03/26/2007   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   Home Medication(s) Prior to Admission medications   Medication Sig Start Date End Date Taking? Authorizing Provider  acetaminophen (TYLENOL) 325 MG tablet Take 2 tablets (650 mg total) by mouth every 6 (six) hours as needed. 10/01/20   Mesner, Corene Cornea, MD  albuterol (PROVENTIL HFA;VENTOLIN HFA) 108 (90 Base) MCG/ACT inhaler Inhale 2 puffs into the lungs every 4 (four) hours as needed for wheezing or shortness of breath (cough). 11/20/16   Street, [place], PA-C  amoxicillin (AMOXIL) 500 MG capsule Take 1 capsule (500 mg total) by mouth every 8 (eight) hours for 7 days 09/25/22     aspirin EC 81 MG EC tablet Take 1 tablet (81 mg total) by mouth daily. Swallow whole. 07/17/20   Bhagat, Crista Luria, PA  azelastine (ASTELIN) 0.1 % nasal spray Place 1 spray into both nostrils 2 (two) times daily. Use in each nostril as directed 08/29/22   Larose Kells, MD  budesonide-formoterol [facility]) 80-4.5 MCG/ACT inhaler Inhale 2 puffs into the lungs daily. 02/15/22  cyclobenzaprine (FLEXERIL) 10 MG tablet Take 1 tablet (10 mg total) by mouth 2 (two) times daily as needed for muscle spasms. 10/01/20   Mesner, Corene Cornea, MD  diltiazem (CARTIA XT) 120 MG 24 hr capsule Take 1 capsule (120 mg total) by mouth daily. 02/01/22   Chandrasekhar, Lyda Kalata A, MD  EPINEPHrine (EPIPEN JR) 0.15 MG/0.3ML injection Inject 0.15 mg into the muscle daily as needed for anaphylaxis.     [provider]  ergocalciferol (DRISDOL) 200 MCG/ML drops Take by mouth daily.    [provider]  Evolocumab (REPATHA  SURECLICK) 480 MG/ML SOAJ Inject 1 pen into the skin every 14 (fourteen) days. 11/29/21   Werner Lean, MD  fexofenadine (ALLEGRA ALLERGY) 180 MG tablet Take 1 tablet (180 mg total) by mouth daily. 08/29/22   Larose Kells, MD  fluticasone (FLONASE) 50 MCG/ACT nasal spray Place 2 sprays into both nostrils daily. 08/29/22   Larose Kells, MD  fluticasone (FLOVENT HFA) 44 MCG/ACT inhaler as needed. 02/03/21   [provider]  furosemide (LASIX) 20 MG tablet Take 0.5 tablets (10 mg total) by mouth daily as needed for fluid retention or swelling 12/06/21   Chandrasekhar, Mahesh A, MD  gabapentin (NEURONTIN) 600 MG tablet Take 600 mg by mouth at bedtime.    [provider]  hydrocortisone (ANUSOL-HC) 2.5 % rectal cream Apply 1 application topically 2 (two) times daily for hemorrhoids for 14 days. 12/09/21     hydrocortisone (ANUSOL-HC) 2.5 % rectal cream as needed. hemorrhoids 12/09/21   [provider]  hydrOXYzine (ATARAX/VISTARIL) 25 MG tablet Take 25 mg by mouth 3 (three) times daily as needed for anxiety or itching.  10/09/19   [provider]  isosorbide mononitrate (IMDUR) 30 MG 24 hr tablet Take 1 tablet (30 mg total) by mouth daily. 12/15/21   Chandrasekhar, Mahesh A, MD  ketoconazole (NIZORAL) 2 % cream Apply 1 application topically 2 (two) times daily for 14 days. 12/09/21     lidocaine (XYLOCAINE) 5 % ointment Apply 1 application topically as needed. 09/02/21   Sherrell Puller, PA-C  naproxen (NAPROSYN) 500 MG tablet Take 1 tablet (500 mg total) by mouth 2 (two) times daily. 1/65/53   Delora Fuel, MD  nitroGLYCERIN (NITROSTAT) 0.4 MG SL tablet PLACE 1 TABLET (0.4 MG TOTAL) UNDER THE TONGUE EVERY 5 (FIVE) MINUTES FOR 3 DOSES AS NEEDED FOR CHEST PAIN. 06/22/22   Chandrasekhar, Mahesh A, MD  oxyCODONE (ROXICODONE) 5 MG immediate release tablet Take 1 tablet (5 mg total) by mouth every 4 (four) hours as needed for severe pain. 7/48/27   Delora Fuel, MD   pantoprazole (PROTONIX) 20 MG tablet Take 1 tablet (20 mg total) by mouth daily. 08/25/22   Larose Kells, MD  polyethylene glycol (MIRALAX / GLYCOLAX) 17 g packet Take 17 g by mouth every other day.    [provider]  potassium chloride SA (KLOR-CON M) 20 MEQ tablet Take 1 tablet (20 mEq total) by mouth daily. 0/78/67   Delora Fuel, MD  potassium chloride SA (KLOR-CON) 20 MEQ tablet Take 20 mEq by mouth daily.    [provider]  predniSONE (DELTASONE) 50 MG tablet Take 1 tablet (50 mg total) by mouth daily. 5/44/92   Delora Fuel, MD  promethazine-dextromethorphan (PROMETHAZINE-DM) 6.25-15 MG/5ML syrup Take 5 mLs by mouth every 6 (six) hours if needed 02/08/22     rosuvastatin (CRESTOR) 40 MG tablet Take 1 tablet (40 mg total) by mouth daily. 02/28/22   Rudean Haskell  A, MD  senna (SENOKOT) 8.6 MG TABS tablet Take 1 tablet by mouth as needed.    [provider]  tiZANidine (ZANAFLEX) 4 MG tablet Take 1 tablet (4 mg total) by mouth every 6 (six) hours as needed for muscle spasms. 8/67/67   Delora Fuel, MD  triamcinolone cream (KENALOG) 0.1 % Apply 1 Application topically 2 (two) times daily. 07/12/22   Pattricia Boss, MD  valACYclovir (VALTREX) 1000 MG tablet as needed.    [provider]  potassium chloride (KLOR-CON) 10 MEQ tablet Take 1 tablet (10 mEq total) by mouth daily for 5 days. 04/14/22 06/02/22  Elgie Congo, MD                                                                                                                                    Allergies Pfizer-biontech Debbe Odea vacc Debbe Odea mrna vacc (moderna)], Chocolate, Ivp dye [iodinated contrast media], Latex, Shellfish allergy, Fish-derived products, Peanut-containing drug products, and Soap  Review of Systems Review of Systems  Constitutional:  Negative for diaphoresis and fatigue.  Respiratory:  Positive for shortness of breath. Negative for cough.   Cardiovascular:  Positive for  chest pain.  Gastrointestinal:  Negative for nausea and vomiting.  Neurological:  Negative for headaches.   As noted in HPI  Physical Exam Vital Signs  I have reviewed the triage vital signs BP 106/60   Pulse 76   Temp 98 F (36.7 C)   Resp 16   LMP 07/12/2016 (Approximate) Comment: 2-3 day cycle  SpO2 93%   Physical Exam Vitals reviewed.  Constitutional:      General: She is not in acute distress.    Appearance: She is well-developed. She is not diaphoretic.  HENT:     Head: Normocephalic and atraumatic.     Nose: Nose normal.  Eyes:     General: No scleral icterus.       Right eye: No discharge.        Left eye: No discharge.     Conjunctiva/sclera: Conjunctivae normal.     Pupils: Pupils are equal, round, and reactive to light.  Cardiovascular:     Rate and Rhythm: Normal rate and regular rhythm.     Heart sounds: No murmur heard.    No friction rub. No gallop.  Pulmonary:     Effort: Pulmonary effort is normal. No respiratory distress.     Breath sounds: Normal breath sounds. No stridor. No rales.  Abdominal:     General: There is no distension.     Palpations: Abdomen is soft.     Tenderness: There is no abdominal tenderness.  Musculoskeletal:        General: No tenderness.     Cervical back: Normal range of motion and neck supple.  Skin:    General: Skin is warm and dry.     Findings: No erythema or rash.  Neurological:  Mental Status: She is alert and oriented to person, place, and time.     ED Results and Treatments Labs (all labs ordered are listed, but only abnormal results are displayed) Labs Reviewed  COMPREHENSIVE METABOLIC PANEL - Abnormal; Notable for the following components:      Result Value   Potassium 3.3 (*)    Glucose, Bld 110 (*)    All other components within normal limits  CBC WITH DIFFERENTIAL/PLATELET  BRAIN NATRIURETIC PEPTIDE  TROPONIN I (HIGH SENSITIVITY)  TROPONIN I (HIGH SENSITIVITY)  TROPONIN I (HIGH SENSITIVITY)                                                                                                                          EKG  EKG Interpretation  Date/Time:  Friday October 06 2022 19:30:16 EST Ventricular Rate:  75 PR Interval:  164 QRS Duration: 76 QT Interval:  398 QTC Calculation: 444 R Axis:   -19 Text Interpretation: Normal sinus rhythm with sinus arrhythmia Possible Left atrial enlargement Nonspecific T wave abnormality Abnormal ECG When compared with ECG of 14-Apr-2022 15:34, PREVIOUS ECG IS PRESENT No significant change was found Confirmed by Addison Lank [id number]) on 10/07/2022 2:10:04 AM       Radiology DG Chest 2 View  Result Date: 10/06/2022 CLINICAL DATA:  Chest pain EXAM: CHEST - 2 VIEW COMPARISON:  04/14/2022 FINDINGS: No consolidation, pneumothorax, effusion or edema. Calcified aorta. Artifact from the patient's clothing IMPRESSION: Underinflation. Enlarged cardiopericardial silhouette. Prominent central vasculature. Electronically Signed   By: Jill Side M.D.   On: 10/06/2022 20:39    Medications Ordered in ED Medications - No data to display                                                                                                                                   Procedures Procedures  (including critical care time)  Medical Decision Making / ED Course   Medical Decision Making Amount and/or Complexity of Data Reviewed Labs: ordered. Decision-making details documented in ED Course. Radiology: ordered and independent interpretation performed. Decision-making details documented in ED Course. ECG/medicine tests: ordered and independent interpretation performed. Decision-making details documented in ED Course.  Risk Decision regarding hospitalization.    Patient presents with substernal chest pain that resolved with a single dose of nitroglycerin.  Presentation is most suspicious for angina pectoralis.  Will need to rule out ACS.  Will  also assess for other  etiologies including pneumonia, pneumothorax, heart failure though I have low suspicion for this.  Other possible etiologies could be GI related.  EKG without acute ischemic changes or evidence of pericarditis. Serial troponins negative x 3. BNP negative.  CBC without leukocytosis or anemia Metabolic panel with mild hypokalemia without other significant electrolyte derangements or renal sufficiency.  Chest x-ray without evidence of pneumonia, pneumothorax, pulmonary edema or pleural effusion.  I spoke with Dr. Humphrey Rolls from cardiology service, who agreed that patient would be stable for close cardiology follow-up in the office given her complete resolution with a single dose of nitroglycerin and reassuring workup today.      Final Clinical Impression(s) / ED Diagnoses Final diagnoses:  Angina pectoris ([place])  Coronary artery disease involving native coronary artery of native heart without angina pectoris   The patient appears reasonably screened and/or stabilized for discharge and I doubt any other medical condition or other [facility] requiring further screening, evaluation, or treatment in the ED at this time. I have discussed the findings, Dx and Tx plan with the patient/family who expressed understanding and agree(s) with the plan. Discharge instructions discussed at length. The patient/family was given strict return precautions who verbalized understanding of the instructions. No further questions at time of discharge.  Disposition: Discharge  Condition: Good  ED Discharge Orders          Ordered    Ambulatory referral to Cardiology        10/07/22 0559              Follow Up: Werner Lean, MD [place] [address] [phone]  Call  to schedule an appointment for close follow up  [place], [address], [place] [address] [phone]  Call  as needed           This chart was dictated using  voice recognition software.  Despite best efforts to proofread,  errors can occur which can change the documentation meaning.    Fatima Blank, MD 10/07/22 [phone]

## 2022-10-13 ENCOUNTER — Telehealth: Payer: Self-pay | Admitting: Internal Medicine

## 2022-10-13 NOTE — Telephone Encounter (Signed)
Spoke with pt who reports she developed CP and vomiting last night between 9 and 10pm that lasted until about 4am.. Pt states she finally took a nitro tablet that mostly resolved the pain.  She denies current CP.  BP 140/82.  She does complain of some SOB on exertion and at rest.  Pt is currently at work.  She is able to have a conversation without sounding SOB on the phone.  Pt was seen in the ED on 01/26.  BNP was normal with negative troponin's.  Pt is taking medication as prescribed.  Follow up appointment scheduled with Dr Gasper Sells for 10/16/2022.  Reviewed ED precautions and provided education on taking Nitro.  Pt verbalizes understanding and agrees with current plan.

## 2022-10-13 NOTE — Telephone Encounter (Signed)
Patient c/o Palpitations:  High priority if patient c/o lightheadedness, shortness of breath, or chest pain  How long have you had palpitations/irregular HR/ Afib? Are you having the symptoms now? Last night  Are you currently experiencing lightheadedness, SOB or CP? SOB  Do you have a history of afib (atrial fibrillation) or irregular heart rhythm? No   Have you checked your BP or HR? (document readings if available): 121/81; 140/82  Are you experiencing any other symptoms? Chest pain and vomiting

## 2022-10-16 ENCOUNTER — Encounter: Payer: Self-pay | Admitting: Internal Medicine

## 2022-10-16 ENCOUNTER — Other Ambulatory Visit (HOSPITAL_COMMUNITY): Payer: Self-pay

## 2022-10-16 ENCOUNTER — Ambulatory Visit: Payer: 59 | Attending: Internal Medicine | Admitting: Internal Medicine

## 2022-10-16 VITALS — BP 118/60 | HR 78 | Ht 59.0 in | Wt 209.0 lb

## 2022-10-16 DIAGNOSIS — I10 Essential (primary) hypertension: Secondary | ICD-10-CM

## 2022-10-16 DIAGNOSIS — I340 Nonrheumatic mitral (valve) insufficiency: Secondary | ICD-10-CM

## 2022-10-16 DIAGNOSIS — I2510 Atherosclerotic heart disease of native coronary artery without angina pectoris: Secondary | ICD-10-CM

## 2022-10-16 DIAGNOSIS — E782 Mixed hyperlipidemia: Secondary | ICD-10-CM | POA: Diagnosis not present

## 2022-10-16 DIAGNOSIS — I351 Nonrheumatic aortic (valve) insufficiency: Secondary | ICD-10-CM

## 2022-10-16 DIAGNOSIS — I493 Ventricular premature depolarization: Secondary | ICD-10-CM

## 2022-10-16 MED ORDER — ISOSORBIDE MONONITRATE ER 60 MG PO TB24
60.0000 mg | ORAL_TABLET | Freq: Every day | ORAL | 3 refills | Status: DC
  Filled 2022-10-16: qty 90, 90d supply, fill #0
  Filled 2023-02-02: qty 90, 90d supply, fill #1

## 2022-10-16 NOTE — Patient Instructions (Addendum)
Medication Instructions:  Your physician has recommended you make the following change in your medication:  INCREASE: isosorbide mononitrate (Imdur) to 60 mg by mouth once daily  *If you need a refill on your cardiac medications before your next appointment, please call your pharmacy*   Lab Work: NONE If you have labs (blood work) drawn today and your tests are completely normal, you will receive your results only by: [place] (if you have MyChart) OR A paper copy in the mail If you have any lab test that is abnormal or we need to change your treatment, we will call you to review the results.   Testing/Procedures: Your physician has requested that you have a Cardiac PET Scan.  Your physician has requested that you have an echocardiogram. Echocardiography is a painless test that uses sound waves to create images of your heart. It provides your doctor with information about the size and shape of your heart and how well your heart's chambers and valves are working. This procedure takes approximately one hour. There are no restrictions for this procedure. Please do NOT wear cologne, perfume, aftershave, or lotions (deodorant is allowed). Please arrive 15 minutes prior to your appointment time.    Follow-Up: At [facility], you and your health needs are our priority.  As part of our continuing mission to provide you with exceptional heart care, we have created designated Provider Care Teams.  These Care Teams include your primary Cardiologist (physician) and Advanced Practice Providers (APPs -  Physician Assistants and Nurse Practitioners) who all work together to provide you with the care you need, when you need it.   Your next appointment:   3-4 month(s)  Provider:   Werner Lean, MD     Other Instructions How to Prepare for Your Cardiac PET/CT Stress Test:  1. Please do not take these medications before your test:   Medications that may interfere with  the cardiac pharmacological stress agent (ex. nitrates - including erectile dysfunction medications, isosorbide mononitrate or beta-blockers) the day of the exam. (Erectile dysfunction medication should be held for at least 72 hrs prior to test) Theophylline containing medications for 12 hours. Dipyridamole 48 hours prior to the test. Your remaining medications may be taken with water.  2. Nothing to eat or drink, except water, 3 hours prior to arrival time.   NO caffeine/decaffeinated products, or chocolate 12 hours prior to arrival.  3. NO perfume, cologne or lotion  4. Total time is 1 to 2 hours; you may want to bring reading material for the waiting time.  5. Please report to Admitting at the [facility] Main Entrance 30 minutes early for your test.  [place], [place] [address]  Diabetic Preparation:  Hold oral medications. You may take NPH and Lantus insulin. Do not take Humalog or Humulin R (Regular Insulin) the day of your test. Check blood sugars prior to leaving the house. If able to eat breakfast prior to 3 hour fasting, you may take all medications, including your insulin, Do not worry if you miss your breakfast dose of insulin - start at your next meal.  IF YOU THINK YOU MAY BE PREGNANT, OR ARE NURSING PLEASE INFORM THE TECHNOLOGIST.  In preparation for your appointment, medication and supplies will be purchased.  Appointment availability is limited, so if you need to cancel or reschedule, please call the Radiology Department at [phone]  24 hours in advance to avoid a cancellation fee of $100.00  What  to Expect After you Arrive:  Once you arrive and check in for your appointment, you will be taken to a preparation room within the Radiology Department.  A technologist or Nurse will obtain your medical history, verify that you are correctly prepped for the exam, and explain the procedure.  Afterwards,  an IV will be started in your arm and  electrodes will be placed on your skin for EKG monitoring during the stress portion of the exam. Then you will be escorted to the PET/CT scanner.  There, staff will get you positioned on the scanner and obtain a blood pressure and EKG.  During the exam, you will continue to be connected to the EKG and blood pressure machines.  A small, safe amount of a radioactive tracer will be injected in your IV to obtain a series of pictures of your heart along with an injection of a stress agent.    After your Exam:  It is recommended that you eat a meal and drink a caffeinated beverage to counter act any effects of the stress agent.  Drink plenty of fluids for the remainder of the day and urinate frequently for the first couple of hours after the exam.  Your doctor will inform you of your test results within 7-10 business days.  For questions about your test or how to prepare for your test, please call: Marchia Bond, Cardiac Imaging Nurse Navigator  Gordy Clement, Cardiac Imaging Nurse Navigator Office: [phone]

## 2022-10-16 NOTE — Progress Notes (Signed)
.  Cardiology Office Note:    Date:  10/16/2022   ID:  Wendy [name], DOB 19-Nov-1965, MRN [id number]  PCP:  Johna Roles, PA  [facility] Cardiologist:  Werner Lean, MD  [facility] Electrophysiologist:  None   Referring MD: Johna Roles, PA   CC: Follow up visit  History of Present Illness:    Wendy [name] is a 57 y.o. female with a hx of CAD (NSTEMI 07/16/20 with mRCA DES, 50% pRCA; OM1 PCI, D1 50%) with preserved EF, HTN, HLD, Former Tobacco Use, GERD, who presented 07/15/20 for NSTEMI.  2022: Seen w/ PVCs and ZioPatch was placed.  Had some melena that has resolved. 2023: stopped zetia, needs summer lipids 2024: CP ED eval that resolved with nitro  Patient notes that she is doing better now.   Patient had recent ED visit.  She was unable to lay down because she was having chest pain.  Positional chest pain only.  Took one nitroglycerin and improved.BP was high at that time.  BP has since resolved.  CP has resolved.  Since October, has needed PRN nitro 3-4 times for chest pain syndrome.  Weight has been going up .  She notes that her stress is up, her exercise is down.  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 No SOB/DOE and no PND/Orthopnea.  Notes swelling in right leg.  She has limited exertion due to pain.  She hasn't really tried to exert her self.  No palpitations or syncope.  Feels tired and fatigued.      Past Medical History:  Diagnosis Date   Anxiety    Asthma    daily and prn inhalers   Cervical disc disease    decreased range of motion   Chronic pain    shoulders, neck, lower back   Coronary artery disease    Deformity, hand    left   Depression    Eczema    Endometriosis    Lupron injection Q 3 mos.   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   Urticaria    Wears partial dentures    upper partial    Past Surgical History:  Procedure Laterality Date   ANKLE RECONSTRUCTION  02/01/2012   Procedure: RECONSTRUCTION ANKLE;  Surgeon: Wylene Simmer, MD;  Location: [place];  Service: Orthopedics;  Laterality: Right;  Excision of right calcaneonavicular coalition with autologus fat graft interposition   BRACHIAL PLEXUS EXPLORATION     CARDIAC CATHETERIZATION     CORONARY STENT INTERVENTION N/A 07/15/2020   Procedure: CORONARY STENT INTERVENTION;  Surgeon: Jettie Booze, MD;  Location: [place] [facility];  Service: Cardiovascular;  Laterality: N/A;   DIAGNOSTIC LAPAROSCOPY  10/02/2008   peritoneal bx.   LEFT HEART CATH AND CORONARY ANGIOGRAPHY N/A 07/15/2020   Procedure: LEFT HEART CATH AND CORONARY ANGIOGRAPHY;  Surgeon: Jettie Booze, MD;  Location: [place] [facility];  Service: Cardiovascular;  Laterality: N/A;   MULTIPLE TOOTH EXTRACTIONS     upper teeth and wisdom teeth   OVARIAN CYST REMOVAL  2000   RIB RESECTION     left - cervical rib removal   SHOULDER ARTHROSCOPY W/ ROTATOR CUFF  REPAIR  07/2011   right   SHOULDER SURGERY     left   TOOTH EXTRACTION     x 1   Current Medications: Current Meds  Medication Sig   acetaminophen (TYLENOL) 325 MG tablet Take 2 tablets (650 mg total) by mouth every 6 (six) hours as needed.   albuterol (PROVENTIL HFA;VENTOLIN HFA) 108 (90 Base) MCG/ACT inhaler Inhale 2 puffs into the lungs every 4 (four) hours as needed for wheezing or shortness of breath (cough).   aspirin EC 81 MG EC tablet Take 1 tablet (81 mg total) by mouth daily. Swallow whole.   azelastine (ASTELIN) 0.1 % nasal spray Place 1 spray into both nostrils 2 (two) times daily. Use in each nostril as directed   benzonatate (TESSALON) 200 MG capsule as needed for cough.   budesonide-formoterol (SYMBICORT) 80-4.5 MCG/ACT inhaler Inhale 2 puffs into  the lungs daily.   cyclobenzaprine (FLEXERIL) 10 MG tablet Take 1 tablet (10 mg total) by mouth 2 (two) times daily as needed for muscle spasms.   diltiazem (CARTIA XT) 120 MG 24 hr capsule Take 1 capsule (120 mg total) by mouth daily.   EPINEPHrine (EPIPEN JR) 0.15 MG/0.3ML injection Inject 0.15 mg into the muscle daily as needed for anaphylaxis.    ergocalciferol (DRISDOL) 200 MCG/ML drops Take by mouth daily.   Evolocumab (REPATHA SURECLICK) 563 MG/ML SOAJ Inject 1 pen into the skin every 14 (fourteen) days.   fexofenadine (ALLEGRA ALLERGY) 180 MG tablet Take 1 tablet (180 mg total) by mouth daily. (Patient taking differently: Take 180 mg by mouth as needed for allergies.)   fluticasone (FLONASE) 50 MCG/ACT nasal spray Place 2 sprays into both nostrils daily.   fluticasone (FLOVENT HFA) 44 MCG/ACT inhaler as needed (shortness of breath).   furosemide (LASIX) 20 MG tablet Take 0.5 tablets (10 mg total) by mouth daily as needed for fluid retention or swelling   gabapentin (NEURONTIN) 600 MG tablet Take 600 mg by mouth at bedtime.   hydrocortisone (ANUSOL-HC) 2.5 % rectal cream as needed. hemorrhoids   hydrOXYzine (ATARAX/VISTARIL) 25 MG tablet Take 25 mg by mouth 3 (three) times daily as needed for anxiety or itching.    isosorbide mononitrate (IMDUR) 60 MG 24 hr tablet Take 1 tablet (60 mg total) by mouth daily.   ketoconazole (NIZORAL) 2 % cream Apply 1 application topically 2 (two) times daily for 14 days. (Patient taking differently: Apply 1 application  topically as needed for irritation.)   lidocaine (XYLOCAINE) 5 % ointment Apply 1 application topically as needed.   naproxen (NAPROSYN) 500 MG tablet Take 1 tablet (500 mg total) by mouth 2 (two) times daily. (Patient taking differently: Take 500 mg by mouth as needed for mild pain or moderate pain.)   nitroGLYCERIN (NITROSTAT) 0.4 MG SL tablet PLACE 1 TABLET (0.4 MG TOTAL) UNDER THE TONGUE EVERY 5 (FIVE) MINUTES FOR 3 DOSES AS NEEDED FOR  CHEST PAIN.   oxyCODONE (ROXICODONE) 5 MG immediate release tablet Take 1 tablet (5 mg total) by mouth every 4 (four) hours as needed for severe pain.   pantoprazole (PROTONIX) 20 MG tablet Take 1 tablet (20 mg total) by mouth daily.   polyethylene glycol (MIRALAX / GLYCOLAX) 17 g packet Take 17 g by mouth every other day.   potassium chloride SA (KLOR-CON M) 20 MEQ tablet Take 1 tablet (20 mEq total) by mouth daily.   rosuvastatin (CRESTOR) 40 MG tablet Take 1 tablet (40 mg total) by mouth daily.   senna (SENOKOT)  8.6 MG TABS tablet Take 1 tablet by mouth as needed.   tiZANidine (ZANAFLEX) 4 MG tablet Take 1 tablet (4 mg total) by mouth every 6 (six) hours as needed for muscle spasms.   triamcinolone cream (KENALOG) 0.1 % Apply 1 Application topically 2 (two) times daily.   valACYclovir (VALTREX) 1000 MG tablet as needed (cold sores).   [DISCONTINUED] isosorbide mononitrate (IMDUR) 30 MG 24 hr tablet Take 1 tablet (30 mg total) by mouth daily.     Allergies:   Pfizer-biontech covid-19 vacc [covid-19 mrna vacc (moderna)], Chocolate, Iodine, Ivp dye [iodinated contrast media], Latex, Peanut (diagnostic), Shellfish allergy, Soap & cleansers, Fish-derived products, Peanut-containing drug products, and Soap   Social History   Socioeconomic History   Marital status: Single    Spouse name: Not on file   Number of children: Not on file   Years of education: 14   Highest education level: Not on file  Occupational History   Not on file  Tobacco Use   Smoking status: Former    Packs/day: 1.00    Years: 20.00    Total pack years: 20.00    Types: Cigarettes    Quit date: 07/14/2020    Years since quitting: 2.2    Passive exposure: Never   Smokeless tobacco: Never  Vaping Use   Vaping Use: Never used  Substance and Sexual Activity   Alcohol use: No    Alcohol/week: 0.0 standard drinks of alcohol    Comment: occasional   Drug use: No   Sexual activity: Yes    Partners: Male    Birth  control/protection: None  Other Topics Concern   Not on file  Social History Narrative   Not on file   Social Determinants of Health   Financial Resource Strain: Not on file  Food Insecurity: Not on file  Transportation Needs: Not on file  Physical Activity: Not on file  Stress: Not on file  Social Connections: Not on file    Family History: The patient's family history includes Cancer in her mother; Dementia in her father; Stroke in her father.  ROS:   Please see the history of present illness.     All other systems reviewed and are negative.  EKGs/Labs/Other Studies Reviewed:    The following studies were reviewed today:  EKG:   2/5//24: Sinus 75 08/15/21: Sinus bradycardia with TWI 09/21/20: Sinus rhythm rate 61 with QTc 440 07/19/2020: Sinus Rhythm 84 QTc 508  Cardiac Studies & Procedures   CARDIAC CATHETERIZATION  CARDIAC CATHETERIZATION 07/15/2020  Narrative  Prox RCA lesion is 50% stenosed. Vasospasm noted on this lesion which improved with IC NTG.  Mid RCA lesion is 99% stenosed. This was the culprit lesion which was ulcerated.  A drug-eluting stent was successfully placed using a STENT RESOLUTE ONYX 3.0X15, postdilated to 3.25 mm.  Post intervention, there is a 0% residual stenosis.  1st Mrg lesion is 80% stenosed.  A drug-eluting stent was successfully placed using a STENT RESOLUTE ONYX 2.5X18, postdilated to 3.0 mm.  Post intervention, there is a 0% residual stenosis.  Prox Cx lesion is 25% stenosed.  1st Diag lesion is 50% stenosed.  Prox LAD lesion is 25% stenosed.  The left ventricular systolic function is normal.  LV end diastolic pressure is moderately elevated.  The left ventricular ejection fraction is 55-65% by visual estimate.  There is no aortic valve stenosis.  She needs aggressive risk factor modification.  DAPT with healthy, plant based diet, high dose statin.  Findings Coronary Findings Diagnostic  Dominance: Right  Left  Anterior Descending Prox LAD lesion is 25% stenosed.  First Diagonal Branch 1st Diag lesion is 50% stenosed.  Left Circumflex Prox Cx lesion is 25% stenosed.  First Obtuse Marginal Branch Vessel is large in size. 1st Mrg lesion is 80% stenosed.  Right Coronary Artery Prox RCA lesion is 50% stenosed. Mid RCA lesion is 99% stenosed. The lesion is type C and dissected.  Intervention  1st Mrg lesion Stent CATH LAUNCHER 6FR EBU 3.75 guide catheter was inserted. Lesion crossed with guidewire using a WIRE ASAHI PROWATER 180CM. Pre-stent angioplasty was performed using a BALLOON SAPPHIRE 2.5X15. A drug-eluting stent was successfully placed using a STENT RESOLUTE ONYX 2.5X18. Stent strut is well apposed. Post-stent angioplasty was performed using a BALLOON SAPPHIRE [place] 3.0X15. Post-Intervention Lesion Assessment The intervention was successful. Pre-interventional TIMI flow is 3. Post-intervention TIMI flow is 3. No complications occurred at this lesion. There is a 0% residual stenosis post intervention.  Mid RCA lesion Stent CATH LAUNCHER 6FR JR4 guide catheter was inserted. Lesion crossed with guidewire using a WIRE ASAHI PROWATER 180CM. Pre-stent angioplasty was performed using a BALLOON SAPPHIRE 2.5X15. A drug-eluting stent was successfully placed using a STENT RESOLUTE ONYX 3.0X15. Stent strut is well apposed. Post-stent angioplasty was performed using a BALLOON SAPPHIRE [place] 3.25X12. Post-Intervention Lesion Assessment The intervention was successful. Pre-interventional TIMI flow is 3. Post-intervention TIMI flow is 3. No complications occurred at this lesion. There is a 0% residual stenosis post intervention.     ECHOCARDIOGRAM  ECHOCARDIOGRAM COMPLETE 03/03/2022  Narrative ECHOCARDIOGRAM REPORT    Patient Name:   Wendy [name] Date of Exam: 03/03/2022 Medical Rec #:  [id number]        Height:       59.0 in Accession #:    [id number]       Weight:       207.0 lb Date of Birth:   12/14/1965        BSA:          1.872 m Patient Age:    3 years         BP:           98/56 mmHg Patient Gender: F                HR:           88 bpm. Exam Location:  [address]  Procedure: 2D Echo, Cardiac Doppler, Color Doppler and 3D Echo  Indications:    Mitral Regurgitation I34.0; Aortic Insufficiency I35.1  History:        Patient has prior history of Echocardiogram examinations, most recent 02/24/2021. CAD; Risk Factors:Hypertension and Dyslipidemia.  Sonographer:    Mikki Santee RDCS Referring Phys: [id number] [facility] A Cristopher Ciccarelli  IMPRESSIONS   1. Left ventricular ejection fraction, by estimation, is 60 to 65%. The left ventricle has normal function. The left ventricle has no regional wall motion abnormalities. Left ventricular diastolic parameters are consistent with Grade I diastolic dysfunction (impaired relaxation). 2. Right ventricular systolic function is normal. The right ventricular size is normal. There is normal pulmonary artery systolic pressure. 3. The mitral valve is normal in structure. Mild mitral valve regurgitation. No evidence of mitral stenosis. 4. The aortic valve is tricuspid. Aortic valve regurgitation is mild to moderate. No aortic stenosis is present. 5. The inferior vena cava is normal in size with greater than 50% respiratory variability, suggesting right atrial pressure of 3 mmHg.  FINDINGS Left  Ventricle: Left ventricular ejection fraction, by estimation, is 60 to 65%. The left ventricle has normal function. The left ventricle has no regional wall motion abnormalities. The left ventricular internal cavity size was normal in size. There is no left ventricular hypertrophy. Left ventricular diastolic parameters are consistent with Grade I diastolic dysfunction (impaired relaxation).  Right Ventricle: The right ventricular size is normal. No increase in right ventricular wall thickness. Right ventricular systolic function is normal. There is  normal pulmonary artery systolic pressure. The tricuspid regurgitant velocity is 2.02 m/s, and with an assumed right atrial pressure of 3 mmHg, the estimated right ventricular systolic pressure is 54.6 mmHg.  Left Atrium: Left atrial size was normal in size.  Right Atrium: Right atrial size was normal in size.  Pericardium: There is no evidence of pericardial effusion.  Mitral Valve: The mitral valve is normal in structure. Mild mitral valve regurgitation. No evidence of mitral valve stenosis.  Tricuspid Valve: The tricuspid valve is normal in structure. Tricuspid valve regurgitation is trivial. No evidence of tricuspid stenosis.  Aortic Valve: The aortic valve is tricuspid. Aortic valve regurgitation is mild to moderate. Aortic regurgitation PHT measures 473 msec. No aortic stenosis is present.  Pulmonic Valve: The pulmonic valve was normal in structure. Pulmonic valve regurgitation is not visualized. No evidence of pulmonic stenosis.  Aorta: The aortic root is normal in size and structure. Ascending aorta measurements are within normal limits for age when indexed to body surface area.  Venous: The inferior vena cava is normal in size with greater than 50% respiratory variability, suggesting right atrial pressure of 3 mmHg.  IAS/Shunts: No atrial level shunt detected by color flow Doppler.   LEFT VENTRICLE PLAX 2D LVIDd:         5.20 cm   Diastology LVIDs:         3.50 cm   LV e' medial:    6.76 cm/s LV PW:         0.90 cm   LV E/e' medial:  11.6 LV IVS:        0.90 cm   LV e' lateral:   6.67 cm/s LVOT diam:     2.00 cm   LV E/e' lateral: 11.7 LV SV:         78 LV SV Index:   41 LVOT Area:     3.14 cm  3D Volume EF: 3D EF:        64 % LV EDV:       138 ml LV ESV:       50 ml LV SV:        88 ml  RIGHT VENTRICLE RV Basal diam:  3.60 cm RV Mid diam:    2.50 cm RV S prime:     14.10 cm/s TAPSE (M-mode): 1.9 cm  LEFT ATRIUM             Index        RIGHT ATRIUM            Index LA diam:        3.60 cm 1.92 cm/m   RA Area:     13.10 cm LA Vol (A2C):   46.0 ml 24.58 ml/m  RA Volume:   28.90 ml  15.44 ml/m LA Vol (A4C):   44.3 ml 23.67 ml/m LA Biplane Vol: 46.7 ml 24.95 ml/m AORTIC VALVE LVOT Vmax:   114.00 cm/s LVOT Vmean:  75.100 cm/s LVOT VTI:    0.247 m AI PHT:  473 msec  AORTA Ao Root diam: 2.80 cm Ao Asc diam:  3.10 cm  MITRAL VALVE               TRICUSPID VALVE MV Area (PHT): 4.63 cm    TR Peak grad:   16.3 mmHg MV Decel Time: 164 msec    TR Vmax:        202.00 cm/s MR Peak grad: 120.1 mmHg MR Mean grad: 82.0 mmHg    SHUNTS MR Vmax:      548.00 cm/s  Systemic VTI:  0.25 m MR Vmean:     431.0 cm/s   Systemic Diam: 2.00 cm MV E velocity: 78.10 cm/s MV A velocity: 87.80 cm/s MV E/A ratio:  0.89  Glori Bickers MD Electronically signed by Glori Bickers MD Signature Date/Time: 03/03/2022/6:18:11 PM    Final    MONITORS  LONG TERM MONITOR (3-14 DAYS) 10/14/2020  Narrative  Patient had a minimum heart rate of 49 bpm, maximum heart rate of 174 bpm, and average heart rate of 76 bpm.  Predominant underlying rhythm was sinus rhythm.  One run of supraventricular tachycardia occurred lasting 4 beats at longest with a max rate of 174 bpm at fastest.  Isolated PACs were rare (<1.0%), with rare couplets present.  Isolated PVCs were rare (<1.0%).  No evidence of complete heart block .  Triggered and diary events associated with sinus rhythm.  No malignant arrhythmias.            Recent Labs: 04/14/2022: Magnesium 1.9 10/06/2022: ALT 14; BUN 8; Creatinine, Ser 0.78; Hemoglobin 12.7; Platelets 251; Potassium 3.3; Sodium 139 10/07/2022: B Natriuretic Peptide 29.4  Recent Lipid Panel    Component Value Date/Time   CHOL 131 03/03/2022 0912   TRIG 40 03/03/2022 0912   HDL 86 03/03/2022 0912   CHOLHDL 1.5 03/03/2022 0912   CHOLHDL 3.1 07/15/2020 1730   VLDL 9 07/15/2020 1730   LDLCALC 35 03/03/2022 0912     Physical  Exam:    VS:  BP 118/60   Pulse 78   Ht '4\' 11"'$  (1.499 m)   Wt 209 lb (94.8 kg)   LMP 07/12/2016 (Approximate) Comment: 2-3 day cycle  SpO2 98%   BMI 42.21 kg/m     Wt Readings from Last 3 Encounters:  10/16/22 209 lb (94.8 kg)  08/29/22 205 lb 9.6 oz (93.3 kg)  08/25/22 203 lb 14.4 oz (92.5 kg)    Gen: no distress, morbid obesity   Neck: No JVD Cardiac: No Rubs or Gallops, systolic and diastolic murmur, bradycardia, +2 radial pulses Respiratory: Clear to auscultation bilaterally, normal effort, normal  respiratory rate GI: Soft, nontender, non-distended  MS: No  edema;  moves all extremities Integument: Skin feels warm Neuro:  At time of evaluation, alert and oriented to person/place/time/situation  Psych: Normal affect, patient feels well  ASSESSMENT:    1. Coronary artery disease involving native coronary artery of native heart without angina pectoris   2. Mixed hyperlipidemia   3. Essential hypertension   4. PVC (premature ventricular contraction)   5. Nonrheumatic mitral valve regurgitation   6. Nonrheumatic aortic valve insufficiency     PLAN:    Coronary Artery Disease; Obstructive and possible vasospasm component HLD HTN PVCs - symptomatic - anatomy: 50% pRCA, mRCA PCI, OM1 PCI, pLCX 25%, D1 50%, pLAD 25% - continue ASA 81 mg - continue statin, goal LDL < 55;  continue repatha - Gven potentially having a vasospastic component, keep CCB  - continue PRN  nitro and increase Imdur 60 mg - PET MPI - we have discussed the role of stress as it relates to her CAD - we discussed the role of stress as it related to CAD  Mitral Valve Regurgitation (mild) AI (Mild to moderate) - Echo in 2023; will get f/u in May of this year - for now continue PRN lasix, if needing more will trial daily    Three months me or APP   Medication Adjustments/Labs and Tests Ordered: Current medicines are reviewed at length with the patient today.  Concerns regarding medicines are  outlined above.  Orders Placed This Encounter  Procedures   NM PET CT CARDIAC PERFUSION MULTI W/ABSOLUTE BLOODFLOW   Cardiac Stress Test: Informed Consent Details: Physician/Practitioner Attestation; Transcribe to consent form and obtain patient signature    Meds ordered this encounter  Medications   isosorbide mononitrate (IMDUR) 60 MG 24 hr tablet    Sig: Take 1 tablet (60 mg total) by mouth daily.    Dispense:  90 tablet    Refill:  3     Patient Instructions  Medication Instructions:  Your physician has recommended you make the following change in your medication:  INCREASE: isosorbide mononitrate (Imdur) to 60 mg by mouth once daily  *If you need a refill on your cardiac medications before your next appointment, please call your pharmacy*   Lab Work: NONE If you have labs (blood work) drawn today and your tests are completely normal, you will receive your results only by: [facility] (if you have MyChart) OR A paper copy in the mail If you have any lab test that is abnormal or we need to change your treatment, we will call you to review the results.   Testing/Procedures: Your physician has requested that you have a Cardiac PET Scan.  Your physician has requested that you have an echocardiogram. Echocardiography is a painless test that uses sound waves to create images of your heart. It provides your doctor with information about the size and shape of your heart and how well your heart's chambers and valves are working. This procedure takes approximately one hour. There are no restrictions for this procedure. Please do NOT wear cologne, perfume, aftershave, or lotions (deodorant is allowed). Please arrive 15 minutes prior to your appointment time.    Follow-Up: At [facility], you and your health needs are our priority.  As part of our continuing mission to provide you with exceptional heart care, we have created designated Provider Care Teams.  These Care  Teams include your primary Cardiologist (physician) and Advanced Practice Providers (APPs -  Physician Assistants and Nurse Practitioners) who all work together to provide you with the care you need, when you need it.   Your next appointment:   3-4 month(s)  Provider:   Werner Lean, MD     Other Instructions How to Prepare for Your Cardiac PET/CT Stress Test:  1. Please do not take these medications before your test:   Medications that may interfere with the cardiac pharmacological stress agent (ex. nitrates - including erectile dysfunction medications, isosorbide mononitrate or beta-blockers) the day of the exam. (Erectile dysfunction medication should be held for at least 72 hrs prior to test) Theophylline containing medications for 12 hours. Dipyridamole 48 hours prior to the test. Your remaining medications may be taken with water.  2. Nothing to eat or drink, except water, 3 hours prior to arrival time.   NO caffeine/decaffeinated products, or chocolate 12 hours prior to  arrival.  3. NO perfume, cologne or lotion  4. Total time is 1 to 2 hours; you may want to bring reading material for the waiting time.  5. Please report to Admitting at the [facility] Main Entrance 30 minutes early for your test.  [place], [place] [id number]  Diabetic Preparation:  Hold oral medications. You may take NPH and Lantus insulin. Do not take Humalog or Humulin R (Regular Insulin) the day of your test. Check blood sugars prior to leaving the house. If able to eat breakfast prior to 3 hour fasting, you may take all medications, including your insulin, Do not worry if you miss your breakfast dose of insulin - start at your next meal.  IF YOU THINK YOU MAY BE PREGNANT, OR ARE NURSING PLEASE INFORM THE TECHNOLOGIST.  In preparation for your appointment, medication and supplies will be purchased.  Appointment availability is limited, so if you need to cancel or  reschedule, please call the Radiology Department at [phone]  24 hours in advance to avoid a cancellation fee of $100.00  What to Expect After you Arrive:  Once you arrive and check in for your appointment, you will be taken to a preparation room within the Radiology Department.  A technologist or Nurse will obtain your medical history, verify that you are correctly prepped for the exam, and explain the procedure.  Afterwards,  an IV will be started in your arm and electrodes will be placed on your skin for EKG monitoring during the stress portion of the exam. Then you will be escorted to the PET/CT scanner.  There, staff will get you positioned on the scanner and obtain a blood pressure and EKG.  During the exam, you will continue to be connected to the EKG and blood pressure machines.  A small, safe amount of a radioactive tracer will be injected in your IV to obtain a series of pictures of your heart along with an injection of a stress agent.    After your Exam:  It is recommended that you eat a meal and drink a caffeinated beverage to counter act any effects of the stress agent.  Drink plenty of fluids for the remainder of the day and urinate frequently for the first couple of hours after the exam.  Your doctor will inform you of your test results within 7-10 business days.  For questions about your test or how to prepare for your test, please call: Marchia Bond, Cardiac Imaging Nurse Navigator  Gordy Clement, Cardiac Imaging Nurse Navigator Office: [phone]     Signed, Werner Lean, MD  10/16/2022 9:56 AM    [place]

## 2022-10-17 ENCOUNTER — Ambulatory Visit: Payer: Medicare Other | Admitting: Internal Medicine

## 2022-10-24 ENCOUNTER — Other Ambulatory Visit: Payer: Self-pay

## 2022-10-24 ENCOUNTER — Ambulatory Visit (INDEPENDENT_AMBULATORY_CARE_PROVIDER_SITE_OTHER): Payer: 59 | Admitting: Internal Medicine

## 2022-10-24 ENCOUNTER — Encounter: Payer: Self-pay | Admitting: Internal Medicine

## 2022-10-24 VITALS — BP 130/64 | HR 85 | Temp 98.0°F | Ht 59.0 in | Wt 209.7 lb

## 2022-10-24 DIAGNOSIS — J310 Chronic rhinitis: Secondary | ICD-10-CM

## 2022-10-24 DIAGNOSIS — K219 Gastro-esophageal reflux disease without esophagitis: Secondary | ICD-10-CM

## 2022-10-24 DIAGNOSIS — L501 Idiopathic urticaria: Secondary | ICD-10-CM | POA: Diagnosis not present

## 2022-10-24 MED ORDER — FEXOFENADINE HCL 180 MG PO TABS: 180.0000 mg | ORAL_TABLET | ORAL | 5 refills | Status: DC | PRN

## 2022-10-24 MED ORDER — AZELASTINE HCL 0.1 % NA SOLN: 1.0000 | Freq: Two times a day (BID) | NASAL | 5 refills | Status: DC

## 2022-10-24 MED ORDER — FLUTICASONE PROPIONATE 50 MCG/ACT NA SUSP: 2.0000 | Freq: Every day | NASAL | 5 refills | Status: DC

## 2022-10-24 NOTE — Progress Notes (Signed)
FOLLOW UP Date of Service/Encounter:  10/24/22   Subjective:  Wendy [name] (DOB: 04-12-1966) is a 57 y.o. female who returns to the [facility] [place] on 10/24/2022 for follow up for idiopathic urticaria/angioedema, chronic rhinitis, GERD.    History obtained from: chart review and patient. Last visit was with me 08/29/2022 for skin testing and she did not have any reactions on SPT or ID.  Since last visit, she reports having some congestion and runny nose but it is improved.  She is taking Flonase daily or every other day but has not started the Azelastine PRN. She also takes Allegra 111m daily. No ocular symptoms.   No episodes of hives or swelling.  She does delayed swelling at the site of testing at last visit.   Her reflux is doing well.  She has not had much symptoms.  Takes Protonix 243mevery other day and this keeps her controlled.   Past Medical History: Past Medical History:  Diagnosis Date   Anxiety    Asthma    daily and prn inhalers   Cervical disc disease    decreased range of motion   Chronic pain    shoulders, neck, lower back   Coronary artery disease    Deformity, hand    left   Depression    Eczema    Endometriosis    Lupron injection Q 3 mos.   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 Physical Exam: GEN: alert, well developed HEENT: clear conjunctiva, TM grey and translucent, nose with moderate inferior turbinate hypertrophy, pink nasal mucosa,  clear rhinorrhea, no cobblestoning HEART: regular rate and rhythm, no murmur LUNGS: clear to auscultation bilaterally, no coughing, unlabored respiration SKIN: no rashes or lesions   Assessment:   No diagnosis found.  Plan/Recommendations:  Chronic Rhinitis: - Improved with addition of INCS; discussed PRN INAH if still uncontrolled - Positive skin test 08/2022: none - Use nasal saline rinses before nose sprays such as with Neilmed Sinus Rinse.  Use distilled water.   - Use Flonase 2 sprays each nostril daily. Aim upward and outward. - Use Azelastine 1-2 sprays each nostril twice daily as needed. Aim upward and outward.  GERD: -Continue Protonix 20109mvery other day or daily depending on symptoms.  -Avoid lying down for at least two hours after a meal or after drinking acidic beverages, like soda, or other caffeinated beverages. This can help to prevent stomach contents from flowing back into the esophagus. -Keep your head elevated while you sleep. Using an extra pillow or two can also help to prevent reflux. -Eat smaller and more frequent meals each day instead of a few large meals. This promotes digestion and can aid in preventing heartburn. -Wear loose-fitting clothes to ease pressure on the stomach, which can worsen heartburn and reflux. -Reduce excess weight  around the midsection. This can ease pressure on the stomach. Such pressure can force some stomach contents back up the esophagus.   Idiopathic Urticaria (Hives) - No further recurrence with starting daily Allegra - At this time etiology of hives and swelling is unknown. Hives can be caused by a variety of different triggers including illness/infection, exercise, pressure, vibrations, extremes of temperature to name a few however majority of the time there is no identifiable trigger.  -If hives become frequent or are uncontrolled on anti-histamines, we will consider lab work.  -Continue Allegra 176m daily.   -If no improvement  in 1 week, increase to Allegra 1853mtwice daily.   -If no improvement in 1 week, add Pepcid 2053mwice daily and continue Allegra 180m33mice daily.     Return in about 6 months (around 04/24/2023).  [facility]

## 2022-10-24 NOTE — Patient Instructions (Addendum)
Chronic Rhinitis: - Positive skin test 08/2022: none - Use nasal saline rinses before nose sprays such as with Neilmed Sinus Rinse.  Use distilled water.   - Use Flonase 2 sprays each nostril daily. Aim upward and outward. - Use Azelastine 1-2 sprays each nostril twice daily as needed. Aim upward and outward.  GERD: -Continue Protonix 92m every other day or daily depending on symptoms.  -Avoid lying down for at least two hours after a meal or after drinking acidic beverages, like soda, or other caffeinated beverages. This can help to prevent stomach contents from flowing back into the esophagus. -Keep your head elevated while you sleep. Using an extra pillow or two can also help to prevent reflux. -Eat smaller and more frequent meals each day instead of a few large meals. This promotes digestion and can aid in preventing heartburn. -Wear loose-fitting clothes to ease pressure on the stomach, which can worsen heartburn and reflux. -Reduce excess weight around the midsection. This can ease pressure on the stomach. Such pressure can force some stomach contents back up the esophagus.   Idiopathic Urticaria (Hives) - At this time etiology of hives and swelling is unknown. Hives can be caused by a variety of different triggers including illness/infection, exercise, pressure, vibrations, extremes of temperature to name a few however majority of the time there is no identifiable trigger.  -If hives become frequent or are uncontrolled on anti-histamines, we will consider lab work.  -Continue Allegra 1836mdaily.   -If no improvement in 1 week, increase to Allegra 18048mwice daily.   -If no improvement in 1 week, add Pepcid 91m81mice daily and continue Allegra 180mg6mce daily.

## 2022-11-07 ENCOUNTER — Telehealth (HOSPITAL_COMMUNITY): Payer: Self-pay | Admitting: Emergency Medicine

## 2022-11-07 ENCOUNTER — Other Ambulatory Visit (HOSPITAL_COMMUNITY): Payer: 59

## 2022-11-07 NOTE — Telephone Encounter (Signed)
Unable to leave vm  Left SMS notification Marchia Bond RN Navigator [facility] [phone] Office  [phone] Cell

## 2022-11-08 ENCOUNTER — Encounter (HOSPITAL_COMMUNITY)
Admission: RE | Admit: 2022-11-08 | Discharge: 2022-11-08 | Disposition: A | Discharge status: Home / Self Care | Payer: 59 | Source: Ambulatory Visit | Attending: Internal Medicine | Admitting: Internal Medicine

## 2022-11-08 DIAGNOSIS — I10 Essential (primary) hypertension: Secondary | ICD-10-CM | POA: Insufficient documentation

## 2022-11-08 DIAGNOSIS — I2510 Atherosclerotic heart disease of native coronary artery without angina pectoris: Secondary | ICD-10-CM | POA: Diagnosis present

## 2022-11-08 DIAGNOSIS — E782 Mixed hyperlipidemia: Secondary | ICD-10-CM | POA: Insufficient documentation

## 2022-11-08 DIAGNOSIS — I493 Ventricular premature depolarization: Secondary | ICD-10-CM | POA: Diagnosis not present

## 2022-11-08 DIAGNOSIS — I340 Nonrheumatic mitral (valve) insufficiency: Secondary | ICD-10-CM | POA: Insufficient documentation

## 2022-11-08 DIAGNOSIS — I351 Nonrheumatic aortic (valve) insufficiency: Secondary | ICD-10-CM | POA: Diagnosis not present

## 2022-11-08 LAB — NM PET CT CARDIAC PERFUSION MULTI W/ABSOLUTE BLOODFLOW
LV dias vol: 120 mL (ref 46–106)
LV sys vol: 46 mL
MBFR: 1.77
Nuc Rest EF: 60 %
Nuc Stress EF: 62 %
Peak HR: 107 {beats}/min
Rest HR: 93 {beats}/min
Rest MBF: 1.33 ml/g/min
Rest Nuclear Isotope Dose: 24.5 mCi
ST Depression (mm): 0 mm
Stress MBF: 2.36 ml/g/min
Stress Nuclear Isotope Dose: 24.5 mCi
TID: 1.06

## 2022-11-08 MED ORDER — RUBIDIUM RB82 GENERATOR (RUBYFILL)
24.5000 | PACK | Freq: Once | INTRAVENOUS | Status: AC
  Administered 2022-11-08: 24.5 via INTRAVENOUS

## 2022-11-08 MED ORDER — REGADENOSON 0.4 MG/5ML IV SOLN
INTRAVENOUS | Status: AC
  Filled 2022-11-08: qty 5

## 2022-11-14 ENCOUNTER — Other Ambulatory Visit (HOSPITAL_COMMUNITY): Payer: Self-pay

## 2022-11-14 ENCOUNTER — Other Ambulatory Visit: Payer: Self-pay | Admitting: Internal Medicine

## 2022-11-14 DIAGNOSIS — H68009 Unspecified Eustachian salpingitis, unspecified ear: Secondary | ICD-10-CM | POA: Diagnosis not present

## 2022-11-14 DIAGNOSIS — H6690 Otitis media, unspecified, unspecified ear: Secondary | ICD-10-CM | POA: Diagnosis not present

## 2022-11-14 DIAGNOSIS — E782 Mixed hyperlipidemia: Secondary | ICD-10-CM

## 2022-11-14 DIAGNOSIS — I2510 Atherosclerotic heart disease of native coronary artery without angina pectoris: Secondary | ICD-10-CM

## 2022-11-14 MED ORDER — AMOXICILLIN-POT CLAVULANATE 875-125 MG PO TABS
1.0000 | ORAL_TABLET | Freq: Two times a day (BID) | ORAL | 0 refills | Status: DC
  Filled 2022-11-14 – 2022-11-15 (×2): qty 14, 7d supply, fill #0

## 2022-11-14 MED ORDER — PREDNISONE 10 MG PO TABS
ORAL_TABLET | ORAL | 0 refills | Status: DC
  Filled 2022-11-14: qty 21, 6d supply, fill #0

## 2022-11-14 MED ORDER — REPATHA SURECLICK 140 MG/ML ~~LOC~~ SOAJ
SUBCUTANEOUS | 3 refills | Status: DC
  Filled 2022-11-14: qty 6, 84d supply, fill #0
  Filled 2023-01-18: qty 6, 84d supply, fill #1
  Filled 2023-05-09: qty 6, 84d supply, fill #2
  Filled 2023-08-20: qty 6, 84d supply, fill #3

## 2022-11-15 ENCOUNTER — Other Ambulatory Visit (HOSPITAL_COMMUNITY): Payer: Self-pay

## 2022-11-15 DIAGNOSIS — K625 Hemorrhage of anus and rectum: Secondary | ICD-10-CM | POA: Diagnosis not present

## 2022-11-15 DIAGNOSIS — Z86010 Personal history of colonic polyps: Secondary | ICD-10-CM | POA: Diagnosis not present

## 2022-11-15 DIAGNOSIS — I2510 Atherosclerotic heart disease of native coronary artery without angina pectoris: Secondary | ICD-10-CM | POA: Diagnosis not present

## 2022-11-19 ENCOUNTER — Emergency Department (HOSPITAL_COMMUNITY): Payer: 59

## 2022-11-19 ENCOUNTER — Emergency Department (HOSPITAL_COMMUNITY)
Admission: EM | Admit: 2022-11-19 | Discharge: 2022-11-20 | Disposition: A | Discharge status: Home / Self Care | Payer: 59 | Attending: Emergency Medicine | Admitting: Emergency Medicine

## 2022-11-19 ENCOUNTER — Encounter (HOSPITAL_COMMUNITY): Payer: Self-pay

## 2022-11-19 DIAGNOSIS — Z91010 Allergy to peanuts: Secondary | ICD-10-CM | POA: Insufficient documentation

## 2022-11-19 DIAGNOSIS — E876 Hypokalemia: Secondary | ICD-10-CM | POA: Diagnosis not present

## 2022-11-19 DIAGNOSIS — J45909 Unspecified asthma, uncomplicated: Secondary | ICD-10-CM | POA: Diagnosis not present

## 2022-11-19 DIAGNOSIS — Z7982 Long term (current) use of aspirin: Secondary | ICD-10-CM | POA: Diagnosis not present

## 2022-11-19 DIAGNOSIS — I2510 Atherosclerotic heart disease of native coronary artery without angina pectoris: Secondary | ICD-10-CM | POA: Diagnosis not present

## 2022-11-19 DIAGNOSIS — R079 Chest pain, unspecified: Secondary | ICD-10-CM | POA: Diagnosis not present

## 2022-11-19 DIAGNOSIS — R0789 Other chest pain: Secondary | ICD-10-CM | POA: Insufficient documentation

## 2022-11-19 DIAGNOSIS — Z87891 Personal history of nicotine dependence: Secondary | ICD-10-CM | POA: Insufficient documentation

## 2022-11-19 DIAGNOSIS — I10 Essential (primary) hypertension: Secondary | ICD-10-CM | POA: Insufficient documentation

## 2022-11-19 DIAGNOSIS — D649 Anemia, unspecified: Secondary | ICD-10-CM | POA: Diagnosis not present

## 2022-11-19 DIAGNOSIS — H6992 Unspecified Eustachian tube disorder, left ear: Secondary | ICD-10-CM

## 2022-11-19 DIAGNOSIS — Z91040 Latex allergy status: Secondary | ICD-10-CM | POA: Diagnosis not present

## 2022-11-19 DIAGNOSIS — Z7951 Long term (current) use of inhaled steroids: Secondary | ICD-10-CM | POA: Diagnosis not present

## 2022-11-19 NOTE — ED Triage Notes (Signed)
Patient stating she has been treated for an ear infection over the last week. Reports having dizziness, shortness of breath, and left sided chest pain.

## 2022-11-20 ENCOUNTER — Other Ambulatory Visit: Payer: Self-pay

## 2022-11-20 ENCOUNTER — Other Ambulatory Visit: Payer: Self-pay | Admitting: Gastroenterology

## 2022-11-20 DIAGNOSIS — R0789 Other chest pain: Secondary | ICD-10-CM | POA: Diagnosis not present

## 2022-11-20 DIAGNOSIS — R079 Chest pain, unspecified: Secondary | ICD-10-CM | POA: Diagnosis not present

## 2022-11-20 LAB — BASIC METABOLIC PANEL
Anion gap: 5 (ref 5–15)
BUN: 12 mg/dL (ref 6–20)
CO2: 25 mmol/L (ref 22–32)
Calcium: 8.3 mg/dL — ABNORMAL LOW (ref 8.9–10.3)
Chloride: 106 mmol/L (ref 98–111)
Creatinine, Ser: 0.64 mg/dL (ref 0.44–1.00)
GFR, Estimated: >60 mL/min (ref >60)
Glucose, Bld: 129 mg/dL — ABNORMAL HIGH (ref 70–99)
Potassium: 3.1 mmol/L — ABNORMAL LOW (ref 3.5–5.1)
Sodium: 136 mmol/L (ref 135–145)

## 2022-11-20 LAB — CBC
HCT: 37.4 % (ref 36.0–46.0)
Hemoglobin: 11.6 g/dL — ABNORMAL LOW (ref 12.0–15.0)
MCH: 27.6 pg (ref 26.0–34.0)
MCHC: 31 g/dL (ref 30.0–36.0)
MCV: 88.8 fL (ref 80.0–100.0)
Platelets: 270 10*3/uL (ref 150–400)
RBC: 4.21 MIL/uL (ref 3.87–5.11)
RDW: 12.9 % (ref 11.5–15.5)
WBC: 9.5 10*3/uL (ref 4.0–10.5)
nRBC: 0 % (ref 0.0–0.2)

## 2022-11-20 LAB — TROPONIN I (HIGH SENSITIVITY)
Troponin I (High Sensitivity): 3 ng/L (ref <18)
Troponin I (High Sensitivity): 3 ng/L (ref <18)

## 2022-11-20 MED ORDER — POTASSIUM CHLORIDE CRYS ER 20 MEQ PO TBCR
40.0000 meq | EXTENDED_RELEASE_TABLET | Freq: Once | ORAL | Status: AC
  Administered 2022-11-20: 40 meq via ORAL
  Filled 2022-11-20: qty 2

## 2022-11-20 MED ORDER — ACETAMINOPHEN 325 MG PO TABS
650.0000 mg | ORAL_TABLET | Freq: Once | ORAL | Status: AC
  Administered 2022-11-20: 650 mg via ORAL
  Filled 2022-11-20: qty 2

## 2022-11-20 MED ORDER — KETOROLAC TROMETHAMINE 30 MG/ML IJ SOLN
30.0000 mg | Freq: Once | INTRAMUSCULAR | Status: AC
  Administered 2022-11-20: 30 mg via INTRAVENOUS
  Filled 2022-11-20: qty 1

## 2022-11-20 MED ORDER — POTASSIUM CHLORIDE CRYS ER 20 MEQ PO TBCR: EXTENDED_RELEASE_TABLET | ORAL | 0 refills | Status: DC

## 2022-11-20 NOTE — ED Provider Notes (Signed)
EMERGENCY DEPARTMENT AT [facility] Provider Note   CSN: [id number] Arrival date & time: 11/19/22  2332     History  Chief Complaint  Patient presents with   Chest Pain    Wendy [name] is a 57 y.o. female.  The history is provided by the patient.  Chest Pain She has history of hypertension, hyperlipidemia, coronary artery disease, asthma and comes in because of feeling like there is water in your ears and also complaining of some chest pain.  She states that about 10 days ago, she had a right upper molar extracted.  About 2-3 days later, she started feeling like there was water in your ears and that water was draining from her ears.  She then started having a pulling feeling in her chest.  She denies dyspnea, nausea, diaphoresis.  Symptoms are not exertional.  She did take 1 dose of nitroglycerin which seem to give some slight, temporary relief.  She has been on an antibiotic, but states that it is not helping her.  She is a former smoker.   Home Medications Prior to Admission medications   Medication Sig Start Date End Date Taking? Authorizing Provider  potassium chloride SA (KLOR-CON M) 20 MEQ tablet Take one tablet twice a day for ten days, then one tablet daily [id number]  Yes Delora Fuel, MD  acetaminophen (TYLENOL) 325 MG tablet Take 2 tablets (650 mg total) by mouth every 6 (six) hours as needed. 10/01/20   Mesner, Corene Cornea, MD  albuterol (PROVENTIL HFA;VENTOLIN HFA) 108 (90 Base) MCG/ACT inhaler Inhale 2 puffs into the lungs every 4 (four) hours as needed for wheezing or shortness of breath (cough). 11/20/16   Street, Mercedes, PA-C  amoxicillin-clavulanate (AUGMENTIN) 875-125 MG tablet Take 1 tablet by mouth every 12 (twelve) hours for 7 days 11/14/22     aspirin EC 81 MG EC tablet Take 1 tablet (81 mg total) by mouth daily. Swallow whole. 07/17/20   Bhagat, Crista Luria, PA  azelastine (ASTELIN) 0.1 % nasal spray Place 1 spray into both nostrils 2 (two) times  daily. Use in each nostril as directed 10/24/22   Larose Kells, MD  benzonatate (TESSALON) 200 MG capsule as needed for cough.    [provider]  budesonide-formoterol (SYMBICORT) 80-4.5 MCG/ACT inhaler Inhale 2 puffs into the lungs daily. 02/15/22     cyclobenzaprine (FLEXERIL) 10 MG tablet Take 1 tablet (10 mg total) by mouth 2 (two) times daily as needed for muscle spasms. 10/01/20   Mesner, Corene Cornea, MD  diltiazem (CARTIA XT) 120 MG 24 hr capsule Take 1 capsule (120 mg total) by mouth daily. 02/01/22   Chandrasekhar, Lyda Kalata A, MD  EPINEPHrine (EPIPEN JR) 0.15 MG/0.3ML injection Inject 0.15 mg into the muscle daily as needed for anaphylaxis.     [provider]  ergocalciferol (DRISDOL) 200 MCG/ML drops Take by mouth daily.    [provider]  Evolocumab (REPATHA SURECLICK) [id number] MG/ML SOAJ Inject 1 pen into the skin every 14 (fourteen) days. 11/14/22   Werner Lean, MD  fexofenadine (ALLEGRA ALLERGY) 180 MG tablet Take 1 tablet (180 mg total) by mouth as needed for allergies (hives). 10/24/22   Larose Kells, MD  fluticasone (FLONASE) 50 MCG/ACT nasal spray Place 2 sprays into both nostrils daily. 10/24/22   Larose Kells, MD  fluticasone (FLOVENT HFA) 44 MCG/ACT inhaler as needed (shortness of breath). 02/03/21   [provider]  furosemide (LASIX) 20 MG tablet Take 0.5 tablets (10 mg  total) by mouth daily as needed for fluid retention or swelling 12/06/21   Chandrasekhar, Mahesh A, MD  gabapentin (NEURONTIN) 600 MG tablet Take 600 mg by mouth at bedtime.    [provider]  hydrocortisone (ANUSOL-HC) 2.5 % rectal cream as needed. hemorrhoids 12/09/21   [provider]  hydrOXYzine (ATARAX/VISTARIL) 25 MG tablet Take 25 mg by mouth 3 (three) times daily as needed for anxiety or itching.  10/09/19   [provider]  isosorbide mononitrate (IMDUR) 60 MG 24 hr tablet Take 1 tablet (60 mg total) by mouth daily. 10/16/22   Chandrasekhar,  Terisa Starr, MD  ketoconazole (NIZORAL) 2 % cream Apply 1 application topically 2 (two) times daily for 14 days. Patient taking differently: Apply 1 application  topically as needed for irritation. 12/09/21     lidocaine (XYLOCAINE) 5 % ointment Apply 1 application topically as needed. 09/02/21   Sherrell Puller, PA-C  naproxen (NAPROSYN) 500 MG tablet Take 1 tablet (500 mg total) by mouth 2 (two) times daily. Patient taking differently: Take 500 mg by mouth as needed for mild pain or moderate pain. [id number]   Delora Fuel, MD  nitroGLYCERIN (NITROSTAT) 0.4 MG SL tablet PLACE 1 TABLET (0.4 MG TOTAL) UNDER THE TONGUE EVERY 5 (FIVE) MINUTES FOR 3 DOSES AS NEEDED FOR CHEST PAIN. 06/22/22   Chandrasekhar, Mahesh A, MD  oxyCODONE (ROXICODONE) 5 MG immediate release tablet Take 1 tablet (5 mg total) by mouth every 4 (four) hours as needed for severe pain. [id number]   Delora Fuel, MD  pantoprazole (PROTONIX) 20 MG tablet Take 1 tablet (20 mg total) by mouth daily. 08/25/22   Larose Kells, MD  polyethylene glycol (MIRALAX / GLYCOLAX) 17 g packet Take 17 g by mouth every other day.    [provider]  rosuvastatin (CRESTOR) 40 MG tablet Take 1 tablet (40 mg total) by mouth daily. 02/28/22   Werner Lean, MD  senna (SENOKOT) 8.6 MG TABS tablet Take 1 tablet by mouth as needed.    [provider]  tiZANidine (ZANAFLEX) 4 MG tablet Take 1 tablet (4 mg total) by mouth every 6 (six) hours as needed for muscle spasms. [id number]   Delora Fuel, MD  triamcinolone cream (KENALOG) 0.1 % Apply 1 Application topically 2 (two) times daily. 07/12/22   Pattricia Boss, MD  valACYclovir (VALTREX) 1000 MG tablet as needed (cold sores).    [provider]  potassium chloride (KLOR-CON) 10 MEQ tablet Take 1 tablet (10 mEq total) by mouth daily for 5 days. 04/14/22 06/02/22  Elgie Congo, MD      Allergies    Pfizer-biontech Debbe Odea vacc Debbe Odea mrna vacc (moderna)], Chocolate, Iodine, Ivp dye  [iodinated contrast media], Latex, Peanut (diagnostic), Shellfish allergy, Soap & cleansers, Fish-derived products, Peanut-containing drug products, and Soap    Review of Systems   Review of Systems  Cardiovascular:  Positive for chest pain.  All other systems reviewed and are negative.   Physical Exam Updated Vital Signs BP 125/66   Pulse 66   Temp 97.6 F (36.4 C) (Oral)   Resp 19   Ht '4\' 11"'$  (1.499 m)   Wt 95.1 kg   LMP 07/12/2016 (Approximate) Comment: 2-3 day cycle  SpO2 94%   BMI 42.35 kg/m  Physical Exam Vitals and nursing note reviewed.   57 year old female, resting comfortably and in no acute distress. Vital signs are normal. Oxygen saturation is 94%, which is normal. Head is normocephalic and atraumatic. PERRLA, EOMI.  Oropharynx is clear.  Tympanic membranes are clear.  Extraction site appears dry and healing appropriately. Neck is nontender and supple without adenopathy or JVD. Back is nontender and there is no CVA tenderness. Lungs are clear without rales, wheezes, or rhonchi. Chest is moderately tender in the parasternal area bilaterally.  There is no crepitus. Heart has regular rate and rhythm without murmur. Abdomen is soft, flat, nontender. Extremities have no cyanosis or edema, full range of motion is present. Skin is warm and dry without rash. Neurologic: Mental status is normal, cranial nerves are intact.  Moves all extremities equally.  ED Results / Procedures / Treatments   Labs (all labs ordered are listed, but only abnormal results are displayed) Labs Reviewed  BASIC METABOLIC PANEL - Abnormal; Notable for the following components:      Result Value   Potassium 3.1 (*)    Glucose, Bld 129 (*)    Calcium 8.3 (*)    All other components within normal limits  CBC - Abnormal; Notable for the following components:   Hemoglobin 11.6 (*)    All other components within normal limits  TROPONIN I (HIGH SENSITIVITY)  TROPONIN I (HIGH SENSITIVITY)     EKG EKG Interpretation  Date/Time:  Monday November 20 2022 00:00:02 EDT Ventricular Rate:  79 PR Interval:  154 QRS Duration: 86 QT Interval:  404 QTC Calculation: 464 R Axis:   5 Text Interpretation: Sinus rhythm Probable left atrial enlargement Borderline T abnormalities, anterior leads When compared with ECG of 10/06/2022, No significant change was found Confirmed by Delora Fuel ([id number]) on 11/20/2022 12:06:49 AM  Radiology DG Chest 2 View  Result Date: 11/20/2022 CLINICAL DATA:  chest pain EXAM: CHEST - 2 VIEW COMPARISON:  Chest x-ray 10/06/2022 FINDINGS: The heart and mediastinal contours are unchanged. Aortic calcification. No focal consolidation. No pulmonary edema. No pleural effusion. No pneumothorax. No acute osseous abnormality. IMPRESSION: 1. No active cardiopulmonary disease. 2.  Aortic Atherosclerosis (ICD10-I70.0). Electronically Signed   By: Iven Finn M.D.   On: 11/20/2022 00:44    Procedures Procedures  Cardiac monitor shows sinus rhythm with occasional PVC, per my interpretation.  Medications Ordered in ED Medications  ketorolac (TORADOL) 30 MG/ML injection 30 mg (has no administration in time range)  acetaminophen (TYLENOL) tablet 650 mg (has no administration in time range)  potassium chloride SA (KLOR-CON M) CR tablet 40 mEq (has no administration in time range)    ED Course/ Medical Decision Making/ A&P                             Medical Decision Making Amount and/or Complexity of Data Reviewed Labs: ordered. Radiology: ordered.  Risk OTC drugs. Prescription drug management.   Sense of fluid in your ears, probable eustachian tube dysfunction.  No evidence of otitis media on my exam.  Chest pain which appears to be chest wall pain.  I have low index of suspicion for ACS.  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  Cardiac catheterization on 07/15/2020 showed a 99% lesion in the mid right coronary artery and an 80% lesion in the first marginal artery both of which were treated with stents.  No other lesions greater than 50% at that time.  I have reviewed and interpreted her electrocardiogram and my  interpretation is borderline T wave flattening unchanged from prior.  Chest x-ray shows no active cardiopulmonary disease.  I have independently viewed the images, and agree with the radiologist's interpretation.  I have reviewed and interpreted her laboratory tests and my interpretation is hypokalemia which is likely secondary to diuretic use and has been present on multiple prior occasions, mild anemia which is new compared with 10/06/2022, elevated random glucose, normal troponin with repeat troponin pending.  Heart score is 3 which puts her at low risk for major adverse cardiac events in the next 6 weeks.  I have ordered a dose of ketorolac and acetaminophen, plan on short trial of nasal decongestants and ENT referral for her eustachian tube dysfunction.  She feels much better following above-noted treatment.  Repeat troponin is normal.  No evidence of acute cardiac injury.  I am discharging her with instructions to apply ice and use over-the-counter NSAIDs and acetaminophen as needed for pain.  Use nasal decongestants for the next 2 days, follow-up with ENT.  Risk of rhinitis medicamentosa with nasal decongestant use greater than 3 days was stressed.  Final Clinical Impression(s) / ED Diagnoses Final diagnoses:  Nonspecific chest pain  Eustachian tube dysfunction, left  Diuretic-induced hypokalemia    Rx / DC Orders ED Discharge Orders          Ordered    potassium chloride SA (KLOR-CON M) 20 MEQ tablet        11/20/22 [id number]              Delora Fuel, MD [id number] [phone]

## 2022-11-20 NOTE — Discharge Instructions (Addendum)
Use Afrin or Neosynephrine nose spray for the next two days. However, do not use them after the two days are up or your symptoms will start getting worse.  Apply ice to the sore area in your chest.  Ice to be applied for 30 minutes at a time, 4 times a day.  You may also take an anti-inflammatory medication such as ibuprofen or naproxen as needed for pain.  To get additional pain relief, add acetaminophen.  Return if you have any new or concerning symptoms.

## 2022-11-21 ENCOUNTER — Other Ambulatory Visit (HOSPITAL_COMMUNITY): Payer: Self-pay

## 2022-11-27 ENCOUNTER — Encounter: Payer: Self-pay | Admitting: Internal Medicine

## 2022-11-30 ENCOUNTER — Other Ambulatory Visit (HOSPITAL_COMMUNITY): Payer: Self-pay

## 2022-11-30 DIAGNOSIS — J439 Emphysema, unspecified: Secondary | ICD-10-CM | POA: Diagnosis not present

## 2022-11-30 DIAGNOSIS — H6690 Otitis media, unspecified, unspecified ear: Secondary | ICD-10-CM | POA: Diagnosis not present

## 2022-11-30 DIAGNOSIS — I25119 Atherosclerotic heart disease of native coronary artery with unspecified angina pectoris: Secondary | ICD-10-CM | POA: Diagnosis not present

## 2022-11-30 MED ORDER — BUSPIRONE HCL 5 MG PO TABS
5.0000 mg | ORAL_TABLET | Freq: Two times a day (BID) | ORAL | 0 refills | Status: DC
  Filled 2022-11-30: qty 60, 30d supply, fill #0

## 2022-11-30 MED ORDER — ALBUTEROL SULFATE HFA 108 (90 BASE) MCG/ACT IN AERS
2.0000 | INHALATION_SPRAY | RESPIRATORY_TRACT | 3 refills | Status: DC
  Filled 2022-11-30: qty 6.7, 30d supply, fill #0

## 2022-12-18 ENCOUNTER — Other Ambulatory Visit (HOSPITAL_COMMUNITY): Payer: Self-pay

## 2022-12-19 ENCOUNTER — Other Ambulatory Visit: Payer: Self-pay

## 2022-12-19 ENCOUNTER — Other Ambulatory Visit (HOSPITAL_COMMUNITY): Payer: Self-pay

## 2022-12-25 ENCOUNTER — Institutional Professional Consult (permissible substitution): Payer: 59 | Admitting: Internal Medicine

## 2023-01-02 ENCOUNTER — Ambulatory Visit
Admission: RE | Admit: 2023-01-02 | Discharge: 2023-01-02 | Disposition: A | Discharge status: Home / Self Care | Payer: 59 | Source: Ambulatory Visit | Attending: Internal Medicine | Admitting: Internal Medicine

## 2023-01-02 ENCOUNTER — Other Ambulatory Visit: Payer: Self-pay | Admitting: Internal Medicine

## 2023-01-02 ENCOUNTER — Other Ambulatory Visit (HOSPITAL_COMMUNITY): Payer: Self-pay

## 2023-01-02 DIAGNOSIS — M25362 Other instability, left knee: Secondary | ICD-10-CM | POA: Diagnosis not present

## 2023-01-02 DIAGNOSIS — J439 Emphysema, unspecified: Secondary | ICD-10-CM | POA: Diagnosis not present

## 2023-01-02 DIAGNOSIS — R0602 Shortness of breath: Secondary | ICD-10-CM | POA: Diagnosis not present

## 2023-01-02 MED ORDER — ALBUTEROL SULFATE HFA 108 (90 BASE) MCG/ACT IN AERS
2.0000 | INHALATION_SPRAY | RESPIRATORY_TRACT | 3 refills | Status: DC | PRN
  Filled 2023-01-02: qty 6.7, 25d supply, fill #0

## 2023-01-02 MED ORDER — BUSPIRONE HCL 5 MG PO TABS
5.0000 mg | ORAL_TABLET | Freq: Two times a day (BID) | ORAL | 5 refills | Status: AC
  Filled 2023-01-02 – 2023-02-02 (×2): qty 60, 30d supply, fill #0

## 2023-01-04 DIAGNOSIS — M1712 Unilateral primary osteoarthritis, left knee: Secondary | ICD-10-CM | POA: Diagnosis not present

## 2023-01-04 DIAGNOSIS — M25562 Pain in left knee: Secondary | ICD-10-CM | POA: Diagnosis not present

## 2023-01-12 ENCOUNTER — Encounter: Payer: Self-pay | Admitting: Internal Medicine

## 2023-01-12 ENCOUNTER — Ambulatory Visit (INDEPENDENT_AMBULATORY_CARE_PROVIDER_SITE_OTHER): Payer: 59 | Admitting: Internal Medicine

## 2023-01-12 VITALS — BP 120/74 | HR 78 | Temp 97.8°F | Ht 59.0 in | Wt 212.2 lb

## 2023-01-12 DIAGNOSIS — Z122 Encounter for screening for malignant neoplasm of respiratory organs: Secondary | ICD-10-CM | POA: Diagnosis not present

## 2023-01-12 DIAGNOSIS — J4489 Other specified chronic obstructive pulmonary disease: Secondary | ICD-10-CM

## 2023-01-12 DIAGNOSIS — J439 Emphysema, unspecified: Secondary | ICD-10-CM

## 2023-01-12 DIAGNOSIS — R0602 Shortness of breath: Secondary | ICD-10-CM | POA: Diagnosis not present

## 2023-01-12 DIAGNOSIS — G4733 Obstructive sleep apnea (adult) (pediatric): Secondary | ICD-10-CM | POA: Diagnosis not present

## 2023-01-12 NOTE — Patient Instructions (Addendum)
Please schedule follow up scheduled with myself in 3 months.  If my schedule is not open yet, we will contact you with a reminder closer to that time. Please call [phone] if you haven't heard from [place] a month before.   Continue the symbicort 2 puffs twice a day. Gargle after use. Continue albuterol inhaler as needed up to 4 times a day.   Before your next visit I would like you to have: PFT - one hour, prior to next visit Home sleep test  CT Chest for lung cancer screening - we will schedule this and call you.   Understanding COPD   What is COPD? COPD stands for chronic obstructive pulmonary (lung) disease. COPD is a general term used for several lung diseases.  COPD is an umbrella term and encompasses other  common diseases in this group like chronic bronchitis and emphysema. Chronic asthma may also be included in this group. While some patients with COPD have only chronic bronchitis or emphysema, most patients have a combination of both.  You might hear these terms used in exchange for one another.   COPD adds to the work of the heart. Diseased lungs may reduce the amount of oxygen that goes to the blood. High blood pressure in blood vessels from the heart to the lungs makes it difficult for the heart to pump. Lung disease can also cause the body to produce too many red blood cells which may make the blood thicker and harder to pump.   Patients who have COPD with low oxygen levels may develop an enlarged heart (cor pulmonale). This condition weakens the heart and causes increased shortness of breath and swelling in the legs and feet.   Chronic bronchitis Chronic bronchitis is irritation and inflammation (swelling) of the lining in the bronchial tubes (air passages). The irritation causes coughing and an excess amount of mucus in the airways. The swelling makes it difficult to get air in and out of the lungs. The small, hair-like structures on the inside of the airways (called cilia) may  be damaged by the irritation. The cilia are then unable to help clean mucus from the airways.  Bronchitis is generally considered to be chronic when you have: a productive cough (cough up mucus) and shortness of breath that lasts about 3 months or more each year for 2 or more years in a row. Your doctor may define chronic bronchitis differently.   Emphysema Emphysema is the destruction, or breakdown, of the walls of the alveoli (air sacs) located at the end of the bronchial tubes. The damaged alveoli are not able to exchange oxygen and carbon dioxide between the lungs and the blood. The bronchioles lose their elasticity and collapse when you exhale, trapping air in the lungs. The trapped air keeps fresh air and oxygen from entering the lungs.   Who is affected by COPD? Emphysema and chronic bronchitis affect approximately 16 million people in the [place], or close to 11 percent of the population.   Symptoms of COPD  Shortness of breath  Shortness of breath with mild exercise (walking, using the stairs, etc.)  Chronic, productive cough (with mucus)  A feeling of "tightness" in the chest  Wheezing   What causes COPD? The two primary causes of COPD are cigarette smoking and alpha1-antitrypsin (AAT) deficiency. Air pollution and occupational dusts may also contribute to COPD, especially when the person exposed to these substances is a cigarette smoker.  Cigarette smoke causes COPD by irritating the airways and  creating inflammation that narrows the airways, making it more difficult to breathe. Cigarette smoke also causes the cilia to stop working properly so mucus and trapped particles are not cleaned from the airways. As a result, chronic cough and excess mucus production develop, leading to chronic bronchitis.  In some people, chronic bronchitis and infections can lead to destruction of the small airways, or emphysema.  AAT deficiency, an inherited disorder, can also lead to emphysema. Alpha  antitrypsin (AAT) is a protective material produced in the liver and transported to the lungs to help combat inflammation. When there is not enough of the chemical AAT, the body is no longer protected from an enzyme in the white blood cells.   How is COPD diagnosed?  To diagnose COPD, the physician needs to know: Do you smoke?  Have you had chronic exposure to dust or air pollutants?  Do other members of your family have lung disease?  Are you short of breath?  Do you get short of breath with exercise?  Do you have chronic cough and/or wheezing?  Do you cough up excess mucus?  To help with the diagnosis, the physician will conduct a thorough physical exam which includes:  Listening to your lungs and heart  Checking your blood pressure and pulse  Examining your nose and throat  Checking your feet and ankles for swelling   Laboratory and other tests Several laboratory and other tests are needed to confirm a diagnosis of COPD. These tests may include:  Chest X-ray to look for lung changes that could be caused by COPD   Spirometry and pulmonary function tests (PFTs) to determine lung volume and air flow  Pulse oximetry to measure the saturation of oxygen in the blood  Arterial blood gases (ABGs) to determine the amount of oxygen and carbon dioxide in the blood  Exercise testing to determine if the oxygen level in the blood drops during exercise   Treatment In the beginning stages of COPD, there is minimal shortness of breath that may be noticed only during exercise. As the disease progresses, shortness of breath may worsen and you may need to wear an oxygen device.   To help control other symptoms of COPD, the following treatments and lifestyle changes may be prescribed.  Quitting smoking  Avoiding cigarette smoke and other irritants  Taking medications including: a. bronchodilators b. anti-inflammatory agents c. oxygen d. antibiotics  Maintaining a healthy diet  Following a  structured exercise program such as pulmonary rehabilitation Preventing respiratory infections  Controlling stress   If your COPD progresses, you may be eligible to be evaluated for lung volume reduction surgery or lung transplantation. You may also be eligible to participate in certain clinical trials (research studies). Ask your health care providers about studies being conducted in your hospital.   What is the outlook? Although COPD can not be cured, its symptoms can be treated and your quality of life can be improved. Your prognosis or outlook for the future will depend on how well your lungs are functioning, your symptoms, and how well you respond to and follow your treatment plan.

## 2023-01-12 NOTE — Progress Notes (Signed)
Wendy [name]    [id number]    [birth year]  Primary Care Physician:Clelland, Marlaine Hind, PA  Referring Physician: Delma Officer, PA [address] [place],  [place] [address] Reason for Consultation: :"emphysema" Date of Consultation: 01/12/2023  Chief complaint:   Chief Complaint  Patient presents with   Consult    SOB, CHF, COPD.  Referred by Dr Cheryll Cockayne.     HPI: Wendy [name] is a 57 y.o. woman with past medical history of tobacco use disorder who presents for new patient evaluation for shortness of breath.Symptoms started 4-5 months ago and seem to be progressively worsening. Dyspnea with minimal exertion such as walking and going up stairs.  Denies any inciting infection. She has gained weight over this time frame as well.   She has occasional cough, dry. Worse with exertion.She has wheezing and chest tightness. She has an albuterol inhaler as well as symbicort. Has been on these for a long time but only recently started taking them. She does feel they improve her breathing. She is scared about taking inhalers because she watched her mom take inhalers. Takes albuterol about twice a day for shortness of breath.   She had childhood bronchitis that she grew out of. No hospitalizations for breathing. No pneumonia.   She has le edema and takes lasix. She notes palpitations when she is trying to sleep. She has trouble laying flat to sleep. She does snore. She does have apneic events. Daytime sleepiness, does fall asleep without meaning to. This is happening more frequently. Had sleep test 5-6 years ago.   She has seasonal allergies and takes claritin daily. She sees Allergy and asthma for this. She is also prone to hives.   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  Smoking history: 25 pack years, quit 2021  Social History   Occupational History   Not  on file  Tobacco Use   Smoking status: Former    Packs/day: 1.00    Years: 25.00    Additional pack years: 0.00    Total pack years: 25.00    Types: Cigarettes    Quit date: 07/14/2020    Years since quitting: 2.4    Passive exposure: Never   Smokeless tobacco: Never  Vaping Use   Vaping Use: Never used  Substance and Sexual Activity   Alcohol use: No    Alcohol/week: 0.0 standard drinks of alcohol    Comment: occasional   Drug use: No   Sexual activity: Yes    Partners: Male    Birth control/protection: None    Relevant family history:  Family History  Problem Relation Age of Onset   Heart failure Mother    Cancer Mother    COPD Mother    Dementia Father    Stroke Father    CAD Father    Heart failure Sister     Past Medical History:  Diagnosis Date   Anxiety    Asthma    daily and prn inhalers   Cervical disc disease    decreased range of motion   Chronic pain    shoulders, neck, lower back   Coronary artery disease    Deformity, hand    left   Depression    Eczema    Endometriosis    Lupron injection Q 3 mos.   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   Urticaria    Wears partial dentures    upper partial    Past Surgical History:  Procedure Laterality Date   ANKLE RECONSTRUCTION  02/01/2012   Procedure: RECONSTRUCTION ANKLE;  Surgeon: Toni Arthurs, MD;  Location: [facility];  Service: Orthopedics;  Laterality: Right;  Excision of right calcaneonavicular coalition with autologus fat graft interposition   BRACHIAL PLEXUS EXPLORATION     CARDIAC CATHETERIZATION     CORONARY STENT INTERVENTION N/A 07/15/2020   Procedure: CORONARY STENT INTERVENTION;  Surgeon: Corky Crafts, MD;  Location: [facility]AS[facility];  Service:  Cardiovascular;  Laterality: N/A;   DIAGNOSTIC LAPAROSCOPY  10/02/2008   peritoneal bx.   LEFT HEART CATH AND CORONARY ANGIOGRAPHY N/A 07/15/2020   Procedure: LEFT HEART CATH AND CORONARY ANGIOGRAPHY;  Surgeon: Corky Crafts, MD;  Location: [facility] INVASIVE CV LAB;  Service: Cardiovascular;  Laterality: N/A;   MULTIPLE TOOTH EXTRACTIONS     upper teeth and wisdom teeth   OVARIAN CYST REMOVAL  2000   RIB RESECTION     left - cervical rib removal   SHOULDER ARTHROSCOPY W/ ROTATOR CUFF REPAIR  07/2011   right   SHOULDER SURGERY     left   TOOTH EXTRACTION     x 1     Physical Exam: Blood pressure 120/74, pulse 78, temperature 97.8 F (36.6 C), temperature source Oral, height 4\' 11"  (1.499 m), weight 212 lb 3.2 oz (96.3 kg), last menstrual period 07/12/2016, SpO2 99 %. Gen:      No acute distress ENT:  mallampati IV, no nasal polyps, mucus membranes moist Lungs:    No increased respiratory effort, symmetric chest wall excursion, clear to auscultation bilaterally, no wheezes or crackles CV:         Regular rate and rhythm; systolic murmur,  trace pedal edema Abd:      + bowel sounds; soft, non-tender; no distension MSK: no acute synovitis of DIP or PIP joints, no mechanics hands.  Skin:      Warm and dry; no rashes Neuro: normal speech, no focal facial asymmetry Psych: alert and oriented x3, normal mood and affect   Data Reviewed/Medical Decision Making:  Independent interpretation of tests: Imaging:  Review of patient's LDCT Chest March 2023 images revealed mild centrilobular emphysema. The patient's images have been independently reviewed by me.    PFTs:  Labs:  Lab Results  Component Value Date   NA 136 11/20/2022   K 3.1 (L) 11/20/2022   CO2 25 11/20/2022   GLUCOSE 129 (H) 11/20/2022   BUN 12 11/20/2022   CREATININE 0.64 11/20/2022   CALCIUM 8.3 (L) 11/20/2022   EGFR 92 01/24/2021   GFRNONAA >60 11/20/2022   Lab Results  Component Value Date   WBC 9.5 11/20/2022    HGB 11.6 (L) 11/20/2022   HCT 37.4 11/20/2022   MCV 88.8 11/20/2022   PLT 270 11/20/2022      Immunization status:  Immunization History  Administered Date(s) Administered   Influenza,inj,Quad PF,6+ Mos 06/15/2014   MMR 04/15/2002   Td 02/24/2002     I reviewed prior external note(s) from PCP  I reviewed the result(s) of the labs and imaging as noted above.   I have ordered  pft, sleep study   Assessment:  Shortness of breath likely multifactorial from: COPD, severity unknown Mild AI Obesity Body mass index is 42.86 kg/m. OSA suspected Need for lung cancer screening History of tobacco use disorder  Plan/Recommendations:  Continue the symbicort 2 puffs twice a day. Gargle after use. Continue albuterol inhaler as needed up to 4 times a day.   Before your next visit I would like you to have: PFT - one hour, prior to next visit Home sleep test  CT Chest for lung cancer screening - we will schedule this and call you.  We discussed disease management and progression at length today.   Return to Care: Return in about 3 months (around 04/14/2023) for next available PFT, follow up after in 3 months.  Durel Salts, MD Pulmonary and Critical Care Medicine [facility] Office:[phone]  CC: [place], [place], [place]

## 2023-01-15 ENCOUNTER — Other Ambulatory Visit (HOSPITAL_COMMUNITY): Payer: Self-pay

## 2023-01-16 ENCOUNTER — Ambulatory Visit: Payer: 59 | Attending: Internal Medicine | Admitting: Internal Medicine

## 2023-01-16 ENCOUNTER — Encounter: Payer: Self-pay | Admitting: Internal Medicine

## 2023-01-16 VITALS — BP 112/68 | HR 77 | Ht 59.0 in | Wt 211.0 lb

## 2023-01-16 DIAGNOSIS — I340 Nonrheumatic mitral (valve) insufficiency: Secondary | ICD-10-CM

## 2023-01-16 DIAGNOSIS — I493 Ventricular premature depolarization: Secondary | ICD-10-CM | POA: Diagnosis not present

## 2023-01-16 DIAGNOSIS — I351 Nonrheumatic aortic (valve) insufficiency: Secondary | ICD-10-CM

## 2023-01-16 DIAGNOSIS — I2510 Atherosclerotic heart disease of native coronary artery without angina pectoris: Secondary | ICD-10-CM | POA: Diagnosis not present

## 2023-01-16 DIAGNOSIS — I10 Essential (primary) hypertension: Secondary | ICD-10-CM

## 2023-01-16 DIAGNOSIS — R0609 Other forms of dyspnea: Secondary | ICD-10-CM | POA: Diagnosis not present

## 2023-01-16 NOTE — Progress Notes (Signed)
.  Cardiology Office Note:    Date:  01/16/2023   ID:  Wendy [name], DOB 03-Jan-1966, MRN [id number]  PCP:  Delma Officer, PA  [facility] Cardiologist:  Christell Constant, MD  [facility] Electrophysiologist:  None   Referring MD: Delma Officer, PA   CC: Follow up prior to coloscopy  History of Present Illness:    Wendy [name] is a 57 y.o. female with a hx of CAD (NSTEMI 07/16/20 with mRCA DES, 50% pRCA; OM1 PCI, D1 50%) with preserved EF, HTN, HLD, Former Tobacco Use, GERD, who presented 07/15/20 for NSTEMI.  2022: Seen w/ PVCs and ZioPatch was placed.  Had some melena that has resolved. 2023: stopped zetia, needs summer lipids 2024: CP ED eval that resolved with nitro.  Is pending coloscopy. She is pending homes sleep study and Pfts with pulmonology. PET suggestive of small apical inferior infarct with no active ischemia.  Patient notes that she is doing ok.   There are no interval hospital/ED visit.    No chest pain or pressure. She always feels overwhelmed. Feels anxious. No SOB; but has DOE- she cannot get very fair without symptoms.  She is getting a pulmonary eval this summer. She notes a time recently when dogs were chasing her and she was running very fast and had to call EMS for oxygen.   No PND/Orthopnea.  No weight gain or leg swelling.  No palpitations or syncope.   Past Medical History:  Diagnosis Date   Anxiety    Asthma    daily and prn inhalers   Cervical disc disease    decreased range of motion   Chronic pain    shoulders, neck, lower back   Coronary artery disease    Deformity, hand    left   Depression    Eczema    Endometriosis    Lupron injection Q 3 mos.   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   Urticaria     Wears partial dentures    upper partial    Past Surgical History:  Procedure Laterality Date   ANKLE RECONSTRUCTION  02/01/2012   Procedure: RECONSTRUCTION ANKLE;  Surgeon: Toni Arthurs, MD;  Location: [facility];  Service: Orthopedics;  Laterality: Right;  Excision of right calcaneonavicular coalition with autologus fat graft interposition   BRACHIAL PLEXUS EXPLORATION     CARDIAC CATHETERIZATION     CORONARY STENT INTERVENTION N/A 07/15/2020   Procedure: CORONARY STENT INTERVENTION;  Surgeon: Corky Crafts, MD;  Location: [facility] INVASIVE CV LAB;  Service: Cardiovascular;  Laterality: N/A;   DIAGNOSTIC LAPAROSCOPY  10/02/2008   peritoneal bx.   LEFT HEART CATH AND CORONARY ANGIOGRAPHY N/A 07/15/2020   Procedure: LEFT HEART CATH AND CORONARY ANGIOGRAPHY;  Surgeon: Corky Crafts, MD;  Location: [facility] INVASIVE CV LAB;  Service: Cardiovascular;  Laterality: N/A;   MULTIPLE TOOTH EXTRACTIONS     upper teeth and wisdom teeth   OVARIAN CYST REMOVAL  2000   RIB RESECTION     left - cervical rib removal   SHOULDER ARTHROSCOPY W/ ROTATOR CUFF REPAIR  07/2011   right   SHOULDER SURGERY     left   TOOTH EXTRACTION  x 1   Current Medications: Current Meds  Medication Sig   acetaminophen (TYLENOL) 325 MG tablet Take 2 tablets (650 mg total) by mouth every 6 (six) hours as needed.   albuterol (PROVENTIL HFA;VENTOLIN HFA) 108 (90 Base) MCG/ACT inhaler Inhale 2 puffs into the lungs every 4 (four) hours as needed for wheezing or shortness of breath (cough).   aspirin EC 81 MG EC tablet Take 1 tablet (81 mg total) by mouth daily. Swallow whole.   azelastine (ASTELIN) 0.1 % nasal spray Place 1 spray into both nostrils 2 (two) times daily. Use in each nostril as directed   budesonide-formoterol (SYMBICORT) 80-4.5 MCG/ACT inhaler Inhale 2 puffs into the lungs daily. (Patient taking differently: Inhale 2 puffs into the lungs daily as needed (sob/wheezing).)   busPIRone (BUSPAR) 5  MG tablet Take 1 tablet (5 mg total) by mouth 2 (two) times daily.   cyclobenzaprine (FLEXERIL) 10 MG tablet Take 1 tablet (10 mg total) by mouth 2 (two) times daily as needed for muscle spasms.   diltiazem (CARTIA XT) 120 MG 24 hr capsule Take 1 capsule (120 mg total) by mouth daily.   EPINEPHrine (EPIPEN JR) 0.15 MG/0.3ML injection Inject 0.15 mg into the muscle daily as needed for anaphylaxis.    Evolocumab (REPATHA SURECLICK) 140 MG/ML SOAJ Inject 1 pen into the skin every 14 (fourteen) days.   fexofenadine (ALLEGRA ALLERGY) 180 MG tablet Take 1 tablet (180 mg total) by mouth as needed for allergies (hives).   fluticasone (FLONASE) 50 MCG/ACT nasal spray Place 2 sprays into both nostrils daily.   furosemide (LASIX) 20 MG tablet Take 0.5 tablets (10 mg total) by mouth daily as needed for fluid retention or swelling   gabapentin (NEURONTIN) 600 MG tablet Take 600 mg by mouth at bedtime.   hydrOXYzine (ATARAX/VISTARIL) 25 MG tablet Take 25 mg by mouth 3 (three) times daily as needed for anxiety or itching.    ibuprofen (ADVIL) 800 MG tablet Take 800 mg by mouth every 6 (six) hours as needed for moderate pain. for pain   isosorbide mononitrate (IMDUR) 60 MG 24 hr tablet Take 1 tablet (60 mg total) by mouth daily.   ketoconazole (NIZORAL) 2 % cream Apply 1 application topically 2 (two) times daily for 14 days.   lidocaine (XYLOCAINE) 5 % ointment Apply 1 application topically as needed.   naproxen (NAPROSYN) 500 MG tablet Take 1 tablet (500 mg total) by mouth 2 (two) times daily. (Patient taking differently: Take 500 mg by mouth daily as needed for mild pain or moderate pain.)   nitroGLYCERIN (NITROSTAT) 0.4 MG SL tablet PLACE 1 TABLET (0.4 MG TOTAL) UNDER THE TONGUE EVERY 5 (FIVE) MINUTES FOR 3 DOSES AS NEEDED FOR CHEST PAIN.   oxyCODONE (ROXICODONE) 5 MG immediate release tablet Take 1 tablet (5 mg total) by mouth every 4 (four) hours as needed for severe pain.   pantoprazole (PROTONIX) 20 MG  tablet Take 1 tablet (20 mg total) by mouth daily.   polyethylene glycol (MIRALAX / GLYCOLAX) 17 g packet Take 17 g by mouth every other day.   potassium chloride SA (KLOR-CON M) 20 MEQ tablet Take one tablet twice a day for ten days, then one tablet daily   rosuvastatin (CRESTOR) 40 MG tablet Take 1 tablet (40 mg total) by mouth daily.   senna (SENOKOT) 8.6 MG TABS tablet Take 1 tablet by mouth daily as needed for moderate constipation.   tiZANidine (ZANAFLEX) 4 MG tablet Take 1 tablet (4 mg total) by  mouth every 6 (six) hours as needed for muscle spasms.   triamcinolone cream (KENALOG) 0.1 % Apply 1 Application topically 2 (two) times daily.   [DISCONTINUED] albuterol (VENTOLIN HFA) 108 (90 Base) MCG/ACT inhaler Inhale 2 puffs into the lungs every 4 (four) hours.     Allergies:   Pfizer-biontech covid-19 vacc [covid-19 mrna vacc (moderna)], Chocolate, Iodine, Ivp dye [iodinated contrast media], Latex, Peanut (diagnostic), Shellfish allergy, Soap & cleansers, Eicosapentaenoic acid, Fish-derived products, Peanut-containing drug products, and Soap   Social History   Socioeconomic History   Marital status: Single    Spouse name: Not on file   Number of children: Not on file   Years of education: 14   Highest education level: Not on file  Occupational History   Not on file  Tobacco Use   Smoking status: Former    Packs/day: 1.00    Years: 25.00    Additional pack years: 0.00    Total pack years: 25.00    Types: Cigarettes    Quit date: 07/14/2020    Years since quitting: 2.5    Passive exposure: Never   Smokeless tobacco: Never  Vaping Use   Vaping Use: Never used  Substance and Sexual Activity   Alcohol use: No    Alcohol/week: 0.0 standard drinks of alcohol    Comment: occasional   Drug use: No   Sexual activity: Yes    Partners: Male    Birth control/protection: None  Other Topics Concern   Not on file  Social History Narrative   Not on file   Social Determinants of  Health   Financial Resource Strain: Not on file  Food Insecurity: Not on file  Transportation Needs: Not on file  Physical Activity: Not on file  Stress: Not on file  Social Connections: Not on file    Family History: The patient's family history includes CAD in her father; COPD in her mother; Cancer in her mother; Dementia in her father; Heart failure in her mother and sister; Stroke in her father.  ROS:   Please see the history of present illness.     All other systems reviewed and are negative.  EKGs/Labs/Other Studies Reviewed:    The following studies were reviewed today:  EKG:   01/16/2023: SR BAE, rate 77 10/16/22: Sinus 75 08/15/21: Sinus bradycardia with TWI 09/21/20: Sinus rhythm rate 61 with QTc 440 07/19/2020: Sinus Rhythm 84 QTc 508  Cardiac Studies & Procedures   CARDIAC CATHETERIZATION  CARDIAC CATHETERIZATION 07/15/2020  Narrative  Prox RCA lesion is 50% stenosed. Vasospasm noted on this lesion which improved with IC NTG.  Mid RCA lesion is 99% stenosed. This was the culprit lesion which was ulcerated.  A drug-eluting stent was successfully placed using a STENT RESOLUTE ONYX 3.0X15, postdilated to 3.25 mm.  Post intervention, there is a 0% residual stenosis.  1st Mrg lesion is 80% stenosed.  A drug-eluting stent was successfully placed using a STENT RESOLUTE ONYX 2.5X18, postdilated to 3.0 mm.  Post intervention, there is a 0% residual stenosis.  Prox Cx lesion is 25% stenosed.  1st Diag lesion is 50% stenosed.  Prox LAD lesion is 25% stenosed.  The left ventricular systolic function is normal.  LV end diastolic pressure is moderately elevated.  The left ventricular ejection fraction is 55-65% by visual estimate.  There is no aortic valve stenosis.  She needs aggressive risk factor modification.  DAPT with healthy, plant based diet, high dose statin.  Findings Coronary Findings Diagnostic  Dominance:  Right  Left Anterior Descending Prox  LAD lesion is 25% stenosed.  First Diagonal Branch 1st Diag lesion is 50% stenosed.  Left Circumflex Prox Cx lesion is 25% stenosed.  First Obtuse Marginal Branch Vessel is large in size. 1st Mrg lesion is 80% stenosed.  Right Coronary Artery Prox RCA lesion is 50% stenosed. Mid RCA lesion is 99% stenosed. The lesion is type C and dissected.  Intervention  1st Mrg lesion Stent CATH LAUNCHER 6FR EBU 3.75 guide catheter was inserted. Lesion crossed with guidewire using a WIRE ASAHI PROWATER 180CM. Pre-stent angioplasty was performed using a BALLOON SAPPHIRE 2.5X15. A drug-eluting stent was successfully placed using a STENT RESOLUTE ONYX 2.5X18. Stent strut is well apposed. Post-stent angioplasty was performed using a BALLOON SAPPHIRE [place] 3.0X15. Post-Intervention Lesion Assessment The intervention was successful. Pre-interventional TIMI flow is 3. Post-intervention TIMI flow is 3. No complications occurred at this lesion. There is a 0% residual stenosis post intervention.  Mid RCA lesion Stent CATH LAUNCHER 6FR JR4 guide catheter was inserted. Lesion crossed with guidewire using a WIRE ASAHI PROWATER 180CM. Pre-stent angioplasty was performed using a BALLOON SAPPHIRE 2.5X15. A drug-eluting stent was successfully placed using a STENT RESOLUTE ONYX 3.0X15. Stent strut is well apposed. Post-stent angioplasty was performed using a BALLOON SAPPHIRE [place] 3.25X12. Post-Intervention Lesion Assessment The intervention was successful. Pre-interventional TIMI flow is 3. Post-intervention TIMI flow is 3. No complications occurred at this lesion. There is a 0% residual stenosis post intervention.   STRESS TESTS  NM PET CT CARDIAC PERFUSION MULTI W/ABSOLUTE BLOODFLOW 11/08/2022  Narrative   Findings are consistent with small area of apical inferior infarction and no ischemia. The study is low risk.   LV perfusion is abnormal. There is no evidence of ischemia. There is evidence of infarction. Defect  1: There is a small defect with mild reduction in uptake present in the apical inferior and apex location(s) that is fixed. There is abnormal wall motion in the defect area. Consistent with infarction.   Rest left ventricular function is normal. Rest EF: 60 %. Stress left ventricular function is normal. Stress EF: 62 %. End diastolic cavity size is normal. End systolic cavity size is normal. No evidence of transient ischemic dilation (TID) noted.   Myocardial blood flow was computed to be 1.74ml/g/min at rest and 2.58ml/g/min at stress. Global myocardial blood flow reserve was 1.77. High rest flows with stress flow >2, MBFR is normal.   Coronary calcium assessment not performed due to prior revascularization.  EXAM: OVER-READ INTERPRETATION  PET-CT CHEST  The following report is an over-read performed by radiologist Dr. Leatha Gilding [facility] on 11/08/2022. This over-read does not include interpretation of cardiac or coronary anatomy or pathology. The cardiac PET and cardiac CT interpretation by the cardiologist is to be attached.  COMPARISON:  None.  FINDINGS: No evidence for lymphadenopathy within the visualized mediastinum or hilar regions.  The visualized lung parenchyma shows no suspicious pulmonary nodule or mass. No focal airspace consolidation. No effusion.  Visualized portions of the upper abdomen are unremarkable.  No suspicious lytic or sclerotic osseous abnormality.  IMPRESSION: No acute or clinically significant extracardiac findings.   Electronically Signed By: Kennith Center M.D. On: 11/08/2022 09:21   ECHOCARDIOGRAM  ECHOCARDIOGRAM COMPLETE 03/03/2022  Narrative ECHOCARDIOGRAM REPORT    Patient Name:   KAMILLAH DEVIVO Date of Exam: 03/03/2022 Medical Rec #:  [id number]        Height:       59.0 in Accession #:  [id number]       Weight:       207.0 lb Date of Birth:  11/08/1965        BSA:          1.872 m Patient Age:    56 years          BP:           98/56 mmHg Patient Gender: F                HR:           88 bpm. Exam Location:  [address]  Procedure: 2D Echo, Cardiac Doppler, Color Doppler and 3D Echo  Indications:    Mitral Regurgitation I34.0; Aortic Insufficiency I35.1  History:        Patient has prior history of Echocardiogram examinations, most recent 02/24/2021. CAD; Risk Factors:Hypertension and Dyslipidemia.  Sonographer:    Thurman Coyer RDCS Referring Phys: [id number] [facility] [place]  IMPRESSIONS   1. Left ventricular ejection fraction, by estimation, is 60 to 65%. The left ventricle has normal function. The left ventricle has no regional wall motion abnormalities. Left ventricular diastolic parameters are consistent with Grade I diastolic dysfunction (impaired relaxation). 2. Right ventricular systolic function is normal. The right ventricular size is normal. There is normal pulmonary artery systolic pressure. 3. The mitral valve is normal in structure. Mild mitral valve regurgitation. No evidence of mitral stenosis. 4. The aortic valve is tricuspid. Aortic valve regurgitation is mild to moderate. No aortic stenosis is present. 5. The inferior vena cava is normal in size with greater than 50% respiratory variability, suggesting right atrial pressure of 3 mmHg.  FINDINGS Left Ventricle: Left ventricular ejection fraction, by estimation, is 60 to 65%. The left ventricle has normal function. The left ventricle has no regional wall motion abnormalities. The left ventricular internal cavity size was normal in size. There is no left ventricular hypertrophy. Left ventricular diastolic parameters are consistent with Grade I diastolic dysfunction (impaired relaxation).  Right Ventricle: The right ventricular size is normal. No increase in right ventricular wall thickness. Right ventricular systolic function is normal. There is normal pulmonary artery systolic pressure. The tricuspid regurgitant velocity  is 2.02 m/s, and with an assumed right atrial pressure of 3 mmHg, the estimated right ventricular systolic pressure is 19.3 mmHg.  Left Atrium: Left atrial size was normal in size.  Right Atrium: Right atrial size was normal in size.  Pericardium: There is no evidence of pericardial effusion.  Mitral Valve: The mitral valve is normal in structure. Mild mitral valve regurgitation. No evidence of mitral valve stenosis.  Tricuspid Valve: The tricuspid valve is normal in structure. Tricuspid valve regurgitation is trivial. No evidence of tricuspid stenosis.  Aortic Valve: The aortic valve is tricuspid. Aortic valve regurgitation is mild to moderate. Aortic regurgitation PHT measures 473 msec. No aortic stenosis is present.  Pulmonic Valve: The pulmonic valve was normal in structure. Pulmonic valve regurgitation is not visualized. No evidence of pulmonic stenosis.  Aorta: The aortic root is normal in size and structure. Ascending aorta measurements are within normal limits for age when indexed to body surface area.  Venous: The inferior vena cava is normal in size with greater than 50% respiratory variability, suggesting right atrial pressure of 3 mmHg.  IAS/Shunts: No atrial level shunt detected by color flow Doppler.   LEFT VENTRICLE PLAX 2D LVIDd:         5.20 cm   Diastology LVIDs:  3.50 cm   LV e' medial:    6.76 cm/s LV PW:         0.90 cm   LV E/e' medial:  11.6 LV IVS:        0.90 cm   LV e' lateral:   6.67 cm/s LVOT diam:     2.00 cm   LV E/e' lateral: 11.7 LV SV:         78 LV SV Index:   41 LVOT Area:     3.14 cm  3D Volume EF: 3D EF:        64 % LV EDV:       138 ml LV ESV:       50 ml LV SV:        88 ml  RIGHT VENTRICLE RV Basal diam:  3.60 cm RV Mid diam:    2.50 cm RV S prime:     14.10 cm/s TAPSE (M-mode): 1.9 cm  LEFT ATRIUM             Index        RIGHT ATRIUM           Index LA diam:        3.60 cm 1.92 cm/m   RA Area:     13.10 cm LA Vol  (A2C):   46.0 ml 24.58 ml/m  RA Volume:   28.90 ml  15.44 ml/m LA Vol (A4C):   44.3 ml 23.67 ml/m LA Biplane Vol: 46.7 ml 24.95 ml/m AORTIC VALVE LVOT Vmax:   114.00 cm/s LVOT Vmean:  75.100 cm/s LVOT VTI:    0.247 m AI PHT:      473 msec  AORTA Ao Root diam: 2.80 cm Ao Asc diam:  3.10 cm  MITRAL VALVE               TRICUSPID VALVE MV Area (PHT): 4.63 cm    TR Peak grad:   16.3 mmHg MV Decel Time: 164 msec    TR Vmax:        202.00 cm/s MR Peak grad: 120.1 mmHg MR Mean grad: 82.0 mmHg    SHUNTS MR Vmax:      548.00 cm/s  Systemic VTI:  0.25 m MR Vmean:     431.0 cm/s   Systemic Diam: 2.00 cm MV E velocity: 78.10 cm/s MV A velocity: 87.80 cm/s MV E/A ratio:  0.89  Arvilla Meres MD Electronically signed by Arvilla Meres MD Signature Date/Time: 03/03/2022/6:18:11 PM    Final    MONITORS  LONG TERM MONITOR (3-14 DAYS) 10/14/2020  Narrative  Patient had a minimum heart rate of 49 bpm, maximum heart rate of 174 bpm, and average heart rate of 76 bpm.  Predominant underlying rhythm was sinus rhythm.  One run of supraventricular tachycardia occurred lasting 4 beats at longest with a max rate of 174 bpm at fastest.  Isolated PACs were rare (<1.0%), with rare couplets present.  Isolated PVCs were rare (<1.0%).  No evidence of complete heart block .  Triggered and diary events associated with sinus rhythm.  No malignant arrhythmias.            Recent Labs: 04/14/2022: Magnesium 1.9 10/06/2022: ALT 14 10/07/2022: B Natriuretic Peptide 29.4 11/20/2022: BUN 12; Creatinine, Ser 0.64; Hemoglobin 11.6; Platelets 270; Potassium 3.1; Sodium 136  Recent Lipid Panel    Component Value Date/Time   CHOL 131 03/03/2022 0912   TRIG 40 03/03/2022 0912   HDL 86 03/03/2022 0912  CHOLHDL 1.5 03/03/2022 0912   CHOLHDL 3.1 07/15/2020 1730   VLDL 9 07/15/2020 1730   LDLCALC 35 03/03/2022 0912     Physical Exam:    VS:  BP 112/68   Pulse 77   Ht 4\' 11"  (1.499 m)   Wt  211 lb (95.7 kg)   LMP 07/12/2016 (Approximate) Comment: 2-3 day cycle  SpO2 95%   BMI 42.62 kg/m     Wt Readings from Last 3 Encounters:  01/16/23 211 lb (95.7 kg)  01/12/23 212 lb 3.2 oz (96.3 kg)  11/19/22 209 lb 11.2 oz (95.1 kg)    Gen: no distress, morbid obesity   Neck: No JVD Cardiac: No Rubs or Gallops, systolic and diastolic murmur, +2 radial pulses Respiratory: Clear to auscultation bilaterally, normal effort, normal  respiratory rate GI: Soft, nontender, non-distended  MS: No  edema;  moves all extremities Integument: Skin feels warm Neuro:  At time of evaluation, alert and oriented to person/place/time/situation  Psych: Normal affect, patient feels well  ASSESSMENT:    1. Coronary artery disease involving native coronary artery of native heart without angina pectoris   2. Essential hypertension   3. Nonrheumatic mitral valve regurgitation   4. Nonrheumatic aortic valve insufficiency   5. PVC (premature ventricular contraction)   6. DOE (dyspnea on exertion)     PLAN:    Coronary Artery Disease; Obstructive and possible vasospasm component HLD - asymptomatic - anatomy: 50% pRCA, mRCA PCI, OM1 PCI, pLCX 25%, D1 50%, pLAD 25% - continue ASA 81 mg - continue statin, goal LDL < 55;  continue repatha - Gven potentially having a vasospastic component, keep CCB  - continue PRN nitro and increase Imdur 60 mg  Barring new severe AI or MR, Low risk for coloscopy; can hold ASA as needed  HTN Morbid obesity - no history of MEN2a or medullary thyroid cancer - no history of pancreatitis or gallstones. - BMI is 30+  with weight related comorbidities including  (Has obstructive CAD, will consider Wegovy or Zepbound) - Weight loss of < 1% over 3-6 months  (< 5%) with comprehensive lifestyle interventions that include stopping soda, decreasing chocolate and sweets - would meet criteria for GLP-1 based therapy  Asymptomatic PVCs - continue CCB  Mitral Valve  Regurgitation (mild) AI (Mild to moderate)  Echo planned for later Thursday - for now continue PRN lasix  Winter follow up with me  Medication Adjustments/Labs and Tests Ordered: Current medicines are reviewed at length with the patient today.  Concerns regarding medicines are outlined above.  Orders Placed This Encounter  Procedures   AMB Referral to [facility]   EKG 12-Lead    No orders of the defined types were placed in this encounter.    Patient Instructions  Medication Instructions:  Your physician recommends that you continue on your current medications as directed. Please refer to the Current Medication list given to you today.  *If you need a refill on your cardiac medications before your next appointment, please call your pharmacy*   Lab Work: NONE If you have labs (blood work) drawn today and your tests are completely normal, you will receive your results only by: MyChart Message (if you have MyChart) OR A paper copy in the mail If you have any lab test that is abnormal or we need to change your treatment, we will call you to review the results.   Testing/Procedures: Your physician has referred you to our Pharmacy Clinic for consideration of weight loss medication.  Follow-Up: At [facility], you and your health needs are our priority.  As part of our continuing mission to provide you with exceptional heart care, we have created designated Provider Care Teams.  These Care Teams include your primary Cardiologist (physician) and Advanced Practice Providers (APPs -  Physician Assistants and Nurse Practitioners) who all work together to provide you with the care you need, when you need it.   Your next appointment:   8 month(s)  Provider:   Christell Constant, MD        Signed, Christell Constant, MD  01/16/2023 2:38 PM    [facility]

## 2023-01-16 NOTE — Patient Instructions (Signed)
Medication Instructions:  Your physician recommends that you continue on your current medications as directed. Please refer to the Current Medication list given to you today.  *If you need a refill on your cardiac medications before your next appointment, please call your pharmacy*   Lab Work: NONE If you have labs (blood work) drawn today and your tests are completely normal, you will receive your results only by: MyChart Message (if you have MyChart) OR A paper copy in the mail If you have any lab test that is abnormal or we need to change your treatment, we will call you to review the results.   Testing/Procedures: Your physician has referred you to our Pharmacy Clinic for consideration of weight loss medication.    Follow-Up: At [facility], you and your health needs are our priority.  As part of our continuing mission to provide you with exceptional heart care, we have created designated Provider Care Teams.  These Care Teams include your primary Cardiologist (physician) and Advanced Practice Providers (APPs -  Physician Assistants and Nurse Practitioners) who all work together to provide you with the care you need, when you need it.   Your next appointment:   8 month(s)  Provider:   Christell Constant, MD

## 2023-01-18 ENCOUNTER — Other Ambulatory Visit (HOSPITAL_COMMUNITY): Payer: Self-pay

## 2023-01-18 ENCOUNTER — Ambulatory Visit (HOSPITAL_COMMUNITY): Payer: 59 | Attending: Internal Medicine

## 2023-01-18 DIAGNOSIS — I351 Nonrheumatic aortic (valve) insufficiency: Secondary | ICD-10-CM | POA: Insufficient documentation

## 2023-01-18 LAB — ECHOCARDIOGRAM COMPLETE
Area-P 1/2: 4.96 cm2
MV M vel: 5.44 m/s
MV Peak grad: 118.4 mmHg
P 1/2 time: 370 msec
S' Lateral: 2.3 cm

## 2023-01-23 ENCOUNTER — Other Ambulatory Visit (HOSPITAL_COMMUNITY): Payer: Self-pay

## 2023-01-23 ENCOUNTER — Other Ambulatory Visit: Payer: Self-pay | Admitting: Internal Medicine

## 2023-01-24 ENCOUNTER — Other Ambulatory Visit (HOSPITAL_COMMUNITY): Payer: Self-pay

## 2023-01-24 MED ORDER — DILTIAZEM HCL ER COATED BEADS 120 MG PO CP24
120.0000 mg | ORAL_CAPSULE | Freq: Every day | ORAL | 3 refills | Status: DC
  Filled 2023-01-24: qty 90, 90d supply, fill #0
  Filled 2023-02-02: qty 60, 60d supply, fill #0
  Filled 2023-11-26: qty 90, 90d supply, fill #0

## 2023-01-30 ENCOUNTER — Telehealth: Payer: Self-pay | Admitting: Pharmacist

## 2023-01-30 NOTE — Telephone Encounter (Signed)
Dr. Izora Ribas reached out about GLP-1 for pt. PA for [facility] approved. Called pt to discuss. LVM for patient to call back.

## 2023-02-01 NOTE — Telephone Encounter (Signed)
I spoke with patient. She is interested in [place]. We reviewed the titration and common side effects. Will call Wegovy 0.25mg  into the pharmacy. She already has apt with [place] 6/10. She has a colonoscopy 6/7. Knows not to start until AFTER.

## 2023-02-02 ENCOUNTER — Other Ambulatory Visit (HOSPITAL_COMMUNITY): Payer: Self-pay

## 2023-02-02 ENCOUNTER — Telehealth: Payer: Self-pay

## 2023-02-02 MED ORDER — CLENPIQ 10-3.5-12 MG-GM -GM/175ML PO SOLN
ORAL | 0 refills | Status: DC
  Filled 2023-02-02: qty 350, 2d supply, fill #0

## 2023-02-02 NOTE — Telephone Encounter (Signed)
   Pre-operative Risk Assessment    Patient Name: Wendy [name]  DOB: [birth year] MRN: [id number]      Request for Surgical Clearance    Procedure:   Colonoscopy  Date of Surgery:  Clearance 02/16/23                                 Surgeon:  Dr. Phillips Climes Group or Practice Name:  [facility] Phone number:  [phone] Fax number:  [phone]   Type of Clearance Requested:   - Medical  - Pharmacy:  Hold Aspirin Pt will need instructions on when/if to hold   Type of Anesthesia:   Propofol   Additional requests/questions:    Signed, Zada Finders   02/02/2023, 3:12 PM

## 2023-02-02 NOTE — Telephone Encounter (Signed)
   Patient Name: CIA SHARF  DOB: [birth year] MRN: [id number]  Primary Cardiologist: Christell Constant, MD  Chart reviewed as part of pre-operative protocol coverage. Given past medical history and time since last visit, based on ACC/AHA guidelines, [name] is at acceptable risk for the planned procedure without further cardiovascular testing.   The patient was advised that if she develops new symptoms prior to surgery to contact our office to arrange for a follow-up visit, and she verbalized understanding.  Regarding ASA therapy, we recommend continuation of ASA throughout the perioperative period.  However, if the surgeon feels that cessation of ASA is required in the perioperative period, it may be stopped 5-7 days prior to surgery with a plan to resume it as soon as felt to be feasible from a surgical standpoint in the post-operative period.   I will route this recommendation to the requesting party via Epic fax function and remove from pre-op pool.  Please call with questions.  Napoleon Form, Leodis Rains, NP 02/02/2023, 3:19 PM

## 2023-02-06 ENCOUNTER — Ambulatory Visit: Payer: 59 | Admitting: Internal Medicine

## 2023-02-08 ENCOUNTER — Other Ambulatory Visit (HOSPITAL_COMMUNITY): Payer: Self-pay

## 2023-02-09 ENCOUNTER — Encounter (HOSPITAL_COMMUNITY): Payer: Self-pay | Admitting: Gastroenterology

## 2023-02-09 ENCOUNTER — Other Ambulatory Visit (HOSPITAL_COMMUNITY): Payer: Self-pay

## 2023-02-09 NOTE — Progress Notes (Signed)
Attempted to obtain medical history via telephone, unable to reach at this time. HIPAA compliant voicemail message left requesting return call to pre surgical testing department. 

## 2023-02-12 DIAGNOSIS — G4730 Sleep apnea, unspecified: Secondary | ICD-10-CM | POA: Diagnosis not present

## 2023-02-16 ENCOUNTER — Encounter (HOSPITAL_COMMUNITY)
Admission: RE | Disposition: A | Discharge status: Home / Self Care | Payer: Self-pay | Source: Home / Self Care | Attending: Gastroenterology

## 2023-02-16 ENCOUNTER — Other Ambulatory Visit: Payer: 59

## 2023-02-16 ENCOUNTER — Encounter (HOSPITAL_COMMUNITY): Payer: Self-pay | Admitting: Gastroenterology

## 2023-02-16 ENCOUNTER — Ambulatory Visit (HOSPITAL_COMMUNITY): Payer: 59 | Admitting: Anesthesiology

## 2023-02-16 ENCOUNTER — Other Ambulatory Visit: Payer: Self-pay

## 2023-02-16 ENCOUNTER — Ambulatory Visit (HOSPITAL_COMMUNITY)
Admission: RE | Admit: 2023-02-16 | Discharge: 2023-02-16 | Disposition: A | Discharge status: Home / Self Care | Payer: 59 | Attending: Gastroenterology | Admitting: Gastroenterology

## 2023-02-16 ENCOUNTER — Ambulatory Visit (HOSPITAL_BASED_OUTPATIENT_CLINIC_OR_DEPARTMENT_OTHER): Payer: 59 | Admitting: Anesthesiology

## 2023-02-16 DIAGNOSIS — Z955 Presence of coronary angioplasty implant and graft: Secondary | ICD-10-CM | POA: Diagnosis not present

## 2023-02-16 DIAGNOSIS — Z09 Encounter for follow-up examination after completed treatment for conditions other than malignant neoplasm: Secondary | ICD-10-CM | POA: Insufficient documentation

## 2023-02-16 DIAGNOSIS — K5730 Diverticulosis of large intestine without perforation or abscess without bleeding: Secondary | ICD-10-CM | POA: Diagnosis not present

## 2023-02-16 DIAGNOSIS — Z87891 Personal history of nicotine dependence: Secondary | ICD-10-CM

## 2023-02-16 DIAGNOSIS — Z86010 Personal history of colonic polyps: Secondary | ICD-10-CM

## 2023-02-16 DIAGNOSIS — K219 Gastro-esophageal reflux disease without esophagitis: Secondary | ICD-10-CM | POA: Diagnosis not present

## 2023-02-16 DIAGNOSIS — I10 Essential (primary) hypertension: Secondary | ICD-10-CM

## 2023-02-16 DIAGNOSIS — I25119 Atherosclerotic heart disease of native coronary artery with unspecified angina pectoris: Secondary | ICD-10-CM | POA: Diagnosis not present

## 2023-02-16 DIAGNOSIS — D179 Benign lipomatous neoplasm, unspecified: Secondary | ICD-10-CM | POA: Diagnosis not present

## 2023-02-16 DIAGNOSIS — I252 Old myocardial infarction: Secondary | ICD-10-CM | POA: Insufficient documentation

## 2023-02-16 DIAGNOSIS — D1779 Benign lipomatous neoplasm of other sites: Secondary | ICD-10-CM | POA: Insufficient documentation

## 2023-02-16 DIAGNOSIS — F419 Anxiety disorder, unspecified: Secondary | ICD-10-CM | POA: Diagnosis not present

## 2023-02-16 DIAGNOSIS — D175 Benign lipomatous neoplasm of intra-abdominal organs: Secondary | ICD-10-CM | POA: Diagnosis not present

## 2023-02-16 DIAGNOSIS — I2510 Atherosclerotic heart disease of native coronary artery without angina pectoris: Secondary | ICD-10-CM | POA: Diagnosis not present

## 2023-02-16 DIAGNOSIS — Z1211 Encounter for screening for malignant neoplasm of colon: Secondary | ICD-10-CM | POA: Diagnosis not present

## 2023-02-16 DIAGNOSIS — Z6841 Body Mass Index (BMI) 40.0 and over, adult: Secondary | ICD-10-CM | POA: Insufficient documentation

## 2023-02-16 DIAGNOSIS — J45909 Unspecified asthma, uncomplicated: Secondary | ICD-10-CM | POA: Insufficient documentation

## 2023-02-16 HISTORY — PX: COLONOSCOPY WITH PROPOFOL: SHX5780

## 2023-02-16 SURGERY — COLONOSCOPY WITH PROPOFOL
Anesthesia: Monitor Anesthesia Care

## 2023-02-16 MED ORDER — PROPOFOL 1000 MG/100ML IV EMUL
INTRAVENOUS | Status: AC
  Filled 2023-02-16: qty 100

## 2023-02-16 MED ORDER — PROPOFOL 10 MG/ML IV BOLUS
INTRAVENOUS | Status: DC | PRN
  Administered 2023-02-16: 20 mg via INTRAVENOUS

## 2023-02-16 MED ORDER — LIDOCAINE 2% (20 MG/ML) 5 ML SYRINGE
INTRAMUSCULAR | Status: DC | PRN
  Administered 2023-02-16: 40 mg via INTRAVENOUS

## 2023-02-16 MED ORDER — SODIUM CHLORIDE 0.9 % IV SOLN: INTRAVENOUS | Status: DC

## 2023-02-16 MED ORDER — PROPOFOL 500 MG/50ML IV EMUL
INTRAVENOUS | Status: AC
  Filled 2023-02-16: qty 100

## 2023-02-16 MED ORDER — LACTATED RINGERS IV SOLN: INTRAVENOUS | Status: DC

## 2023-02-16 MED ORDER — PROPOFOL 500 MG/50ML IV EMUL
INTRAVENOUS | Status: DC | PRN
  Administered 2023-02-16: 125 ug/kg/min via INTRAVENOUS

## 2023-02-16 SURGICAL SUPPLY — 22 items

## 2023-02-16 NOTE — Anesthesia Postprocedure Evaluation (Signed)
Anesthesia Post Note  Patient: Wendy [name]  Procedure(s) Performed: COLONOSCOPY WITH PROPOFOL     Patient location during evaluation: Endoscopy Anesthesia Type: MAC Level of consciousness: awake Pain management: pain level controlled Vital Signs Assessment: post-procedure vital signs reviewed and stable Respiratory status: spontaneous breathing, nonlabored ventilation and respiratory function stable Cardiovascular status: blood pressure returned to baseline and stable Postop Assessment: no apparent nausea or vomiting Anesthetic complications: no   No notable events documented.  Last Vitals:  Vitals:   02/16/23 0820 02/16/23 0830  BP: 126/64   Pulse: 80 77  Resp: (!) 23 19  Temp:    SpO2: 96% 99%    Last Pain:  Vitals:   02/16/23 0830  TempSrc:   PainSc: 0-No pain                 Maeson Lourenco P Imran Nuon

## 2023-02-16 NOTE — Discharge Instructions (Signed)

## 2023-02-16 NOTE — Transfer of Care (Signed)
Immediate Anesthesia Transfer of Care Note  Patient: MAHUM BETTEN  Procedure(s) Performed: COLONOSCOPY WITH PROPOFOL  Patient Location: PACU  Anesthesia Type:MAC  Level of Consciousness: oriented, drowsy, and patient cooperative  Airway & Oxygen Therapy: Patient Spontanous Breathing and Patient connected to face mask oxygen  Post-op Assessment: Report given to RN and Post -op Vital signs reviewed and stable  Post vital signs: Reviewed  Last Vitals:  Vitals Value Taken Time  BP    Temp    Pulse 82 02/16/23 0800  Resp    SpO2 97 % 02/16/23 0800  Vitals shown include unvalidated device data.  Last Pain:  Vitals:   02/16/23 0650  TempSrc: Temporal  PainSc: 0-No pain         Complications: No notable events documented.

## 2023-02-16 NOTE — H&P (Signed)
Wendy [name] HPI: At this time the patient denies any problems with nausea, vomiting, fevers, chills, abdominal pain, diarrhea, constipation, melena, GERD, or dysphagia. The patient denies any known family history of colon cancers. No complaints of chest pain, SOB, MI, or sleep apnea.  Her colonoscopy on 01/08/2018 was positive for a small adenoma.  She reports having problems with intermittent hematochezia.  The last time she had some bleeding was 4 days ago, but before that time it was several months.  During the interval time period she suffered with an MI in 2021 and she had two stents placed.  Her last use of NTG was two weeks ago, but she feels that it is more from an anxiety standpoint.  Her NM PET CT cardiac perfusion showed a low risk study.  There was no ischemia, but there was evidence of an apical inferior infarct.  The EF was 60%.   Past Medical History:  Diagnosis Date   Anxiety    Asthma    daily and prn inhalers   Cervical disc disease    decreased range of motion   Chronic pain    shoulders, neck, lower back   Coronary artery disease    Deformity, hand    left   Depression    Eczema    Endometriosis    Lupron injection Q 3 mos.   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   Urticaria    Wears partial dentures    upper partial    Past Surgical History:  Procedure Laterality Date   ANKLE RECONSTRUCTION  02/01/2012   Procedure: RECONSTRUCTION ANKLE;  Surgeon: Toni Arthurs, MD;  Location: [facility];  Service: Orthopedics;  Laterality: Right;  Excision of right calcaneonavicular coalition with autologus fat graft interposition   BRACHIAL PLEXUS EXPLORATION     CARDIAC CATHETERIZATION     CORONARY STENT INTERVENTION N/A 07/15/2020   Procedure: CORONARY  STENT INTERVENTION;  Surgeon: Corky Crafts, MD;  Location: [facility] INVASIVE CV LAB;  Service: Cardiovascular;  Laterality: N/A;   DIAGNOSTIC LAPAROSCOPY  10/02/2008   peritoneal bx.   LEFT HEART CATH AND CORONARY ANGIOGRAPHY N/A 07/15/2020   Procedure: LEFT HEART CATH AND CORONARY ANGIOGRAPHY;  Surgeon: Corky Crafts, MD;  Location: [facility] INVASIVE CV LAB;  Service: Cardiovascular;  Laterality: N/A;   MULTIPLE TOOTH EXTRACTIONS     upper teeth and wisdom teeth   OVARIAN CYST REMOVAL  2000   RIB RESECTION     left - cervical rib removal   SHOULDER ARTHROSCOPY W/ ROTATOR CUFF REPAIR  07/2011   right   SHOULDER SURGERY     left   TOOTH EXTRACTION     x 1    Family History  Problem Relation Age of Onset   Heart failure Mother    Cancer Mother    COPD Mother    Dementia Father    Stroke Father    CAD Father    Heart failure Sister     Social History:  reports that she quit smoking about 2 years ago. Her smoking use included cigarettes. She has a 25.00 pack-year smoking history. She has never been exposed to tobacco smoke. She has never used smokeless tobacco. She reports that she does  not drink alcohol and does not use drugs.  Allergies:  Allergies  Allergen Reactions   Pfizer-Biontech Covid-19 Vacc [Covid-19 Mrna Vacc (Moderna)] Shortness Of Breath and Swelling    Tightness in Chest   Chocolate Hives   Iodine     Other Reaction(s): Unknown   Ivp Dye [Iodinated Contrast Media] Nausea And Vomiting    After CT scan   Latex Hives, Itching and Swelling   Peanut (Diagnostic)     Other Reaction(s): Unknown   Shellfish Allergy    Soap & Cleansers     Other Reaction(s): Unknown   Eicosapentaenoic Acid     Other Reaction(s): GI Intolerance   Fish-Derived Products Nausea And Vomiting   Peanut-Containing Drug Products Hives   Soap Rash    Medications: Scheduled: Continuous:  sodium chloride     lactated ringers 50 mL/hr at 02/16/23 1610    No results found for this or  any previous visit (from the past 24 hour(s)).   No results found.  ROS:  As stated above in the HPI otherwise negative.  Blood pressure 112/60, pulse 93, temperature (!) 97 F (36.1 C), temperature source Temporal, resp. rate 20, height 4\' 11"  (1.499 m), weight 94.3 kg, last menstrual period 07/12/2016, SpO2 99 %.    PE: Gen: NAD, Alert and Oriented HEENT:  /AT, EOMI Neck: Supple, no LAD Lungs: CTA Bilaterally CV: RRR without M/G/R ABD: Soft, NTND, +BS Ext: No C/C/E  Assessment/Plan: 1) Personal history of polyps - colonoscopy.  Phoebe Marter D 02/16/2023, 7:26 AM

## 2023-02-16 NOTE — Op Note (Signed)
[facility] Patient Name: Wendy [name] Procedure Date: 02/16/2023 MRN: [id number] Attending MD: Jeani Hawking , MD, [id number] Date of Birth: 22-Jan-1966 CSN: [id number] Age: 57 Admit Type: Outpatient Procedure:                Colonoscopy Indications:              High risk colon cancer surveillance: Personal                            history of colonic polyps Providers:                Jeani Hawking, MD, Lorenza Evangelist, RN, Salley Scarlet, Technician, Romie Minus, CRNA Referring MD:              Medicines:                Propofol per Anesthesia Complications:            No immediate complications. Estimated Blood Loss:     Estimated blood loss: none. Procedure:                Pre-Anesthesia Assessment:                           - Prior to the procedure, a History and Physical                            was performed, and patient medications and                            allergies were reviewed. The patient's tolerance of                            previous anesthesia was also reviewed. The risks                            and benefits of the procedure and the sedation                            options and risks were discussed with the patient.                            All questions were answered, and informed consent                            was obtained. Prior Anticoagulants: The patient has                            taken no anticoagulant or antiplatelet agents. ASA                            Grade Assessment: III - A patient with severe  systemic disease. After reviewing the risks and                            benefits, the patient was deemed in satisfactory                            condition to undergo the procedure.                           - Sedation was administered by an anesthesia                            professional. Deep sedation was attained.                           After obtaining  informed consent, the colonoscope                            was passed under direct vision. Throughout the                            procedure, the patient's blood pressure, pulse, and                            oxygen saturations were monitored continuously. The                            CF-HQ190L ([id number]) Olympus colonoscope was                            introduced through the anus and advanced to the the                            cecum, identified by appendiceal orifice and                            ileocecal valve. The colonoscopy was performed                            without difficulty. The patient tolerated the                            procedure well. The quality of the bowel                            preparation was evaluated using the BBPS [facility]                            Bowel Preparation Scale) with scores of: Right                            Colon = 3, Transverse Colon = 3 and Left Colon = 3                            (  entire mucosa seen well with no residual staining,                            small fragments of stool or opaque liquid). The                            total BBPS score equals 9. The ileocecal valve,                            appendiceal orifice, and rectum were photographed. Scope In: 7:39:27 AM Scope Out: 7:52:49 AM Scope Withdrawal Time: 0 hours 9 minutes 31 seconds  Total Procedure Duration: 0 hours 13 minutes 22 seconds  Findings:      There was a large lipoma, in the ascending colon.      A few medium-mouthed diverticula were found in the sigmoid colon. 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                           - Diverticulosis in the sigmoid colon.                           - No specimens collected. Moderate Sedation:      Not Applicable - Patient had care per Anesthesia. Recommendation:           - Patient has a contact number available for                            emergencies. The signs and symptoms of  potential                            delayed complications were discussed with the                            patient. Return to normal activities tomorrow.                            Written discharge instructions were provided to the                            patient.                           - Resume previous diet.                           - Continue present medications.                           - Repeat colonoscopy in 10 years for surveillance. Procedure Code(s):        --- Professional ---                           [phone], Colonoscopy, flexible; diagnostic, including  collection of specimen(s) by brushing or washing,                            when performed (separate procedure) Diagnosis Code(s):        --- Professional ---                           Z86.010, Personal history of colonic polyps                           D17.5, Benign lipomatous neoplasm of                            intra-abdominal organs                           K57.30, Diverticulosis of large intestine without                            perforation or abscess without bleeding CPT copyright 2022 American Medical Association. All rights reserved. The codes documented in this report are preliminary and upon coder review may  be revised to meet current compliance requirements. Jeani Hawking, MD Jeani Hawking, MD 02/16/2023 7:59:05 AM This report has been signed electronically. Number of Addenda: 0

## 2023-02-16 NOTE — Anesthesia Procedure Notes (Signed)
Procedure Name: MAC Date/Time: 02/16/2023 7:35 AM  Performed by: Lovie Chol, CRNAPre-anesthesia Checklist: Patient identified, Emergency Drugs available, Suction available and Patient being monitored Oxygen Delivery Method: Simple face mask

## 2023-02-16 NOTE — Anesthesia Preprocedure Evaluation (Addendum)
Anesthesia Evaluation  Patient identified by MRN, date of birth, ID band Patient awake    Reviewed: Allergy & Precautions, NPO status , Patient's Chart, lab work & pertinent test results  Airway Mallampati: III  TM Distance: >3 FB Neck ROM: Full    Dental  (+) Partial Upper, Dental Advisory Given   Pulmonary asthma , former smoker   Pulmonary exam normal        Cardiovascular hypertension, + angina  + CAD and + Cardiac Stents  Normal cardiovascular exam     Neuro/Psych  Headaches PSYCHIATRIC DISORDERS Anxiety Depression     Neuromuscular disease    GI/Hepatic Neg liver ROS, Bowel prep,GERD  Medicated and Controlled,,  Endo/Other    Morbid obesity  Renal/GU negative Renal ROS     Musculoskeletal  (+) Arthritis ,    Abdominal  (+) + obese  Peds  Hematology negative hematology ROS (+)   Anesthesia Other Findings screening  Reproductive/Obstetrics                              Anesthesia Physical Anesthesia Plan  ASA: 4  Anesthesia Plan: MAC   Post-op Pain Management:    Induction: Intravenous  PONV Risk Score and Plan: 2 and Propofol infusion and Treatment may vary due to age or medical condition  Airway Management Planned: Simple Face Mask  Additional Equipment:   Intra-op Plan:   Post-operative Plan:   Informed Consent: I have reviewed the patients History and Physical, chart, labs and discussed the procedure including the risks, benefits and alternatives for the proposed anesthesia with the patient or authorized representative who has indicated his/her understanding and acceptance.     Dental advisory given  Plan Discussed with: CRNA  Anesthesia Plan Comments:         Anesthesia Quick Evaluation

## 2023-02-19 ENCOUNTER — Ambulatory Visit: Payer: 59 | Attending: Internal Medicine | Admitting: Pharmacist

## 2023-02-19 ENCOUNTER — Other Ambulatory Visit (HOSPITAL_COMMUNITY): Payer: Self-pay

## 2023-02-19 VITALS — Wt 214.0 lb

## 2023-02-19 DIAGNOSIS — Z6841 Body Mass Index (BMI) 40.0 and over, adult: Secondary | ICD-10-CM

## 2023-02-19 MED ORDER — SEMAGLUTIDE-WEIGHT MANAGEMENT 0.5 MG/0.5ML ~~LOC~~ SOAJ
0.5000 mg | SUBCUTANEOUS | 0 refills | Status: DC
  Filled 2023-02-19 – 2023-03-13 (×2): qty 2, 28d supply, fill #0

## 2023-02-19 MED ORDER — SEMAGLUTIDE-WEIGHT MANAGEMENT 2.4 MG/0.75ML ~~LOC~~ SOAJ
2.4000 mg | SUBCUTANEOUS | 5 refills | Status: DC
  Filled 2023-02-19: qty 3, fill #0
  Filled 2023-06-11: qty 3, 28d supply, fill #0
  Filled 2023-07-18 (×2): qty 3, 28d supply, fill #1

## 2023-02-19 MED ORDER — SEMAGLUTIDE-WEIGHT MANAGEMENT 1 MG/0.5ML ~~LOC~~ SOAJ
1.0000 mg | SUBCUTANEOUS | 0 refills | Status: AC
  Filled 2023-02-19 – 2023-04-16 (×2): qty 2, 28d supply, fill #0

## 2023-02-19 MED ORDER — SEMAGLUTIDE-WEIGHT MANAGEMENT 1.7 MG/0.75ML ~~LOC~~ SOAJ
1.7000 mg | SUBCUTANEOUS | 0 refills | Status: AC
  Filled 2023-02-19 – 2023-05-09 (×2): qty 3, 28d supply, fill #0

## 2023-02-19 MED ORDER — SEMAGLUTIDE-WEIGHT MANAGEMENT 0.25 MG/0.5ML ~~LOC~~ SOAJ
0.2500 mg | SUBCUTANEOUS | 0 refills | Status: AC
  Filled 2023-02-19: qty 2, 28d supply, fill #0

## 2023-02-19 NOTE — Progress Notes (Unsigned)
Patient ID: RYLINN HISHMEH                 DOB: Jul 02, 1966                    MRN: [id number]     HPI: Wendy [name] is a 57 y.o. female patient referred to pharmacy clinic by Dr Izora Ribas to initiate weight loss therapy with GLP1-RA. PMH is significant for CAD s/p NSTEMI 07/16/20 with mRCA DES, 50% pRCA, OM1 PCI, and D150%, HTN, HLD with LDL 35 on Repatha, former tobacco use, and obesity. Most recent BMI 43.  Insurance approved [employer] coverage - has Medicare and Medicaid, Reginal Lutes has new CV indication. Advised not to start until after her colonoscopy on 6/7. Presents today for med initiation and education.  Diet: Currently drinks a 2L of regular Pepsi every day, also drinks regular Gatorade daily. Snacks on chips and candy bars at night.  Exercise: plans to start going to the gym again and start walking  Family History: CAD in her father; COPD in her mother; Cancer in her mother; Dementia in her father; Heart failure in her mother and sister; Stroke in her father.   Social History: Former tobacco use 1 PPD x 25 years, quit in 2021. Occasional alcohol use, no drug use.  Labs: Lab Results  Component Value Date   HGBA1C 5.0 07/15/2020    Wt Readings from Last 1 Encounters:  02/16/23 208 lb (94.3 kg)    BP Readings from Last 1 Encounters:  02/16/23 126/64   Pulse Readings from Last 1 Encounters:  02/16/23 77       Component Value Date/Time   CHOL 131 03/03/2022 0912   TRIG 40 03/03/2022 0912   HDL 86 03/03/2022 0912   CHOLHDL 1.5 03/03/2022 0912   CHOLHDL 3.1 07/15/2020 1730   VLDL 9 07/15/2020 1730   LDLCALC 35 03/03/2022 0912    Past Medical History:  Diagnosis Date   Anxiety    Asthma    daily and prn inhalers   Cervical disc disease    decreased range of motion   Chronic pain    shoulders, neck, lower back   Coronary artery disease    Deformity, hand    left   Depression    Eczema    Endometriosis    Lupron injection Q 3 mos.   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 30 tablet 0   albuterol (PROVENTIL HFA;VENTOLIN HFA) 108 (90 Base) MCG/ACT inhaler Inhale 2 puffs into the lungs every 4 (four) hours as needed for wheezing or shortness of breath (cough). 1 Inhaler 0   aspirin EC 81 MG EC tablet Take 1 tablet (81 mg total) by mouth daily. Swallow whole. 30 tablet 11   azelastine (ASTELIN) 0.1 % nasal spray Place 1 spray into both nostrils 2 (two) times daily. Use in each nostril as directed 30 mL 5   budesonide-formoterol (SYMBICORT) 80-4.5  MCG/ACT inhaler Inhale 2 puffs into the lungs daily. (Patient taking differently: Inhale 2 puffs into the lungs daily as needed (sob/wheezing).) 10.2 g 0   busPIRone (BUSPAR) 5 MG tablet Take 1 tablet (5 mg total) by mouth 2 (two) times daily. 60 tablet 5   cyclobenzaprine (FLEXERIL) 10 MG tablet Take 1 tablet (10 mg total) by mouth 2 (two) times daily as needed for muscle spasms. 20 tablet 0   diltiazem (CARTIA XT) 120 MG 24 hr capsule Take 1 capsule (120 mg total) by mouth daily. 90 capsule 2   diltiazem (CARTIA XT) 120 MG 24 hr capsule Take 1 capsule (120 mg total) by mouth daily. 90 capsule 3   EPINEPHrine (EPIPEN JR) 0.15 MG/0.3ML injection Inject 0.15 mg into the muscle daily as needed for anaphylaxis.      Evolocumab (REPATHA SURECLICK) 140 MG/ML SOAJ Inject 1 pen into the skin every 14 (fourteen) days. 6 mL 3   fexofenadine (ALLEGRA ALLERGY) 180 MG tablet Take 1 tablet (180 mg total) by mouth as needed for allergies (hives). 30 tablet 5    fluticasone (FLONASE) 50 MCG/ACT nasal spray Place 2 sprays into both nostrils daily. 16 g 5   furosemide (LASIX) 20 MG tablet Take 0.5 tablets (10 mg total) by mouth daily as needed for fluid retention or swelling 45 tablet 2   gabapentin (NEURONTIN) 600 MG tablet Take 600 mg by mouth at bedtime.     hydrOXYzine (ATARAX/VISTARIL) 25 MG tablet Take 25 mg by mouth 3 (three) times daily as needed for anxiety or itching.      ibuprofen (ADVIL) 800 MG tablet Take 800 mg by mouth every 6 (six) hours as needed for moderate pain. for pain     isosorbide mononitrate (IMDUR) 60 MG 24 hr tablet Take 1 tablet (60 mg total) by mouth daily. 90 tablet 3   ketoconazole (NIZORAL) 2 % cream Apply 1 application topically 2 (two) times daily for 14 days. 30 g 0   lidocaine (XYLOCAINE) 5 % ointment Apply 1 application topically as needed. 30 g 0   naproxen (NAPROSYN) 500 MG tablet Take 1 tablet (500 mg total) by mouth 2 (two) times daily. (Patient taking differently: Take 500 mg by mouth daily as needed for mild pain or moderate pain.) 30 tablet 0   nitroGLYCERIN (NITROSTAT) 0.4 MG SL tablet PLACE 1 TABLET (0.4 MG TOTAL) UNDER THE TONGUE EVERY 5 (FIVE) MINUTES FOR 3 DOSES AS NEEDED FOR CHEST PAIN. 25 tablet 8   oxyCODONE (ROXICODONE) 5 MG immediate release tablet Take 1 tablet (5 mg total) by mouth every 4 (four) hours as needed for severe pain. 30 tablet 0   pantoprazole (PROTONIX) 20 MG tablet Take 1 tablet (20 mg total) by mouth daily. 30 tablet 3   polyethylene glycol (MIRALAX / GLYCOLAX) 17 g packet Take 17 g by mouth every other day.     potassium chloride SA (KLOR-CON M) 20 MEQ tablet Take one tablet twice a day for ten days, then one tablet daily 40 tablet 0   rosuvastatin (CRESTOR) 40 MG tablet Take 1 tablet (40 mg total) by mouth daily. 90 tablet 3   senna (SENOKOT) 8.6 MG TABS tablet Take 1 tablet by mouth daily as needed for moderate constipation.     Sod Picosulfate-Mag Ox-Cit Acd (CLENPIQ) 10-3.5-12 MG-GM  -GM/175ML SOLN Use as directed per office and not instructions on packaging. Do not refrigerate. 350 mL 0   tiZANidine (ZANAFLEX) 4 MG tablet Take 1 tablet (4 mg  total) by mouth every 6 (six) hours as needed for muscle spasms. 60 tablet 0   triamcinolone cream (KENALOG) 0.1 % Apply 1 Application topically 2 (two) times daily. 30 g 0   No current facility-administered medications on file prior to visit.    Allergies  Allergen Reactions   Pf[employer] Covid-19 Vacc [Covid-19 Mrna Vacc (Moderna)] Shortness Of Breath and Swelling    Tightness in Chest   Chocolate Hives   Iodine     Other Reaction(s): Unknown   Ivp Dye [Iodinated Contrast Media] Nausea And Vomiting    After CT scan   Latex Hives, Itching and Swelling   Peanut (Diagnostic)     Other Reaction(s): Unknown   Shellfish Allergy    Soap & Cleansers     Other Reaction(s): Unknown   Eicosapentaenoic Acid     Other Reaction(s): GI Intolerance   Fish-Derived Products Nausea And Vomiting   Peanut-Containing Drug Products Hives   Soap Rash     Assessment/Plan:  1. Weight loss - Pt with BMI of 43 and history of ASCVD, starting [facility] today for both weight loss and CV risk reduction. Counseled on injection technique, efficacy, and GI side effects. Has both Medicare and Medicaid so copay will be low. Encouraged 150 minutes of physical activity. Reviewed diet in detail, pt currently drinking a 2L of regular Pepsi and regular Gatorade every day, also snacks on chips and cookies regularly at night. Discussed changing to diet drinks initially, then still working to cut back on soda and to choose healthier snacks. Wegovy dose titration panel sent to her pharmacy with below titration schedule as tolerated:  - Month 1: Inject Wegovy 0.25mg  subcutaneously once weekly for 4 weeks - Month 2: Inject ZOXWRU 0.5mg  subcutaneously once weekly for 4 weeks - Month 3: Inject EAVWUJ 1mg  subcutaneously once weekly for 4 weeks - Month 4: Inject WJXBJY  1.7mg  subcutaneously once weekly for 4 weeks - Month 5: Inject NWGNFA 2.4mg  subcutaneously once weekly- this is the maintenance dose  Follow up in 1 month via telephone for tolerability update and dose titration.  Wendy [name], PharmD, BCACP, CPP [facility] [address], [place], [place] [address] Phone: [phone]; Fax: [phone] 02/19/2023 2:16 PM

## 2023-02-19 NOTE — Patient Instructions (Signed)
VHQION Counseling Points This medication reduces your appetite and may make you feel fuller longer.  Stop eating when your body tells you that you are full. This will likely happen sooner than you are used to. Fried/greasy food and sweets may upset your stomach - minimize these as much as possible. Store your medication in the fridge until you are ready to use it. Inject your medication in the fatty tissue of your lower abdominal area (2 inches away from belly button) or upper outer thigh. Rotate injection sites. Common side effects include: nausea, diarrhea/constipation, and heartburn, and are more likely to occur if you overeat. Stop your injection for 7 days prior to surgical procedures requiring anesthesia.  Dosing schedule: - Month 1: Inject 0.25mg  subcutaneously once weekly for 4 weeks - Month 2: Inject 0.5mg  subcutaneously once weekly for 4 weeks - Month 3: Inject 1mg  subcutaneously once weekly for 4 weeks - Month 4: Inject 1.7mg  subcutaneously once weekly for 4 weeks - Month 5: Inject 2.4mg  subcutaneously once weekly- this is the maintenance dose  Cut back on soda and gatorade - start by changing to diet! Increase activity - goal of 150 minutes per week I'll call you monthly to follow up with tolerability and dose increase  Tips for success: Write down the reasons why you want to lose weight and post it in a place where you'll see it often.  Start small and work your way up. Keep in mind that it takes time to achieve goals, and small steps add up.  Any additional movements help to burn calories. Taking the stairs rather than the elevator and parking at the far end of your parking lot are easy ways to start. Brisk walking for at least 30 minutes 4 or more days of the week is an excellent goal to work toward  Understanding what it means to feel full: Did you know that it can take 15 minutes or more for your brain to receive the message that you've eaten? That means that, if you eat  less food, but consume it slower, you may still feel satisfied.  Eating a lot of fruits and vegetables can also help you feel fuller.  Eat off of smaller plates so that moderate portions don't seem too small  Tips for living a healthier life     Building a Healthy and Balanced Diet Make most of your meal vegetables and fruits -  of your plate. Aim for color and variety, and remember that potatoes don't count as vegetables on the Healthy Eating Plate because of their negative impact on blood sugar.  Go for whole grains -  of your plate. Whole and intact grains--whole wheat, barley, wheat berries, quinoa, oats, brown rice, and foods made with them, such as whole wheat pasta--have a milder effect on blood sugar and insulin than white bread, white rice, and other refined grains.  Protein power -  of your plate. Fish, poultry, beans, and nuts are all healthy, versatile protein sources--they can be mixed into salads, and pair well with vegetables on a plate. Limit red meat, and avoid processed meats such as bacon and sausage.  Healthy plant oils - in moderation. Choose healthy vegetable oils like olive, canola, soy, corn, sunflower, peanut, and others, and avoid partially hydrogenated oils, which contain unhealthy trans fats. Remember that low-fat does not mean "healthy."  Drink water, coffee, or tea. Skip sugary drinks, limit milk and dairy products to one to two servings per day, and limit juice to a small  glass per day.  Stay active. The red figure running across the Healthy Eating Plate's placemat is a reminder that staying active is also important in weight control.  The main message of the Healthy Eating Plate is to focus on diet quality:  The type of carbohydrate in the diet is more important than the amount of carbohydrate in the diet, because some sources of carbohydrate--like vegetables (other than potatoes), fruits, whole grains, and beans--are healthier than others. The  Healthy Eating Plate also advises consumers to avoid sugary beverages, a major source of calories--usually with little nutritional value--in the American diet. The Healthy Eating Plate encourages consumers to use healthy oils, and it does not set a maximum on the percentage of calories people should get each day from healthy sources of fat. In this way, the Healthy Eating Plate recommends the opposite of the low-fat message promoted for decades by the USDA.  [employer]  SUGAR  Sugar is a huge problem in the modern day diet. Sugar is a big contributor to heart disease, diabetes, high triglyceride levels, fatty liver disease and obesity. Sugar is hidden in almost all packaged foods/beverages. Added sugar is extra sugar that is added beyond what is naturally found and has no nutritional benefit for your body. The American Heart Association recommends limiting added sugars to no more than 25g for women and 36 grams for men per day. There are many names for sugar including maltose, sucrose (names ending in "ose"), high fructose corn syrup, molasses, cane sugar, corn sweetener, raw sugar, syrup, honey or fruit juice concentrate.   One of the best ways to limit your added sugars is to stop drinking sweetened beverages such as soda, sweet tea, and fruit juice.  There is 65g of added sugars in one 20oz bottle of Coke! That is equal to 7.5 donuts.   Pay attention and read all nutrition facts labels. Below is an examples of a nutrition facts label. The #1 is showing you the total sugars where the # 2 is showing you the added sugars. This one serving has almost the max amount of added sugars per day!   EXERCISE  Exercise is good. We've all heard that. In an ideal world, we would all have time and resources to get plenty of it. When you are active, your heart pumps more efficiently and you will feel better.  Multiple studies show that even walking  regularly has benefits that include living a longer life. The American Heart Association recommends 150 minutes per week of exercise (30 minutes per day most days of the week). You can do this in any increment you wish. Nine or more 10-minute walks count. So does an hour-long exercise class. Break the time apart into what will work in your life. Some of the best things you can do include walking briskly, jogging, cycling or swimming laps. Not everyone is ready to "exercise." Sometimes we need to start with just getting active. Here are some easy ways to be more active throughout the day:  Take the stairs instead of the elevator  Go for a 10-15 minute walk during your lunch break (find a friend to make it more enjoyable)  When shopping, park at the back of the parking lot  If you take public transportation, get off one stop early and walk the extra distance  Pace around while making phone calls  Check with your doctor if you aren't sure what your limitations may be. Always remember to drink plenty of water when doing  any type of exercise. Don't feel like a failure if you're not getting the 90-150 minutes per week. If you started by being a couch potato, then just a 10-minute walk each day is a huge improvement. Start with little victories and work your way up.   HEALTHY EATING TIPS              Plan ahead: make a menu of the meals for a week then create a grocery list to go with that menu. Consider meals that easily stretch into a night of leftovers, such as stews or casseroles. Or consider making two of your favorite meal and put one in the freezer for another night. Try a night or two each week that is "meatless" or "no cook" such as salads. When you get home from the grocery store wash and prepare your vegetables and fruits. Then when you need them they are ready to go.   Tips for going to the grocery store:  Buy store or generic brands  Check the weekly ad from your store on-line or in their  in-store flyer  Look at the unit price on the shelf tag to compare/contrast the costs of different items  Buy fruits/vegetables in season  Carrots, bananas and apples are low-cost, naturally healthy items  If meats or frozen vegetables are on sale, buy some extras and put in your freezer  Limit buying prepared or "ready to eat" items, even if they are pre-made salads or fruit snacks  Do not shop when you're hungry  Foods at eye level tend to be more expensive. Look on the high and low shelves for deals.  Consider shopping at the farmer's market for fresh foods in season.  Avoid the cookie and chip aisles (these are expensive, high in calories and low in nutritional value). Shop on the outside of the grocery store.  Healthy food preparations:  If you can't get lean hamburger, be sure to drain the fat when cooking  Steam, saut (in olive oil), grill or bake foods  Experiment with different seasonings to avoid adding salt to your foods. Kosher salt, sea salt and Himalayan salt are all still salt and should be avoided. Try seasoning food with onion, garlic, thyme, rosemary, basil ect. Onion powder or garlic powder is ok. Avoid if it says salt (ie garlic salt).

## 2023-02-21 ENCOUNTER — Ambulatory Visit
Admission: RE | Admit: 2023-02-21 | Discharge: 2023-02-21 | Disposition: A | Discharge status: Home / Self Care | Payer: 59 | Source: Ambulatory Visit | Attending: Internal Medicine | Admitting: Internal Medicine

## 2023-02-21 ENCOUNTER — Encounter (HOSPITAL_COMMUNITY): Payer: Self-pay | Admitting: Gastroenterology

## 2023-02-21 DIAGNOSIS — Z122 Encounter for screening for malignant neoplasm of respiratory organs: Secondary | ICD-10-CM

## 2023-02-21 DIAGNOSIS — F17211 Nicotine dependence, cigarettes, in remission: Secondary | ICD-10-CM | POA: Diagnosis not present

## 2023-02-22 DIAGNOSIS — Z1231 Encounter for screening mammogram for malignant neoplasm of breast: Secondary | ICD-10-CM | POA: Diagnosis not present

## 2023-02-23 DIAGNOSIS — J439 Emphysema, unspecified: Secondary | ICD-10-CM | POA: Diagnosis not present

## 2023-02-23 DIAGNOSIS — Z79899 Other long term (current) drug therapy: Secondary | ICD-10-CM | POA: Diagnosis not present

## 2023-02-23 DIAGNOSIS — I10 Essential (primary) hypertension: Secondary | ICD-10-CM | POA: Diagnosis not present

## 2023-02-25 ENCOUNTER — Ambulatory Visit (INDEPENDENT_AMBULATORY_CARE_PROVIDER_SITE_OTHER): Payer: 59

## 2023-02-25 DIAGNOSIS — G4733 Obstructive sleep apnea (adult) (pediatric): Secondary | ICD-10-CM | POA: Diagnosis not present

## 2023-03-06 DIAGNOSIS — H40013 Open angle with borderline findings, low risk, bilateral: Secondary | ICD-10-CM | POA: Diagnosis not present

## 2023-03-12 ENCOUNTER — Ambulatory Visit: Payer: 59 | Admitting: Internal Medicine

## 2023-03-13 ENCOUNTER — Other Ambulatory Visit (HOSPITAL_COMMUNITY): Payer: Self-pay

## 2023-03-19 ENCOUNTER — Telehealth: Payer: Self-pay | Admitting: Pharmacist

## 2023-03-19 NOTE — Telephone Encounter (Signed)
Pt returned call, not noticing any change in her appetite, tolerating med well. Pt reports cutting back on soda and not drinking much now, no Gatorade, drinking more water with flavor packs. Cutting back on snacking at night too.

## 2023-03-19 NOTE — Telephone Encounter (Signed)
Called pt to follow up with [facility] tolerability and left message.  Already has dose titration panel send to her pharmacy for next dose up (0.5mg  if tolerated starting dose well).  Also need dietary updates, pt was drinking 2L regular pepsi and regular gatorade plus snacking on chips and cookies at night when I saw her last month.

## 2023-04-16 ENCOUNTER — Other Ambulatory Visit (HOSPITAL_COMMUNITY): Payer: Self-pay

## 2023-04-16 ENCOUNTER — Telehealth: Payer: Self-pay | Admitting: Pharmacist

## 2023-04-16 NOTE — Telephone Encounter (Signed)
Called pt to follow up with [facility] 0.5mg  tolerability. No side effects, lost 10 lbs so far, walking and riding her bike more. Picking up 1mg  dose later today.

## 2023-04-23 ENCOUNTER — Encounter: Payer: Self-pay | Admitting: Internal Medicine

## 2023-04-23 ENCOUNTER — Ambulatory Visit (INDEPENDENT_AMBULATORY_CARE_PROVIDER_SITE_OTHER): Payer: 59 | Admitting: Internal Medicine

## 2023-04-23 VITALS — BP 100/60 | HR 78 | Temp 97.5°F | Ht 59.0 in | Wt 203.6 lb

## 2023-04-23 DIAGNOSIS — J4489 Other specified chronic obstructive pulmonary disease: Secondary | ICD-10-CM | POA: Diagnosis not present

## 2023-04-23 DIAGNOSIS — G4733 Obstructive sleep apnea (adult) (pediatric): Secondary | ICD-10-CM | POA: Diagnosis not present

## 2023-04-23 DIAGNOSIS — Z122 Encounter for screening for malignant neoplasm of respiratory organs: Secondary | ICD-10-CM | POA: Diagnosis not present

## 2023-04-23 DIAGNOSIS — J439 Emphysema, unspecified: Secondary | ICD-10-CM

## 2023-04-23 NOTE — Progress Notes (Signed)
Wendy [name]    [id number]    Mar 13, 1966  Primary Care Physician:Varadarajan, Soyla Murphy, MD Date of Appointment: 04/23/2023 Established Patient Visit  Chief complaint:   Chief Complaint  Patient presents with   Follow-up    Has had HST.  Missed PFT, will get her rescheduled after visit today.     HPI: Wendy [name] is a 57 y.o. woman with emphysema, mild aortic insufficiency, and suspected OSA.   Interval Updates: Here for follow up. She missed her PFTS. She has been started on wegovy and has lost some weight.  She was started on symbicort during the last visit. Symbicort seems to be helping. Minimal albuterol.  Had HST and AHI 4% was 16.7 Still having dyspnea with exertion, climbing stairs.   I have reviewed the patient's family social and past medical history and updated as appropriate.   Past Medical History:  Diagnosis Date   Anxiety    Asthma    daily and prn inhalers   Cervical disc disease    decreased range of motion   Chronic pain    shoulders, neck, lower back   Coronary artery disease    Deformity, hand    left   Depression    Eczema    Endometriosis    Lupron injection Q 3 mos.   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   Urticaria    Wears partial dentures    upper partial    Past Surgical History:  Procedure Laterality Date   ANKLE RECONSTRUCTION  02/01/2012   Procedure: RECONSTRUCTION ANKLE;  Surgeon: Toni Arthurs, MD;  Location: [facility];  Service: Orthopedics;  Laterality: Right;  Excision of right calcaneonavicular coalition with autologus fat graft interposition   BRACHIAL PLEXUS EXPLORATION     CARDIAC CATHETERIZATION     COLONOSCOPY WITH PROPOFOL N/A 02/16/2023   Procedure: COLONOSCOPY WITH PROPOFOL;   Surgeon: Jeani Hawking, MD;  Location: [facility];  Service: Gastroenterology;  Laterality: N/A;   CORONARY STENT INTERVENTION N/A 07/15/2020   Procedure: CORONARY STENT INTERVENTION;  Surgeon: Corky Crafts, MD;  Location: [facility] INVASIVE CV LAB;  Service: Cardiovascular;  Laterality: N/A;   DIAGNOSTIC LAPAROSCOPY  10/02/2008   peritoneal bx.   LEFT HEART CATH AND CORONARY ANGIOGRAPHY N/A 07/15/2020   Procedure: LEFT HEART CATH AND CORONARY ANGIOGRAPHY;  Surgeon: Corky Crafts, MD;  Location: [facility] INVASIVE CV LAB;  Service: Cardiovascular;  Laterality: N/A;   MULTIPLE TOOTH EXTRACTIONS     upper teeth and wisdom teeth   OVARIAN CYST REMOVAL  2000   RIB RESECTION     left - cervical rib removal   SHOULDER ARTHROSCOPY W/ ROTATOR CUFF REPAIR  07/2011   right   SHOULDER SURGERY     left   TOOTH EXTRACTION     x 1    Family History  Problem Relation Age of Onset   Heart failure Mother    Cancer Mother    COPD Mother    Dementia Father    Stroke Father    CAD Father    Heart failure Sister     Social History   Occupational History   Not on file  Tobacco Use  Smoking status: Former    Current packs/day: 0.00    Average packs/day: 1 pack/day for 25.0 years (25.0 ttl pk-yrs)    Types: Cigarettes    Start date: 07/15/1995    Quit date: 07/14/2020    Years since quitting: 2.7    Passive exposure: Never   Smokeless tobacco: Never  Vaping Use   Vaping status: Never Used  Substance and Sexual Activity   Alcohol use: No    Alcohol/week: 0.0 standard drinks of alcohol    Comment: occasional   Drug use: No   Sexual activity: Yes    Partners: Male    Birth control/protection: None     Physical Exam: Blood pressure 100/60, pulse 78, temperature (!) 97.5 F (36.4 C), temperature source Oral, height 4\' 11"  (1.499 m), weight 203 lb 9.6 oz (92.4 kg), last menstrual period 07/12/2016, SpO2 100%.  Gen:      No acute distress ENT:  no nasal polyps, mucus membranes  moist Lungs:    No increased respiratory effort, symmetric chest wall excursion, clear to auscultation bilaterally, no wheezes or crackles CV:         Regular rate and rhythm; no murmurs, rubs, or gallops.  No pedal edema   Data Reviewed: Imaging: I have personally reviewed the CT Chest lung cancer screenig June 2024 - no concerning lung nodules/masses.   PFTs:      No data to display         I have personally reviewed the patient's PFTs and   Labs:  Immunization status: Immunization History  Administered Date(s) Administered   Influenza,inj,Quad PF,6+ Mos 06/15/2014   MMR 04/15/2002   Td 02/24/2002    External Records Personally Reviewed: cardiology, GI  Assessment:  Shortness of breath, multifactorial from: COPD Moderate OSA Obesity CAD  Plan/Recommendations: Your sleep study shows moderate sleep apnea. I am recommending CPAP therapy.  I will prescribe this today and will be delivered to your home through medical equipment company.  They will work with you to find a mask best fit.  It can take some getting used to but I do think it will help your fatigue and several other medical conditions including heart disease, shortness of breath, energy level.  I need to see you between 31-90 days of starting CPAP.   Continue the Symbicort for now.  We can get your PFTs rescheduled to determine severity of your COPD.  You can continue albuterol inhaler as needed, you can take this before activity or exertion and walking to see if it helps you walk further.  Call me sooner if issues or concerns with your breathing.  Your CT chest for lung cancer screening was reassuring.  You are next due for a scan in June 2025.   Return to Care: Return in about 4 months (around 08/23/2023).   Durel Salts, MD Pulmonary and Critical Care Medicine [facility] Office:[phone]

## 2023-04-23 NOTE — Patient Instructions (Addendum)
Please schedule follow up scheduled with myself in 4 months.  If my schedule is not open yet, we will contact you with a reminder closer to that time. Please call [phone] if you haven't heard from [place] a month before.   Your sleep study shows moderate sleep apnea. I am recommending CPAP therapy.  I will prescribe this today and will be delivered to your home through medical equipment company.  They will work with you to find a mask best fit.  It can take some getting used to but I do think it will help your fatigue and several other medical conditions including heart disease, shortness of breath, energy level.  I need to see you between 31-90 days of starting CPAP.   Continue the Symbicort for now.  We can get your PFTs rescheduled to determine severity of your COPD.  You can continue albuterol inhaler as needed, you can take this before activity or exertion and walking to see if it helps you walk further.  Call me sooner if issues or concerns with your breathing.  Your CT chest for lung cancer screening was reassuring.  You are next due for a scan in June 2025.

## 2023-04-24 ENCOUNTER — Ambulatory Visit: Payer: Medicaid Other | Admitting: Internal Medicine

## 2023-05-02 ENCOUNTER — Other Ambulatory Visit: Payer: Self-pay

## 2023-05-02 ENCOUNTER — Ambulatory Visit (INDEPENDENT_AMBULATORY_CARE_PROVIDER_SITE_OTHER): Payer: 59 | Admitting: Internal Medicine

## 2023-05-02 ENCOUNTER — Encounter: Payer: Self-pay | Admitting: Internal Medicine

## 2023-05-02 VITALS — BP 130/70 | HR 78 | Temp 97.7°F | Resp 16 | Ht 59.0 in | Wt 201.0 lb

## 2023-05-02 DIAGNOSIS — K219 Gastro-esophageal reflux disease without esophagitis: Secondary | ICD-10-CM

## 2023-05-02 DIAGNOSIS — T63481A Toxic effect of venom of other arthropod, accidental (unintentional), initial encounter: Secondary | ICD-10-CM

## 2023-05-02 DIAGNOSIS — L501 Idiopathic urticaria: Secondary | ICD-10-CM

## 2023-05-02 DIAGNOSIS — J310 Chronic rhinitis: Secondary | ICD-10-CM

## 2023-05-02 DIAGNOSIS — J438 Other emphysema: Secondary | ICD-10-CM

## 2023-05-02 DIAGNOSIS — R239 Unspecified skin changes: Secondary | ICD-10-CM | POA: Diagnosis not present

## 2023-05-02 MED ORDER — PANTOPRAZOLE SODIUM 20 MG PO TBEC: 20.0000 mg | DELAYED_RELEASE_TABLET | Freq: Every day | ORAL | 5 refills | Status: DC

## 2023-05-02 MED ORDER — FEXOFENADINE HCL 180 MG PO TABS: 180.0000 mg | ORAL_TABLET | ORAL | 5 refills | Status: DC | PRN

## 2023-05-02 MED ORDER — FLUTICASONE PROPIONATE 50 MCG/ACT NA SUSP: 2.0000 | Freq: Every day | NASAL | 5 refills | Status: DC

## 2023-05-02 MED ORDER — AZELASTINE HCL 0.1 % NA SOLN: 1.0000 | Freq: Two times a day (BID) | NASAL | 5 refills | Status: DC

## 2023-05-02 MED ORDER — EPINEPHRINE 0.3 MG/0.3ML IJ SOAJ: 0.3000 mg | INTRAMUSCULAR | 1 refills | Status: DC | PRN

## 2023-05-02 NOTE — Progress Notes (Signed)
FOLLOW UP Date of Service/Encounter:  05/02/23   Subjective:  Wendy [name] (DOB: 1966-06-06) is a 57 y.o. female [facility] for follow up for idiopathic urticaria, chronic rhinitis, GERD, stinging insect reaction.   History obtained from: chart review and patient. Last visit was with me and was doing well on Flonase, Azelastine, Allegra, Protonix.   Rhinitis: Doing well overall.  Does have some congestion, runny nose in Spring/Fall but manageable.  On Flonase and Azelastine.  Reports being diagnosed with OSA recently- getting a CPAP with [facility] for weight loss- has lost 8 lbs. Also has COPD an dis followed by Pulm Dr Celine Mans- doing well on Symbicort.  They are planning to get PFTs soon.    GERD: Doing okay, sometimes has burping.  Taking Protonix daily which does help control it.   Hives: Doing much better but still has some outbreaks about once or twice a month.  Taking Allegra daily.  Breakouts are usually short lived and small areas.   Insect Allergy: Reports being stung by a bee before and developing hives.  Was given Epipen and would like a refill.  Never had any other respiratory, cardiac or GI symptoms.  Past Medical History: Past Medical History:  Diagnosis Date   Anxiety    Asthma    daily and prn inhalers   Cervical disc disease    decreased range of motion   Chronic pain    shoulders, neck, lower back   Coronary artery disease    Deformity, hand    left   Depression    Eczema    Endometriosis    Lupron injection Q 3 mos.   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 Physical Exam: GEN: alert, well developed HEENT: clear conjunctiva, TM grey and translucent, nose without inferior turbinate hypertrophy, pink nasal mucosa,no rhinorrhea,no cobblestoning HEART: regular rate and rhythm, no murmur LUNGS: clear to auscultation bilaterally, no coughing, unlabored respiration SKIN: no rashes or lesions  Spirometry:  Tracings reviewed. Her effort: Good reproducible efforts. FVC: 1.8L FEV1: 1.49L, 80% predicted FEV1/FVC ratio: 83% Interpretation: Spirometry consistent with normal pattern.  Please see scanned spirometry results for details.  Skin Testing:  Skin prick testing was placed, which includes aeroallergens/foods, histamine control, and saline control.  Verbal consent was obtained prior to placing test.  Patient tolerated procedure well.  Allergy testing results were read and interpreted by myself, documented by clinical staff. Adequate positive and negative control.  Positive results to:  Results discussed with patient/family.    Assessment:   1. Chronic rhinitis   2. Idiopathic urticaria   3. Gastroesophageal reflux disease, unspecified whether esophagitis present   4. Other emphysema (HCC)   5.  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  Use distilled water.   - Use Flonase 2 sprays each nostril daily. Aim upward and outward. - Use Azelastine 1-2 sprays each nostril twice daily as needed. Aim upward and outward.  GERD: - Controlled  -Continue Protonix 20mg  every other day or daily depending on symptoms.   -Avoid lying down for at least two hours after a meal or after drinking acidic beverages, like soda, or other caffeinated beverages. This can help to prevent stomach contents from flowing back into the esophagus. -Keep your head elevated while you sleep. Using an extra pillow or two can also help to prevent reflux. -Eat smaller and more frequent meals each day instead of a few large meals. This promotes digestion and can aid in preventing heartburn. -Wear loose-fitting clothes to ease pressure on the stomach, which can worsen heartburn and reflux. -Reduce excess weight around the midsection. This can ease pressure on the stomach. Such pressure can force some stomach contents back up the esophagus.   Idiopathic Urticaria (Hives) - Controlled  - At this time etiology of hives and swelling is unknown. Hives can be caused by a variety of different triggers including illness/infection, exercise, pressure, vibrations, extremes of temperature to name a few however majority of the time there is no identifiable trigger.  -Continue Allegra 180mg  daily.   -If no improvement in 1 week, increase to Allegra 180mg  twice daily.   -If no improvement in 1 week, add Pepcid 20mg  twice daily and continue Allegra 180mg  twice daily.   Large Local Reaction to Stinging Insect : - based on today's history, your previous reaction is consistent with a large local or local reaction - your risk of a severe systemic reaction remains low and we do not need to do anything different at this time - if you are stung again and have other symptoms besides hives or localized swelling, please let us know as that would change our management - for SKIN (hives/swelling) only reaction, okay to take Benadryl 25mg  capsules every 6 hours as needed - for SKIN + ANY additional symptoms, OR IF concern for LIFE THREATENING reaction = Epipen Autoinjector EpiPen 0.3 mg.  COPD - Followed by Pulm, on Symbicort. Spirometry today without  obstruction.   Return in about 6 months (around 11/02/2023).  Alesia Morin, MD [facility]

## 2023-05-02 NOTE — Patient Instructions (Addendum)
Chronic Rhinitis: - Positive skin test 08/2022: none - Use nasal saline rinses before nose sprays such as with Neilmed Sinus Rinse.  Use distilled water.   - Use Flonase 2 sprays each nostril daily. Aim upward and outward. - Use Azelastine 1-2 sprays each nostril twice daily as needed. Aim upward and outward.  GERD: -Continue Protonix 20mg  every other day or daily depending on symptoms.  -Avoid lying down for at least two hours after a meal or after drinking acidic beverages, like soda, or other caffeinated beverages. This can help to prevent stomach contents from flowing back into the esophagus. -Keep your head elevated while you sleep. Using an extra pillow or two can also help to prevent reflux. -Eat smaller and more frequent meals each day instead of a few large meals. This promotes digestion and can aid in preventing heartburn. -Wear loose-fitting clothes to ease pressure on the stomach, which can worsen heartburn and reflux. -Reduce excess weight around the midsection. This can ease pressure on the stomach. Such pressure can force some stomach contents back up the esophagus.   Idiopathic Urticaria (Hives) - At this time etiology of hives and swelling is unknown. Hives can be caused by a variety of different triggers including illness/infection, exercise, pressure, vibrations, extremes of temperature to name a few however majority of the time there is no identifiable trigger.  -Continue Allegra 180mg  daily.   -If no improvement in 1 week, increase to Allegra 180mg  twice daily.   -If no improvement in 1 week, add Pepcid 20mg  twice daily and continue Allegra 180mg  twice daily.   Large Local Reaction to Stinging Insect : - based on today's history, your previous reaction is consistent with a large local or local reaction - your risk of a severe systemic reaction remains low and we do not need to do anything different at this time - if you are stung again and have other symptoms besides  hives or localized swelling, please let us know as that would change our management - for SKIN (hives/swelling) only reaction, okay to take Benadryl 25mg  capsules every 6 hours as needed - for SKIN + ANY additional symptoms, OR IF concern for LIFE THREATENING reaction = Epipen Autoinjector EpiPen 0.3 mg.

## 2023-05-09 ENCOUNTER — Other Ambulatory Visit (HOSPITAL_COMMUNITY): Payer: Self-pay

## 2023-05-10 ENCOUNTER — Telehealth: Payer: Self-pay | Admitting: Internal Medicine

## 2023-05-10 DIAGNOSIS — M7701 Medial epicondylitis, right elbow: Secondary | ICD-10-CM | POA: Diagnosis not present

## 2023-05-10 NOTE — Telephone Encounter (Signed)
Brad with the main office of [employer] has access to Epic and he should be able to pull any documents needed

## 2023-05-18 ENCOUNTER — Other Ambulatory Visit (HOSPITAL_COMMUNITY): Payer: Self-pay

## 2023-05-18 DIAGNOSIS — M545 Low back pain, unspecified: Secondary | ICD-10-CM | POA: Diagnosis not present

## 2023-05-18 DIAGNOSIS — M5136 Other intervertebral disc degeneration, lumbar region: Secondary | ICD-10-CM | POA: Diagnosis not present

## 2023-05-18 DIAGNOSIS — M47816 Spondylosis without myelopathy or radiculopathy, lumbar region: Secondary | ICD-10-CM | POA: Diagnosis not present

## 2023-05-18 MED ORDER — CYCLOBENZAPRINE HCL 10 MG PO TABS
10.0000 mg | ORAL_TABLET | Freq: Three times a day (TID) | ORAL | 0 refills | Status: DC | PRN
  Filled 2023-05-18: qty 15, 5d supply, fill #0

## 2023-05-18 MED ORDER — HYDROCODONE-ACETAMINOPHEN 5-325 MG PO TABS
1.0000 | ORAL_TABLET | Freq: Three times a day (TID) | ORAL | 0 refills | Status: DC | PRN
  Filled 2023-05-18: qty 9, 3d supply, fill #0

## 2023-05-18 MED ORDER — PREDNISONE 20 MG PO TABS
40.0000 mg | ORAL_TABLET | Freq: Every day | ORAL | 0 refills | Status: DC
  Filled 2023-05-18: qty 10, 5d supply, fill #0

## 2023-05-21 DIAGNOSIS — G4733 Obstructive sleep apnea (adult) (pediatric): Secondary | ICD-10-CM | POA: Diagnosis not present

## 2023-05-24 ENCOUNTER — Telehealth: Payer: Self-pay | Admitting: Internal Medicine

## 2023-05-24 DIAGNOSIS — H35033 Hypertensive retinopathy, bilateral: Secondary | ICD-10-CM | POA: Diagnosis not present

## 2023-05-24 NOTE — Telephone Encounter (Signed)
Patient needs follow up for cpap with me between 06-21-23 and 08-19-23

## 2023-06-01 ENCOUNTER — Encounter: Payer: Self-pay | Admitting: Internal Medicine

## 2023-06-11 ENCOUNTER — Other Ambulatory Visit (HOSPITAL_COMMUNITY): Payer: Self-pay

## 2023-06-11 ENCOUNTER — Other Ambulatory Visit: Payer: Self-pay | Admitting: Internal Medicine

## 2023-06-11 DIAGNOSIS — M5136 Other intervertebral disc degeneration, lumbar region: Secondary | ICD-10-CM | POA: Diagnosis not present

## 2023-06-11 DIAGNOSIS — I10 Essential (primary) hypertension: Secondary | ICD-10-CM | POA: Diagnosis not present

## 2023-06-11 DIAGNOSIS — R200 Anesthesia of skin: Secondary | ICD-10-CM | POA: Diagnosis not present

## 2023-06-13 ENCOUNTER — Other Ambulatory Visit (HOSPITAL_COMMUNITY): Payer: Self-pay

## 2023-06-13 MED ORDER — ROSUVASTATIN CALCIUM 40 MG PO TABS
40.0000 mg | ORAL_TABLET | Freq: Every day | ORAL | 2 refills | Status: DC
  Filled 2023-06-13: qty 90, 90d supply, fill #0

## 2023-06-19 ENCOUNTER — Other Ambulatory Visit (HOSPITAL_COMMUNITY): Payer: Self-pay

## 2023-06-20 ENCOUNTER — Other Ambulatory Visit (HOSPITAL_COMMUNITY): Payer: Self-pay

## 2023-06-20 DIAGNOSIS — G4733 Obstructive sleep apnea (adult) (pediatric): Secondary | ICD-10-CM | POA: Diagnosis not present

## 2023-06-20 MED ORDER — BUDESONIDE-FORMOTEROL FUMARATE 80-4.5 MCG/ACT IN AERO
2.0000 | INHALATION_SPRAY | Freq: Every day | RESPIRATORY_TRACT | 0 refills | Status: DC
  Filled 2023-06-20: qty 10.2, 30d supply, fill #0
  Filled 2023-07-03: qty 10.2, 60d supply, fill #0

## 2023-07-02 ENCOUNTER — Other Ambulatory Visit (HOSPITAL_COMMUNITY): Payer: Self-pay

## 2023-07-03 ENCOUNTER — Other Ambulatory Visit (HOSPITAL_COMMUNITY): Payer: Self-pay

## 2023-07-03 DIAGNOSIS — G8929 Other chronic pain: Secondary | ICD-10-CM | POA: Diagnosis not present

## 2023-07-03 DIAGNOSIS — M5441 Lumbago with sciatica, right side: Secondary | ICD-10-CM | POA: Diagnosis not present

## 2023-07-04 ENCOUNTER — Other Ambulatory Visit: Payer: Self-pay | Admitting: Neurosurgery

## 2023-07-04 DIAGNOSIS — G8929 Other chronic pain: Secondary | ICD-10-CM

## 2023-07-05 ENCOUNTER — Ambulatory Visit
Admission: RE | Admit: 2023-07-05 | Discharge: 2023-07-05 | Disposition: A | Discharge status: Home / Self Care | Payer: 59 | Source: Ambulatory Visit | Attending: Neurosurgery | Admitting: Neurosurgery

## 2023-07-05 DIAGNOSIS — M47816 Spondylosis without myelopathy or radiculopathy, lumbar region: Secondary | ICD-10-CM | POA: Diagnosis not present

## 2023-07-05 DIAGNOSIS — G8929 Other chronic pain: Secondary | ICD-10-CM

## 2023-07-18 ENCOUNTER — Other Ambulatory Visit (HOSPITAL_COMMUNITY): Payer: Self-pay

## 2023-07-19 ENCOUNTER — Encounter: Payer: Self-pay | Admitting: Internal Medicine

## 2023-07-19 ENCOUNTER — Encounter: Payer: Self-pay | Admitting: Pharmacist

## 2023-07-20 ENCOUNTER — Other Ambulatory Visit (HOSPITAL_COMMUNITY): Payer: Self-pay

## 2023-07-20 MED ORDER — WEGOVY 1.7 MG/0.75ML ~~LOC~~ SOAJ
1.7000 mg | SUBCUTANEOUS | 5 refills | Status: DC
Start: 2023-07-20 — End: 2023-10-16
  Filled 2023-07-20: qty 3, 28d supply, fill #0
  Filled 2023-08-20: qty 3, 28d supply, fill #1
  Filled 2023-10-01 (×2): qty 3, 28d supply, fill #2

## 2023-07-20 NOTE — Telephone Encounter (Signed)
Called and spoke with patient. Has not felt well since starting the [facility] 2.4mg . Next dose is due Monday. Requests to go back down to 1.7mg . Advised that was a good idea but she should still skip Mondays dose if she feels unwell. Patient voiced understanding.

## 2023-07-26 ENCOUNTER — Other Ambulatory Visit (HOSPITAL_COMMUNITY): Payer: Self-pay

## 2023-07-26 DIAGNOSIS — M545 Low back pain, unspecified: Secondary | ICD-10-CM | POA: Diagnosis not present

## 2023-07-26 DIAGNOSIS — G8929 Other chronic pain: Secondary | ICD-10-CM | POA: Diagnosis not present

## 2023-07-26 DIAGNOSIS — T63421A Toxic effect of venom of ants, accidental (unintentional), initial encounter: Secondary | ICD-10-CM | POA: Diagnosis not present

## 2023-07-26 DIAGNOSIS — M7989 Other specified soft tissue disorders: Secondary | ICD-10-CM | POA: Diagnosis not present

## 2023-07-26 MED ORDER — CYCLOBENZAPRINE HCL 10 MG PO TABS
10.0000 mg | ORAL_TABLET | Freq: Every evening | ORAL | 0 refills | Status: DC | PRN
  Filled 2023-07-26: qty 30, 30d supply, fill #0

## 2023-07-27 ENCOUNTER — Other Ambulatory Visit (HOSPITAL_COMMUNITY): Payer: Self-pay

## 2023-08-02 ENCOUNTER — Other Ambulatory Visit (HOSPITAL_COMMUNITY): Payer: Self-pay

## 2023-08-02 ENCOUNTER — Ambulatory Visit: Payer: 59 | Admitting: Internal Medicine

## 2023-08-02 ENCOUNTER — Encounter: Payer: Self-pay | Admitting: Internal Medicine

## 2023-08-02 VITALS — BP 114/64 | HR 80 | Temp 98.0°F | Ht 59.0 in | Wt 186.0 lb

## 2023-08-02 DIAGNOSIS — Z122 Encounter for screening for malignant neoplasm of respiratory organs: Secondary | ICD-10-CM

## 2023-08-02 DIAGNOSIS — R0602 Shortness of breath: Secondary | ICD-10-CM

## 2023-08-02 DIAGNOSIS — G4733 Obstructive sleep apnea (adult) (pediatric): Secondary | ICD-10-CM

## 2023-08-02 DIAGNOSIS — J4489 Other specified chronic obstructive pulmonary disease: Secondary | ICD-10-CM | POA: Diagnosis not present

## 2023-08-02 DIAGNOSIS — J439 Emphysema, unspecified: Secondary | ICD-10-CM

## 2023-08-02 DIAGNOSIS — Z87891 Personal history of nicotine dependence: Secondary | ICD-10-CM

## 2023-08-02 MED ORDER — BUDESONIDE-FORMOTEROL FUMARATE 80-4.5 MCG/ACT IN AERO
2.0000 | INHALATION_SPRAY | Freq: Every day | RESPIRATORY_TRACT | 11 refills | Status: AC
  Filled 2023-08-02: qty 10.2, 60d supply, fill #0

## 2023-08-02 NOTE — Progress Notes (Signed)
Full PFT performed today. °

## 2023-08-02 NOTE — Patient Instructions (Addendum)
It was a pleasure to see you today!  Please schedule follow up scheduled with myself in 6 months.  If my schedule is not open yet, we will contact you with a reminder closer to that time. Please call [phone] if you haven't heard from [place] a month before, and always call us sooner if issues or concerns arise. You can also send [place] a message through MyChart, but but aware that this is not to be used for urgent issues and it may take up to 5-7 days to receive a reply. Please be aware that you will likely be able to view your results before I have a chance to respond to them. Please give [place] 5 business days to respond to any non-urgent results.    Breathing testing shows mild COPD.  Continue the symbicort 2 puffs twice daily. Continue albuterol as needed.   Continue wearing CPAP - it's working well for you.  Glad the wegovy is going well, keep up exercising.   I will see you back in June after the CT Chest for lung cancer screening.

## 2023-08-02 NOTE — Patient Instructions (Signed)
Full PFT performed today. °

## 2023-08-02 NOTE — Progress Notes (Signed)
Wendy [name]    [id number]    25-May-1966  Primary Care Physician:Varadarajan, Soyla Murphy, MD Date of Appointment: 08/07/2023 Established Patient Visit  Chief complaint:   Chief Complaint  Patient presents with   Follow-up    Doing well.  Review PFT today.     HPI: Wendy [name] is a 57 y.o. woman with copd/emphysema, mild aortic insufficiency, and OSA.   Interval Updates: Here for follow up after PFTs and starting CPAP therapy.  PFTs show moderarte restriction to ventilation with reduced dlco likely secondary to body habitus. No airflow limitation.   Still on symbicort CPAP download reviewed - 83% compliance with median pressure 7.4 cm H20 and AHI 2.3. minimal leak.  Improved symptoms of shortness of breath and fatigue.   I have reviewed the patient's family social and past medical history and updated as appropriate.   Past Medical History:  Diagnosis Date   Anxiety    Asthma    daily and prn inhalers   Cervical disc disease    decreased range of motion   Chronic pain    shoulders, neck, lower back   Coronary artery disease    Deformity, hand    left   Depression    Eczema    Endometriosis    Lupron injection Q 3 mos.   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   Urticaria    Wears partial dentures    upper partial    Past Surgical History:  Procedure Laterality Date   ANKLE RECONSTRUCTION  02/01/2012   Procedure: RECONSTRUCTION ANKLE;  Surgeon: Toni Arthurs, MD;  Location: [facility];  Service: Orthopedics;  Laterality: Right;  Excision of right calcaneonavicular coalition with autologus fat graft interposition   BRACHIAL PLEXUS EXPLORATION     CARDIAC CATHETERIZATION     COLONOSCOPY WITH PROPOFOL N/A 02/16/2023    Procedure: COLONOSCOPY WITH PROPOFOL;  Surgeon: Jeani Hawking, MD;  Location: [facility];  Service: Gastroenterology;  Laterality: N/A;   CORONARY STENT INTERVENTION N/A 07/15/2020   Procedure: CORONARY STENT INTERVENTION;  Surgeon: Corky Crafts, MD;  Location: [facility] INVAS[facility] CV LAB;  Service: Cardiovascular;  Laterality: N/A;   DIAGNOSTIC LAPAROSCOPY  10/02/2008   peritoneal bx.   LEFT HEART CATH AND CORONARY ANGIOGRAPHY N/A 07/15/2020   Procedure: LEFT HEART CATH AND CORONARY ANGIOGRAPHY;  Surgeon: Corky Crafts, MD;  Location: [facility] INVASIVE CV LAB;  Service: Cardiovascular;  Laterality: N/A;   MULTIPLE TOOTH EXTRACTIONS     upper teeth and wisdom teeth   OVARIAN CYST REMOVAL  2000   RIB RESECTION     left - cervical rib removal   SHOULDER ARTHROSCOPY W/ ROTATOR CUFF REPAIR  07/2011   right   SHOULDER SURGERY     left   TOOTH EXTRACTION     x 1    Family History  Problem Relation Age of Onset   Heart failure Mother    Cancer Mother    COPD Mother    Dementia Father    Stroke Father    CAD Father    Heart failure Sister     Social History   Occupational History   Not on file  Tobacco Use  Smoking status: Former    Current packs/day: 0.00    Average packs/day: 1 pack/day for 25.0 years (25.0 ttl pk-yrs)    Types: Cigarettes    Start date: 07/15/1995    Quit date: 07/14/2020    Years since quitting: 3.0    Passive exposure: Never   Smokeless tobacco: Never  Vaping Use   Vaping status: Never Used  Substance and Sexual Activity   Alcohol use: No    Alcohol/week: 0.0 standard drinks of alcohol    Comment: occasional   Drug use: No   Sexual activity: Yes    Partners: Male    Birth control/protection: None     Physical Exam: Blood pressure 114/64, pulse 80, temperature 98 F (36.7 C), temperature source Oral, height 4\' 11"  (1.499 m), weight 186 lb (84.4 kg), last menstrual period 07/12/2016, SpO2 99%.  Gen:      No acute distress ENT:  no nasal polyps,  mucus membranes moist Lungs:    No increased respiratory effort, symmetric chest wall excursion, clear to auscultation bilaterally, no wheezes or crackles CV:         Regular rate and rhythm; no murmurs, rubs, or gallops.  No pedal edema   Data Reviewed: Imaging: I have personally reviewed the CT Chest lung cancer screenig June 2024 - no concerning lung nodules/masses.   PFTs:      No data to display         I have personally reviewed the patient's PFTs and PFTs show moderarte restriction to ventilation with reduced dlco likely secondary to body habitus. No airflow limitation.   Labs: Lab Results  Component Value Date   WBC 9.5 11/20/2022   HGB 11.6 (L) 11/20/2022   HCT 37.4 11/20/2022   MCV 88.8 11/20/2022   PLT 270 11/20/2022   Lab Results  Component Value Date   NA 136 11/20/2022   K 3.1 (L) 11/20/2022   CO2 25 11/20/2022   GLUCOSE 129 (H) 11/20/2022   BUN 12 11/20/2022   CREATININE 0.64 11/20/2022   CALCIUM 8.3 (L) 11/20/2022   EGFR 92 01/24/2021   GFRNONAA >60 11/20/2022     Immunization status: Immunization History  Administered Date(s) Administered   Influenza,inj,Quad PF,6+ Mos 06/15/2014   MMR 04/15/2002   Td 02/24/2002    External Records Personally Reviewed: cardiology, GI  Assessment:  Shortness of breath, multifactorial from: COPD, Gold Stage 0 Moderate OSA on CPAP Obesity on GLP1 Body mass index is 37.57 kg/m. CAD  Plan/Recommendations: Breathing testing shows mild COPD.  Continue the symbicort 2 puffs twice daily. Continue albuterol as needed.   Continue wearing CPAP - it's working well for you. It is improving your fatigue, sleepiness and shortness of breath.   Glad the wegovy is going well, keep up exercising.   I will see you back in June after the CT Chest for lung cancer screening.    Return to Care: Return in about 7 months (around 03/01/2024).   Durel Salts, MD Pulmonary and Critical Care Medicine [facility] Office:[phone]

## 2023-08-07 ENCOUNTER — Encounter: Payer: Self-pay | Admitting: Internal Medicine

## 2023-08-07 ENCOUNTER — Other Ambulatory Visit (HOSPITAL_COMMUNITY): Payer: Self-pay

## 2023-08-07 DIAGNOSIS — M47816 Spondylosis without myelopathy or radiculopathy, lumbar region: Secondary | ICD-10-CM | POA: Diagnosis not present

## 2023-08-07 LAB — PULMONARY FUNCTION TEST
DL/VA % pred: 104 %
DL/VA: 4.61 ml/min/mmHg/L
DLCO cor % pred: 67 %
DLCO cor: 11.62 ml/min/mmHg
DLCO unc % pred: 67 %
DLCO unc: 11.62 ml/min/mmHg
FEF 25-75 Post: 0.85 L/s
FEF 25-75 Pre: 1.61 L/s
FEF2575-%Change-Post: -47 %
FEF2575-%Pred-Post: 38 %
FEF2575-%Pred-Pre: 73 %
FEV1-%Change-Post: -11 %
FEV1-%Pred-Post: 55 %
FEV1-%Pred-Pre: 62 %
FEV1-Post: 1.21 L
FEV1-Pre: 1.37 L
FEV1FVC-%Change-Post: 2 %
FEV1FVC-%Pred-Pre: 106 %
FEV6-%Change-Post: -13 %
FEV6-%Pred-Post: 52 %
FEV6-%Pred-Pre: 60 %
FEV6-Post: 1.41 L
FEV6-Pre: 1.63 L
FEV6FVC-%Pred-Post: 103 %
FEV6FVC-%Pred-Pre: 103 %
FVC-%Change-Post: -13 %
FVC-%Pred-Post: 50 %
FVC-%Pred-Pre: 58 %
FVC-Post: 1.41 L
FVC-Pre: 1.63 L
Post FEV1/FVC ratio: 86 %
Post FEV6/FVC ratio: 100 %
Pre FEV1/FVC ratio: 84 %
Pre FEV6/FVC Ratio: 100 %
RV % pred: 81 %
RV: 1.37 L
TLC % pred: 67 %
TLC: 2.91 L

## 2023-08-07 MED ORDER — PREGABALIN 100 MG PO CAPS
100.0000 mg | ORAL_CAPSULE | Freq: Two times a day (BID) | ORAL | 2 refills | Status: AC
  Filled 2023-08-07: qty 60, 30d supply, fill #0

## 2023-08-12 DIAGNOSIS — G4733 Obstructive sleep apnea (adult) (pediatric): Secondary | ICD-10-CM | POA: Diagnosis not present

## 2023-08-13 ENCOUNTER — Other Ambulatory Visit (HOSPITAL_COMMUNITY): Payer: Self-pay

## 2023-08-13 DIAGNOSIS — Z1231 Encounter for screening mammogram for malignant neoplasm of breast: Secondary | ICD-10-CM | POA: Diagnosis not present

## 2023-08-13 DIAGNOSIS — J439 Emphysema, unspecified: Secondary | ICD-10-CM | POA: Diagnosis not present

## 2023-08-13 DIAGNOSIS — I25119 Atherosclerotic heart disease of native coronary artery with unspecified angina pectoris: Secondary | ICD-10-CM | POA: Diagnosis not present

## 2023-08-13 DIAGNOSIS — Z87891 Personal history of nicotine dependence: Secondary | ICD-10-CM | POA: Diagnosis not present

## 2023-08-13 DIAGNOSIS — E785 Hyperlipidemia, unspecified: Secondary | ICD-10-CM | POA: Diagnosis not present

## 2023-08-13 DIAGNOSIS — I10 Essential (primary) hypertension: Secondary | ICD-10-CM | POA: Diagnosis not present

## 2023-08-13 DIAGNOSIS — Z1211 Encounter for screening for malignant neoplasm of colon: Secondary | ICD-10-CM | POA: Diagnosis not present

## 2023-08-13 DIAGNOSIS — Z0000 Encounter for general adult medical examination without abnormal findings: Secondary | ICD-10-CM | POA: Diagnosis not present

## 2023-08-13 MED ORDER — ROSUVASTATIN CALCIUM 40 MG PO TABS
40.0000 mg | ORAL_TABLET | Freq: Every day | ORAL | 5 refills | Status: DC
  Filled 2023-08-13 – 2023-11-26 (×2): qty 90, 90d supply, fill #0

## 2023-08-14 ENCOUNTER — Telehealth: Payer: Self-pay | Admitting: Internal Medicine

## 2023-08-14 NOTE — Telephone Encounter (Signed)
Called pt in regards to diltiazem.  Advised pt that medication is still on our list.  Pt reports PCP wanted to double check meds she is on.  Last BP at office was a little elevated Pt does not have readings.  Reviewed all cardiac meds pt is to take:  ASA 81 mg, diltiazem 120 mg, repatha, furosemide 20 mg PRN, imdur 60 mg, rosuvastatin 40 mg, and NTG PRN. Pt reports taking meds as prescribed.  Pt wants MD to know she now uses a CPAP.  Pt had no further questions or concerns.

## 2023-08-14 NOTE — Telephone Encounter (Signed)
  Pt c/o medication issue:  1. Name of Medication:   diltiazem (CARTIA XT) 120 MG 24 hr capsule   2. How are you currently taking this medication (dosage and times per day)?    Take 1 capsule (120 mg total) by mouth daily.    3. Are you having a reaction (difficulty breathing--STAT)? no   4. What is your medication issue?  Patient states her PCP wanted her to call to make sure she is supposed to be taking this medication for her blood pressure. Please advise.

## 2023-08-15 ENCOUNTER — Ambulatory Visit: Payer: 59 | Admitting: Nurse Practitioner

## 2023-08-20 ENCOUNTER — Other Ambulatory Visit (HOSPITAL_COMMUNITY): Payer: Self-pay

## 2023-08-20 ENCOUNTER — Other Ambulatory Visit: Payer: Self-pay

## 2023-08-28 DIAGNOSIS — M545 Low back pain, unspecified: Secondary | ICD-10-CM | POA: Diagnosis not present

## 2023-08-28 DIAGNOSIS — G4733 Obstructive sleep apnea (adult) (pediatric): Secondary | ICD-10-CM | POA: Diagnosis not present

## 2023-09-06 DIAGNOSIS — M545 Low back pain, unspecified: Secondary | ICD-10-CM | POA: Diagnosis not present

## 2023-09-21 DIAGNOSIS — M545 Low back pain, unspecified: Secondary | ICD-10-CM | POA: Diagnosis not present

## 2023-09-25 ENCOUNTER — Other Ambulatory Visit (HOSPITAL_COMMUNITY): Payer: Self-pay

## 2023-09-25 ENCOUNTER — Other Ambulatory Visit: Payer: Self-pay | Admitting: Internal Medicine

## 2023-10-01 ENCOUNTER — Telehealth: Payer: Self-pay | Admitting: Pharmacy Technician

## 2023-10-01 ENCOUNTER — Other Ambulatory Visit (HOSPITAL_COMMUNITY): Payer: Self-pay

## 2023-10-01 ENCOUNTER — Telehealth: Payer: Self-pay | Admitting: Internal Medicine

## 2023-10-01 NOTE — Telephone Encounter (Signed)
LVM (per DPR) that patient's PA for [facility] has been approved.

## 2023-10-01 NOTE — Telephone Encounter (Signed)
Spoke with patient and let her know that the PA for her [facility] has been submitted. Patient was informed that it could take 48-72 hours before it is known if it is approved or not. The patient verbalized understanding.  All questions (if any) were answered. Erick Alley, RN 10/01/2023 1:33 PM

## 2023-10-01 NOTE — Telephone Encounter (Signed)
  Pt c/o medication issue:  1. Name of Medication:   Semaglutide-Weight Management (WEGOVY) 1.7 MG/0.75ML SOAJ    2. How are you currently taking this medication (dosage and times per day)? Inject 1.7 mg into the skin once a week.   3. Are you having a reaction (difficulty breathing--STAT)? No   4. What is your medication issue? Pt said, according to the pharmacy, she needs to obtain prior authorization for her [facility]. Her dose is scheduled for today. She would like a callback once the prior authorization request has been submitted and would like to know when she can expect to receive her medication

## 2023-10-01 NOTE — Telephone Encounter (Signed)
Pharmacy Patient Advocate Encounter  Received notification from [facility] that Prior Authorization for wegovy has been APPROVED from 10/01/23 to 09/10/24. Ran test claim, Copay is $0.00 one month. This test claim was processed through [facility]- copay amounts may vary at other pharmacies due to pharmacy/plan contracts, or as the patient moves through the different stages of their insurance plan.   PA #/Case ID/Reference #: [id number]

## 2023-10-01 NOTE — Telephone Encounter (Signed)
Pharmacy Patient Advocate Encounter   Received notification from Pt Calls Messages that prior authorization for wegovy is required/requested.   Insurance verification completed.   The patient is insured through [facility] .   Per test claim: PA required; PA submitted to above mentioned insurance via CoverMyMeds Key/confirmation #/EOC [facility] Status is pending

## 2023-10-01 NOTE — Telephone Encounter (Signed)
Received notice from pharm D that patient's PA for [facility] had been approved by [employer]. LVM (per DPR) to notify patient.

## 2023-10-13 DIAGNOSIS — G4733 Obstructive sleep apnea (adult) (pediatric): Secondary | ICD-10-CM | POA: Diagnosis not present

## 2023-10-15 NOTE — Progress Notes (Signed)
 Cardiology Office Note    Patient Name: Wendy [name] Date of Encounter: 10/15/2023  Primary Care Provider:  Elliot Charm, MD Primary Cardiologist:  Wendy DELENA Leavens, MD Primary Electrophysiologist: None   Past Medical History    Past Medical History:  Diagnosis Date   Anxiety    Asthma    daily and prn inhalers   Cervical disc disease    decreased range of motion   Chronic pain    shoulders, neck, lower back   Coronary artery disease    Deformity, hand    left   Depression    Eczema    Endometriosis    Lupron  injection Q 3 mos.   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  Wendy [name] was seen initially in 2021 when she presented to the ED with chest pain and found to have NSTEMI.  She underwent LHC that showed 99% stenosed mid RCA treated with DES x 1, 80% stenosed OM1 treated with DES x 1, 25% stenosis proximal circumflex, 50% stenosed first diagonal, 25% proximal LAD.  TTE was completed showing EF of 55-60% with no RWMA and mild to moderate MVR, mild to moderate AVR. She had complaint of palpitations with PVCs and wore a 14-day ZIO monitor that showed no malignant arrhythmias. She was seen in the ED in 2024 with complaint of chest pain that resolved with nitroglycerin .  She completed a nuclear PET stress test prior to completing colonoscopy that showed areas of ischemia and was low risk.  She also completed an updated 2D echo on 01/2023  that showed stable MR and AI with plan to repeat in 1 year.  She is currently followed by pulmonary for management of COPD.  Contacted the office on 07/19/2023 with report of GI side effects on Wegovy  with increased dose of 2.4 mg.  He endorses diarrhea, gas, bloating, nausea and vomiting.  Wendy [name] for follow-up and complaint of GI upset with Wegovy .  She reports since her follow-up she has experienced side effects, leading to a reduction in the dose.  She had increased dose to 2.4 mg up from 1.7 mg.  Experienced nausea, vomiting, diarrhea and bloating.  The patient is considering increasing the dose again, hoping for better weight loss results. The patient also reports a one-day episode of pain in the back of the leg and arm, which resolved with a muscle relaxer.  She suffers from sciatica and is currently followed by her PCP.  The patient has been using a CPAP machine for sleep apnea but finds it uncomfortable and sometimes difficult to breathe with. The patient is on Symbicort  and Ventolin  for COPD and has not needed to use a rescue inhaler. The patient is also on cholesterol medication and aspirin  for heart disease. The patient is active, walking daily, but has noticed dizziness after using the treadmill. The patient's  BMI has decreased from 43.22 to 36, indicating progress in weight loss.  Patient denies chest pain, palpitations, dyspnea, PND, orthopnea, nausea, vomiting, dizziness, syncope, edema, weight gain, or early satiety.  Review of Systems  Please see the history of present illness.    All other systems reviewed and are otherwise negative except as noted above.  Physical Exam    Wt Readings from Last 3 Encounters:  08/02/23 186 lb (84.4 kg)  05/02/23 201 lb (91.2 kg)  04/23/23 203 lb 9.6 oz (92.4 kg)   CD:Uyzmz were no vitals filed for this visit.,There is no height or weight on file to calculate BMI. GEN: Well nourished, well developed in no acute distress Neck: No  JVD; No carotid bruits Pulmonary: Clear to auscultation without rales, wheezing or rhonchi  Cardiovascular: Normal rate. Regular rhythm. Normal S1. Normal S2.   Murmurs: Soft systolic murmur 2/6 ABDOMEN: Soft, non-tender, non-distended EXTREMITIES:  No edema; No deformity   EKG/LABS/ Recent Cardiac Studies   ECG personally reviewed by me [name] -completed [name]  Risk Assessment/Calculations:          Lab Results  Component Value Date   WBC 9.5 11/20/2022   HGB 11.6 (L) 11/20/2022   HCT 37.4 11/20/2022   MCV 88.8 11/20/2022   PLT 270 11/20/2022   Lab Results  Component Value Date   CREATININE 0.64 11/20/2022   BUN 12 11/20/2022   NA 136 11/20/2022   K 3.1 (L) 11/20/2022   CL 106 11/20/2022   CO2 25 11/20/2022   Lab Results  Component Value Date   CHOL 131 03/03/2022   HDL 86 03/03/2022   LDLCALC 35 03/03/2022   TRIG 40 03/03/2022   CHOLHDL 1.5 03/03/2022    Lab Results  Component Value Date   HGBA1C 5.0 07/15/2020   Assessment & Plan    1.  Coronary artery disease: -s/p CAD s/p NSTEMI 2021 treated with PCI/DES to 99% mRCA and 80% OM -[name] patient reports no chest pain or angina since previous follow-up. -Stable with two stents in place. No new areas of ischemia on recent PET stress test. LDL controlled at 35 on statin therapy. No chest pain reported. -Continue Aspirin  81 mg, Imdur  60 mg, Nitrostat  0.4 mg PRN, and  Crestor  40 mg, and Repatha  -Continue to advance activity with moderate exercise with at least 150 minutes/week.  2.  Essential hypertension: -Patient's blood pressure [name] was 100/60 -Continue Caredizem 120 mg  3.  Nonrheumatic AI: -Most recent 2D echo completed 01/2023 stable MR and AI plan to repeat in 1 year around 01/2024 -Continue Lasix  20 mg PRN   4.  History of COPD: -Most recent PFT showed moderate restriction. -Currently followed by pulmonology on CPAP therapy  5.  Hyperlipidemia: -Patient's LDL cholesterol was at goal at 35 -Continue  Crestor  40 mg daily and Repatha   6.  History of obesity: -Previous BMI was 43.22 kg/m and is now 36.57 kg/m -Patient experienced adverse reactions with previous titration of Wegovy  and we will increase back to 10.4 mg daily -Patient advised to keep food diary and to contact in 2 weeks with update on tolerance -We will plan to refer back to clinical pharmacist if GI symptoms returned with increased dose.  Disposition: Follow-up with Wendy DELENA Leavens, MD or APP in 6 months   Signed, Wyn Raddle, Jackee Shove, NP 10/15/2023, 6:31 PM [facility]

## 2023-10-16 ENCOUNTER — Encounter: Payer: Self-pay | Admitting: Nurse Practitioner

## 2023-10-16 ENCOUNTER — Other Ambulatory Visit (HOSPITAL_COMMUNITY): Payer: Self-pay

## 2023-10-16 ENCOUNTER — Ambulatory Visit: Payer: 59 | Attending: Nurse Practitioner | Admitting: Nurse Practitioner

## 2023-10-16 VITALS — BP 100/60 | HR 93 | Ht 59.0 in | Wt 181.0 lb

## 2023-10-16 DIAGNOSIS — R0609 Other forms of dyspnea: Secondary | ICD-10-CM

## 2023-10-16 DIAGNOSIS — I10 Essential (primary) hypertension: Secondary | ICD-10-CM | POA: Diagnosis not present

## 2023-10-16 DIAGNOSIS — I340 Nonrheumatic mitral (valve) insufficiency: Secondary | ICD-10-CM

## 2023-10-16 DIAGNOSIS — E782 Mixed hyperlipidemia: Secondary | ICD-10-CM | POA: Diagnosis not present

## 2023-10-16 DIAGNOSIS — I351 Nonrheumatic aortic (valve) insufficiency: Secondary | ICD-10-CM

## 2023-10-16 DIAGNOSIS — Z6841 Body Mass Index (BMI) 40.0 and over, adult: Secondary | ICD-10-CM

## 2023-10-16 DIAGNOSIS — I2510 Atherosclerotic heart disease of native coronary artery without angina pectoris: Secondary | ICD-10-CM

## 2023-10-16 MED ORDER — WEGOVY 2.4 MG/0.75ML ~~LOC~~ SOAJ
2.4000 mg | SUBCUTANEOUS | 3 refills | Status: DC
  Filled 2023-10-16: qty 3, 28d supply, fill #0
  Filled 2023-11-12: qty 3, 28d supply, fill #1
  Filled 2023-12-12: qty 3, 28d supply, fill #2
  Filled 2024-01-10: qty 3, 28d supply, fill #3

## 2023-10-16 NOTE — Patient Instructions (Signed)
 Medication Instructions:   INCREASE Wegovy  (2.4 mg) into skin weekly.  *If you need a refill on your cardiac medications before your next appointment, please call your pharmacy*   Lab Work:   TODAY!!!! BMET  If you have labs (blood work) drawn today and your tests are completely normal, you will receive your results only by: MyChart Message (if you have MyChart) OR A paper copy in the mail If you have any lab test that is abnormal or we need to change your treatment, we will call you to review the results.   Testing/Procedures:  None ordered.   Follow-Up: At [facility], you and your health needs are our priority.  As part of our continuing mission to provide you with exceptional heart care, we have created designated Provider Care Teams.  These Care Teams include your primary Cardiologist (physician) and Advanced Practice Providers (APPs -  Physician Assistants and Nurse Practitioners) who all work together to provide you with the care you need, when you need it.  We recommend signing up for the patient portal called MyChart.  Sign up information is provided on this After Visit Summary.  MyChart is used to connect with patients for Virtual Visits (Telemedicine).  Patients are able to view lab/test results, encounter notes, upcoming appointments, etc.  Non-urgent messages can be sent to your provider as well.   To learn more about what you can do with MyChart, go to [email or web address].    Your next appointment:   6 month(s)  Provider:   Stanly DELENA Leavens, MD or Jackee Wyn PIETY     Other Instructions  Your physician wants you to follow-up in: 6 months.  You will receive a reminder letter in the mail two months in advance. If you don't receive a letter, please call our office to schedule the follow-up appointment.    1st Floor: - Lobby - Registration  - Pharmacy  - Lab - Cafe  2nd Floor: - PV Lab - Diagnostic Testing (echo, CT, nuclear med)  3rd  Floor: - Vacant  4th Floor: - TCTS (cardiothoracic surgery) - AFib Clinic - Structural Heart Clinic - Vascular Surgery  - Vascular Ultrasound  5th Floor: - HeartCare Cardiology (general and EP) - Clinical Pharmacy for coumadin, hypertension, lipid, weight-loss medications, and med management appointments    Valet parking services will be available as well.    Adopting a Healthy Lifestyle.   Weight: Know what a healthy weight is for you (roughly BMI <25) and aim to maintain this. You can calculate your body mass index on your smart phone. Unfortunately, this is not the most accurate measure of healthy weight, but it is the simplest measurement to use. A more accurate measurement involves body scanning which measures lean muscle, fat tissue and bony density. We do not have this equipment at [facility].    Diet: Aim for 7+ servings of fruits and vegetables daily Limit animal fats in diet for cholesterol and heart health - choose grass fed whenever available Avoid highly processed foods (fast food burgers, tacos, fried chicken, pizza, hot dogs, french fries)  Saturated fat comes in the form of butter, lard, coconut oil, margarine, partially hydrogenated oils, and fat in meat. These increase your risk of cardiovascular disease.  Use healthy plant oils, such as olive, canola, soy, corn, sunflower and peanut.  Whole foods such as fruits, vegetables and whole grains have fiber  Men need > 38 grams of fiber per day Women need > 25 grams of fiber per day  Load up on vegetables and fruits - one-half of your plate: Aim for color and variety, and remember that potatoes dont count. Go for whole grains - one-quarter of your plate: Whole wheat, barley, wheat berries, quinoa, oats, brown rice, and foods made with them. If you want pasta, go with whole wheat pasta. Protein power - one-quarter of your plate: Fish, chicken, beans, and nuts are all healthy, versatile protein sources. Limit red meat. You  need carbohydrates for energy! The type of carbohydrate is more important than the amount. Choose carbohydrates such as vegetables, fruits, whole grains, beans, and nuts in the place of white rice, white pasta, potatoes (baked or fried), macaroni and cheese, cakes, cookies, and donuts.  If youre thirsty, drink water. Coffee and tea are good in moderation, but skip sugary drinks and limit milk and dairy products to one or two daily servings. Keep sugar intake at 6 teaspoons or 24 grams or LESS       Exercise: Aim for 150 min of moderate intensity exercise weekly for heart health, and weights twice weekly for bone health Stay active - any steps are better than no steps! Aim for 7-9 hours of sleep daily

## 2023-10-17 LAB — BASIC METABOLIC PANEL
BUN/Creatinine Ratio: 15 (ref 9–23)
BUN: 12 mg/dL (ref 6–24)
CO2: 22 mmol/L (ref 20–29)
Calcium: 9.3 mg/dL (ref 8.7–10.2)
Chloride: 107 mmol/L — ABNORMAL HIGH (ref 96–106)
Creatinine, Ser: 0.82 mg/dL (ref 0.57–1.00)
Glucose: 68 mg/dL — ABNORMAL LOW (ref 70–99)
Potassium: 3.7 mmol/L (ref 3.5–5.2)
Sodium: 144 mmol/L (ref 134–144)
eGFR: 83 mL/min/{1.73_m2} (ref >59)

## 2023-10-22 ENCOUNTER — Telehealth: Payer: Self-pay | Admitting: Internal Medicine

## 2023-10-22 NOTE — Telephone Encounter (Signed)
 Patient states [employer] says she in noncompliant with CPAP machine. Patient needs to be reinstated with CPAP machine. Patient phone number is [phone].

## 2023-10-23 NOTE — Telephone Encounter (Signed)
Called and spoke to patient. She stated that insurance says that she is noncompliant and has requested that she return her machine. She would like another order for cpap. Advised pt that she would need repeat sleep study.  [name], please advise. Thanks

## 2023-10-24 NOTE — Telephone Encounter (Signed)
Looks like when I saw her in November she was wearing it. If she starts wearing her machine ago she will be compliant and I can see her for an office visit to document compliance and benefit.

## 2023-10-26 NOTE — Telephone Encounter (Signed)
ATC X1. Lmtcb

## 2023-10-29 NOTE — Telephone Encounter (Signed)
ATC Brad with [employer] to get further details.  Will await return call.

## 2023-10-30 ENCOUNTER — Other Ambulatory Visit (HOSPITAL_COMMUNITY): Payer: Self-pay

## 2023-10-30 DIAGNOSIS — R202 Paresthesia of skin: Secondary | ICD-10-CM | POA: Diagnosis not present

## 2023-10-30 MED ORDER — GABAPENTIN 100 MG PO CAPS
100.0000 mg | ORAL_CAPSULE | Freq: Every evening | ORAL | 0 refills | Status: DC
  Filled 2023-10-30: qty 5, 5d supply, fill #0

## 2023-11-02 ENCOUNTER — Ambulatory Visit: Payer: 59 | Admitting: Internal Medicine

## 2023-11-05 NOTE — Telephone Encounter (Signed)
 Spoke with Wendy [name] ([employer]), he sent me the following e-mail explaining the situation.  He stated that the specialist handling the case also called and spoke with the patient and explained that for insurance to pay, she would have to start over.  I reviewed the account of Wendy [name] and for that the patient has not been compliant with usage in airview and insurance will not continue to pay for the pap unit.  Per air view her usage for last 90 days has been 54%. I have attached a copy of the 90-day report.  (DL printed and put in cabinet for [name])  Patient has an OV on 3/11 with [name].  This can be discussed during this visit.  Nothing further needed.

## 2023-11-12 ENCOUNTER — Other Ambulatory Visit (HOSPITAL_COMMUNITY): Payer: Self-pay

## 2023-11-14 ENCOUNTER — Other Ambulatory Visit (HOSPITAL_COMMUNITY): Payer: Self-pay

## 2023-11-14 ENCOUNTER — Other Ambulatory Visit: Payer: Self-pay | Admitting: Internal Medicine

## 2023-11-14 DIAGNOSIS — E782 Mixed hyperlipidemia: Secondary | ICD-10-CM

## 2023-11-14 DIAGNOSIS — I2510 Atherosclerotic heart disease of native coronary artery without angina pectoris: Secondary | ICD-10-CM

## 2023-11-14 MED ORDER — REPATHA SURECLICK 140 MG/ML ~~LOC~~ SOAJ
140.0000 mg | SUBCUTANEOUS | 3 refills | Status: DC
  Filled 2023-11-14 – 2023-11-28 (×2): qty 6, 84d supply, fill #0
  Filled 2024-03-18: qty 6, 84d supply, fill #1
  Filled 2024-06-10: qty 6, 84d supply, fill #2
  Filled 2024-09-14: qty 6, 84d supply, fill #3

## 2023-11-20 ENCOUNTER — Other Ambulatory Visit (HOSPITAL_COMMUNITY): Payer: Self-pay

## 2023-11-20 ENCOUNTER — Encounter: Payer: Self-pay | Admitting: Internal Medicine

## 2023-11-20 ENCOUNTER — Ambulatory Visit (INDEPENDENT_AMBULATORY_CARE_PROVIDER_SITE_OTHER): Payer: 59 | Admitting: Internal Medicine

## 2023-11-20 ENCOUNTER — Ambulatory Visit: Payer: 59 | Admitting: Internal Medicine

## 2023-11-20 ENCOUNTER — Telehealth: Payer: Self-pay | Admitting: *Deleted

## 2023-11-20 ENCOUNTER — Other Ambulatory Visit: Payer: Self-pay

## 2023-11-20 VITALS — BP 130/60 | HR 66 | Temp 97.8°F | Ht <= 58 in | Wt 178.8 lb

## 2023-11-20 VITALS — BP 122/72 | HR 79 | Temp 97.6°F | Resp 16 | Ht 59.0 in | Wt 178.7 lb

## 2023-11-20 DIAGNOSIS — J310 Chronic rhinitis: Secondary | ICD-10-CM

## 2023-11-20 DIAGNOSIS — G4733 Obstructive sleep apnea (adult) (pediatric): Secondary | ICD-10-CM | POA: Diagnosis not present

## 2023-11-20 DIAGNOSIS — L501 Idiopathic urticaria: Secondary | ICD-10-CM | POA: Diagnosis not present

## 2023-11-20 DIAGNOSIS — J439 Emphysema, unspecified: Secondary | ICD-10-CM | POA: Diagnosis not present

## 2023-11-20 DIAGNOSIS — I2510 Atherosclerotic heart disease of native coronary artery without angina pectoris: Secondary | ICD-10-CM

## 2023-11-20 DIAGNOSIS — J4489 Other specified chronic obstructive pulmonary disease: Secondary | ICD-10-CM

## 2023-11-20 DIAGNOSIS — E669 Obesity, unspecified: Secondary | ICD-10-CM

## 2023-11-20 DIAGNOSIS — K219 Gastro-esophageal reflux disease without esophagitis: Secondary | ICD-10-CM | POA: Diagnosis not present

## 2023-11-20 DIAGNOSIS — Z6837 Body mass index (BMI) 37.0-37.9, adult: Secondary | ICD-10-CM

## 2023-11-20 DIAGNOSIS — Z87891 Personal history of nicotine dependence: Secondary | ICD-10-CM | POA: Diagnosis not present

## 2023-11-20 MED ORDER — PANTOPRAZOLE SODIUM 20 MG PO TBEC
20.0000 mg | DELAYED_RELEASE_TABLET | Freq: Every day | ORAL | 5 refills | Status: DC
  Filled 2023-11-20: qty 30, 30d supply, fill #0

## 2023-11-20 MED ORDER — AZELASTINE HCL 0.1 % NA SOLN
1.0000 | Freq: Two times a day (BID) | NASAL | 5 refills | Status: DC | PRN
  Filled 2023-11-20: qty 30, 30d supply, fill #0

## 2023-11-20 MED ORDER — FLUTICASONE PROPIONATE 50 MCG/ACT NA SUSP
2.0000 | Freq: Every day | NASAL | 5 refills | Status: DC
  Filled 2023-11-20: qty 16, 30d supply, fill #0

## 2023-11-20 MED ORDER — FEXOFENADINE HCL 180 MG PO TABS
180.0000 mg | ORAL_TABLET | Freq: Two times a day (BID) | ORAL | 5 refills | Status: DC | PRN
  Filled 2023-11-20: qty 60, 30d supply, fill #0

## 2023-11-20 NOTE — Progress Notes (Signed)
 FOLLOW UP Date of Service/Encounter:  11/20/23   Subjective:  Wendy [name] (DOB: [birth year]) is a 58 y.o. female who [facility] for follow up for idiopathic urticaria, chronic rhinitis, GERD.   History obtained from: chart review and patient. Last visit was with [place] on 05/02/2023 and at the time was well controlled on Flonase/Azelastine PRN, Protonix and Allegra/Pepcid.   Since last visit, reports rhinitis is doing fine.  She uses Flonase and Azelastine as needed with congestion/drainage and that helps control it.   Reflux is controlled on protonix daily; she tried reducing to every other day but had worsening heart burn.  As long as she takes the protonix, she does fine.  Hives and on and off; sometimes they are well controlled and other times she flares up.  Not taking Allegra daily, just when needed. Last use was several weeks ago.  They are mostly short lasting.   Past Medical History: Past Medical History:  Diagnosis Date   Anxiety    Asthma    daily and prn inhalers   Cervical disc disease    decreased range of motion   Chronic pain    shoulders, neck, lower back   Coronary artery disease    Deformity, hand    left   Depression    Eczema    Endometriosis    Lupron injection Q 3 mos.   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 Physical  Exam: GEN: alert, well developed HEENT: clear conjunctiva, nose without inferior turbinate hypertrophy, pink nasal mucosa, no rhinorrhea, + cobblestoning HEART: regular rate and rhythm, no murmur LUNGS: clear to auscultation bilaterally, no coughing, unlabored respiration SKIN: no rashes or lesions  Assessment:   1. Chronic rhinitis   2. Idiopathic urticaria   3. Gastroesophageal reflux disease, unspecified whether esophagitis present     Plan/Recommendations:  Chronic Rhinitis: - Controlled  - Positive skin test 08/2022: none - Use nasal saline rinses before nose sprays such as with Neilmed Sinus Rinse.  Use distilled water.   - If symptoms worsen, use Flonase 2 sprays each nostril daily. Aim upward and outward. - Use Azelastine 1-2 sprays each nostril twice daily as needed. Aim upward and outward.  GERD: - Controlled on PPI daily.  -Continue Protonix 20mg  daily.  Take this on an empty stomach.   -Avoid lying down for at least two hours after a meal or after drinking acidic beverages, like soda, or other caffeinated beverages. This can help to prevent stomach contents from flowing back into the esophagus. -Keep your head elevated while you sleep. Using an extra pillow or two can also help to prevent reflux. -Eat smaller and more frequent meals each day instead of  a few large meals. This promotes digestion and can aid in preventing heartburn. -Wear loose-fitting clothes to ease pressure on the stomach, which can worsen heartburn and reflux. -Reduce excess weight around the midsection. This can ease pressure on the stomach. Such pressure can force some stomach contents back up the esophagus.   Idiopathic Urticaria (Hives) - Not well controlled, discussed daily use of anti histamine if having frequent outbreaks and to uptitrate regimen as needed.  - At this time etiology of hives and swelling is unknown. Hives can be caused by a variety of different triggers including  illness/infection, exercise, pressure, vibrations, extremes of temperature to name a few however majority of the time there is no identifiable trigger.  -If hives recur, restart Allegra 180mg  daily.   -If no improvement in 2-3 days. increase to Allegra 180mg  twice daily.   -If no improvement in 2-3 days, add Pepcid 20mg  twice daily and continue Allegra 180mg  twice daily.  Large Local Reaction to Stinging Insect : - based on history, your previous reaction is consistent with a large local or local reaction - your risk of a severe systemic reaction remains low and we do not need to do anything different at this time - if you are stung again and have other symptoms besides hives or localized swelling, please let us know as that would change our management - for SKIN (hives/swelling) only reaction, okay to take Benadryl 25mg  capsules every 6 hours as needed - for SKIN + ANY additional symptoms, OR IF concern for LIFE THREATENING reaction = Epipen Autoinjector EpiPen 0.3 mg.  COPD - Followed by Pulm on Symbicort and PRN Albuterol.     Return in about 6 months (around 05/22/2024).  Alesia Morin, MD [facility]

## 2023-11-20 NOTE — Progress Notes (Signed)
 Wendy [name]    [id number]    12-Jul-1966  Primary Care Physician:Varadarajan, Soyla Murphy, MD Date of Appointment: 11/20/2023 Established Patient Visit  Chief complaint:   Chief Complaint  Patient presents with   Follow-up    Patient is doing well.     HPI: Wendy [name] is a 58 y.o. woman with copd/emphysema, mild aortic insufficiency, and OSA.   Interval Updates: Here for follow up for OSA.  [employer] took her machine away because she wasn't wearing it.  She is here because she wants to start over and try again. She did derive benefit when she was using it but had trouble adjusting to the mask when she had night sweats.    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 Gets short of breath with exertion She is still taking symbicort 2 puffs twice daily. Takes albuterol 1-2 times/week.  No nocturnal symptoms.   I have reviewed the patient's family social and past medical history and updated as appropriate.   Past Medical History:  Diagnosis Date   Anxiety    Asthma    daily and prn inhalers   Cervical disc disease    decreased range of motion   Chronic pain    shoulders, neck, lower back   Coronary artery disease    Deformity, hand    left   Depression    Eczema    Endometriosis    Lupron injection Q 3 mos.   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   Urticaria    Wears partial dentures    upper partial    Past Surgical History:  Procedure Laterality Date   ANKLE RECONSTRUCTION  02/01/2012   Procedure: RECONSTRUCTION ANKLE;  Surgeon: Toni Arthurs, MD;  Location: [facility];  Service: Orthopedics;  Laterality: Right;  Excision of right calcaneonavicular coalition with autologus fat graft interposition   BRACHIAL PLEXUS EXPLORATION      CARDIAC CATHETERIZATION     COLONOSCOPY WITH PROPOFOL N/A 02/16/2023   Procedure: COLONOSCOPY WITH PROPOFOL;  Surgeon: Jeani Hawking, MD;  Location: [facility];  Service: Gastroenterology;  Laterality: N/A;   CORONARY STENT INTERVENTION N/A 07/15/2020   Procedure: CORONARY STENT INTERVENTION;  Surgeon: Corky Crafts, MD;  Location: [facility] INVASIVE CV LAB;  Service: Cardiovascular;  Laterality: N/A;   DIAGNOSTIC LAPAROSCOPY  10/02/2008   peritoneal bx.   LEFT HEART CATH AND CORONARY ANGIOGRAPHY N/A 07/15/2020   Procedure: LEFT HEART CATH AND CORONARY ANGIOGRAPHY;  Surgeon: Corky Crafts, MD;  Location: [facility] INVASIVE CV LAB;  Service: Cardiovascular;  Laterality: N/A;   MULTIPLE TOOTH EXTRACTIONS     upper teeth and wisdom teeth   OVARIAN CYST REMOVAL  2000   RIB RESECTION     left - cervical rib removal   SHOULDER ARTHROSCOPY W/ ROTATOR CUFF REPAIR  07/2011   right   SHOULDER SURGERY     left   TOOTH EXTRACTION     x 1    Family History  Problem Relation Age of Onset   Heart failure Mother    Cancer Mother    COPD Mother    Dementia Father    Stroke Father    CAD Father  Heart failure Sister     Social History   Occupational History   Not on file  Tobacco Use   Smoking status: Former    Current packs/day: 0.00    Average packs/day: 1 pack/day for 25.0 years (25.0 ttl pk-yrs)    Types: Cigarettes    Start date: 07/15/1995    Quit date: 07/14/2020    Years since quitting: 3.3    Passive exposure: Never   Smokeless tobacco: Never  Vaping Use   Vaping status: Never Used  Substance and Sexual Activity   Alcohol use: No    Alcohol/week: 0.0 standard drinks of alcohol    Comment: occasional   Drug use: No   Sexual activity: Yes    Partners: Male    Birth control/protection: None     Physical Exam: Blood pressure 130/60, pulse 66, temperature 97.8 F (36.6 C), temperature source Oral, height 4\' 10"  (1.473 m), weight 178 lb 12.8 oz (81.1 kg), last menstrual  period 07/12/2016, SpO2 100%.  Gen:      No acute distress ENT:  no nasal polyps, mucus membranes moist Lungs:    No increased respiratory effort, symmetric chest wall excursion, clear to auscultation bilaterally, no wheezes or crackles CV:        RRR systolic murmur, no lower extremity edema   Data Reviewed: Imaging: I have personally reviewed the CT Chest lung cancer screenig June 2024 - no concerning lung nodules/masses.   PFTs:     Latest Ref Rng & Units 08/02/2023   12:54 PM  PFT Results  FVC-Pre L 1.63   FVC-Predicted Pre % 58   FVC-Post L 1.41   FVC-Predicted Post % 50   Pre FEV1/FVC % % 84   Post FEV1/FCV % % 86   FEV1-Pre L 1.37   FEV1-Predicted Pre % 62   FEV1-Post L 1.21   DLCO uncorrected ml/min/mmHg 11.62   DLCO UNC% % 67   DLCO corrected ml/min/mmHg 11.62   DLCO COR %Predicted % 67   DLVA Predicted % 104   TLC L 2.91   TLC % Predicted % 67   RV % Predicted % 81    I have personally reviewed the patient's PFTs and PFTs show moderarte restriction to ventilation with reduced dlco likely secondary to body habitus. No airflow limitation.   Labs: Lab Results  Component Value Date   WBC 9.5 11/20/2022   HGB 11.6 (L) 11/20/2022   HCT 37.4 11/20/2022   MCV 88.8 11/20/2022   PLT 270 11/20/2022   Lab Results  Component Value Date   NA 144 10/16/2023   K 3.7 10/16/2023   CO2 22 10/16/2023   GLUCOSE 68 (L) 10/16/2023   BUN 12 10/16/2023   CREATININE 0.82 10/16/2023   CALCIUM 9.3 10/16/2023   EGFR 83 10/16/2023   GFRNONAA >60 11/20/2022     Immunization status: Immunization History  Administered Date(s) Administered   Influenza,inj,Quad PF,6+ Mos 06/15/2014   MMR 04/15/2002   Td 02/24/2002    External Records Personally Reviewed: cardiology, GI  Assessment:  Shortness of breath, multifactorial from: COPD, Gold Stage 0 Moderate OSA on CPAP AHI on home sleep test June 2024 was 21 events/hr Obesity on GLP1 Body mass index is 37.37  kg/m. CAD  Plan/Recommendations:  We will work on sending you a new CPAP machine. You will need to follow up between 31-90 days of using your CPAP  machine to document effectiveness for reimbursement.   Continue symbicort 2 puffs twice daily, gargle after  use.   Continue albuterol inhaler as needed.    I will see you back in June after the CT Chest for lung cancer screening.   Return to Care: Return in about 3 months (around 02/20/2024).   Durel Salts, MD Pulmonary and Critical Care Medicine [facility] [place] Office:[phone]

## 2023-11-20 NOTE — Telephone Encounter (Signed)
 ATC Brad with [employer] to get clarification on what "starting over" means as far as CPAP therapy.  She had a HST back on 02/12/2023.  Will await return call.

## 2023-11-20 NOTE — Patient Instructions (Addendum)
 Chronic Rhinitis: - Positive skin test 08/2022: none - Use nasal saline rinses before nose sprays such as with Neilmed Sinus Rinse.  Use distilled water.   - If symptoms worsen, use Flonase 2 sprays each nostril daily. Aim upward and outward. - Use Azelastine 1-2 sprays each nostril twice daily as needed. Aim upward and outward.  GERD: -Continue Protonix 20mg  daily.  Take this on an empty stomach.   -Avoid lying down for at least two hours after a meal or after drinking acidic beverages, like soda, or other caffeinated beverages. This can help to prevent stomach contents from flowing back into the esophagus. -Keep your head elevated while you sleep. Using an extra pillow or two can also help to prevent reflux. -Eat smaller and more frequent meals each day instead of a few large meals. This promotes digestion and can aid in preventing heartburn. -Wear loose-fitting clothes to ease pressure on the stomach, which can worsen heartburn and reflux. -Reduce excess weight around the midsection. This can ease pressure on the stomach. Such pressure can force some stomach contents back up the esophagus.   Idiopathic Urticaria (Hives) - At this time etiology of hives and swelling is unknown. Hives can be caused by a variety of different triggers including illness/infection, exercise, pressure, vibrations, extremes of temperature to name a few however majority of the time there is no identifiable trigger.  -If hives recur, restart Allegra 180mg  daily.   -If no improvement in 2-3 days. increase to Allegra 180mg  twice daily.   -If no improvement in 2-3 days, add Pepcid 20mg  twice daily and continue Allegra 180mg  twice daily.  Large Local Reaction to Stinging Insect : - based on history, your previous reaction is consistent with a large local or local reaction - your risk of a severe systemic reaction remains low and we do not need to do anything different at this time - if you are stung again and have other  symptoms besides hives or localized swelling, please let us know as that would change our management - for SKIN (hives/swelling) only reaction, okay to take Benadryl 25mg  capsules every 6 hours as needed - for SKIN + ANY additional symptoms, OR IF concern for LIFE THREATENING reaction = Epipen Autoinjector EpiPen 0.3 mg.

## 2023-11-20 NOTE — Patient Instructions (Addendum)
 It was a pleasure to see you today!  Please schedule follow up scheduled with myself in 3 months.  If my schedule is not open yet, we will contact you with a reminder closer to that time. Please call [phone] if you haven't heard from [place] a month before, and always call us sooner if issues or concerns arise. You can also send [place] a message through MyChart, but but aware that this is not to be used for urgent issues and it may take up to 5-7 days to receive a reply. Please be aware that you will likely be able to view your results before I have a chance to respond to them. Please give [place] 5 business days to respond to any non-urgent results.   We will work on sending you a new CPAP machine. You will need to follow up between 31-90 days of using your CPAP  machine to document effectiveness for reimbursement.   Continue symbicort 2 puffs twice daily, gargle after use.   Continue albuterol inhaler as needed.    I will see you back in June after the CT Chest for lung cancer screening.

## 2023-11-26 ENCOUNTER — Other Ambulatory Visit: Payer: Self-pay | Admitting: Internal Medicine

## 2023-11-26 ENCOUNTER — Other Ambulatory Visit (HOSPITAL_COMMUNITY): Payer: Self-pay

## 2023-11-26 ENCOUNTER — Encounter: Payer: Self-pay | Admitting: Internal Medicine

## 2023-11-26 DIAGNOSIS — R002 Palpitations: Secondary | ICD-10-CM

## 2023-11-26 NOTE — Telephone Encounter (Signed)
 Pt says that CP involved, non radiating and not similar to her previous CP prior to PCI.  Pain, which she describes as pressure, is located under her left breast. She did take a nitroglycerin. Pain improved some. Heart was "going very fast" when this occurred. Only symptoms were chest pressure and heart racing. Aware forwarding to MD/NP to review/advise, she will go to ED for evaluation if reoccurs/worsens/changes in nature until she hears back from our office. I did tell pt to reach out to her PCP as well. She appreciates the return call and talking with her about this.

## 2023-11-26 NOTE — Telephone Encounter (Signed)
 Duplicate.  See other mychart message sent today

## 2023-11-27 ENCOUNTER — Other Ambulatory Visit (HOSPITAL_COMMUNITY): Payer: Self-pay

## 2023-11-27 ENCOUNTER — Encounter (HOSPITAL_COMMUNITY): Payer: Self-pay

## 2023-11-27 ENCOUNTER — Ambulatory Visit: Attending: Internal Medicine

## 2023-11-27 DIAGNOSIS — R002 Palpitations: Secondary | ICD-10-CM

## 2023-11-27 MED ORDER — NITROGLYCERIN 0.4 MG SL SUBL
SUBLINGUAL_TABLET | SUBLINGUAL | 11 refills | Status: DC
  Filled 2023-11-27: qty 25, 5d supply, fill #0

## 2023-11-27 MED ORDER — ISOSORBIDE MONONITRATE ER 60 MG PO TB24
60.0000 mg | ORAL_TABLET | Freq: Every day | ORAL | 3 refills | Status: DC
  Filled 2023-11-27: qty 90, 90d supply, fill #0

## 2023-11-27 NOTE — Progress Notes (Unsigned)
 Enrolled for [employer] to mail a ZIO XT long term holter monitor to the patients address on file.

## 2023-11-28 ENCOUNTER — Other Ambulatory Visit: Payer: Self-pay

## 2023-11-29 ENCOUNTER — Other Ambulatory Visit (HOSPITAL_COMMUNITY): Payer: Self-pay

## 2023-11-30 DIAGNOSIS — G4733 Obstructive sleep apnea (adult) (pediatric): Secondary | ICD-10-CM | POA: Diagnosis not present

## 2023-12-12 ENCOUNTER — Other Ambulatory Visit (HOSPITAL_COMMUNITY): Payer: Self-pay

## 2023-12-18 DIAGNOSIS — G4733 Obstructive sleep apnea (adult) (pediatric): Secondary | ICD-10-CM | POA: Diagnosis not present

## 2023-12-19 DIAGNOSIS — R002 Palpitations: Secondary | ICD-10-CM | POA: Diagnosis not present

## 2023-12-22 ENCOUNTER — Encounter: Payer: Self-pay | Admitting: Internal Medicine

## 2023-12-22 DIAGNOSIS — R002 Palpitations: Secondary | ICD-10-CM

## 2023-12-25 ENCOUNTER — Other Ambulatory Visit (HOSPITAL_COMMUNITY): Payer: Self-pay

## 2023-12-25 DIAGNOSIS — M47816 Spondylosis without myelopathy or radiculopathy, lumbar region: Secondary | ICD-10-CM | POA: Diagnosis not present

## 2023-12-25 DIAGNOSIS — M4316 Spondylolisthesis, lumbar region: Secondary | ICD-10-CM | POA: Diagnosis not present

## 2023-12-25 MED ORDER — VALACYCLOVIR HCL 1 G PO TABS
1.0000 g | ORAL_TABLET | Freq: Every day | ORAL | 1 refills | Status: AC
  Filled 2023-12-25: qty 30, 30d supply, fill #0

## 2024-01-01 ENCOUNTER — Telehealth: Payer: Self-pay | Admitting: Internal Medicine

## 2024-01-01 NOTE — Telephone Encounter (Signed)
Patient returning call for monitor results. 

## 2024-01-01 NOTE — Telephone Encounter (Signed)
 The patient has been notified of the result and verbalized understanding.  All questions (if any) were answered. Christine Cozier, RN 01/01/2024 5:22 PM

## 2024-01-03 ENCOUNTER — Telehealth: Payer: Self-pay | Admitting: Internal Medicine

## 2024-01-03 ENCOUNTER — Other Ambulatory Visit: Payer: Self-pay

## 2024-01-03 ENCOUNTER — Other Ambulatory Visit (HOSPITAL_COMMUNITY): Payer: Self-pay

## 2024-01-03 MED ORDER — HYDROCODONE-ACETAMINOPHEN 10-325 MG PO TABS
1.0000 | ORAL_TABLET | Freq: Four times a day (QID) | ORAL | 0 refills | Status: AC
  Filled 2024-01-03 (×3): qty 15, 4d supply, fill #0

## 2024-01-03 MED ORDER — IBUPROFEN 800 MG PO TABS
800.0000 mg | ORAL_TABLET | Freq: Four times a day (QID) | ORAL | 0 refills | Status: DC
Start: 2024-01-03 — End: 2024-08-04
  Filled 2024-01-03 (×2): qty 21, 6d supply, fill #0

## 2024-01-03 NOTE — Telephone Encounter (Signed)
 Left message to call back

## 2024-01-03 NOTE — Telephone Encounter (Signed)
 Pt called to ask how long should someone wait after getting a stent before having a dental extraction? She states she hasn't scheduled anything yet. She wanted to asked first because getting in touch with dentist office.

## 2024-01-04 NOTE — Telephone Encounter (Signed)
 2nd attempt to call patient, no answer left message requesting a call back.

## 2024-01-10 ENCOUNTER — Other Ambulatory Visit (HOSPITAL_COMMUNITY): Payer: Self-pay

## 2024-01-10 DIAGNOSIS — G4733 Obstructive sleep apnea (adult) (pediatric): Secondary | ICD-10-CM | POA: Diagnosis not present

## 2024-01-10 MED ORDER — HYDROCODONE-ACETAMINOPHEN 10-325 MG PO TABS
1.0000 | ORAL_TABLET | Freq: Four times a day (QID) | ORAL | 0 refills | Status: DC | PRN
  Filled 2024-01-10: qty 15, 4d supply, fill #0

## 2024-01-10 MED ORDER — IBUPROFEN 800 MG PO TABS
800.0000 mg | ORAL_TABLET | Freq: Four times a day (QID) | ORAL | 0 refills | Status: DC | PRN
  Filled 2024-01-10: qty 21, 6d supply, fill #0

## 2024-01-10 NOTE — Telephone Encounter (Signed)
 Called pt to address concern.  Advised pt 3-6  months is advisable before having dental work.  Pt reports stent placed in 2021.   Pt reports Wegovy  dose is not working was told to call the office if she had any issues with medication.  Will send to Pharmacy clinic to f/u.

## 2024-01-11 NOTE — Telephone Encounter (Signed)
 Left message for patient to call back

## 2024-01-14 ENCOUNTER — Ambulatory Visit: Admitting: Internal Medicine

## 2024-01-14 ENCOUNTER — Telehealth: Payer: Self-pay

## 2024-01-14 NOTE — Telephone Encounter (Signed)
 Called pt to see if everything was okay with the link for the video visit to join

## 2024-01-15 ENCOUNTER — Telehealth: Payer: Self-pay

## 2024-01-15 ENCOUNTER — Ambulatory Visit (INDEPENDENT_AMBULATORY_CARE_PROVIDER_SITE_OTHER): Admitting: Internal Medicine

## 2024-01-15 ENCOUNTER — Encounter: Payer: Self-pay | Admitting: Internal Medicine

## 2024-01-15 VITALS — BP 121/57 | HR 60 | Ht 59.0 in | Wt 176.0 lb

## 2024-01-15 DIAGNOSIS — J439 Emphysema, unspecified: Secondary | ICD-10-CM

## 2024-01-15 DIAGNOSIS — Z122 Encounter for screening for malignant neoplasm of respiratory organs: Secondary | ICD-10-CM

## 2024-01-15 DIAGNOSIS — Z6835 Body mass index (BMI) 35.0-35.9, adult: Secondary | ICD-10-CM | POA: Diagnosis not present

## 2024-01-15 DIAGNOSIS — J4489 Other specified chronic obstructive pulmonary disease: Secondary | ICD-10-CM

## 2024-01-15 DIAGNOSIS — G4733 Obstructive sleep apnea (adult) (pediatric): Secondary | ICD-10-CM

## 2024-01-15 NOTE — Telephone Encounter (Signed)
 Pts annual lung cancer screening CT has not been scheduled. Dr. Dione Franks would like it to be scheduled in June 2025. Can this please be scheduled

## 2024-01-15 NOTE — Telephone Encounter (Signed)
 LVM to call and schedule annual LCS and SDMV.

## 2024-01-15 NOTE — Progress Notes (Signed)
 Wendy [name]    [id number]    [birth year]  Primary Care Physician:Varadarajan, Willena Harp, MD Date of Appointment: 01/15/2024 Established Patient Visit  Chief complaint:   Chief Complaint  Patient presents with   Follow-up    Cpap f/u     HPI: Wendy [name] is a 58 y.o. woman with copd/emphysema, mild aortic insufficiency, and OSA.   Interval Updates: Here for follow up for OSA.  She is here for sleep apnea follow up. Feels much improved with cpap therapy.   CPAP download reviewed 97% adherence. AHI 0.7, median pressure 6.8 cm H20 but on autocpap 5-15   Still taking symbicort  2 twice daily, minimal albuterol  use. 1-2 times/week.  No exacerbations or hospitalizations  I have reviewed the patient's family social and past medical history and updated as appropriate.   Past Medical History:  Diagnosis Date   Anxiety    Asthma    daily and prn inhalers   Cervical disc disease    decreased range of motion   Chronic pain    shoulders, neck, lower back   Coronary artery disease    Deformity, hand    left   Depression    Eczema    Endometriosis    Lupron  injection Q 3 mos.   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   Urticaria    Wears partial dentures    upper partial    Past Surgical History:  Procedure Laterality Date   ANKLE RECONSTRUCTION  02/01/2012   Procedure: RECONSTRUCTION ANKLE;  Surgeon: Amada Backer, MD;  Location: [facility];  Service: Orthopedics;  Laterality: Right;  Excision of right calcaneonavicular coalition with autologus fat graft interposition   BRACHIAL PLEXUS EXPLORATION     CARDIAC CATHETERIZATION     COLONOSCOPY WITH PROPOFOL  N/A 02/16/2023   Procedure: COLONOSCOPY WITH PROPOFOL ;  Surgeon: Alvis Jourdain,  MD;  Location: [facility];  Service: Gastroenterology;  Laterality: N/A;   CORONARY STENT INTERVENTION N/A 07/15/2020   Procedure: CORONARY STENT INTERVENTION;  Surgeon: Lucendia Rusk, MD;  Location[facility];  Service: Cardiovascular;  Laterality: N/A;   DIAGNOSTIC LAPAROSCOPY  10/02/2008   peritoneal bx.   LEFT HEART CATH AND CORONARY ANGIOGRAPHY N/A 07/15/2020   Procedure: LEFT HEART CATH AND CORONARY ANGIOGRAPHY;  Surgeon: Lucendia Rusk, MD;  Location: [facility] INVASIVE CV LAB;  Service: Cardiovascular;  Laterality: N/A;   MULTIPLE TOOTH EXTRACTIONS     upper teeth and wisdom teeth   OVARIAN CYST REMOVAL  2000   RIB RESECTION     left - cervical rib removal   SHOULDER ARTHROSCOPY W/ ROTATOR CUFF REPAIR  07/2011   right   SHOULDER SURGERY     left   TOOTH EXTRACTION     x 1    Family History  Problem Relation Age of Onset   Heart failure Mother    Cancer Mother    COPD Mother    Dementia Father    Stroke Father    CAD Father    Heart failure Sister     Social History   Occupational History   Not on file  Tobacco Use   Smoking status: Former    Current  packs/day: 0.00    Average packs/day: 1 pack/day for 25.0 years (25.0 ttl pk-yrs)    Types: Cigarettes    Start date: 07/15/1995    Quit date: 07/14/2020    Years since quitting: 3.5    Passive exposure: Never   Smokeless tobacco: Never  Vaping Use   Vaping status: Never Used  Substance and Sexual Activity   Alcohol use: No    Alcohol/week: 0.0 standard drinks of alcohol    Comment: occasional   Drug use: No   Sexual activity: Yes    Partners: Male    Birth control/protection: None     Physical Exam: Blood pressure (!) 121/57, pulse 60, height 4\' 11"  (1.499 m), weight 176 lb (79.8 kg), last menstrual period 07/12/2016, SpO2 99%.  Gen:      No acute distress ENT:  mmm no thrush Lungs:    ctab no wheeze breathing non labored CV:       RRR mild systolic murmur   Data  Reviewed: Imaging: I have personally reviewed the CT Chest lung cancer screening June 2024 - no concerning lung nodules/masses.   PFTs:     Latest Ref Rng & Units 08/02/2023   12:54 PM  PFT Results  FVC-Pre L 1.63   FVC-Predicted Pre % 58   FVC-Post L 1.41   FVC-Predicted Post % 50   Pre FEV1/FVC % % 84   Post FEV1/FCV % % 86   FEV1-Pre L 1.37   FEV1-Predicted Pre % 62   FEV1-Post L 1.21   DLCO uncorrected ml/min/mmHg 11.62   DLCO UNC% % 67   DLCO corrected ml/min/mmHg 11.62   DLCO COR %Predicted % 67   DLVA Predicted % 104   TLC L 2.91   TLC % Predicted % 67   RV % Predicted % 81    I have personally reviewed the patient's PFTs and PFTs show moderarte restriction to ventilation with reduced dlco likely secondary to body habitus. No airflow limitation.   Labs: Lab Results  Component Value Date   WBC 9.5 11/20/2022   HGB 11.6 (L) 11/20/2022   HCT 37.4 11/20/2022   MCV 88.8 11/20/2022   PLT 270 11/20/2022   Lab Results  Component Value Date   NA 144 10/16/2023   K 3.7 10/16/2023   CO2 22 10/16/2023   GLUCOSE 68 (L) 10/16/2023   BUN 12 10/16/2023   CREATININE 0.82 10/16/2023   CALCIUM  9.3 10/16/2023   EGFR 83 10/16/2023   GFRNONAA >60 11/20/2022     Immunization status: Immunization History  Administered Date(s) Administered   Influenza,inj,Quad PF,6+ Mos 06/15/2014   MMR 04/15/2002   Td 02/24/2002    External Records Personally Reviewed: cardiology, GI  Assessment:  Shortness of breath, multifactorial from: COPD, Gold Stage 0 Moderate OSA on CPAP AHI on home sleep test June 2024 was 21 events/hr Obesity on GLP1 Body mass index is 35.55 kg/m. CAD  Plan/Recommendations:  CT Scan - June 2025 for lung cancer screening was ordered. Please call our office if you haven't heard anything about this in a week.   For mild COPD -  Continue symbicort  2 puffs twice daily, gargle after use. Refilled today.   Continue albuterol  inhaler as needed.   Glad  you are wearing CPAP and having good benefit from the machine. Continue current settings.   Return to Care: Return in about 1 year (around 01/14/2025).   Louie Rover, MD Pulmonary and Critical Care Medicine [facility] Office:[phone]

## 2024-01-15 NOTE — Patient Instructions (Addendum)
 It was a pleasure to see you today!  Please schedule follow up with myself in 1 year.  If my schedule is not open yet, we will contact you with a reminder closer to that time. Please call [phone] if you haven't heard from us  a month before, and always call us  sooner if issues or concerns arise. You can also send us  a message through MyChart, but but aware that this is not to be used for urgent issues and it may take up to 5-7 days to receive a reply. Please be aware that you will likely be able to view your results before I have a chance to respond to them. Please give us  5 business days to respond to any non-urgent results.    Before your next visit I would like you to have: CT Scan - June 2025 for lung cancer screening. Please call our office if you haven't heard anything about this in a week.   For mild COPD -  Continue symbicort  2 puffs twice daily, gargle after use. Refilled today.   Continue albuterol  inhaler as needed.   Glad you are wearing CPAP and having good benefit from the machine. Continue current settings.

## 2024-01-18 ENCOUNTER — Encounter: Payer: Self-pay | Admitting: Emergency Medicine

## 2024-01-23 NOTE — Telephone Encounter (Signed)
 Pt called in and left VM regarding LCS. Attempted to contact pt but had to leave another VM for pt to call back.

## 2024-01-24 ENCOUNTER — Telehealth: Payer: Self-pay

## 2024-01-24 DIAGNOSIS — Z87891 Personal history of nicotine dependence: Secondary | ICD-10-CM

## 2024-01-24 DIAGNOSIS — Z122 Encounter for screening for malignant neoplasm of respiratory organs: Secondary | ICD-10-CM

## 2024-01-24 NOTE — Telephone Encounter (Signed)
 Lung Cancer Screening Narrative/Criteria Questionnaire (Cigarette Smokers Only- No Cigars/Pipes/vapes)   Wendy [name]   SDMV:02/14/2024 at 12:30 pm with Viktoria Gray, RN   [birth year]   LDCT: 02/22/2024 at 11:40 am at GI    58 y.o.   Phone: [phone]  Lung Screening Narrative (confirm age 59-77 yrs Medicare / 50-80 yrs Private pay insurance)   Insurance information:[employer]   Referring Provider: Dione Franks  This screening involves an initial phone call with a team member from our program. It is called a shared decision making visit. The initial meeting is required by  insurance and Medicare to make sure you understand the program. This appointment takes about 15-20 minutes to complete. You will complete the screening scan at your scheduled date/time.  This scan takes about 5-10 minutes to complete. You can eat and drink normally before and after the scan.  Criteria questions for Lung Cancer Screening:   Are you a current or former smoker? Former Age began smoking: 25   If you are a former smoker, what year did you quit smoking?Quit 2021 (within 15 yrs)   To calculate your smoking history, I need an accurate estimate of how many packs of cigarettes you smoked per day and for how many years. (Not just the number of PPD you are now smoking)   Years smoking 27 x Packs per day 1.5 = Pack years 40.5   (at least 20 pack yrs)   (Make sure they understand that we need to know how much they have smoked in the past, not just the number of PPD they are smoking now)  Do you have a personal history of cancer? No  Do you have a family history of cancer? Yes  (cancer type and and relative)  Grandmother Ovarian Mother Unknown Type  Are you coughing up blood?  No  Have you had unexplained weight loss of 15 lbs or more in the last 6 months? No  It looks like you meet all criteria.  When would be a good time for us  to schedule you for this screening?   Additional information: N/A

## 2024-01-29 NOTE — Telephone Encounter (Signed)
 Attempted to call patient regarding Wegovy . No answer, VM full

## 2024-02-07 ENCOUNTER — Other Ambulatory Visit (HOSPITAL_COMMUNITY): Payer: Self-pay

## 2024-02-07 MED ORDER — DOXYCYCLINE MONOHYDRATE 100 MG PO TABS
200.0000 mg | ORAL_TABLET | Freq: Once | ORAL | 0 refills | Status: AC
  Filled 2024-02-07: qty 2, 1d supply, fill #0

## 2024-02-10 DIAGNOSIS — G4733 Obstructive sleep apnea (adult) (pediatric): Secondary | ICD-10-CM | POA: Diagnosis not present

## 2024-02-11 ENCOUNTER — Other Ambulatory Visit: Payer: Self-pay | Admitting: Nurse Practitioner

## 2024-02-11 DIAGNOSIS — Z6841 Body Mass Index (BMI) 40.0 and over, adult: Secondary | ICD-10-CM

## 2024-02-12 ENCOUNTER — Other Ambulatory Visit (HOSPITAL_COMMUNITY): Payer: Self-pay

## 2024-02-12 MED ORDER — WEGOVY 2.4 MG/0.75ML ~~LOC~~ SOAJ
2.4000 mg | SUBCUTANEOUS | 5 refills | Status: DC
  Filled 2024-02-12: qty 3, 28d supply, fill #0
  Filled 2024-04-11: qty 3, 28d supply, fill #1

## 2024-02-14 ENCOUNTER — Ambulatory Visit: Admitting: *Deleted

## 2024-02-14 ENCOUNTER — Encounter: Payer: Self-pay | Admitting: *Deleted

## 2024-02-14 DIAGNOSIS — Z87891 Personal history of nicotine dependence: Secondary | ICD-10-CM | POA: Diagnosis not present

## 2024-02-14 NOTE — Progress Notes (Addendum)
 Virtual Visit via Telephone Note  I connected with Wendy [name] on 02/14/24 at 12:30 PM EDT by telephone and verified that I am speaking with the correct person using two identifiers.  Location: Patient: in home Provider: [address] [place], [place], [address]   Shared Decision Making Visit Lung Cancer Screening Program [phone])   Eligibility: Age 58 y.o. Pack Years Smoking History Calculation 40.5 (# packs/per year x # years smoked) Recent History of coughing up blood  no Unexplained weight loss? no ( >Than 15 pounds within the last 6 months ) Prior History Lung / other cancer no (Diagnosis within the last 5 years already requiring surveillance chest CT Scans). Smoking Status Former Smoker Former Smokers: Years since quit: 4 years  Quit Date: 2021  Visit Components: Discussion included one or more decision making aids. yes Discussion included risk/benefits of screening. yes Discussion included potential follow up diagnostic testing for abnormal scans. yes Discussion included meaning and risk of over diagnosis. yes Discussion included meaning and risk of False Positives. yes Discussion included meaning of total radiation exposure. yes  Counseling Included: Importance of adherence to annual lung cancer LDCT screening. yes Impact of comorbidities on ability to participate in the program. yes Ability and willingness to under diagnostic treatment. yes  Smoking Cessation Counseling: Current Smokers:  Discussed importance of smoking cessation. yes Information about tobacco cessation classes and interventions provided to patient. yes Patient provided with ticket for LDCT Scan. yes Symptomatic Patient. no  Counseling\ NA Diagnosis Code: Tobacco Use Z72.0 Asymptomatic Patient yes  Counseling (Intermediate counseling: > three minutes counseling) H9563 Former Smokers:  Discussed the importance of maintaining cigarette abstinence. yes Diagnosis Code: Personal  History of Nicotine Dependence. S12.108 Information about tobacco cessation classes and interventions provided to patient. Yes Patient provided with ticket for LDCT Scan. yes Written Order for Lung Cancer Screening with LDCT placed in Epic. Yes (CT Chest Lung Cancer Screening Low Dose W/O CM) [id number] Z12.2-Screening of respiratory organs Z87.891-Personal history of nicotine dependence  Provider Attestation I agree with the documentation of the Shared Decision Making visit,  smoking cessation counseling if appropriate, and verification or eligibility for lung cancer screening as documented by the RN Nurse Navigator.   Lauraine PHEBE Lites, MSN, AGACNP-BC [place] Pulmonary/Critical Care Medicine See Amion for personal pager PCCM on call pager [phone]     Josette Ranger, RN 02/14/24

## 2024-02-14 NOTE — Patient Instructions (Signed)

## 2024-02-19 ENCOUNTER — Other Ambulatory Visit (HOSPITAL_COMMUNITY): Payer: Self-pay

## 2024-02-19 MED ORDER — ALBUTEROL SULFATE HFA 108 (90 BASE) MCG/ACT IN AERS
2.0000 | INHALATION_SPRAY | RESPIRATORY_TRACT | 3 refills | Status: DC
  Filled 2024-02-19: qty 6.7, 17d supply, fill #0

## 2024-02-20 ENCOUNTER — Other Ambulatory Visit (HOSPITAL_COMMUNITY): Payer: Self-pay

## 2024-02-22 ENCOUNTER — Ambulatory Visit
Admission: RE | Admit: 2024-02-22 | Discharge: 2024-02-22 | Disposition: A | Discharge status: Home / Self Care | Source: Ambulatory Visit | Attending: Internal Medicine | Admitting: Internal Medicine

## 2024-02-22 DIAGNOSIS — Z122 Encounter for screening for malignant neoplasm of respiratory organs: Secondary | ICD-10-CM | POA: Diagnosis not present

## 2024-02-22 DIAGNOSIS — Z87891 Personal history of nicotine dependence: Secondary | ICD-10-CM

## 2024-02-25 DIAGNOSIS — M26629 Arthralgia of temporomandibular joint, unspecified side: Secondary | ICD-10-CM | POA: Diagnosis not present

## 2024-02-25 DIAGNOSIS — I252 Old myocardial infarction: Secondary | ICD-10-CM | POA: Diagnosis not present

## 2024-02-25 DIAGNOSIS — I2510 Atherosclerotic heart disease of native coronary artery without angina pectoris: Secondary | ICD-10-CM | POA: Diagnosis not present

## 2024-02-28 ENCOUNTER — Other Ambulatory Visit (HOSPITAL_COMMUNITY): Payer: Self-pay

## 2024-02-28 DIAGNOSIS — Z1231 Encounter for screening mammogram for malignant neoplasm of breast: Secondary | ICD-10-CM | POA: Diagnosis not present

## 2024-02-28 DIAGNOSIS — G4733 Obstructive sleep apnea (adult) (pediatric): Secondary | ICD-10-CM | POA: Diagnosis not present

## 2024-02-28 MED ORDER — NAPROXEN 500 MG PO TABS
500.0000 mg | ORAL_TABLET | Freq: Two times a day (BID) | ORAL | 0 refills | Status: DC | PRN
  Filled 2024-02-28 – 2024-03-11 (×2): qty 14, 7d supply, fill #0

## 2024-03-01 DIAGNOSIS — G4733 Obstructive sleep apnea (adult) (pediatric): Secondary | ICD-10-CM | POA: Diagnosis not present

## 2024-03-10 ENCOUNTER — Ambulatory Visit: Payer: Self-pay | Admitting: Internal Medicine

## 2024-03-10 ENCOUNTER — Other Ambulatory Visit: Payer: Self-pay | Admitting: Acute Care

## 2024-03-10 DIAGNOSIS — Z122 Encounter for screening for malignant neoplasm of respiratory organs: Secondary | ICD-10-CM

## 2024-03-10 DIAGNOSIS — Z87891 Personal history of nicotine dependence: Secondary | ICD-10-CM

## 2024-03-11 ENCOUNTER — Other Ambulatory Visit (HOSPITAL_COMMUNITY): Payer: Self-pay

## 2024-03-17 ENCOUNTER — Other Ambulatory Visit (HOSPITAL_COMMUNITY): Payer: Self-pay

## 2024-03-18 ENCOUNTER — Other Ambulatory Visit (HOSPITAL_COMMUNITY): Payer: Self-pay

## 2024-03-18 DIAGNOSIS — G4733 Obstructive sleep apnea (adult) (pediatric): Secondary | ICD-10-CM | POA: Diagnosis not present

## 2024-04-01 DIAGNOSIS — G4733 Obstructive sleep apnea (adult) (pediatric): Secondary | ICD-10-CM | POA: Diagnosis not present

## 2024-04-03 ENCOUNTER — Emergency Department (HOSPITAL_COMMUNITY)
Admission: EM | Admit: 2024-04-03 | Discharge: 2024-04-03 | Discharge status: Against Medical Advice | Attending: Emergency Medicine | Admitting: Emergency Medicine

## 2024-04-03 DIAGNOSIS — S61219A Laceration without foreign body of unspecified finger without damage to nail, initial encounter: Secondary | ICD-10-CM | POA: Insufficient documentation

## 2024-04-03 DIAGNOSIS — X58XXXA Exposure to other specified factors, initial encounter: Secondary | ICD-10-CM | POA: Insufficient documentation

## 2024-04-03 DIAGNOSIS — Z5321 Procedure and treatment not carried out due to patient leaving prior to being seen by health care provider: Secondary | ICD-10-CM | POA: Insufficient documentation

## 2024-04-03 NOTE — ED Notes (Signed)
 Pt walked out of triage before nurse can triage. Pt asked where she was going and Pt stated I'm going back home. RN notified

## 2024-04-09 ENCOUNTER — Encounter (HOSPITAL_COMMUNITY): Payer: Self-pay | Admitting: Emergency Medicine

## 2024-04-09 ENCOUNTER — Other Ambulatory Visit: Payer: Self-pay

## 2024-04-09 ENCOUNTER — Emergency Department (HOSPITAL_COMMUNITY)
Admission: EM | Admit: 2024-04-09 | Discharge: 2024-04-09 | Disposition: A | Discharge status: Home / Self Care | Attending: Emergency Medicine | Admitting: Emergency Medicine

## 2024-04-09 ENCOUNTER — Other Ambulatory Visit (HOSPITAL_COMMUNITY): Payer: Self-pay

## 2024-04-09 DIAGNOSIS — J45909 Unspecified asthma, uncomplicated: Secondary | ICD-10-CM | POA: Diagnosis not present

## 2024-04-09 DIAGNOSIS — Z87891 Personal history of nicotine dependence: Secondary | ICD-10-CM | POA: Diagnosis not present

## 2024-04-09 DIAGNOSIS — R0902 Hypoxemia: Secondary | ICD-10-CM | POA: Diagnosis not present

## 2024-04-09 DIAGNOSIS — Z91040 Latex allergy status: Secondary | ICD-10-CM | POA: Diagnosis not present

## 2024-04-09 DIAGNOSIS — E876 Hypokalemia: Secondary | ICD-10-CM | POA: Insufficient documentation

## 2024-04-09 DIAGNOSIS — T782XXA Anaphylactic shock, unspecified, initial encounter: Secondary | ICD-10-CM | POA: Diagnosis not present

## 2024-04-09 DIAGNOSIS — L509 Urticaria, unspecified: Secondary | ICD-10-CM | POA: Diagnosis not present

## 2024-04-09 DIAGNOSIS — I10 Essential (primary) hypertension: Secondary | ICD-10-CM | POA: Insufficient documentation

## 2024-04-09 DIAGNOSIS — Z7982 Long term (current) use of aspirin: Secondary | ICD-10-CM | POA: Insufficient documentation

## 2024-04-09 DIAGNOSIS — I2510 Atherosclerotic heart disease of native coronary artery without angina pectoris: Secondary | ICD-10-CM | POA: Insufficient documentation

## 2024-04-09 DIAGNOSIS — T7840XA Allergy, unspecified, initial encounter: Secondary | ICD-10-CM | POA: Diagnosis not present

## 2024-04-09 DIAGNOSIS — L299 Pruritus, unspecified: Secondary | ICD-10-CM | POA: Diagnosis not present

## 2024-04-09 DIAGNOSIS — Z91010 Allergy to peanuts: Secondary | ICD-10-CM | POA: Diagnosis not present

## 2024-04-09 DIAGNOSIS — Z7951 Long term (current) use of inhaled steroids: Secondary | ICD-10-CM | POA: Diagnosis not present

## 2024-04-09 LAB — CBC WITH DIFFERENTIAL/PLATELET
Abs Immature Granulocytes: 0.02 K/uL (ref 0.00–0.07)
Basophils Absolute: 0 K/uL (ref 0.0–0.1)
Basophils Relative: 0 %
Eosinophils Absolute: 0.1 K/uL (ref 0.0–0.5)
Eosinophils Relative: 1 %
HCT: 39.4 % (ref 36.0–46.0)
Hemoglobin: 12.6 g/dL (ref 12.0–15.0)
Immature Granulocytes: 0 %
Lymphocytes Relative: 58 %
Lymphs Abs: 5.5 K/uL — ABNORMAL HIGH (ref 0.7–4.0)
MCH: 28.3 pg (ref 26.0–34.0)
MCHC: 32 g/dL (ref 30.0–36.0)
MCV: 88.3 fL (ref 80.0–100.0)
Monocytes Absolute: 0.9 K/uL (ref 0.1–1.0)
Monocytes Relative: 9 %
Neutro Abs: 3.1 K/uL (ref 1.7–7.7)
Neutrophils Relative %: 32 %
Platelets: 303 K/uL (ref 150–400)
RBC: 4.46 MIL/uL (ref 3.87–5.11)
RDW: 12.9 % (ref 11.5–15.5)
WBC: 9.6 K/uL (ref 4.0–10.5)
nRBC: 0 % (ref 0.0–0.2)

## 2024-04-09 LAB — BASIC METABOLIC PANEL WITH GFR
Anion gap: 10 (ref 5–15)
BUN: 10 mg/dL (ref 6–20)
CO2: 20 mmol/L — ABNORMAL LOW (ref 22–32)
Calcium: 8.9 mg/dL (ref 8.9–10.3)
Chloride: 108 mmol/L (ref 98–111)
Creatinine, Ser: 0.79 mg/dL (ref 0.44–1.00)
GFR, Estimated: >60 mL/min (ref >60)
Glucose, Bld: 153 mg/dL — ABNORMAL HIGH (ref 70–99)
Potassium: 3.3 mmol/L — ABNORMAL LOW (ref 3.5–5.1)
Sodium: 138 mmol/L (ref 135–145)

## 2024-04-09 MED ORDER — EPINEPHRINE 0.3 MG/0.3ML IJ SOAJ
0.3000 mg | INTRAMUSCULAR | 1 refills | Status: AC | PRN
  Filled 2024-04-09: qty 2, 30d supply, fill #0

## 2024-04-09 MED ORDER — PREDNISONE 10 MG PO TABS
ORAL_TABLET | ORAL | 0 refills | Status: AC
  Filled 2024-04-09 (×2): qty 42, 12d supply, fill #0

## 2024-04-09 MED ORDER — METHYLPREDNISOLONE SODIUM SUCC 125 MG IJ SOLR
125.0000 mg | Freq: Once | INTRAMUSCULAR | Status: AC
  Administered 2024-04-09: 125 mg via INTRAVENOUS
  Filled 2024-04-09: qty 2

## 2024-04-09 MED ORDER — FAMOTIDINE IN NACL 20-0.9 MG/50ML-% IV SOLN
20.0000 mg | Freq: Once | INTRAVENOUS | Status: AC
  Administered 2024-04-09: 20 mg via INTRAVENOUS
  Filled 2024-04-09: qty 50

## 2024-04-09 MED ORDER — CETIRIZINE HCL 10 MG PO TABS
10.0000 mg | ORAL_TABLET | Freq: Every day | ORAL | 0 refills | Status: DC
  Filled 2024-04-09: qty 14, 14d supply, fill #0

## 2024-04-09 MED ORDER — SODIUM CHLORIDE 0.9 % IV BOLUS
1000.0000 mL | Freq: Once | INTRAVENOUS | Status: AC
  Administered 2024-04-09: 1000 mL via INTRAVENOUS

## 2024-04-09 MED ORDER — POTASSIUM CHLORIDE CRYS ER 20 MEQ PO TBCR
40.0000 meq | EXTENDED_RELEASE_TABLET | Freq: Once | ORAL | Status: AC
  Administered 2024-04-09: 40 meq via ORAL
  Filled 2024-04-09: qty 2

## 2024-04-09 NOTE — ED Provider Notes (Signed)
 [facility] EMERGENCY DEPARTMENT AT [facility] Provider Note  CSN: [id number] Arrival date & time: 04/09/24 1444  Chief Complaint(s) Allergic Reaction  HPI Wendy [name] is a 58 y.o. female with past medical history as below, significant for asthma, anxiety, chronic pain, eczema, hypertension, hyperlipidemia who presents to the ED with complaint of allergic reaction  Just prior to arrival patient reports she was stung on her wrist by a wasp which she has known allergy  to.  She took Benadryl  50 mg prior to EMS arrival.  EMS gave her IM epi x 2 and 50 mg more Benadryl .  Patient reports mild improvement to her symptoms, feels her facial swelling has improved, hives have somewhat improved.  She feels as though her throat is scratchy and she is having difficulty swallowing.  Seems to be improving somewhat.  No nausea or vomiting.  Normal state of health prior to insect sting  Past Medical History Past Medical History:  Diagnosis Date   Anxiety    Asthma    daily and prn inhalers   Cervical disc disease    decreased range of motion   Chronic pain    shoulders, neck, lower back   Coronary artery disease    Deformity, hand    left   Depression    Eczema    Endometriosis    Lupron  injection Q 3 mos.   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   Urticaria    Wears partial dentures    upper partial   Patient Active Problem List   Diagnosis Date Noted   Morbid obesity with BMI of 40.0-44.9, adult (HCC) 02/19/2023   Nonrheumatic aortic valve insufficiency 02/13/2022   Melena 01/24/2021   DOE (dyspnea on exertion) 01/24/2021   PVC (premature ventricular contraction) 10/14/2020   Coronary artery disease involving native coronary artery of native heart without angina  pectoris 07/30/2020   Essential hypertension 07/30/2020   Hyperlipidemia 07/30/2020   Gastroesophageal reflux disease without esophagitis 07/30/2020   Nonrheumatic mitral valve regurgitation 07/30/2020   BV (bacterial vaginosis) 03/06/2013   ANXIETY 11/05/2007   DEPRESSION 11/05/2007   NAUSEA, CHRONIC 11/05/2007   FIBROIDS, UTERUS 03/26/2007   MIGRAINE, COMMON 03/26/2007   OVARIAN CYST 03/26/2007   DEGENERATIVE DISC DISEASE, CERVICAL SPINE 03/26/2007   DEGENERATIVE DISC DISEASE, LUMBOSACRAL SPINE 03/26/2007   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   Home Medication(s) Prior to Admission medications   Medication Sig Start Date End Date Taking? Authorizing Provider  acetaminophen  (TYLENOL ) 325 MG tablet Take 2 tablets (650 mg total) by mouth every 6 (six) hours as needed. 10/01/20   Mesner, Selinda, MD  albuterol  (PROVENTIL  HFA;VENTOLIN  HFA) 108 (90 Base) MCG/ACT inhaler Inhale 2 puffs into the lungs every 4 (four) hours as needed for wheezing or shortness of breath (cough). 11/20/16   Street, Jamul, PA-C  albuterol  (VENTOLIN  HFA) 108 (90 Base) MCG/ACT inhaler Inhale 2 puffs into the lungs every 4 (four) hours. 02/19/24     aspirin  EC 81 MG EC tablet Take 1 tablet (81 mg total) by mouth daily. Swallow whole. 07/17/20   Bhagat, Aleene, PA  azelastine  (ASTELIN ) 0.1 % nasal spray Place 1 spray into both nostrils 2 (two) times daily as needed for rhinitis. 11/20/23   Tobie Arleta SQUIBB,  MD  budesonide -formoterol  (SYMBICORT ) 80-4.5 MCG/ACT inhaler Inhale 2 puffs into the lungs once  daily. 08/02/23   Meade Verdon RAMAN, MD  busPIRone  (BUSPAR ) 5 MG tablet Take 1 tablet (5 mg total) by mouth 2 (two) times daily. 01/02/23     diltiazem  (CARTIA  XT) 120 MG 24 hr capsule Take 1 capsule (120 mg total) by mouth daily. 01/24/23 02/27/24  Santo Stanly LABOR, MD  EPINEPHrine  (EPIPEN  2-PAK) 0.3 mg/0.3 mL IJ SOAJ injection Inject 0.3 mg into the muscle as needed for anaphylaxis. 05/02/23   Tobie Arleta SQUIBB, MD  Evolocumab   (REPATHA  SURECLICK) 140 MG/ML SOAJ Inject 140 mg into the skin every 14 (fourteen) days. 11/14/23   Santo Stanly LABOR, MD  fexofenadine  (ALLEGRA  ALLERGY ) 180 MG tablet Take 1 tablet (180 mg total) by mouth 2 (two) times daily as needed for allergies (hives). 11/20/23   Tobie Arleta SQUIBB, MD  fluticasone  (FLONASE ) 50 MCG/ACT nasal spray Place 2 sprays into both nostrils daily. 11/20/23   Tobie Arleta SQUIBB, MD  furosemide  (LASIX ) 20 MG tablet Take 0.5 tablets (10 mg total) by mouth daily as needed for fluid retention or swelling 12/06/21   Santo Stanly A, MD  gabapentin  (NEURONTIN ) 100 MG capsule Take 1 capsule (100 mg total) by mouth at bedtime for 5 days. 10/30/23 11/04/23    gabapentin  (NEURONTIN ) 600 MG tablet Take 600 mg by mouth at bedtime.    [provider]  HYDROcodone -acetaminophen  (NORCO) 10-325 MG tablet Take 1 tablet by mouth every 6 (six) hours as needed for pain. 01/03/24     HYDROcodone -acetaminophen  (NORCO) 10-325 MG tablet Take 1 tablet by mouth every 6 (six) hours as needed. Patient not taking: Reported on 01/15/2024 01/10/24     HYDROcodone -acetaminophen  (NORCO/VICODIN) 5-325 MG tablet Take 1 tablet by mouth every 8 (eight) hours as needed. Patient not taking: Reported on 01/15/2024 05/18/23     hydrOXYzine  (ATARAX /VISTARIL ) 25 MG tablet Take 25 mg by mouth 3 (three) times daily as needed for anxiety or itching.  10/09/19   [provider]  ibuprofen  (ADVIL ) 800 MG tablet Take 800 mg by mouth every 6 (six) hours as needed for moderate pain. for pain 11/04/22   [provider]  ibuprofen  (ADVIL ) 800 MG tablet Take 1 tablet (800 mg total) by mouth every 6 (six) hours as needed for pain. Patient not taking: Reported on 01/15/2024 01/03/24     ibuprofen  (ADVIL ) 800 MG tablet Take 1 tablet (800 mg total) by mouth every 6 (six) hours as needed for pain. Patient not taking: Reported on 01/15/2024 01/10/24     isosorbide  mononitrate (IMDUR ) 60 MG 24 hr tablet Take 1 tablet (60  mg total) by mouth daily. 11/27/23   Chandrasekhar, Stanly LABOR, MD  ketoconazole  (NIZORAL ) 2 % cream Apply 1 application topically 2 (two) times daily for 14 days. 12/09/21     lidocaine  (XYLOCAINE ) 5 % ointment Apply 1 application topically as needed. 09/02/21   Bernis Ernst, PA-C  naproxen  (NAPROSYN ) 500 MG tablet Take 1 tablet (500 mg total) by mouth 2 (two) times daily. Patient taking differently: Take 500 mg by mouth daily as needed for mild pain (pain score 1-3) or moderate pain (pain score 4-6). 06/02/22   Raford Lenis, MD  naproxen  (NAPROSYN ) 500 MG tablet Take 1 tablet (500 mg total) by mouth every 12 (twelve) hours as needed with food or milk. 02/28/24     nitroGLYCERIN  (NITROSTAT ) 0.4 MG SL tablet PLACE 1 TABLET (0.4 MG TOTAL) UNDER THE TONGUE EVERY 5 (  FIVE) MINUTES FOR 3 DOSES AS NEEDED FOR CHEST PAIN. 11/27/23   Santo Stanly LABOR, MD  oxyCODONE  (ROXICODONE ) 5 MG immediate release tablet Take 1 tablet (5 mg total) by mouth every 4 (four) hours as needed for severe pain. 06/02/22   Raford Lenis, MD  pantoprazole  (PROTONIX ) 20 MG tablet Take 1 tablet (20 mg total) by mouth daily. 11/20/23   Tobie Arleta SQUIBB, MD  polyethylene glycol (MIRALAX  / GLYCOLAX ) 17 g packet Take 17 g by mouth every other day.    [provider]  potassium chloride  SA (KLOR-CON  M) 20 MEQ tablet Take one tablet twice a day for ten days, then one tablet daily 11/20/22   Raford Lenis, MD  pregabalin  (LYRICA ) 100 MG capsule Take 1 capsule (100 mg total) by mouth 2 (two) times daily. 08/07/23     rosuvastatin  (CRESTOR ) 40 MG tablet Take 1 tablet (40 mg total) by mouth daily. 08/13/23     Semaglutide -Weight Management (WEGOVY ) 2.4 MG/0.75ML SOAJ Inject 2.4 mg into the skin once a week. 02/12/24   Santo Stanly LABOR, MD  senna (SENOKOT) 8.6 MG TABS tablet Take 1 tablet by mouth daily as needed for moderate constipation.    [provider]  tiZANidine  (ZANAFLEX ) 4 MG tablet Take 1 tablet (4 mg total) by mouth  every 6 (six) hours as needed for muscle spasms. 06/02/22   Raford Lenis, MD  triamcinolone  cream (KENALOG ) 0.1 % Apply 1 Application topically 2 (two) times daily. 07/12/22   Levander Houston, MD  valACYclovir  (VALTREX ) 1000 MG tablet Take 1 tablet (1,000 mg total) by mouth daily. 12/25/23                                                                                                                                       Past Surgical History Past Surgical History:  Procedure Laterality Date   ANKLE RECONSTRUCTION  02/01/2012   Procedure: RECONSTRUCTION ANKLE;  Surgeon: Norleen Armor, MD;  Location: [facility];  Service: Orthopedics;  Laterality: Right;  Excision of right calcaneonavicular coalition with autologus fat graft interposition   BRACHIAL PLEXUS EXPLORATION     CARDIAC CATHETERIZATION     COLONOSCOPY WITH PROPOFOL  N/A 02/16/2023   Procedure: COLONOSCOPY WITH PROPOFOL ;  Surgeon: Rollin Dover, MD;  Location: [facility];  Service: Gastroenterology;  Laterality: N/A;   CORONARY STENT INTERVENTION N/A 07/15/2020   Procedure: CORONARY STENT INTERVENTION;  Surgeon: Dann Candyce RAMAN, MD;  Location: [facility] INVASIVE CV LAB;  Service: Cardiovascular;  Laterality: N/A;   DIAGNOSTIC LAPAROSCOPY  10/02/2008   peritoneal bx.   LEFT HEART CATH AND CORONARY ANGIOGRAPHY N/A 07/15/2020   Procedure: LEFT HEART CATH AND CORONARY ANGIOGRAPHY;  Surgeon: Dann Candyce RAMAN, MD;  Location: [facility] INVASIVE CV LAB;  Service: Cardiovascular;  Laterality: N/A;   MULTIPLE TOOTH EXTRACTIONS     upper teeth and wisdom teeth   OVARIAN CYST REMOVAL  2000  RIB RESECTION     left - cervical rib removal   SHOULDER ARTHROSCOPY W/ ROTATOR CUFF REPAIR  07/2011   right   SHOULDER SURGERY     left   TOOTH EXTRACTION     x 1   Family History Family History  Problem Relation Age of Onset   Heart failure Mother    Cancer Mother    COPD Mother    Dementia Father    Stroke Father    CAD Father    Heart  failure Sister     Social History Social History   Tobacco Use   Smoking status: Former    Current packs/day: 0.00    Average packs/day: 1 pack/day for 25.0 years (25.0 ttl pk-yrs)    Types: Cigarettes    Start date: 07/15/1995    Quit date: 07/14/2020    Years since quitting: 3.7    Passive exposure: Never   Smokeless tobacco: Never  Vaping Use   Vaping status: Never Used  Substance Use Topics   Alcohol use: No    Alcohol/week: 0.0 standard drinks of alcohol    Comment: occasional   Drug use: No   Allergies Pfizer-biontech covid-19 vacc [covid-19 mrna vacc (moderna)], Wasp venom, Chocolate, Iodine, Ivp dye [iodinated contrast media], Latex, Peanut (diagnostic), Shellfish allergy , Soap & cleansers, Eicosapentaenoic acid, Fish-derived products, Peanut-containing drug products, and Soap  Review of Systems A thorough review of systems was obtained and all systems are negative except as noted in the HPI and PMH.   Physical Exam Vital Signs  I have reviewed the triage vital signs BP (!) 128/56   Pulse (!) 46   Temp 97.8 F (36.6 C) (Oral)   Resp 20   Ht 4' 11 (1.499 m)   Wt 78.9 kg   LMP 07/12/2016 (Approximate) Comment: 2-3 day cycle  SpO2 98%   BMI 35.14 kg/m  Physical Exam Vitals and nursing note reviewed.  Constitutional:      Appearance: Normal appearance. She is not toxic-appearing or diaphoretic.  HENT:     Head: Normocephalic and atraumatic.     Comments: No stridor, uvula is midline No angioedema    Right Ear: External ear normal.     Left Ear: External ear normal.     Nose: Nose normal.     Mouth/Throat:     Mouth: Mucous membranes are moist.  Eyes:     General: No scleral icterus.       Right eye: No discharge.        Left eye: No discharge.  Cardiovascular:     Rate and Rhythm: Normal rate and regular rhythm.     Pulses: Normal pulses.     Heart sounds: Normal heart sounds.  Pulmonary:     Effort: Pulmonary effort is normal. No respiratory  distress.     Breath sounds: Normal breath sounds. No stridor.  Abdominal:     General: Abdomen is flat. There is no distension.     Palpations: Abdomen is soft.     Tenderness: There is no abdominal tenderness.  Musculoskeletal:     Cervical back: No rigidity.     Right lower leg: No edema.     Left lower leg: No edema.  Skin:    General: Skin is warm and dry.     Capillary Refill: Capillary refill takes less than 2 seconds.     Comments: Diffuse urticaria to torso and extremities  Neurological:     Mental Status: She is alert.  Psychiatric:        Mood and Affect: Mood normal.        Behavior: Behavior normal. Behavior is cooperative.     ED Results and Treatments Labs (all labs ordered are listed, but only abnormal results are displayed) Labs Reviewed  CBC WITH DIFFERENTIAL/PLATELET - Abnormal; Notable for the following components:      Result Value   Lymphs Abs 5.5 (*)    All other components within normal limits  BASIC METABOLIC PANEL WITH GFR - Abnormal; Notable for the following components:   Potassium 3.3 (*)    CO2 20 (*)    Glucose, Bld 153 (*)    All other components within normal limits                                                                                                                          Radiology No results found.  Pertinent labs & imaging results that were available during my care of the patient were reviewed by me and considered in my medical decision making (see MDM for details).  Medications Ordered in ED Medications  potassium chloride  SA (KLOR-CON  M) CR tablet 40 mEq (has no administration in time range)  methylPREDNISolone  sodium succinate (SOLU-MEDROL ) 125 mg/2 mL injection 125 mg (125 mg Intravenous Given 04/09/24 1500)  famotidine  (PEPCID ) IVPB 20 mg premix (0 mg Intravenous Stopped 04/09/24 1530)  sodium chloride  0.9 % bolus 1,000 mL (0 mLs Intravenous Stopped 04/09/24 1640)                                                                                                                                      Procedures Procedures  (including critical care time)  Medical Decision Making / ED Course    Medical Decision Making:    Wendy [name] is a 58 y.o. female with past medical history as below, significant for asthma, anxiety, chronic pain, eczema, hypertension, hyperlipidemia who presents to the ED with complaint of allergic reaction. The complaint involves an extensive differential diagnosis and also carries with it a high risk of complications and morbidity.  Serious etiology was considered. Ddx includes but is not limited to: Allergic reaction, anaphylaxis, contact dermatitis, irritant dermatitis, etc.  Complete initial physical exam performed, notably the patient was in mild stress, diffuse urticaria noted, she is HDS.SABRA    Reviewed and confirmed nursing documentation for past medical  history, family history, social history.  Vital signs reviewed.    Anaphylaxis> - Likely provoked by bee sting/wasp sting - She received epinephrine  prior to arrival, symptoms improving per patient.  No drooling stridor or trismus, no angioedema.  She is HDS. - Give Pepcid , Solu-Medrol , IV fluids; low threshold to give repeat epinephrine  if it is warranted - symptoms continue to improve  Hypokalemia > - replaced orally   Clinical Course as of 04/09/24 1643  Wed Apr 09, 2024  1533 Symptoms improving  [SG]    Clinical Course User Index [SG] Elnor Jayson LABOR, DO    Patient with presumed anaphylaxis, recommend observation for 4 hours, handoff to Dr. Mannie pending period Of observation and reassessment                Additional history obtained: -Additional history obtained from family -External records from outside source obtained and reviewed including: Chart review including previous notes, labs, imaging, consultation notes including  Allergy  list, primary care documentation   Lab Tests: -I ordered,  reviewed, and interpreted labs.   The pertinent results include:   Labs Reviewed  CBC WITH DIFFERENTIAL/PLATELET - Abnormal; Notable for the following components:      Result Value   Lymphs Abs 5.5 (*)    All other components within normal limits  BASIC METABOLIC PANEL WITH GFR - Abnormal; Notable for the following components:   Potassium 3.3 (*)    CO2 20 (*)    Glucose, Bld 153 (*)    All other components within normal limits    Notable for hypokalemia  EKG   EKG Interpretation Date/Time:  Wednesday April 09 2024 14:54:46 EDT Ventricular Rate:  113 PR Interval:  156 QRS Duration:  89 QT Interval:  365 QTC Calculation: 427 R Axis:   -51  Text Interpretation: Sinus tachycardia Ventricular bigeminy Left anterior fascicular block no stemi Confirmed by Elnor Jayson ([id number]) on 04/09/2024 3:01:46 PM         Imaging Studies ordered: na   Medicines ordered and prescription drug management: Meds ordered this encounter  Medications   methylPREDNISolone  sodium succinate (SOLU-MEDROL ) 125 mg/2 mL injection 125 mg   famotidine  (PEPCID ) IVPB 20 mg premix   sodium chloride  0.9 % bolus 1,000 mL   potassium chloride  SA (KLOR-CON  M) CR tablet 40 mEq    -I have reviewed the patients home medicines and have made adjustments as needed   Consultations Obtained: na   Cardiac Monitoring: The patient was maintained on a cardiac monitor.  I personally viewed and interpreted the cardiac monitored which showed an underlying rhythm of: sinus brady Continuous pulse oximetry interpreted by myself, 98% on RA.    Social Determinants of Health:  Diagnosis or treatment significantly limited by social determinants of health: former smoker and obesity   Reevaluation: After the interventions noted above, I reevaluated the patient and found that they have improved  Co morbidities that complicate the patient evaluation  Past Medical History:  Diagnosis Date   Anxiety    Asthma    daily and  prn inhalers   Cervical disc disease    decreased range of motion   Chronic pain    shoulders, neck, lower back   Coronary artery disease    Deformity, hand    left   Depression    Eczema    Endometriosis    Lupron  injection Q 3 mos.   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    Final Clinical Impression(s) / ED Diagnoses Final diagnoses:  Anaphylaxis, initial encounter  Hypokalemia        Elnor Jayson LABOR, DO 04/09/24 1644

## 2024-04-09 NOTE — Discharge Instructions (Signed)
 Please follow-up with your primary doctor(s) within 2-3 days. Please avoid any known triggers of your allergies.  We have given you a prescription for an Epi-Pen. Please pick it up as soon as possible. Always carry this with you. Only use the Epi-Pen in the event of a severe allergic reaction with trouble breathing or throat swelling. You must go to the hospital right away if you ever use the Epi-Pen. Remember that they expire every year so you should have your doctor write a new prescription yearly. We have also given you a prescription for prednisone and an antihistamine. Please pick it up as soon as possible and use as directed.    Please return to the Emergency Department right away if you have any worsening or new shortness of breath, changes in your voice, tightness/itching in your mouth/throat, swelling, severe hives, chest pain, high fever. There is a very small chance of a recurrence of the allergic reaction, typically in the next 24 hours. If you see the same symptoms (rash, trouble breathing, vomiting, etc) return, come back to the Emergency Department immediately.   Please return to the emergency department immediately for any new or concerning symptoms, or if you get worse.

## 2024-04-09 NOTE — ED Triage Notes (Addendum)
 Pt bib [facility] for c/o anaphylactic rxn to wasp, stung on r hand, known allergy . Pt took 50mg  of benadryl  prior to EMS with no improvement. EMS gave 0.3 of epi x2 and 50 mg of benadryl  IV. Hives are improving feels like her chest is closing up. Has epi pen  20g r fa HR 90 140/90 100% NRB

## 2024-04-09 NOTE — ED Provider Notes (Signed)
  Physical Exam  BP 108/61   Pulse 88   Temp 98 F (36.7 C) (Oral)   Resp (!) 22   Ht 4' 11 (1.499 m)   Wt 78.9 kg   LMP 07/12/2016 (Approximate) Comment: 2-3 day cycle  SpO2 100%   BMI 35.14 kg/m   Physical Exam  Procedures  Procedures  ED Course / MDM   Clinical Course as of 04/09/24 1840  Wed Apr 09, 2024  1533 Symptoms improving  [SG]    Clinical Course User Index [SG] Elnor Jayson LABOR, DO   Medical Decision Making Amount and/or Complexity of Data Reviewed Labs: ordered.  Risk OTC drugs. Prescription drug management.   Wendy [name], assumed care for this patient.  In brief 58 year old female, signed out pending observation.  Following insect sting.  Patient does not have any significant swelling of the hand, no swelling of the throat, voice is normal and lungs are clear.  She is appropriate for discharge.  EpiPen  has been sent to her pharmacy.  Counseled and coached patient on how to use this.  Discharged.      [name], DO 04/09/24 [phone]

## 2024-04-11 ENCOUNTER — Other Ambulatory Visit (HOSPITAL_COMMUNITY): Payer: Self-pay

## 2024-04-11 ENCOUNTER — Other Ambulatory Visit: Payer: Self-pay

## 2024-04-11 ENCOUNTER — Telehealth: Payer: Self-pay | Admitting: Internal Medicine

## 2024-04-11 ENCOUNTER — Ambulatory Visit (INDEPENDENT_AMBULATORY_CARE_PROVIDER_SITE_OTHER): Admitting: Internal Medicine

## 2024-04-11 ENCOUNTER — Encounter: Payer: Self-pay | Admitting: Internal Medicine

## 2024-04-11 VITALS — BP 128/56 | HR 68 | Temp 97.6°F | Wt 178.4 lb

## 2024-04-11 DIAGNOSIS — Z91038 Other insect allergy status: Secondary | ICD-10-CM | POA: Diagnosis not present

## 2024-04-11 DIAGNOSIS — K219 Gastro-esophageal reflux disease without esophagitis: Secondary | ICD-10-CM

## 2024-04-11 DIAGNOSIS — L501 Idiopathic urticaria: Secondary | ICD-10-CM | POA: Diagnosis not present

## 2024-04-11 DIAGNOSIS — J310 Chronic rhinitis: Secondary | ICD-10-CM | POA: Diagnosis not present

## 2024-04-11 DIAGNOSIS — T6391XD Toxic effect of contact with unspecified venomous animal, accidental (unintentional), subsequent encounter: Secondary | ICD-10-CM | POA: Diagnosis not present

## 2024-04-11 MED ORDER — PANTOPRAZOLE SODIUM 20 MG PO TBEC
20.0000 mg | DELAYED_RELEASE_TABLET | Freq: Every day | ORAL | 5 refills | Status: DC
  Filled 2024-04-11: qty 30, 30d supply, fill #0

## 2024-04-11 MED ORDER — FLUTICASONE PROPIONATE 50 MCG/ACT NA SUSP
2.0000 | Freq: Every day | NASAL | 5 refills | Status: DC
Start: 2024-04-11 — End: 2024-07-15
  Filled 2024-04-11: qty 16, 30d supply, fill #0

## 2024-04-11 MED ORDER — FEXOFENADINE HCL 180 MG PO TABS
180.0000 mg | ORAL_TABLET | Freq: Two times a day (BID) | ORAL | 5 refills | Status: DC | PRN
  Filled 2024-04-11: qty 60, 30d supply, fill #0

## 2024-04-11 MED ORDER — AZELASTINE HCL 0.1 % NA SOLN
1.0000 | Freq: Two times a day (BID) | NASAL | 5 refills | Status: DC | PRN
  Filled 2024-04-11: qty 30, 90d supply, fill #0

## 2024-04-11 NOTE — Patient Instructions (Addendum)
 Chronic Rhinitis: - Positive skin test 08/2022: none - Use nasal saline rinses before nose sprays such as with Neilmed Sinus Rinse.  Use distilled water.   - Use Flonase  2 sprays each nostril daily. Aim upward and outward. - Use Azelastine  2 sprays each nostril twice daily as needed for congestion, drainage, runny nose. Aim upward and outward. - Use Allegra  180mg  daily.    GERD: -Continue Protonix  20mg  daily.  Take this on an empty stomach.  Can try weaning and then stopping if you are doing well for several months but if symptoms restart, get back on it.  -Avoid lying down for at least two hours after a meal or after drinking acidic beverages, like soda, or other caffeinated beverages. This can help to prevent stomach contents from flowing back into the esophagus. -Keep your head elevated while you sleep. Using an extra pillow or two can also help to prevent reflux. -Eat smaller and more frequent meals each day instead of a few large meals. This promotes digestion and can aid in preventing heartburn. -Wear loose-fitting clothes to ease pressure on the stomach, which can worsen heartburn and reflux. -Reduce excess weight around the midsection. This can ease pressure on the stomach. Such pressure can force some stomach contents back up the esophagus.   Idiopathic Urticaria (Hives) - At this time etiology of hives and swelling is unknown. Hives can be caused by a variety of different triggers including illness/infection, exercise, pressure, vibrations, extremes of temperature to name a few however majority of the time there is no identifiable trigger.  -Continue Allegra  180mg  daily.   -If no improvement in 2-3 days. increase to Allegra  180mg  twice daily.   -If no improvement in 2-3 days, add Pepcid  20mg  twice daily and continue Allegra  180mg  twice daily.  Concern For Stinging Insect Allergy : - based on today's history, will obtain labs for stinging insects.  Please get labs done in 6 weeks at  [facility] or our office (call before).   - for SKIN (hives/swelling) only reaction, okay to take Benadryl  25mg  capsules every 6 hours as needed - for SKIN + ANY additional symptoms, OR IF concern for LIFE THREATENING reaction = Epipen  Autoinjector EpiPen  0.3 mg. - practice avoidance measures as outlined below when possible

## 2024-04-11 NOTE — Progress Notes (Signed)
 FOLLOW UP Date of Service/Encounter:  04/11/24   Subjective:  Wendy [name] (DOB: [birth year]) is a 58 y.o. female who [facility] for follow up for chronic rhinitis, idiopathic urticaria, GERD, stinging insect reaction.   History obtained from: chart review and patient. Last visit was with me on 11/20/2023 and at the time, rhinitis/gERD were controlled on PPI daily, Azelastine  PRN.  Discussed Allegra /Pepcid  for uncontrolled hives. Only with hx of local reaction to stings.   Seen in ED 04/09/2024 with hives, facial swelling including tongue, difficulty swallowing, trouble breathing after wasp sting. Slight bradycardia and normal BP in ED. Diffuse urticaria noted on exam.  Given solumedrol, pepcid , NS. Already had received Epi.  Symptoms have since resolved. Has an Epipen .  Does note frequent runny nose and drainage. Using Flonase  few times a week, rarely Azelastine . Also on Allegra  daily.  Hives are doing well on daily Allegra , rarely has outbreaks  Does note reflux and heartburn are a lot better with PPI. Has not tried weaning it.   Past Medical History: Past Medical History:  Diagnosis Date   Anxiety    Asthma    daily and prn inhalers   Cervical disc disease    decreased range of motion   Chronic pain    shoulders, neck, lower back   Coronary artery disease    Deformity, hand    left   Depression    Eczema    Endometriosis    Lupron  injection Q 3 mos.   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 Physical Exam: GEN: alert, well developed HEENT: clear conjunctiva, nose without inferior turbinate hypertrophy, pink nasal mucosa, no rhinorrhea, + cobblestoning HEART: regular rate and rhythm, no murmur LUNGS: clear to auscultation bilaterally, no coughing, unlabored respiration SKIN: no rashes or lesions  Assessment:   1. Idiopathic urticaria   2. Chronic rhinitis   3. 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 Venom-induced anaphylaxis, accidental or unintentional, subsequent encounter   5. Gastroesophageal reflux disease, unspecified whether esophagitis present     Plan/Recommendations:   Chronic Rhinitis: - Uncontrolled, try to use Flonase  daily and if still symptomatic, add Azelastine .  - Positive skin test 08/2022: none - Use nasal saline rinses before nose sprays such as with Neilmed Sinus Rinse.  Use distilled water.   - Use Flonase  2 sprays each nostril daily. Aim upward and outward. - Use Azelastine  2 sprays each nostril twice daily as needed for congestion, drainage, runny nose. Aim upward and outward. - Use Allegra  180mg  daily.    GERD: - Controlled  -Continue Protonix  20mg  daily.  Take this on an empty stomach.  Can try weaning and then stopping if you are doing well for several months but if symptoms restart, get back on  it.  -Avoid lying down for at least two hours after a meal or after drinking acidic beverages, like soda, or other caffeinated beverages. This can help to prevent stomach contents from flowing back into the esophagus. -Keep your head elevated while you sleep. Using an extra pillow or two can also help to prevent reflux. -Eat smaller and more frequent meals each day instead of a few large meals. This promotes digestion and can aid in preventing heartburn. -Wear loose-fitting clothes to ease pressure on the stomach, which can worsen  heartburn and reflux. -Reduce excess weight around the midsection. This can ease pressure on the stomach. Such pressure can force some stomach contents back up the esophagus.   Idiopathic Urticaria (Hives) - Controlled  - At this time etiology of hives and swelling is unknown. Hives can be caused by a variety of different triggers including illness/infection, exercise, pressure, vibrations, extremes of temperature to name a few however majority of the time there is no identifiable trigger.  -Continue Allegra  180mg  daily.   -If no improvement in 2-3 days. increase to Allegra  180mg  twice daily.   -If no improvement in 2-3 days, add Pepcid  20mg  twice daily and continue Allegra  180mg  twice daily.  Concern For Stinging Insect Allergy : - based on today's history, will obtain labs for stinging insects.  Please get labs done in 6 weeks at [facility] or our office (call before).   - Initial rxn: 03/2024 with SOB/difficult swallowing/hives/facial swelling  - for SKIN (hives/swelling) only reaction, okay to take Benadryl  25mg  capsules every 6 hours as needed - for SKIN + ANY additional symptoms, OR IF concern for LIFE THREATENING reaction = Epipen  Autoinjector EpiPen  0.3 mg. - practice avoidance measures as outlined below when possible        Return in about 3 months (around 07/12/2024).  Arleta Blanch, MD [facility] 

## 2024-04-11 NOTE — Telephone Encounter (Signed)
  Pt c/o medication issue:  1. Name of Medication:   Semaglutide -Weight Management (WEGOVY ) 2.4 MG/0.75ML SOAJ    2. How are you currently taking this medication (dosage and times per day)?   Inject 2.4 mg into the skin once a week.    3. Are you having a reaction (difficulty breathing--STAT)? No   4. What is your medication issue? Pt is requesting to speak with pharmacist regarding this medication

## 2024-04-14 ENCOUNTER — Other Ambulatory Visit (HOSPITAL_COMMUNITY): Payer: Self-pay

## 2024-04-16 ENCOUNTER — Other Ambulatory Visit (HOSPITAL_COMMUNITY): Payer: Self-pay

## 2024-04-16 ENCOUNTER — Telehealth: Payer: Self-pay | Admitting: Pharmacy Technician

## 2024-04-16 NOTE — Telephone Encounter (Signed)
 Spoke with patient.  She has been on Wegovy  for over a  year and feels her weight loss has come to a halt.  Wondering if she would do better with Zepbound .    Patient has Medicare/Medicaid and a diagnosis of OSA.  Will have tech submit PA request to determine if we can get Zepbound  covered.

## 2024-04-16 NOTE — Telephone Encounter (Signed)
 From pt calls   Pharmacy Patient Advocate Encounter   Received notification from Pt Calls Messages that prior authorization for ZEPBOUND  2.5MG  is required/requested.   Insurance verification completed.   The patient is insured through [employer] .   Per test claim: PA required; PA submitted to above mentioned insurance via LATENT Key/confirmation #/EOC [facility] Status is pending

## 2024-04-17 ENCOUNTER — Other Ambulatory Visit (HOSPITAL_COMMUNITY): Payer: Self-pay

## 2024-04-17 DIAGNOSIS — I10 Essential (primary) hypertension: Secondary | ICD-10-CM | POA: Diagnosis not present

## 2024-04-17 DIAGNOSIS — T783XXA Angioneurotic edema, initial encounter: Secondary | ICD-10-CM | POA: Diagnosis not present

## 2024-04-17 DIAGNOSIS — M7989 Other specified soft tissue disorders: Secondary | ICD-10-CM | POA: Diagnosis not present

## 2024-04-17 MED ORDER — ZEPBOUND 7.5 MG/0.5ML ~~LOC~~ SOAJ
7.5000 mg | SUBCUTANEOUS | 0 refills | Status: DC
  Filled 2024-04-17: qty 2, 28d supply, fill #0

## 2024-04-17 NOTE — Telephone Encounter (Signed)
 Pharmacy Patient Advocate Encounter  Received notification from [facility] that Prior Authorization for zepbound  2.5mg  has been APPROVED from 04/17/24 to 09/10/24. Ran test claim, Copay is $0.00. This test claim was processed through [facility]- copay amounts may vary at other pharmacies due to pharmacy/plan contracts, or as the patient moves through the different stages of their insurance plan.

## 2024-04-17 NOTE — Addendum Note (Signed)
 Addended by: Raquel Sayres K on: 04/17/2024 03:51 PM   Modules accepted: Orders

## 2024-04-17 NOTE — Telephone Encounter (Signed)
 Spoke to pt inform about the approval. Has 2 more dose of Wegovy  2.4 mg will use that up and will start taking Zepbound  7.5 mg once a week. Will f/u in 4 weeks for further dose titration

## 2024-05-12 DIAGNOSIS — G4733 Obstructive sleep apnea (adult) (pediatric): Secondary | ICD-10-CM | POA: Diagnosis not present

## 2024-05-20 ENCOUNTER — Ambulatory Visit: Admitting: Internal Medicine

## 2024-05-28 ENCOUNTER — Other Ambulatory Visit: Payer: Self-pay | Admitting: Internal Medicine

## 2024-05-29 ENCOUNTER — Other Ambulatory Visit (HOSPITAL_COMMUNITY): Payer: Self-pay

## 2024-05-29 NOTE — Telephone Encounter (Signed)
 Tried to call patient for Zepbound  dose titration. No answer. Will send mychart message.

## 2024-05-29 NOTE — Telephone Encounter (Signed)
 Patient called back stating she is doing fine on the medication and dosage can be increased.

## 2024-05-30 ENCOUNTER — Other Ambulatory Visit (HOSPITAL_COMMUNITY): Payer: Self-pay

## 2024-05-30 ENCOUNTER — Encounter (HOSPITAL_COMMUNITY): Payer: Self-pay

## 2024-05-30 ENCOUNTER — Telehealth: Payer: Self-pay | Admitting: Internal Medicine

## 2024-05-30 ENCOUNTER — Other Ambulatory Visit: Payer: Self-pay | Admitting: Internal Medicine

## 2024-05-30 MED ORDER — ZEPBOUND 10 MG/0.5ML ~~LOC~~ SOAJ
10.0000 mg | SUBCUTANEOUS | 0 refills | Status: DC
  Filled 2024-05-30: qty 2, 28d supply, fill #0

## 2024-05-30 NOTE — Telephone Encounter (Signed)
 Spoke with pt regarding Zepbound . Pt stated she received a text from her pharmacy that stated Zepboud had been denied. Pt was confused because she has picked up the medication before. Pt stated the medication is not covered by insurance but is covered by a program with her CPAP. Pt was told her message would be sent to our pharmacists for assistance. Pt verbalized understanding. All questions if any were answered.

## 2024-05-30 NOTE — Telephone Encounter (Signed)
  Patient is calling because she was notified by the pharmacy that her Zepbound  was denied by provider. She states that they did not go through her insurance, they went through a program since she was new CPAP. She needs to know where she stands with being able to get her medication.

## 2024-06-02 ENCOUNTER — Other Ambulatory Visit (HOSPITAL_COMMUNITY): Payer: Self-pay

## 2024-06-02 NOTE — Telephone Encounter (Signed)
    Her refill for 7.5mg  was denied but a 10mg  was sent in and this is ready for free for her

## 2024-06-02 NOTE — Telephone Encounter (Signed)
 Patient informed about 10 mg Zepbound  prescription.

## 2024-06-02 NOTE — Progress Notes (Unsigned)
 Cardiology Office Note    Patient Name: Wendy [name] Date of Encounter: 06/02/2024  Primary Care Provider:  Elliot Charm, MD Primary Cardiologist:  Stanly DELENA Leavens, MD Primary Electrophysiologist: None   Past Medical History    Past Medical History:  Diagnosis Date   Anxiety    Asthma    daily and prn inhalers   Cervical disc disease    decreased range of motion   Chronic pain    shoulders, neck, lower back   Coronary artery disease    Deformity, hand    left   Depression    Eczema    Endometriosis    Lupron  injection Q 3 mos.   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  Wendy [name] was last seen on 2//25 for 38-month follow-up.  At her visit she reported GI upset with Wegovy  and had her dose adjusted.  She contacted our office on 11/26/2023 with complaint of chest pain and palpitations.  She wore a a ZIO monitor that showed sinus rhythm with no evidence of arrhythmia.  She contacted our office and reported a plateau with weight loss on Wegovy  and was switched to Zepbound .  Patient denies chest pain, palpitations, dyspnea, PND, orthopnea, nausea, vomiting, dizziness, syncope, edema, weight gain, or early satiety.   Discussed the use of AI scribe software for clinical note transcription with the patient, who gave verbal consent to proceed.  History of Present Illness    ***Notes:   Review of Systems  Please see the history of  present illness.    All other systems reviewed and are otherwise negative except as noted above.  Physical Exam    Wt Readings from Last 3 Encounters:  04/11/24 178 lb 6.4 oz (80.9 kg)  04/09/24 174 lb (78.9 kg)  01/15/24 176 lb (79.8 kg)   CD:Uyzmz were no vitals filed for this visit.,There is no height or weight on file to calculate BMI. GEN: Well nourished, well developed in no acute distress Neck: No JVD; No carotid bruits Pulmonary: Clear to auscultation without rales, wheezing or rhonchi  Cardiovascular: Normal rate. Regular rhythm. Normal S1. Normal S2.   Murmurs: There is no murmur.  ABDOMEN: Soft, non-tender, non-distended EXTREMITIES:  No edema; No deformity   EKG/LABS/ Recent Cardiac Studies   ECG personally reviewed by me today - ***  Risk Assessment/Calculations:   {Does this patient have ATRIAL FIBRILLATION?:[phone]}      Lab Results  Component Value Date   WBC 9.6 04/09/2024   HGB 12.6 04/09/2024   HCT 39.4 04/09/2024   MCV 88.3 04/09/2024   PLT 303 04/09/2024   Lab Results  Component Value Date   CREATININE 0.79 04/09/2024   BUN 10 04/09/2024   NA 138 04/09/2024   K 3.3 (L) 04/09/2024   CL 108 04/09/2024   CO2 20 (L) 04/09/2024   Lab Results  Component Value Date   CHOL 131 03/03/2022   HDL 86 03/03/2022   LDLCALC 35 03/03/2022   TRIG 40 03/03/2022   CHOLHDL 1.5 03/03/2022    Lab Results  Component Value Date   HGBA1C 5.0 07/15/2020   Assessment & Plan    Assessment and Plan Assessment & Plan     1. Coronary artery disease: -s/p CAD s/p NSTEMI 2021 treated with PCI/DES to 99% mRCA and 80% OM -Today patient reports  2.Essential hypertension: -Patient's blood pressure today was  3.History of COPD: -Most recent PFT showed moderate restriction. -Currently followed by pulmonology on CPAP therapy  4.Nonrheumatic AI: -Most recent 2D echo completed 01/2023 stable MR and AI plan to repeat in 1 year around 01/2024 -Continue Lasix  20  mg PRN   5.Hyperlipidemia: -Patient's LDL cholesterol was at goal at 35 -Continue Crestor  40 mg daily and Repatha          Disposition: Follow-up with Stanly DELENA Leavens, MD or APP in *** months {Are you ordering a CV Procedure (e.g. stress test, cath, DCCV, TEE, etc)?   Press F2        :[id number]}   Signed, Wyn Raddle, Jackee Shove, NP 06/02/2024, 9:18 AM [facility]

## 2024-06-03 ENCOUNTER — Other Ambulatory Visit (HOSPITAL_COMMUNITY): Payer: Self-pay

## 2024-06-03 ENCOUNTER — Ambulatory Visit: Attending: Nurse Practitioner | Admitting: Nurse Practitioner

## 2024-06-03 ENCOUNTER — Encounter: Payer: Self-pay | Admitting: Nurse Practitioner

## 2024-06-03 VITALS — BP 110/68 | HR 81 | Ht 59.0 in | Wt 174.4 lb

## 2024-06-03 DIAGNOSIS — I2510 Atherosclerotic heart disease of native coronary artery without angina pectoris: Secondary | ICD-10-CM

## 2024-06-03 DIAGNOSIS — I340 Nonrheumatic mitral (valve) insufficiency: Secondary | ICD-10-CM | POA: Diagnosis not present

## 2024-06-03 DIAGNOSIS — E782 Mixed hyperlipidemia: Secondary | ICD-10-CM | POA: Diagnosis not present

## 2024-06-03 DIAGNOSIS — I10 Essential (primary) hypertension: Secondary | ICD-10-CM | POA: Diagnosis not present

## 2024-06-03 DIAGNOSIS — Z8709 Personal history of other diseases of the respiratory system: Secondary | ICD-10-CM

## 2024-06-03 MED ORDER — POTASSIUM CHLORIDE CRYS ER 20 MEQ PO TBCR
20.0000 meq | EXTENDED_RELEASE_TABLET | Freq: Every day | ORAL | 3 refills | Status: AC
  Filled 2024-06-03: qty 90, 90d supply, fill #0

## 2024-06-03 MED ORDER — ROSUVASTATIN CALCIUM 40 MG PO TABS
40.0000 mg | ORAL_TABLET | Freq: Every day | ORAL | 3 refills | Status: AC
  Filled 2024-06-03: qty 90, 90d supply, fill #0

## 2024-06-03 MED ORDER — NITROGLYCERIN 0.4 MG SL SUBL
SUBLINGUAL_TABLET | SUBLINGUAL | 3 refills | Status: AC
  Filled 2024-06-03: qty 25, 8d supply, fill #0
  Filled 2024-07-01: qty 25, 8d supply, fill #1

## 2024-06-03 MED ORDER — FUROSEMIDE 20 MG PO TABS
10.0000 mg | ORAL_TABLET | Freq: Every day | ORAL | 3 refills | Status: AC | PRN
  Filled 2024-06-03: qty 45, 90d supply, fill #0

## 2024-06-03 MED ORDER — ISOSORBIDE MONONITRATE ER 60 MG PO TB24
60.0000 mg | ORAL_TABLET | Freq: Every day | ORAL | 3 refills | Status: AC
  Filled 2024-06-03: qty 90, 90d supply, fill #0

## 2024-06-03 MED ORDER — DILTIAZEM HCL ER COATED BEADS 120 MG PO CP24
120.0000 mg | ORAL_CAPSULE | Freq: Every day | ORAL | 3 refills | Status: AC
  Filled 2024-06-03: qty 90, 90d supply, fill #0

## 2024-06-03 NOTE — Patient Instructions (Signed)
 Medication Instructions:  Your physician recommends that you continue on your current medications as directed. Please refer to the Current Medication list given to you today. *If you need a refill on your cardiac medications before your next appointment, please call your pharmacy*  Lab Work: None ordered If you have labs (blood work) drawn today and your tests are completely normal, you will receive your results only by: MyChart Message (if you have MyChart) OR A paper copy in the mail If you have any lab test that is abnormal or we need to change your treatment, we will call you to review the results.  Testing/Procedures: Your physician has requested that you have an echocardiogram. Echocardiography is a painless test that uses sound waves to create images of your heart. It provides your doctor with information about the size and shape of your heart and how well your heart's chambers and valves are working. This procedure takes approximately one hour. There are no restrictions for this procedure. Please do NOT wear cologne, perfume, aftershave, or lotions (deodorant is allowed). Please arrive 15 minutes prior to your appointment time.  Please note: We ask at that you not bring children with you during ultrasound (echo/ vascular) testing. Due to room size and safety concerns, children are not allowed in the ultrasound rooms during exams. Our front office staff cannot provide observation of children in our lobby area while testing is being conducted. An adult accompanying a patient to their appointment will only be allowed in the ultrasound room at the discretion of the ultrasound technician under special circumstances. We apologize for any inconvenience.  Follow-Up: At [facility], you and your health needs are our priority.  As part of our continuing mission to provide you with exceptional heart care, our providers are all part of one team.  This team includes your primary Cardiologist  (physician) and Advanced Practice Providers or APPs (Physician Assistants and Nurse Practitioners) who all work together to provide you with the care you need, when you need it.  Your next appointment:   6 month(s)  Provider:   Stanly DELENA Leavens, MD or APP   We recommend signing up for the patient portal called MyChart.  Sign up information is provided on this After Visit Summary.  MyChart is used to connect with patients for Virtual Visits (Telemedicine).  Patients are able to view lab/test results, encounter notes, upcoming appointments, etc.  Non-urgent messages can be sent to your provider as well.   To learn more about what you can do with MyChart, go to [email or web address].   Other Instructions

## 2024-06-06 DIAGNOSIS — M25539 Pain in unspecified wrist: Secondary | ICD-10-CM | POA: Diagnosis not present

## 2024-06-06 DIAGNOSIS — Z23 Encounter for immunization: Secondary | ICD-10-CM | POA: Diagnosis not present

## 2024-06-10 ENCOUNTER — Other Ambulatory Visit (HOSPITAL_COMMUNITY): Payer: Self-pay

## 2024-06-12 ENCOUNTER — Other Ambulatory Visit (HOSPITAL_COMMUNITY): Payer: Self-pay

## 2024-06-30 ENCOUNTER — Telehealth: Payer: Self-pay | Admitting: Internal Medicine

## 2024-06-30 DIAGNOSIS — Z91038 Other insect allergy status: Secondary | ICD-10-CM | POA: Diagnosis not present

## 2024-06-30 DIAGNOSIS — T6391XD Toxic effect of contact with unspecified venomous animal, accidental (unintentional), subsequent encounter: Secondary | ICD-10-CM | POA: Diagnosis not present

## 2024-06-30 NOTE — Telephone Encounter (Signed)
 Wendy [name] wanted me to let you know she could not send you a picture through My Chart, so she is sending it to the main email at [email or web address] instead.

## 2024-07-01 ENCOUNTER — Other Ambulatory Visit: Payer: Self-pay | Admitting: Internal Medicine

## 2024-07-01 DIAGNOSIS — G4733 Obstructive sleep apnea (adult) (pediatric): Secondary | ICD-10-CM | POA: Diagnosis not present

## 2024-07-02 ENCOUNTER — Other Ambulatory Visit: Payer: Self-pay

## 2024-07-02 ENCOUNTER — Other Ambulatory Visit (HOSPITAL_COMMUNITY): Payer: Self-pay

## 2024-07-02 DIAGNOSIS — G4733 Obstructive sleep apnea (adult) (pediatric): Secondary | ICD-10-CM | POA: Diagnosis not present

## 2024-07-02 MED ORDER — ZEPBOUND 12.5 MG/0.5ML ~~LOC~~ SOAJ
12.5000 mg | SUBCUTANEOUS | 0 refills | Status: DC
  Filled 2024-07-02: qty 2, 28d supply, fill #0

## 2024-07-02 NOTE — Addendum Note (Signed)
 Addended by: Lorma Heater D on: 07/02/2024 03:05 PM   Modules accepted: Orders

## 2024-07-03 ENCOUNTER — Other Ambulatory Visit (HOSPITAL_COMMUNITY): Payer: Self-pay

## 2024-07-03 ENCOUNTER — Ambulatory Visit (HOSPITAL_COMMUNITY)
Admission: RE | Admit: 2024-07-03 | Discharge: 2024-07-03 | Disposition: A | Discharge status: Home / Self Care | Source: Ambulatory Visit | Attending: Vascular Surgery | Admitting: Vascular Surgery

## 2024-07-03 DIAGNOSIS — I340 Nonrheumatic mitral (valve) insufficiency: Secondary | ICD-10-CM | POA: Diagnosis not present

## 2024-07-03 DIAGNOSIS — E782 Mixed hyperlipidemia: Secondary | ICD-10-CM | POA: Diagnosis not present

## 2024-07-03 DIAGNOSIS — Z8709 Personal history of other diseases of the respiratory system: Secondary | ICD-10-CM | POA: Diagnosis not present

## 2024-07-03 DIAGNOSIS — I2510 Atherosclerotic heart disease of native coronary artery without angina pectoris: Secondary | ICD-10-CM | POA: Diagnosis not present

## 2024-07-03 DIAGNOSIS — I10 Essential (primary) hypertension: Secondary | ICD-10-CM | POA: Insufficient documentation

## 2024-07-03 LAB — ECHOCARDIOGRAM COMPLETE
Area-P 1/2: 4.39 cm2
MV M vel: 5.45 m/s
MV Peak grad: 118.8 mmHg
P 1/2 time: 731 ms
S' Lateral: 3.32 cm

## 2024-07-03 LAB — HYMENOPTERA VENOM ALLERGY II

## 2024-07-04 ENCOUNTER — Ambulatory Visit (HOSPITAL_COMMUNITY): Payer: Self-pay | Admitting: Nurse Practitioner

## 2024-07-05 LAB — HYMENOPTERA VENOM ALLERGY II
Bumblebee: 1.79 kU/L — AB
Hornet, White Face, IgE: 9.76 kU/L — AB
Hornet, Yellow, IgE: 4.55 kU/L — AB
I001-IgE Honeybee: 5.55 kU/L — AB
I003-IgE Yellow Jacket: 26.1 kU/L — AB
I004-IgE Paper Wasp: 15.5 kU/L — AB
I208-IgE Api m 1: <0.1 kU/L
I209-IgE Ves v 5: 17.7 kU/L — AB
I210-IgE Pol d 5: 16.9 kU/L — AB
I211-IgE Ves v 1: 9.5 kU/L — AB
I214-IgE Api m 2: <0.1 kU/L
I215-IgE Api m 3: <0.1 kU/L
I216-IgE Api m 5: 19 kU/L — AB
I217-IgE Api m 10: <0.1 kU/L
Tryptase: 11.2 ug/L (ref 2.2–13.2)

## 2024-07-05 LAB — ALLERGEN COMPONENT COMMENTS

## 2024-07-15 ENCOUNTER — Ambulatory Visit: Payer: Self-pay | Admitting: Internal Medicine

## 2024-07-15 ENCOUNTER — Encounter: Payer: Self-pay | Admitting: Internal Medicine

## 2024-07-15 ENCOUNTER — Ambulatory Visit (INDEPENDENT_AMBULATORY_CARE_PROVIDER_SITE_OTHER): Admitting: Internal Medicine

## 2024-07-15 ENCOUNTER — Other Ambulatory Visit: Payer: Self-pay

## 2024-07-15 ENCOUNTER — Other Ambulatory Visit (HOSPITAL_COMMUNITY): Payer: Self-pay

## 2024-07-15 VITALS — BP 122/66 | HR 92 | Temp 98.5°F | Wt 174.4 lb

## 2024-07-15 DIAGNOSIS — T6391XD Toxic effect of contact with unspecified venomous animal, accidental (unintentional), subsequent encounter: Secondary | ICD-10-CM

## 2024-07-15 DIAGNOSIS — L501 Idiopathic urticaria: Secondary | ICD-10-CM

## 2024-07-15 DIAGNOSIS — K219 Gastro-esophageal reflux disease without esophagitis: Secondary | ICD-10-CM | POA: Diagnosis not present

## 2024-07-15 DIAGNOSIS — J310 Chronic rhinitis: Secondary | ICD-10-CM

## 2024-07-15 MED ORDER — FEXOFENADINE HCL 180 MG PO TABS
180.0000 mg | ORAL_TABLET | Freq: Two times a day (BID) | ORAL | 5 refills | Status: AC | PRN
  Filled 2024-07-15: qty 60, 30d supply, fill #0

## 2024-07-15 MED ORDER — AZELASTINE HCL 0.1 % NA SOLN
1.0000 | Freq: Two times a day (BID) | NASAL | 5 refills | Status: AC | PRN
  Filled 2024-07-15: qty 30, 30d supply, fill #0

## 2024-07-15 MED ORDER — PANTOPRAZOLE SODIUM 20 MG PO TBEC
20.0000 mg | DELAYED_RELEASE_TABLET | Freq: Every day | ORAL | 5 refills | Status: AC
  Filled 2024-07-15: qty 30, 30d supply, fill #0

## 2024-07-15 MED ORDER — FLUTICASONE PROPIONATE 50 MCG/ACT NA SUSP
2.0000 | Freq: Every day | NASAL | 5 refills | Status: AC
  Filled 2024-07-15: qty 16, 30d supply, fill #0

## 2024-07-15 MED ORDER — HYDROCORTISONE 2.5 % EX CREA
TOPICAL_CREAM | Freq: Two times a day (BID) | CUTANEOUS | 5 refills | Status: AC
  Filled 2024-07-15: qty 30, 5d supply, fill #0

## 2024-07-15 NOTE — Progress Notes (Signed)
 FOLLOW UP Date of Service/Encounter:  07/15/24   Subjective:  Wendy [name] (DOB: June 23, 1966) is a 58 y.o. female who [facility] for follow up for urticaria, stinging insect allergy , chronic rhinitis and GERD.  Of note, she has COPD, followed by Pulm.  History obtained from: chart review and patient. Last seen by me on 04/11/2024 CR- uncontrolled, add nasal sprays GERD- controlled on PPI, try reducing/stopping CIU- controlled on allegra /pepcid  Obtain labs for stinging insect allergy .   Reports some runny nose and drainage with weather change but overall doing okay. Using Flonase  and Allegra  daily.  Has not tried Azelastine .    Few episodes of hives, nothing bothersome.  Has not increased her Allegra  dosing or needed to add pepcid .  Reflux is doing well, no heartburn, sour taste.  Did not try weaning or stopping PPI as she is afraid.    No other stings since last visit.  Has her Epipen .  Will think about venom shots.  Does note having an itchy raised rash on scalp that started after using a hair dye.  Has happened before. The rash has since resolved but still some itching. Also notes some redness and itching at site of her injections- repatha  and zepbound .    Past Medical History: Past Medical History:  Diagnosis Date   Anxiety    Asthma    daily and prn inhalers   Cervical disc disease    decreased range of motion   Chronic pain    shoulders, neck, lower back   Coronary artery disease    Deformity, hand    left   Depression    Eczema    Endometriosis    Lupron  injection Q 3 mos.   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 Physical Exam: GEN: alert, well developed HEENT: clear conjunctiva, nose with mild inferior turbinate hypertrophy, pink nasal mucosa, no rhinorrhea, + cobblestoning HEART: regular rate and rhythm, no murmur LUNGS: clear to auscultation bilaterally, no coughing, unlabored respiration SKIN: small area of erythema on right lower abdomen   Assessment:   1. Venom-induced anaphylaxis, accidental or unintentional, subsequent encounter   2. Idiopathic urticaria   3. Chronic rhinitis   4. Gastroesophageal reflux disease, unspecified whether esophagitis present     Plan/Recommendations:  Chronic Rhinitis: - Improved but still symptomatic, add Azelastine  PRN. - Positive skin test 08/2022: none - Use nasal saline rinses before nose sprays such as with Neilmed Sinus Rinse.  Use distilled water.   - Use Flonase  2 sprays each nostril daily. Aim upward and outward. - Use Azelastine  2 sprays each nostril twice daily as needed for congestion, drainage, runny nose. Aim upward and outward. - Use Allegra  180mg  daily.     GERD: - Controlled  -Continue Protonix  20mg  daily.  Take this on an empty stomach.  Can try weaning and then stopping if you are doing well for several months but if symptoms restart, get back on it.  -  Avoid lying down for at least two hours after a meal or after drinking acidic beverages, like soda, or other caffeinated beverages. This can help to prevent stomach contents from flowing back into the esophagus. -Keep your head elevated while you sleep. Using an extra pillow or two can also help to prevent reflux. -Eat smaller and more frequent meals each day instead of a few large meals. This promotes digestion and can aid in preventing heartburn. -Wear loose-fitting clothes to ease pressure on the stomach, which can worsen heartburn and reflux. -Reduce excess weight around the  midsection. This can ease pressure on the stomach. Such pressure can force some stomach contents back up the esophagus.    Idiopathic Urticaria (Hives) - Controlled  - At this time etiology of hives and swelling is unknown. Hives can be caused by a variety of different triggers including illness/infection, exercise, pressure, vibrations, extremes of temperature to name a few however majority of the time there is no identifiable trigger.  -Continue Allegra  180mg  daily.   -If no improvement in 2-3 days. increase to Allegra  180mg  twice daily.   -If no improvement in 2-3 days, add Pepcid  20mg  twice daily and continue Allegra  180mg  twice daily.   Stinging Insect Allergy : - consider venom immunotherapy - Initial rxn: 03/2024 with SOB/difficult swallowing/hives/facial swelling  - sIgE 06/2024: positive to honeybee, wasps, yellow jacket, hornets.  - for SKIN (hives/swelling) only reaction, okay to take Benadryl  25mg  capsules every 6 hours as needed - for SKIN + ANY additional symptoms, OR IF concern for LIFE THREATENING reaction = Epipen  Autoinjector EpiPen  0.3 mg. - practice avoidance measures as outlined below when possible    Local Reaction to Injections - Use hydrocortisone  2.5% twice daily as needed for itching.  Maximum use 5 days.  - Use Allegra  180mg  twice daily for 3-5 days.  Reaction to Hair dye - Avoid use of hair dye that is causing rashes. If recurrent can consider patch testing.     Return in about 6 months (around 01/12/2025).  Arleta Blanch, MD [facility] [place] 

## 2024-07-15 NOTE — Patient Instructions (Addendum)
 Chronic Rhinitis: - Positive skin test 08/2022: none - Use nasal saline rinses before nose sprays such as with Neilmed Sinus Rinse.  Use distilled water.   - Use Flonase  2 sprays each nostril daily. Aim upward and outward. - Use Azelastine  2 sprays each nostril twice daily as needed for congestion, drainage, runny nose. Aim upward and outward. - Use Allegra  180mg  daily.     GERD: -Continue Protonix  20mg  daily.  Take this on an empty stomach.  Can try weaning and then stopping if you are doing well for several months but if symptoms restart, get back on it.  -Avoid lying down for at least two hours after a meal or after drinking acidic beverages, like soda, or other caffeinated beverages. This can help to prevent stomach contents from flowing back into the esophagus. -Keep your head elevated while you sleep. Using an extra pillow or two can also help to prevent reflux. -Eat smaller and more frequent meals each day instead of a few large meals. This promotes digestion and can aid in preventing heartburn. -Wear loose-fitting clothes to ease pressure on the stomach, which can worsen heartburn and reflux. -Reduce excess weight around the midsection. This can ease pressure on the stomach. Such pressure can force some stomach contents back up the esophagus.    Idiopathic Urticaria (Hives) - At this time etiology of hives and swelling is unknown. Hives can be caused by a variety of different triggers including illness/infection, exercise, pressure, vibrations, extremes of temperature to name a few however majority of the time there is no identifiable trigger.  -Continue Allegra  180mg  daily.   -If no improvement in 2-3 days. increase to Allegra  180mg  twice daily.   -If no improvement in 2-3 days, add Pepcid  20mg  twice daily and continue Allegra  180mg  twice daily.   Stinging Insect Allergy : - consider venom immunotherapy - for SKIN (hives/swelling) only reaction, okay to take Benadryl  25mg  capsules  every 6 hours as needed - for SKIN + ANY additional symptoms, OR IF concern for LIFE THREATENING reaction = Epipen  Autoinjector EpiPen  0.3 mg. - practice avoidance measures as outlined below when possible    Local Reaction to Injections - Use hydrocortisone  2.5% twice daily as needed for itching.  Maximum use 5 days.  - Use Allegra  180mg  twice daily for 3-5 days.  Reaction to Hair dye - Avoid use of hair dye that is causing rashes. If recurrent can consider patch testing.

## 2024-07-25 ENCOUNTER — Other Ambulatory Visit (HOSPITAL_COMMUNITY): Payer: Self-pay

## 2024-08-04 ENCOUNTER — Ambulatory Visit (HOSPITAL_COMMUNITY)
Admission: EM | Admit: 2024-08-04 | Discharge: 2024-08-04 | Disposition: A | Discharge status: Home / Self Care | Attending: Family Medicine | Admitting: Family Medicine

## 2024-08-04 ENCOUNTER — Other Ambulatory Visit (HOSPITAL_COMMUNITY): Payer: Self-pay

## 2024-08-04 ENCOUNTER — Encounter (HOSPITAL_COMMUNITY): Payer: Self-pay

## 2024-08-04 DIAGNOSIS — J069 Acute upper respiratory infection, unspecified: Secondary | ICD-10-CM

## 2024-08-04 DIAGNOSIS — J4521 Mild intermittent asthma with (acute) exacerbation: Secondary | ICD-10-CM | POA: Diagnosis not present

## 2024-08-04 LAB — POC SOFIA SARS ANTIGEN FIA: SARS Coronavirus 2 Ag: NEGATIVE

## 2024-08-04 LAB — POCT INFLUENZA A/B
Influenza A, POC: NEGATIVE
Influenza B, POC: NEGATIVE

## 2024-08-04 MED ORDER — BENZONATATE 100 MG PO CAPS
100.0000 mg | ORAL_CAPSULE | Freq: Three times a day (TID) | ORAL | 0 refills | Status: AC | PRN
  Filled 2024-08-04: qty 21, 7d supply, fill #0

## 2024-08-04 MED ORDER — PREDNISONE 20 MG PO TABS
40.0000 mg | ORAL_TABLET | Freq: Every day | ORAL | 0 refills | Status: AC
  Filled 2024-08-04: qty 10, 5d supply, fill #0

## 2024-08-04 NOTE — Discharge Instructions (Signed)
 The testing for flu and COVID was negative.  This is most likely some other virus causing your symptoms  Take prednisone  20 mg--2 daily for 5 days  Take benzonatate  100 mg, 1 tab every 8 hours as needed for cough.  Continue using your albuterol  inhaler--do 2 puffs every 4 hours as needed for shortness of breath or wheezing

## 2024-08-04 NOTE — ED Triage Notes (Signed)
 Patient here today with c/o cough, ST, fever, nasal congestion, chills, body aches, SOB, and rash on her chest and arms since yesterday morning. Patient has been taking Coricidin, Vicks Vapor Rub, ginger tea, and green tea with some relief. Her neighbor was also sick and recently went on a cruise to the [place].

## 2024-08-04 NOTE — ED Provider Notes (Signed)
 [facility]    CSN: [id number] Arrival date & time: 08/04/24  9195      History   Chief Complaint Chief Complaint  Patient presents with   Cough    HPI Wendy [name] is a 58 y.o. female.    Cough Here for cough, nasal congestion , sore throat, fever to 102, chills and myalgia. No n/v/d. Has also had some shortness of breath and wheezing.  Does have a h/o asthma.  NKDA, though has lots of food allergies, including shellfish/IVP dye    Past Medical History:  Diagnosis Date   Anxiety    Asthma    daily and prn inhalers   Cervical disc disease    decreased range of motion   Chronic pain    shoulders, neck, lower back   Coronary artery disease    Deformity, hand    left   Depression    Eczema    Endometriosis    Lupron  injection Q 3 mos.   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   Urticaria    Wears partial dentures    upper partial    Patient Active Problem List   Diagnosis Date Noted   Morbid obesity with BMI of 40.0-44.9, adult (HCC) 02/19/2023   Nonrheumatic aortic valve insufficiency 02/13/2022   Melena 01/24/2021   DOE (dyspnea on exertion) 01/24/2021   PVC (premature ventricular contraction) 10/14/2020   Coronary artery disease involving native coronary artery of native heart without angina pectoris 07/30/2020   Essential hypertension 07/30/2020   Hyperlipidemia 07/30/2020   Gastroesophageal reflux disease without esophagitis 07/30/2020   Nonrheumatic mitral valve regurgitation 07/30/2020   BV (bacterial vaginosis) 03/06/2013   ANXIETY 11/05/2007   DEPRESSION 11/05/2007   NAUSEA, CHRONIC 11/05/2007   FIBROIDS, UTERUS 03/26/2007   MIGRAINE, COMMON 03/26/2007   OVARIAN CYST 03/26/2007   DEGENERATIVE DISC DISEASE, CERVICAL  SPINE 03/26/2007   DEGENERATIVE DISC DISEASE, LUMBOSACRAL SPINE 03/26/2007   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    Past Surgical History:  Procedure Laterality Date   ANKLE RECONSTRUCTION  02/01/2012   Procedure: RECONSTRUCTION ANKLE;  Surgeon: Norleen Armor, MD;  Location: [facility];  Service: Orthopedics;  Laterality: Right;  Excision of right calcaneonavicular coalition with autologus fat graft interposition   BRACHIAL PLEXUS EXPLORATION     CARDIAC CATHETERIZATION     COLONOSCOPY WITH PROPOFOL  N/A 02/16/2023   Procedure: COLONOSCOPY WITH PROPOFOL ;  Surgeon: Rollin Dover, MD;  Location: [facility];  Service: Gastroenterology;  Laterality: N/A;   CORONARY STENT INTERVENTION N/A 07/15/2020   Procedure: CORONARY STENT INTERVENTION;  Surgeon: Dann Candyce RAMAN, MD;  Location: [facility] INVASIVE CV LAB;  Service: Cardiovascular;  Laterality: N/A;   DIAGNOSTIC LAPAROSCOPY  10/02/2008   peritoneal bx.   LEFT HEART CATH AND CORONARY ANGIOGRAPHY N/A 07/15/2020   Procedure: LEFT HEART CATH AND CORONARY ANGIOGRAPHY;  Surgeon: Dann Candyce RAMAN, MD;  Location: [facility]VAS[facility] CV LAB;  Service: Cardiovascular;  Laterality: N/A;   MULTIPLE TOOTH EXTRACTIONS     upper teeth and wisdom teeth   OVARIAN CYST REMOVAL  2000   RIB RESECTION     left - cervical rib removal   SHOULDER ARTHROSCOPY W/ ROTATOR CUFF REPAIR  07/2011   right   SHOULDER SURGERY  left   TOOTH EXTRACTION     x 1    OB History     Gravida  0   Para  0   Term  0   Preterm  0   AB  0   Living  0      SAB  0   IAB  0   Ectopic  0   Multiple  0   Live Births               Home Medications    Prior to Admission medications   Medication Sig Start Date End Date Taking? Authorizing Provider  benzonatate  (TESSALON ) 100 MG capsule Take 1 capsule (100 mg total) by mouth 3 (three) times daily as needed for cough. 08/04/24  Yes Vonna Sharlet POUR, MD  predniSONE  (DELTASONE ) 20 MG tablet Take 2 tablets (40 mg  total) by mouth daily with breakfast for 5 days. 08/04/24 08/09/24 Yes Vonna Sharlet POUR, MD  acetaminophen  (TYLENOL ) 325 MG tablet Take 2 tablets (650 mg total) by mouth every 6 (six) hours as needed. 10/01/20   Mesner, Selinda, MD  albuterol  (PROVENTIL  HFA;VENTOLIN  HFA) 108 (90 Base) MCG/ACT inhaler Inhale 2 puffs into the lungs every 4 (four) hours as needed for wheezing or shortness of breath (cough). 11/20/16   Street, [place], PA-C  aspirin  EC 81 MG EC tablet Take 1 tablet (81 mg total) by mouth daily. Swallow whole. 07/17/20   Bhagat, Aleene, PA  azelastine  (ASTELIN ) 0.1 % nasal spray Place 1 spray into both nostrils 2 (two) times daily as needed for rhinitis. 07/15/24   Tobie Arleta SQUIBB, MD  budesonide -formoterol  (SYMBICORT ) 80-4.5 MCG/ACT inhaler Inhale 2 puffs into the lungs once  daily. 08/02/23   Meade Verdon RAMAN, MD  busPIRone  (BUSPAR ) 5 MG tablet Take 1 tablet (5 mg total) by mouth 2 (two) times daily. 01/02/23     diltiazem  (CARTIA  XT) 120 MG 24 hr capsule Take 1 capsule (120 mg total) by mouth daily. 06/03/24 06/03/25  Wyn Jackee VEAR Mickey., NP  EPINEPHrine  (EPIPEN  2-PAK) 0.3 mg/0.3 mL IJ SOAJ injection Inject 0.3 mg into the muscle as needed for anaphylaxis. 04/09/24   Elnor Jayson LABOR, DO  Evolocumab  (REPATHA  SURECLICK) 140 MG/ML SOAJ Inject 140 mg into the skin every 14 (fourteen) days. 11/14/23   Santo Stanly LABOR, MD  fexofenadine  (ALLEGRA  ALLERGY ) 180 MG tablet Take 1 tablet (180 mg total) by mouth 2 (two) times daily as needed for allergies (hives). 07/15/24   Tobie Arleta SQUIBB, MD  fluticasone  (FLONASE ) 50 MCG/ACT nasal spray Place 2 sprays into both nostrils daily. 07/15/24   Tobie Arleta SQUIBB, MD  furosemide  (LASIX ) 20 MG tablet Take 1/2 tablet (10 mg total) by mouth daily as needed for fluid retention or swelling 06/03/24   Wyn Jackee VEAR Mickey., NP  gabapentin  (NEURONTIN ) 600 MG tablet Take 600 mg by mouth at bedtime.    [provider]  HYDROcodone -acetaminophen  (NORCO) 10-325 MG  tablet Take 1 tablet by mouth every 6 (six) hours as needed for pain. Patient taking differently: Take 1 tablet by mouth every 6 (six) hours as needed. 01/03/24     hydrocortisone  2.5 % cream Apply twice daily for itching after injections, maximum 5 days. 07/15/24   Tobie Arleta SQUIBB, MD  hydrOXYzine  (ATARAX /VISTARIL ) 25 MG tablet Take 25 mg by mouth 3 (three) times daily as needed for anxiety or itching.  10/09/19   [provider]  isosorbide  mononitrate (IMDUR ) 60 MG 24 hr tablet Take 1  tablet (60 mg total) by mouth daily. 06/03/24   Wyn Jackee VEAR Mickey., NP  ketoconazole  (NIZORAL ) 2 % cream Apply 1 application topically 2 (two) times daily for 14 days. Patient taking differently: Apply 1 application  topically daily as needed. 12/09/21     lidocaine  (XYLOCAINE ) 5 % ointment Apply 1 application topically as needed. 09/02/21   Bernis Ernst, PA-C  nitroGLYCERIN  (NITROSTAT ) 0.4 MG SL tablet PLACE 1 TABLET (0.4 MG TOTAL) UNDER THE TONGUE EVERY 5 (FIVE) MINUTES FOR 3 DOSES AS NEEDED FOR CHEST PAIN. 06/03/24   Wyn Jackee VEAR Mickey., NP  nystatin  cream (MYCOSTATIN ) Apply 1 Application topically as needed. 05/19/15   [provider]  oxyCODONE  (ROXICODONE ) 5 MG immediate release tablet Take 1 tablet (5 mg total) by mouth every 4 (four) hours as needed for severe pain. 06/02/22   Raford Lenis, MD  pantoprazole  (PROTONIX ) 20 MG tablet Take 1 tablet (20 mg total) by mouth daily. 07/15/24   Tobie Arleta SQUIBB, MD  polyethylene glycol (MIRALAX  / GLYCOLAX ) 17 g packet Take 17 g by mouth every other day. Patient taking differently: Take 17 g by mouth as needed.    [provider]  potassium chloride  SA (KLOR-CON  M) 20 MEQ tablet Take 1 tablet (20 mEq total) by mouth daily. 06/03/24   Wyn Jackee VEAR Mickey., NP  pregabalin  (LYRICA ) 100 MG capsule Take 1 capsule (100 mg total) by mouth 2 (two) times daily. 08/07/23     rosuvastatin  (CRESTOR ) 40 MG tablet Take 1 tablet (40 mg total) by mouth daily. 06/03/24   Wyn Jackee VEAR Mickey., NP  senna (SENOKOT) 8.6 MG TABS tablet Take 1 tablet by mouth daily as needed for moderate constipation.    [provider]  tirzepatide  (ZEPBOUND ) 12.5 MG/0.5ML Pen Inject 12.5 mg into the skin once a week. 07/02/24   Santo Stanly LABOR, MD  tiZANidine  (ZANAFLEX ) 4 MG tablet Take 1 tablet (4 mg total) by mouth every 6 (six) hours as needed for muscle spasms. 06/02/22   Raford Lenis, MD  triamcinolone  cream (KENALOG ) 0.1 % Apply 1 Application topically 2 (two) times daily. Patient taking differently: Apply 1 Application topically as needed. 07/12/22   Levander Houston, MD  valACYclovir  (VALTREX ) 1000 MG tablet Take 1 tablet (1,000 mg total) by mouth daily. Patient taking differently: Take 1 g by mouth as needed. 12/25/23       Family History Family History  Problem Relation Age of Onset   Heart failure Mother    Cancer Mother    COPD Mother    Dementia Father    Stroke Father    CAD Father    Heart failure Sister     Social History Social History   Tobacco Use   Smoking status: Former    Current packs/day: 0.00    Average packs/day: 1 pack/day for 25.0 years (25.0 ttl pk-yrs)    Types: Cigarettes    Start date: 07/15/1995    Quit date: 07/14/2020    Years since quitting: 4.0    Passive exposure: Never   Smokeless tobacco: Never  Vaping Use   Vaping status: Never Used  Substance Use Topics   Alcohol use: No    Alcohol/week: 0.0 standard drinks of alcohol    Comment: occasional   Drug use: No     Allergies   Pfizer-biontech covid-19 vacc [covid-19 mrna vacc (moderna)], Wasp venom, Chocolate, Iodine, Ivp dye [iodinated contrast media], Latex, Peanut (diagnostic), Shellfish allergy , Soap & cleansers, Eicosapentaenoic acid, Fish protein-containing  drug products, Peanut-containing drug products, and Soap   Review of Systems Review of Systems  Respiratory:  Positive for cough.      Physical Exam Triage Vital Signs ED Triage Vitals [08/04/24 0902]   Encounter Vitals Group     BP 115/67     Girls Systolic BP Percentile      Girls Diastolic BP Percentile      Boys Systolic BP Percentile      Boys Diastolic BP Percentile      Pulse Rate 97     Resp 16     Temp 99.7 F (37.6 C)     Temp Source Oral     SpO2 97 %     Weight      Height      Head Circumference      Peak Flow      Pain Score 9     Pain Loc      Pain Education      Exclude from Growth Chart    No data found.  Updated Vital Signs BP 115/67 (BP Location: Left Arm)   Pulse 97   Temp 99.7 F (37.6 C) (Oral)   Resp 16   LMP 07/12/2016 (Approximate) Comment: 2-3 day cycle  SpO2 97%   Visual Acuity Right Eye Distance:   Left Eye Distance:   Bilateral Distance:    Right Eye Near:   Left Eye Near:    Bilateral Near:     Physical Exam Vitals reviewed.  Constitutional:      General: She is not in acute distress.    Appearance: She is not ill-appearing, toxic-appearing or diaphoretic.  HENT:     Right Ear: Tympanic membrane and ear canal normal.     Left Ear: Tympanic membrane and ear canal normal.     Nose: Congestion present.     Mouth/Throat:     Mouth: Mucous membranes are moist.     Comments: There is a lot of white exudate draining in the oropharynx.  No erythema.  Mucous membranes are moist and pink Eyes:     Extraocular Movements: Extraocular movements intact.     Conjunctiva/sclera: Conjunctivae normal.     Pupils: Pupils are equal, round, and reactive to light.  Cardiovascular:     Rate and Rhythm: Normal rate and regular rhythm.     Heart sounds: No murmur heard. Pulmonary:     Effort: No respiratory distress.     Breath sounds: No stridor. No rhonchi or rales.     Comments: There are a few end expiratory wheezes heard.  Air movement is good Musculoskeletal:     Cervical back: Neck supple.  Lymphadenopathy:     Cervical: No cervical adenopathy.  Skin:    Capillary Refill: Capillary refill takes less than 2 seconds.     Coloration:  Skin is not jaundiced or pale.  Neurological:     General: No focal deficit present.     Mental Status: She is alert and oriented to person, place, and time.  Psychiatric:        Behavior: Behavior normal.      UC Treatments / Results  Labs (all labs ordered are listed, but only abnormal results are displayed) Labs Reviewed  POCT INFLUENZA A/B  POC SOFIA SARS ANTIGEN FIA    EKG   Radiology No results found.  Procedures Procedures (including critical care time)  Medications Ordered in UC Medications - No data to display  Initial Impression / Assessment and  Plan / UC Course  I have reviewed the triage vital signs and the nursing notes.  Pertinent labs & imaging results that were available during my care of the patient were reviewed by me and considered in my medical decision making (see chart for details).     Testing for flu and COVID is negative.  Prednisone  is sent in for the asthma exacerbation and Tessalon  Perles are sent in for cough. Final Clinical Impressions(s) / UC Diagnoses   Final diagnoses:  Viral URI  Mild intermittent asthma with (acute) exacerbation     Discharge Instructions      The testing for flu and COVID was negative.  This is most likely some other virus causing your symptoms  Take prednisone  20 mg--2 daily for 5 days  Take benzonatate  100 mg, 1 tab every 8 hours as needed for cough.  Continue using your albuterol  inhaler--do 2 puffs every 4 hours as needed for shortness of breath or wheezing      ED Prescriptions     Medication Sig Dispense Auth. Provider   predniSONE  (DELTASONE ) 20 MG tablet Take 2 tablets (40 mg total) by mouth daily with breakfast for 5 days. 10 tablet Vonna Sharlet POUR, MD   benzonatate  (TESSALON ) 100 MG capsule Take 1 capsule (100 mg total) by mouth 3 (three) times daily as needed for cough. 21 capsule Lylian Sanagustin K, MD      PDMP not reviewed this encounter.   Vonna Sharlet POUR, MD 08/04/24  [phone]

## 2024-08-05 ENCOUNTER — Other Ambulatory Visit (HOSPITAL_COMMUNITY): Payer: Self-pay

## 2024-08-05 ENCOUNTER — Other Ambulatory Visit: Payer: Self-pay

## 2024-08-05 MED ORDER — BUDESONIDE-FORMOTEROL FUMARATE 80-4.5 MCG/ACT IN AERO
2.0000 | INHALATION_SPRAY | Freq: Every day | RESPIRATORY_TRACT | 1 refills | Status: AC
  Filled 2024-08-05: qty 10.2, 60d supply, fill #0

## 2024-08-05 MED ORDER — ALBUTEROL SULFATE HFA 108 (90 BASE) MCG/ACT IN AERS
2.0000 | INHALATION_SPRAY | Freq: Four times a day (QID) | RESPIRATORY_TRACT | 3 refills | Status: AC | PRN
  Filled 2024-08-05: qty 6.7, 25d supply, fill #0

## 2024-08-05 MED ORDER — AMOXICILLIN 500 MG PO CAPS
500.0000 mg | ORAL_CAPSULE | Freq: Three times a day (TID) | ORAL | 0 refills | Status: AC
  Filled 2024-08-05: qty 21, 7d supply, fill #0

## 2024-08-06 ENCOUNTER — Other Ambulatory Visit (HOSPITAL_COMMUNITY): Payer: Self-pay

## 2024-08-11 ENCOUNTER — Telehealth: Payer: Self-pay | Admitting: Internal Medicine

## 2024-08-11 NOTE — Telephone Encounter (Signed)
 Pt called stating her both her legs have been numb. She denies any pain, or symptoms anywhere else. She asked if this could be related to heart. Please advise.

## 2024-08-11 NOTE — Telephone Encounter (Signed)
 Spoke with patient. She reports her feet stay numb a lot. This occurs daily, after standing or sitting, intermittent, at random times. Episodes can last - 1 hour. She has known back pain with difficulty standing erect. She reports her feet are cold. When she was employed, she was standing a lot. She is not diabetic. She takes gabapentin  HS, prescribed by neurosurgery.   Per chart review, she has been evaluated by Dr. Mavis and Ozell Baker (?PT) for spondylosis, low back pain. Advised sounds like could be radiating pain from underlying spine/ortho concerns.   Will route to cardiologist as FYI

## 2024-08-14 ENCOUNTER — Other Ambulatory Visit (HOSPITAL_COMMUNITY): Payer: Self-pay

## 2024-08-14 ENCOUNTER — Other Ambulatory Visit: Payer: Self-pay | Admitting: Internal Medicine

## 2024-08-15 ENCOUNTER — Other Ambulatory Visit: Payer: Self-pay | Admitting: Internal Medicine

## 2024-08-15 ENCOUNTER — Other Ambulatory Visit (HOSPITAL_COMMUNITY): Payer: Self-pay

## 2024-08-18 ENCOUNTER — Other Ambulatory Visit: Payer: Self-pay | Admitting: Internal Medicine

## 2024-08-18 ENCOUNTER — Other Ambulatory Visit (HOSPITAL_COMMUNITY): Payer: Self-pay

## 2024-08-18 MED ORDER — ZEPBOUND 12.5 MG/0.5ML ~~LOC~~ SOAJ
12.5000 mg | SUBCUTANEOUS | 0 refills | Status: AC
  Filled 2024-08-18: qty 2, 28d supply, fill #0

## 2024-08-20 ENCOUNTER — Other Ambulatory Visit (HOSPITAL_COMMUNITY): Payer: Self-pay

## 2024-08-20 ENCOUNTER — Emergency Department (HOSPITAL_COMMUNITY)
Admission: EM | Admit: 2024-08-20 | Discharge: 2024-08-21 | Discharge status: Against Medical Advice | Attending: Emergency Medicine | Admitting: Emergency Medicine

## 2024-08-20 ENCOUNTER — Telehealth: Payer: Self-pay | Admitting: Internal Medicine

## 2024-08-20 DIAGNOSIS — R220 Localized swelling, mass and lump, head: Secondary | ICD-10-CM | POA: Insufficient documentation

## 2024-08-20 DIAGNOSIS — M545 Low back pain, unspecified: Secondary | ICD-10-CM | POA: Insufficient documentation

## 2024-08-20 DIAGNOSIS — Z5321 Procedure and treatment not carried out due to patient leaving prior to being seen by health care provider: Secondary | ICD-10-CM | POA: Insufficient documentation

## 2024-08-20 NOTE — Telephone Encounter (Signed)
 Left a message to call back.

## 2024-08-20 NOTE — Telephone Encounter (Signed)
°  Per MyChart scheduling message:   Pt c/o Shortness Of Breath: STAT if SOB developed within the last 24 hours or pt is noticeably SOB on the phone  1. Are you currently SOB (can you hear that pt is SOB on the phone)?   2. How long have you been experiencing SOB?   3. Are you SOB when sitting or when up moving around?   4. Are you currently experiencing any other symptoms?    Yes, I am short of breath. I cant walk but a short distance and my right hand is having numbness as well as both foots. Pain on my right side back. This been going on off and on for a few months now

## 2024-08-20 NOTE — Telephone Encounter (Signed)
 Pt reports was told by PCP to contact Cardiologist for c/o bilateral feet and right arm numbness.  Reports started about a week ago.  Symptom comes and goes.   Pt reports BP 91/57 at last OV with PCP 2 days ago.  Reports at home runs 118/60's.  Pt has had SOB for 2-3 weeks.    Pt reports completed Amoxicillin  for URI yesterday.  Noted coughing over the phone.  Pt also has breathing treatments twice daily.  Pt reports PCP ordered Chest xray on Monday and has not received results.  Advised pt to contact PCP as symptoms could likely be related to URI.   Pt denies weight gain or swelling to body.  Again advised pt to contact PCP regarding SOB this is not uncommon with URI.  Pt expresses understanding.  Will send message to MD to address feet and right arm numbness.

## 2024-08-21 ENCOUNTER — Emergency Department (HOSPITAL_COMMUNITY)
Admission: EM | Admit: 2024-08-21 | Discharge: 2024-08-21 | Disposition: A | Discharge status: Home / Self Care | Attending: Emergency Medicine | Admitting: Emergency Medicine

## 2024-08-21 ENCOUNTER — Other Ambulatory Visit: Payer: Self-pay

## 2024-08-21 DIAGNOSIS — L731 Pseudofolliculitis barbae: Secondary | ICD-10-CM

## 2024-08-21 DIAGNOSIS — Z7982 Long term (current) use of aspirin: Secondary | ICD-10-CM | POA: Insufficient documentation

## 2024-08-21 DIAGNOSIS — R220 Localized swelling, mass and lump, head: Secondary | ICD-10-CM | POA: Diagnosis not present

## 2024-08-21 NOTE — ED Triage Notes (Addendum)
 Pt reports she was sitting at home, playing with her hair and noticed she had a bump on the right side of her head that's tender to touch. Denies blurry vision or headache. Pt also reports lower back pain radiating to the front that started last night.

## 2024-08-21 NOTE — ED Notes (Signed)
 PT given warm compress per Dr. Carlyle instruction

## 2024-08-21 NOTE — ED Provider Notes (Signed)
 [place] EMERGENCY DEPARTMENT AT [facility] Provider Note   CSN: [id number] Arrival date & time: 08/21/24  0413     Patient presents with: Facial Swelling   Wendy [name] is a 58 y.o. female.   The history is provided by the patient.  Illness Location:  Scalp Quality:  Tender area Severity:  Mild Onset quality:  Sudden Duration: less than 1 hour. Timing:  Constant Progression:  Unchanged Chronicity:  New Context:  None no trauma no wounds Relieved by:  Nothing Worsened by:  Nothing Ineffective treatments:  None tried Associated symptoms: no fever, no rash and no vomiting   Risk factors:  None      Prior to Admission medications  Medication Sig Start Date End Date Taking? Authorizing Provider  acetaminophen  (TYLENOL ) 325 MG tablet Take 2 tablets (650 mg total) by mouth every 6 (six) hours as needed. 10/01/20   Mesner, Selinda, MD  albuterol  (PROVENTIL  HFA;VENTOLIN  HFA) 108 (90 Base) MCG/ACT inhaler Inhale 2 puffs into the lungs every 4 (four) hours as needed for wheezing or shortness of breath (cough). 11/20/16   Street, Manvel, PA-C  albuterol  (VENTOLIN  HFA) 108 (90 Base) MCG/ACT inhaler Inhale 2 puffs into the lungs every 6 (six) hours as needed. 08/05/24     amoxicillin  (AMOXIL ) 500 MG capsule Take 1 capsule (500 mg total) by mouth every 8 (eight) hours. 08/05/24     aspirin  EC 81 MG EC tablet Take 1 tablet (81 mg total) by mouth daily. Swallow whole. 07/17/20   Bhagat, Aleene, PA  azelastine  (ASTELIN ) 0.1 % nasal spray Place 1 spray into both nostrils 2 (two) times daily as needed for rhinitis. 07/15/24   Tobie Arleta SQUIBB, MD  benzonatate  (TESSALON ) 100 MG capsule Take 1 capsule (100 mg total) by mouth 3 (three) times daily as needed for cough. 08/04/24   Vonna Sharlet POUR, MD  budesonide -formoterol  (SYMBICORT ) 80-4.5 MCG/ACT inhaler Inhale 2 puffs into the lungs once  daily. 08/02/23   Meade Verdon RAMAN, MD  budesonide -formoterol  (SYMBICORT ) 80-4.5  MCG/ACT inhaler Inhale 2 puffs into the lungs daily. 08/05/24     busPIRone  (BUSPAR ) 5 MG tablet Take 1 tablet (5 mg total) by mouth 2 (two) times daily. 01/02/23     diltiazem  (CARTIA  XT) 120 MG 24 hr capsule Take 1 capsule (120 mg total) by mouth daily. 06/03/24 06/03/25  Wyn Jackee VEAR Mickey., NP  EPINEPHrine  (EPIPEN  2-PAK) 0.3 mg/0.3 mL IJ SOAJ injection Inject 0.3 mg into the muscle as needed for anaphylaxis. 04/09/24   Elnor Jayson LABOR, DO  Evolocumab  (REPATHA  SURECLICK) 140 MG/ML SOAJ Inject 140 mg into the skin every 14 (fourteen) days. 11/14/23   Santo Stanly LABOR, MD  fexofenadine  (ALLEGRA  ALLERGY ) 180 MG tablet Take 1 tablet (180 mg total) by mouth 2 (two) times daily as needed for allergies (hives). 07/15/24   Tobie Arleta SQUIBB, MD  fluticasone  (FLONASE ) 50 MCG/ACT nasal spray Place 2 sprays into both nostrils daily. 07/15/24   Tobie Arleta SQUIBB, MD  furosemide  (LASIX ) 20 MG tablet Take 1/2 tablet (10 mg total) by mouth daily as needed for fluid retention or swelling 06/03/24   Wyn Jackee VEAR Mickey., NP  gabapentin  (NEURONTIN ) 600 MG tablet Take 600 mg by mouth at bedtime.    [provider]  HYDROcodone -acetaminophen  (NORCO) 10-325 MG tablet Take 1 tablet by mouth every 6 (six) hours as needed for pain. Patient taking differently: Take 1 tablet by mouth every 6 (six) hours as needed. 01/03/24  hydrocortisone  2.5 % cream Apply twice daily for itching after injections, maximum 5 days. 07/15/24   Tobie Arleta SQUIBB, MD  hydrOXYzine  (ATARAX /VISTARIL ) 25 MG tablet Take 25 mg by mouth 3 (three) times daily as needed for anxiety or itching.  10/09/19   [provider]  isosorbide  mononitrate (IMDUR ) 60 MG 24 hr tablet Take 1 tablet (60 mg total) by mouth daily. 06/03/24   Wyn Jackee VEAR Mickey., NP  ketoconazole  (NIZORAL ) 2 % cream Apply 1 application topically 2 (two) times daily for 14 days. Patient taking differently: Apply 1 application  topically daily as needed. 12/09/21     lidocaine  (XYLOCAINE )  5 % ointment Apply 1 application topically as needed. 09/02/21   Bernis Ernst, PA-C  nitroGLYCERIN  (NITROSTAT ) 0.4 MG SL tablet PLACE 1 TABLET (0.4 MG TOTAL) UNDER THE TONGUE EVERY 5 (FIVE) MINUTES FOR 3 DOSES AS NEEDED FOR CHEST PAIN. 06/03/24   Wyn Jackee VEAR Mickey., NP  nystatin  cream (MYCOSTATIN ) Apply 1 Application topically as needed. 05/19/15   [provider]  oxyCODONE  (ROXICODONE ) 5 MG immediate release tablet Take 1 tablet (5 mg total) by mouth every 4 (four) hours as needed for severe pain. 06/02/22   Raford Lenis, MD  pantoprazole  (PROTONIX ) 20 MG tablet Take 1 tablet (20 mg total) by mouth daily. 07/15/24   Tobie Arleta SQUIBB, MD  polyethylene glycol (MIRALAX  / GLYCOLAX ) 17 g packet Take 17 g by mouth every other day. Patient taking differently: Take 17 g by mouth as needed.    [provider]  potassium chloride  SA (KLOR-CON  M) 20 MEQ tablet Take 1 tablet (20 mEq total) by mouth daily. 06/03/24   Wyn Jackee VEAR Mickey., NP  pregabalin  (LYRICA ) 100 MG capsule Take 1 capsule (100 mg total) by mouth 2 (two) times daily. 08/07/23     rosuvastatin  (CRESTOR ) 40 MG tablet Take 1 tablet (40 mg total) by mouth daily. 06/03/24   Wyn Jackee VEAR Mickey., NP  senna (SENOKOT) 8.6 MG TABS tablet Take 1 tablet by mouth daily as needed for moderate constipation.    [provider]  tirzepatide  (ZEPBOUND ) 12.5 MG/0.5ML Pen Inject 12.5 mg into the skin once a week. 08/18/24   Santo Stanly LABOR, MD  tiZANidine  (ZANAFLEX ) 4 MG tablet Take 1 tablet (4 mg total) by mouth every 6 (six) hours as needed for muscle spasms. 06/02/22   Raford Lenis, MD  triamcinolone  cream (KENALOG ) 0.1 % Apply 1 Application topically 2 (two) times daily. Patient taking differently: Apply 1 Application topically as needed. 07/12/22   Levander Houston, MD  valACYclovir  (VALTREX ) 1000 MG tablet Take 1 tablet (1,000 mg total) by mouth daily. Patient taking differently: Take 1 g by mouth as needed. 12/25/23       Allergies:  Pfizer-biontech covid-19 vacc [covid-19 mrna vacc (moderna)], Wasp venom, Chocolate, Iodine, Ivp dye [iodinated contrast media], Latex, Peanut (diagnostic), Shellfish allergy , Soap & cleansers, Eicosapentaenoic acid, Fish protein-containing drug products, Peanut-containing drug products, and Soap    Review of Systems  Constitutional:  Negative for fever.  Gastrointestinal:  Negative for vomiting.  Musculoskeletal:  Negative for neck pain and neck stiffness.  Skin:  Negative for rash.  All other systems reviewed and are negative.   Updated Vital Signs BP 118/77 (BP Location: Right Arm)   Pulse 87   Temp 97.9 F (36.6 C) (Oral)   Resp 18   Ht 4' 11 (1.499 m)   Wt 79.4 kg   LMP 07/12/2016 Comment: 2-3 day cycle  SpO2 100%  BMI 35.35 kg/m   Physical Exam Vitals and nursing note reviewed. Exam conducted with a chaperone present.  Constitutional:      General: Wendy [name] is not in acute distress.    Appearance: Normal appearance. Wendy [name] is well-developed.  HENT:     Head: Normocephalic and atraumatic.      Nose: Nose normal.  Eyes:     Pupils: Pupils are equal, round, and reactive to light.  Cardiovascular:     Rate and Rhythm: Normal rate and regular rhythm.     Pulses: Normal pulses.     Heart sounds: Normal heart sounds.  Pulmonary:     Effort: Pulmonary effort is normal. No respiratory distress.     Breath sounds: Normal breath sounds.  Abdominal:     General: Bowel sounds are normal. There is no distension.     Palpations: Abdomen is soft.     Tenderness: There is no abdominal tenderness. There is no guarding or rebound.  Musculoskeletal:        General: Normal range of motion.     Cervical back: Neck supple.  Skin:    General: Skin is warm and dry.     Capillary Refill: Capillary refill takes less than 2 seconds.     Findings: No erythema or rash.  Neurological:     General: No focal deficit present.     Mental Status: Wendy [name] is alert and oriented to person, place, and  time.  Psychiatric:        Mood and Affect: Mood normal.     (all labs ordered are listed, but only abnormal results are displayed) Labs Reviewed - No data to display  EKG: None  Radiology: No results found.   Procedures   Medications Ordered in the ED - No data to display                                  Medical Decision Making Tenderness of skin feels raised   Amount and/or Complexity of Data Reviewed External Data Reviewed: notes.    Details: Previous notes reviewed   Risk Risk Details: Area is consistent with non abscessed ingrown hair.  Tylenol  and ibuprofen  and warm compresses.  Stable for discharge.      Final diagnoses:  Ingrown hair  The patient is nontoxic-appearing on exam and vital signs are within normal limits.  I have reviewed the triage vital signs and the nursing notes. Pertinent labs & imaging results that were available during my [name] of the patient were reviewed by me and considered in my medical decision making (see chart for details). After history, exam, and medical workup I feel the patient has been appropriately medically screened and is safe for discharge home. Pertinent diagnoses were discussed with the patient. Patient was given return precautions.   ED Discharge Orders     None          Aysha Livecchi, MD 08/21/24 [phone]

## 2024-08-21 NOTE — ED Notes (Signed)
 Pt left AMA

## 2024-08-21 NOTE — ED Triage Notes (Signed)
 Patient brought in by POV with c/o right sided facial swelling that's closer to scalp. Patient alert and oriented x4, denies any trauma

## 2024-08-25 NOTE — Telephone Encounter (Signed)
 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  I am glad she is getting PCP assessment for both.  This also may explain hypotension.  Wishing her a speedy recovery.   Thanks, MAC  Pt had no further questions or concern.

## 2024-08-27 ENCOUNTER — Other Ambulatory Visit: Payer: Self-pay

## 2024-08-27 ENCOUNTER — Other Ambulatory Visit (HOSPITAL_COMMUNITY): Payer: Self-pay

## 2024-08-27 MED ORDER — EPINEPHRINE 0.3 MG/0.3ML IJ SOAJ
0.3000 mg | INTRAMUSCULAR | 1 refills | Status: AC | PRN
  Filled 2024-08-27: qty 2, 15d supply, fill #0

## 2024-08-27 MED ORDER — ALBUTEROL SULFATE HFA 108 (90 BASE) MCG/ACT IN AERS
2.0000 | INHALATION_SPRAY | Freq: Four times a day (QID) | RESPIRATORY_TRACT | 3 refills | Status: AC | PRN
  Filled 2024-08-27: qty 6.7, 25d supply, fill #0

## 2024-08-27 MED ORDER — ROSUVASTATIN CALCIUM 40 MG PO TABS
40.0000 mg | ORAL_TABLET | Freq: Every day | ORAL | 5 refills | Status: AC
  Filled 2024-08-27: qty 90, 90d supply, fill #0

## 2024-08-27 MED ORDER — EPINEPHRINE 0.15 MG/0.15ML IJ SOAJ: INTRAMUSCULAR | 3 refills | Status: DC

## 2024-08-29 ENCOUNTER — Other Ambulatory Visit (HOSPITAL_COMMUNITY): Payer: Self-pay

## 2024-09-15 ENCOUNTER — Other Ambulatory Visit (HOSPITAL_COMMUNITY): Payer: Self-pay

## 2024-09-18 ENCOUNTER — Other Ambulatory Visit (HOSPITAL_COMMUNITY): Payer: Self-pay

## 2024-09-18 ENCOUNTER — Other Ambulatory Visit: Payer: Self-pay | Admitting: Internal Medicine

## 2024-09-18 DIAGNOSIS — E782 Mixed hyperlipidemia: Secondary | ICD-10-CM

## 2024-09-18 DIAGNOSIS — I2510 Atherosclerotic heart disease of native coronary artery without angina pectoris: Secondary | ICD-10-CM

## 2024-09-18 MED ORDER — REPATHA SURECLICK 140 MG/ML ~~LOC~~ SOAJ
140.0000 mg | SUBCUTANEOUS | 3 refills | Status: AC
  Filled 2024-09-18: qty 6, 84d supply, fill #0

## 2024-09-18 NOTE — Telephone Encounter (Signed)
*  STAT* If patient is at the pharmacy, call can be transferred to refill team.   1. Which medications need to be refilled? (please list name of each medication and dose if known) Evolocumab  (REPATHA  SURECLICK) 140 MG/ML SOAJ    2. Would you like to learn more about the convenience, safety, & potential cost savings by using the [facility]?      3. Are you open to using the [facility] (Type [facility].  ).   4. Which pharmacy/location (including street and city if local pharmacy) is medication to be sent to? [place] [facility]    5. Do they need a 30 day or 90 day supply? 90 day
